# Patient Record
Sex: Female | Born: 1960 | Race: White | Hispanic: No | Marital: Married | Smoking: Former smoker
Health system: Southern US, Community
[De-identification: ages and names within clinical notes are randomized; demographics above are authoritative.]

## PROBLEM LIST (undated history)

## (undated) DIAGNOSIS — F419 Anxiety disorder, unspecified: Secondary | ICD-10-CM

## (undated) DIAGNOSIS — F329 Major depressive disorder, single episode, unspecified: Secondary | ICD-10-CM

## (undated) DIAGNOSIS — Z436 Encounter for attention to other artificial openings of urinary tract: Secondary | ICD-10-CM

## (undated) DIAGNOSIS — F32A Depression, unspecified: Secondary | ICD-10-CM

## (undated) DIAGNOSIS — E079 Disorder of thyroid, unspecified: Secondary | ICD-10-CM

## (undated) DIAGNOSIS — M1990 Unspecified osteoarthritis, unspecified site: Secondary | ICD-10-CM

## (undated) DIAGNOSIS — F4310 Post-traumatic stress disorder, unspecified: Secondary | ICD-10-CM

## (undated) DIAGNOSIS — D179 Benign lipomatous neoplasm, unspecified: Secondary | ICD-10-CM

## (undated) DIAGNOSIS — B1920 Unspecified viral hepatitis C without hepatic coma: Secondary | ICD-10-CM

## (undated) DIAGNOSIS — F1910 Other psychoactive substance abuse, uncomplicated: Secondary | ICD-10-CM

## (undated) DIAGNOSIS — F410 Panic disorder [episodic paroxysmal anxiety] without agoraphobia: Secondary | ICD-10-CM

## (undated) DIAGNOSIS — G8929 Other chronic pain: Secondary | ICD-10-CM

## (undated) HISTORY — DX: Disorder of thyroid, unspecified: E07.9

## (undated) HISTORY — DX: Depression, unspecified: F32.A

## (undated) HISTORY — DX: Other chronic pain: G89.29

## (undated) HISTORY — DX: Other psychoactive substance abuse, uncomplicated: F19.10

## (undated) HISTORY — DX: Unspecified viral hepatitis C without hepatic coma: B19.20

## (undated) HISTORY — DX: Anxiety disorder, unspecified: F41.9

## (undated) HISTORY — PX: ABDOMINAL HYSTERECTOMY: SHX81

## (undated) HISTORY — PX: HERNIA REPAIR: SHX51

## (undated) HISTORY — PX: OTHER SURGICAL HISTORY: SHX169

## (undated) HISTORY — DX: Post-traumatic stress disorder, unspecified: F43.10

## (undated) HISTORY — DX: Panic disorder (episodic paroxysmal anxiety): F41.0

## (undated) HISTORY — DX: Major depressive disorder, single episode, unspecified: F32.9

## (undated) HISTORY — PX: ABDOMINAL SURGERY: SHX537

## (undated) HISTORY — DX: Unspecified osteoarthritis, unspecified site: M19.90

## (undated) HISTORY — PX: ILEO LOOP CONDUIT: SHX1779

## (undated) HISTORY — PX: CHOLECYSTECTOMY: SHX55

## (undated) HISTORY — PX: VAGINA RECONSTRUCTION SURGERY: SHX828

## (undated) HISTORY — DX: Benign lipomatous neoplasm, unspecified: D17.9

---
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
## 2011-12-01 ENCOUNTER — Encounter (HOSPITAL_COMMUNITY): Payer: Self-pay | Admitting: *Deleted

## 2011-12-01 ENCOUNTER — Emergency Department (HOSPITAL_COMMUNITY)
Admission: EM | Admit: 2011-12-01 | Discharge: 2011-12-01 | Disposition: A | Discharge status: Home / Self Care | Payer: Medicare Other | Attending: Emergency Medicine | Admitting: Emergency Medicine

## 2011-12-01 DIAGNOSIS — G8929 Other chronic pain: Secondary | ICD-10-CM | POA: Insufficient documentation

## 2011-12-01 DIAGNOSIS — Z90710 Acquired absence of both cervix and uterus: Secondary | ICD-10-CM | POA: Diagnosis not present

## 2011-12-01 DIAGNOSIS — F17200 Nicotine dependence, unspecified, uncomplicated: Secondary | ICD-10-CM | POA: Insufficient documentation

## 2011-12-01 DIAGNOSIS — N949 Unspecified condition associated with female genital organs and menstrual cycle: Secondary | ICD-10-CM | POA: Diagnosis not present

## 2011-12-01 HISTORY — DX: Encounter for attention to other artificial openings of urinary tract: Z43.6

## 2011-12-01 MED ORDER — HYDROCODONE-ACETAMINOPHEN 10-325 MG PO TABS: 1.0000 | ORAL_TABLET | Freq: Four times a day (QID) | ORAL | Status: AC | PRN

## 2011-12-01 MED ORDER — HYDROCODONE-ACETAMINOPHEN 5-325 MG PO TABS
2.0000 | ORAL_TABLET | Freq: Once | ORAL | Status: AC
  Administered 2011-12-01: 2 via ORAL
  Filled 2011-12-01: qty 2

## 2011-12-01 NOTE — ED Provider Notes (Signed)
History     CSN: [id number]  Arrival date & time 12/01/11  0436   First MD Initiated Contact with Patient 12/01/11 [phone]      Chief Complaint  Patient presents with  . Pelvic Pain    (Consider location/radiation/quality/duration/timing/severity/associated sxs/prior treatment) HPI  Kathleen Palmer is a 51 y.o. female who presents to the Emergency Department complaining of chronic pelvic pain. Patient has moved to [place] from [place] and is unable to find a doctor. 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 She is on hydrocodone 10/325, #6 a day. She had a month's supply of medicine when she arrived in [place] which is now finished.  No PCP  Past Medical History  Diagnosis Date  . Attention to urostomy     Past Surgical History  Procedure Date  . Abdominal surgery   . Abdominal hysterectomy     History reviewed. No pertinent family history.  History  Substance Use Topics  . Smoking status: Current Some Day Smoker -- 0.5 packs/day  . Smokeless tobacco: Not on file  . Alcohol Use: No    OB History    Grav Para Term Preterm Abortions TAB SAB Ect Mult Living                  Review of Systems  Constitutional: Negative for fever.       10 Systems reviewed and are negative for acute change except as noted in the HPI.  HENT: Negative for congestion.   Eyes: Negative for discharge and redness.  Respiratory: Negative for cough and shortness of breath.   Cardiovascular: Negative for chest pain.  Gastrointestinal: Negative for vomiting and abdominal pain.  Genitourinary: Positive for pelvic pain.  Musculoskeletal: Negative for back pain.  Skin: Negative for rash.  Neurological: Negative for syncope, numbness and headaches.  Psychiatric/Behavioral:       No behavior change.    Allergies  Ampicillin; Bactrim; and Gabapentin  Home Medications   Current Outpatient Rx  Name Route Sig Dispense Refill  . ALPRAZOLAM 1 MG PO TABS Oral Take 1 mg by mouth 3 (three) times daily as  needed.    Marland Kitchen FLUOXETINE HCL 20 MG PO TABS Oral Take 60 mg by mouth daily.    Marland Kitchen HYDROCODONE-ACETAMINOPHEN 10-325 MG PO TABS Oral Take 1 tablet by mouth every 4 (four) hours as needed.    Marland Kitchen MIRTAZAPINE 15 MG PO TABS Oral Take 15 mg by mouth at bedtime.      BP 145/86  Pulse 63  Temp 98.3 F (36.8 C) (Oral)  Resp 18  Ht 5\' 3"  (1.6 m)  Wt 192 lb (87.091 kg)  BMI 34.01 kg/m2  SpO2 98%  Physical Exam  Nursing note and vitals reviewed. Constitutional:       Awake, alert, nontoxic appearance.  HENT:  Head: Atraumatic.  Eyes: Right eye exhibits no discharge. Left eye exhibits no discharge.  Neck: Neck supple.  Cardiovascular: Normal heart sounds.   Pulmonary/Chest: Effort normal and breath sounds normal. She exhibits no tenderness.  Abdominal: Soft. There is no tenderness. There is no rebound.  Musculoskeletal: She exhibits no tenderness.       Baseline ROM, no obvious new focal weakness.  Neurological:       Mental status and motor strength appears baseline for patient and situation.  Skin: No rash noted.  Psychiatric: She has a normal mood and affect.    ED Course  Procedures (including critical care time)  MDM  Patient with chronic pain who has moved from [place], unable to find a doctor, out of medications. Analgesics given , Rx written. Pt stable in ED with no significant deterioration in condition.The patient appears reasonably screened and/or stabilized for discharge and I doubt any other medical condition or other [facility] requiring further screening, evaluation, or treatment in the ED at this time prior to discharge.  MDM Reviewed: nursing note and vitals           Nicoletta Dress. Colon Branch, MD 12/01/11 0530

## 2011-12-01 NOTE — ED Notes (Signed)
Pt reporting recently moving from [place], and been unable to find physician at that time.  Reports extensive medical history and chronic pain.  Pt reports she did have 1 month of pain medication but has been unable to see physician in that time.

## 2011-12-22 ENCOUNTER — Encounter (HOSPITAL_COMMUNITY): Payer: Self-pay | Admitting: *Deleted

## 2011-12-22 ENCOUNTER — Emergency Department (HOSPITAL_COMMUNITY)
Admission: EM | Admit: 2011-12-22 | Discharge: 2011-12-22 | Disposition: A | Discharge status: Home / Self Care | Payer: Medicare Other | Attending: Emergency Medicine | Admitting: Emergency Medicine

## 2011-12-22 DIAGNOSIS — N949 Unspecified condition associated with female genital organs and menstrual cycle: Secondary | ICD-10-CM | POA: Diagnosis not present

## 2011-12-22 DIAGNOSIS — R109 Unspecified abdominal pain: Secondary | ICD-10-CM | POA: Diagnosis not present

## 2011-12-22 DIAGNOSIS — F17200 Nicotine dependence, unspecified, uncomplicated: Secondary | ICD-10-CM | POA: Diagnosis not present

## 2011-12-22 DIAGNOSIS — G8929 Other chronic pain: Secondary | ICD-10-CM | POA: Insufficient documentation

## 2011-12-22 MED ORDER — HYDROCODONE-ACETAMINOPHEN 10-500 MG PO TABS: 1.0000 | ORAL_TABLET | Freq: Four times a day (QID) | ORAL | Status: AC | PRN

## 2011-12-22 MED ORDER — HYDROCODONE-ACETAMINOPHEN 5-500 MG PO TABS: 1.0000 | ORAL_TABLET | Freq: Four times a day (QID) | ORAL | Status: DC | PRN

## 2011-12-22 MED ORDER — MORPHINE SULFATE 10 MG/ML IJ SOLN
10.0000 mg | Freq: Once | INTRAMUSCULAR | Status: AC
  Administered 2011-12-22: 10 mg via INTRAMUSCULAR
  Filled 2011-12-22: qty 1

## 2011-12-22 MED ORDER — PROMETHAZINE HCL 25 MG/ML IJ SOLN
25.0000 mg | Freq: Once | INTRAMUSCULAR | Status: AC
  Administered 2011-12-22: 25 mg via INTRAMUSCULAR
  Filled 2011-12-22: qty 1

## 2011-12-22 NOTE — ED Notes (Addendum)
Patient with no complaints at this time. Respirations even and unlabored. Skin warm/dry. Discharge instructions reviewed with patient at this time. Patient given opportunity to voice concerns/ask questions.atient discharged at this time and left Emergency Department with steady gait.   

## 2011-12-22 NOTE — ED Provider Notes (Signed)
History  This chart was scribed for Geoffery Lyons, MD by Shari Heritage. The patient was seen in room APA09/APA09. [name] care was started at 1325.     CSN: [id number]  Arrival date & time 12/22/11  1325   First MD Initiated Contact with Patient 12/22/11 1355      Chief Complaint  Patient presents with  . Pelvic Pain    The history is provided by the patient and a friend. No language interpreter was used.   RANIYAH CURENTON is a 51 y.o. female who presents to the Emergency Department complaining of severe, constant pelvic pain. 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 She has had multiple abdominal surgeries. Patient recently moved to [place] from [place], and doesn't have a local PMD. Patient is on a waiting list with Dr. Margo Aye. Patient is a current some day smoker.  Patient was last seen in the ED on 12/05/2011 by Dr. Greta Doom for the same. Per ED chart note, she had a month's supply of medicine upon arrival in the ED, but ran out. Patient was screened and discharged in stable condition with prescription for 90 tablets of Norco 10-325 mg.   Past Medical History  Diagnosis Date  . Attention to urostomy     Past Surgical History  Procedure Date  . Abdominal surgery   . Abdominal hysterectomy     History reviewed. No pertinent family history.  History  Substance Use Topics  . Smoking status: Current Some Day Smoker -- 0.5 packs/day    Types: Cigarettes  . Smokeless tobacco: Not on file  . Alcohol Use: No    OB History    Grav Para Term Preterm Abortions TAB SAB Ect Mult Living                  Review of Systems  Genitourinary: Positive for pelvic pain.  All other systems reviewed and are negative.    Allergies  Ampicillin; Bactrim; and Gabapentin  Home Medications   Current Outpatient Rx  Name Route Sig Dispense Refill  . ALPRAZOLAM 1 MG PO TABS Oral Take 1 mg by mouth 3 (three) times daily as needed.    Marland Kitchen FLUOXETINE  HCL 20 MG PO TABS Oral Take 60 mg by mouth daily.    Marland Kitchen HYDROCODONE-ACETAMINOPHEN 10-325 MG PO TABS Oral Take 1 tablet by mouth every 4 (four) hours as needed.    Marland Kitchen MIRTAZAPINE 15 MG PO TABS Oral Take 15 mg by mouth at bedtime.      BP 123/80  Pulse 80  Temp 97.9 F (36.6 C) (Oral)  Resp 20  Ht 5\' 3"  (1.6 m)  Wt 193 lb (87.544 kg)  BMI 34.19 kg/m2  SpO2 98%  Physical Exam  Constitutional: She is oriented to person, place, and time. She appears well-developed and well-nourished.       Patient was tearful.  HENT:  Head: Normocephalic and atraumatic.  Pulmonary/Chest: Effort normal.  Abdominal:       Patient has urostomy bag in place. Several abdominal surgical scars present.  Neurological: She is alert and oriented to person, place, and time.  Skin: Skin is warm and dry.  Psychiatric: She has a normal mood and affect. Her behavior is normal.    ED Course  Procedures (including critical care time) DIAGNOSTIC STUDIES: Oxygen Saturation is 98% on room air, normal by my interpretation.    COORDINATION OF CARE: 1:59pm - Patient informed of current plan for  treatment and evaluation and agrees with plan at this time. Patient requesting pain medications for chronic pain. She states she is unable to get an appointment with PCP. Will discharge patient with prescription for pain meds. Advised patient of resources that can help connect her with a new family doctor.    Labs Reviewed - No data to display  No results found.   No diagnosis found.    MDM  This patient presents with chronic pelvic pain.  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  She has had multiple surgeries to correct this and has a urostomy tube in place.  She is new to the area from [place] and has been unable to secure primary care.  She was seen here several weeks ago and given her hydrocodone that she has taken for years.  She is now out of this and wants more.  They tell me they went to Dr. Letitia Neri  office this morning and he "threw them out".  I have prescribed her 30 10-500 hydrocodone and have informed her that she absolutely must obtain a pcp to prescribe her medications and that we will not prescribe these from the ER any more.  She understands this.  She was also given the resource guide and instructed to call the number that provides assistance with locating a pcp.        I personally performed the services described in this documentation, which was scribed in my presence. The recorded information has been reviewed and considered.      Geoffery Lyons, MD 12/23/11 [phone]

## 2011-12-22 NOTE — ED Notes (Addendum)
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  She recently moved to area from [place].  She ran out of pain medication 4 days ago and was to see new MD today to get established and get new Rx for meds, but states MD was very rude to her.  She presents today for assistance w/chronic pain.

## 2011-12-22 NOTE — ED Notes (Signed)
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

## 2011-12-31 DIAGNOSIS — F411 Generalized anxiety disorder: Secondary | ICD-10-CM | POA: Diagnosis not present

## 2011-12-31 DIAGNOSIS — F315 Bipolar disorder, current episode depressed, severe, with psychotic features: Secondary | ICD-10-CM | POA: Diagnosis not present

## 2011-12-31 DIAGNOSIS — E890 Postprocedural hypothyroidism: Secondary | ICD-10-CM | POA: Diagnosis not present

## 2011-12-31 DIAGNOSIS — M25559 Pain in unspecified hip: Secondary | ICD-10-CM | POA: Diagnosis not present

## 2012-01-13 ENCOUNTER — Ambulatory Visit (INDEPENDENT_AMBULATORY_CARE_PROVIDER_SITE_OTHER): Payer: Medicare Other | Admitting: Psychiatry

## 2012-01-13 ENCOUNTER — Encounter (HOSPITAL_COMMUNITY): Payer: Self-pay | Admitting: Psychiatry

## 2012-01-13 DIAGNOSIS — F1210 Cannabis abuse, uncomplicated: Secondary | ICD-10-CM

## 2012-01-13 DIAGNOSIS — Z79899 Other long term (current) drug therapy: Secondary | ICD-10-CM | POA: Diagnosis not present

## 2012-01-13 DIAGNOSIS — F329 Major depressive disorder, single episode, unspecified: Secondary | ICD-10-CM

## 2012-01-13 DIAGNOSIS — F39 Unspecified mood [affective] disorder: Secondary | ICD-10-CM

## 2012-01-13 DIAGNOSIS — F4310 Post-traumatic stress disorder, unspecified: Secondary | ICD-10-CM

## 2012-01-13 LAB — CBC WITH DIFFERENTIAL/PLATELET
Hemoglobin: 14.5 g/dL (ref 12.0–15.0)
Lymphocytes Relative: 22 % (ref 12–46)
Lymphs Abs: 1.7 10*3/uL (ref 0.7–4.0)
MCV: 84.1 fL (ref 78.0–100.0)
Monocytes Relative: 5 % (ref 3–12)
Neutrophils Relative %: 73 % (ref 43–77)
Platelets: 226 10*3/uL (ref 150–400)
RBC: 4.98 MIL/uL (ref 3.87–5.11)
WBC: 7.9 10*3/uL (ref 4.0–10.5)

## 2012-01-13 LAB — HEMOGLOBIN A1C: Hgb A1c MFr Bld: 5.8 % — ABNORMAL HIGH (ref <5.7)

## 2012-01-13 MED ORDER — CLONAZEPAM 1 MG PO TABS: 1.0000 mg | ORAL_TABLET | Freq: Two times a day (BID) | ORAL | Status: DC | PRN

## 2012-01-13 MED ORDER — LAMOTRIGINE 25 MG PO TABS: ORAL_TABLET | ORAL | Status: DC
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
## 2012-01-14 LAB — COMPREHENSIVE METABOLIC PANEL
ALT: 55 U/L — ABNORMAL HIGH (ref 0–35)
Albumin: 4.7 g/dL (ref 3.5–5.2)
CO2: 28 mEq/L (ref 19–32)
Calcium: 9.8 mg/dL (ref 8.4–10.5)
Chloride: 103 mEq/L (ref 96–112)
Sodium: 139 mEq/L (ref 135–145)
Total Protein: 7.5 g/dL (ref 6.0–8.3)

## 2012-01-20 ENCOUNTER — Ambulatory Visit (INDEPENDENT_AMBULATORY_CARE_PROVIDER_SITE_OTHER): Payer: Medicare Other | Admitting: Psychiatry

## 2012-01-20 ENCOUNTER — Encounter (HOSPITAL_COMMUNITY): Payer: Self-pay | Admitting: Psychiatry

## 2012-01-20 DIAGNOSIS — F39 Unspecified mood [affective] disorder: Secondary | ICD-10-CM | POA: Diagnosis not present

## 2012-01-28 ENCOUNTER — Ambulatory Visit (INDEPENDENT_AMBULATORY_CARE_PROVIDER_SITE_OTHER): Payer: Medicare Other | Admitting: Psychiatry

## 2012-01-28 DIAGNOSIS — F39 Unspecified mood [affective] disorder: Secondary | ICD-10-CM | POA: Diagnosis not present
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
## 2012-02-02 NOTE — Patient Instructions (Signed)
Discussed orally 
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
## 2012-02-03 ENCOUNTER — Encounter (HOSPITAL_COMMUNITY): Payer: Self-pay | Admitting: Psychiatry

## 2012-02-03 ENCOUNTER — Ambulatory Visit (INDEPENDENT_AMBULATORY_CARE_PROVIDER_SITE_OTHER): Payer: Medicare Other | Admitting: Psychiatry

## 2012-02-03 DIAGNOSIS — F609 Personality disorder, unspecified: Secondary | ICD-10-CM

## 2012-02-03 DIAGNOSIS — F39 Unspecified mood [affective] disorder: Secondary | ICD-10-CM

## 2012-02-03 DIAGNOSIS — F329 Major depressive disorder, single episode, unspecified: Secondary | ICD-10-CM

## 2012-02-03 DIAGNOSIS — F1210 Cannabis abuse, uncomplicated: Secondary | ICD-10-CM

## 2012-02-03 MED ORDER — LAMOTRIGINE 25 MG PO TABS: ORAL_TABLET | ORAL | Status: DC

## 2012-02-03 MED ORDER — CLONAZEPAM 1 MG PO TABS: 1.0000 mg | ORAL_TABLET | Freq: Two times a day (BID) | ORAL | Status: DC | PRN
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
## 2012-02-06 ENCOUNTER — Encounter: Payer: Self-pay | Admitting: Family Medicine

## 2012-02-06 ENCOUNTER — Ambulatory Visit (INDEPENDENT_AMBULATORY_CARE_PROVIDER_SITE_OTHER): Payer: 59 | Admitting: Family Medicine

## 2012-02-06 VITALS — BP 132/76 | HR 73 | Resp 18 | Ht 64.0 in | Wt 203.0 lb

## 2012-02-06 DIAGNOSIS — N820 Vesicovaginal fistula: Secondary | ICD-10-CM | POA: Insufficient documentation

## 2012-02-06 DIAGNOSIS — F32A Depression, unspecified: Secondary | ICD-10-CM

## 2012-02-06 DIAGNOSIS — Z932 Ileostomy status: Secondary | ICD-10-CM | POA: Insufficient documentation

## 2012-02-06 DIAGNOSIS — F3289 Other specified depressive episodes: Secondary | ICD-10-CM

## 2012-02-06 DIAGNOSIS — E038 Other specified hypothyroidism: Secondary | ICD-10-CM | POA: Insufficient documentation

## 2012-02-06 DIAGNOSIS — F329 Major depressive disorder, single episode, unspecified: Secondary | ICD-10-CM | POA: Insufficient documentation

## 2012-02-06 DIAGNOSIS — Z9889 Other specified postprocedural states: Secondary | ICD-10-CM | POA: Insufficient documentation

## 2012-02-06 DIAGNOSIS — E039 Hypothyroidism, unspecified: Secondary | ICD-10-CM | POA: Diagnosis not present

## 2012-02-06 DIAGNOSIS — Q6410 Exstrophy of urinary bladder, unspecified: Secondary | ICD-10-CM | POA: Insufficient documentation

## 2012-02-06 DIAGNOSIS — L255 Unspecified contact dermatitis due to plants, except food: Secondary | ICD-10-CM

## 2012-02-06 DIAGNOSIS — G894 Chronic pain syndrome: Secondary | ICD-10-CM | POA: Insufficient documentation

## 2012-02-06 DIAGNOSIS — B1920 Unspecified viral hepatitis C without hepatic coma: Secondary | ICD-10-CM | POA: Diagnosis not present

## 2012-02-06 DIAGNOSIS — N821 Other female urinary-genital tract fistulae: Secondary | ICD-10-CM

## 2012-02-06 DIAGNOSIS — F4310 Post-traumatic stress disorder, unspecified: Secondary | ICD-10-CM

## 2012-02-06 DIAGNOSIS — L237 Allergic contact dermatitis due to plants, except food: Secondary | ICD-10-CM

## 2012-02-06 MED ORDER — OXYCODONE HCL 5 MG PO TABS: 5.0000 mg | ORAL_TABLET | ORAL | Status: DC | PRN

## 2012-02-06 MED ORDER — PREDNISONE 10 MG PO TABS: ORAL_TABLET | ORAL | Status: DC

## 2012-02-06 MED ORDER — SODIUM CHLORIDE 0.9 % IV SOLN
125.0000 mg | Freq: Once | INTRAVENOUS | Status: AC
  Administered 2012-02-06: 130 mg via INTRAMUSCULAR

## 2012-02-06 NOTE — Assessment & Plan Note (Signed)
Continue synthroid.

## 2012-02-06 NOTE — Assessment & Plan Note (Signed)
Will place on pain contract, oxycodone 5 mg #120 given

## 2012-02-06 NOTE — Assessment & Plan Note (Signed)
No previous treatment, obtain records, mild elevation in LEFT, she will need to be seen by GI once other acute issues resolved

## 2012-02-06 NOTE — Assessment & Plan Note (Signed)
Will refer to [facility] center, as she will need specialized tertiary care because of her previous history and interventions. Records to be obtained

## 2012-02-06 NOTE — Progress Notes (Signed)
  Subjective:    Patient ID: Kathleen Palmer, female    DOB: 03/25/1961, 51 y.o.   MRN: [id number]  HPI  Patient here to establish care. She was previously under the care of Dr. Mikael Spray at [facility] in [place] she was also been seen by [facility]ology and urology. She relocated to [facility] 3 months ago to join her twin sister. She's here with her husband. 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 She's had multiple abdominal surgeries for hernia gallbladder removal as well as pelvic surgery for vaginal reconstruction.  She was being followed by [facility] secondary to purulent drainage at the site of her vaginal reconstruction. She tells me she has had purulent drainage from for the past 2-3 years. She's had some workup however this continues to bother her she had a CT scan done in August of 2012 which showed concern for possible fistula however no intervention was performed at that time. 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 She occasionally sees air bubbles in her ostomy. She also has an extensive psychiatric history. She has a strong family history of depression and psychiatric illness. Her mother committed suicide. She is a twin sister who also attempted suicide. She has multiple family members who have been admitted to psychiatric ward for treatment. She has long-standing history of PTSD, depression, anxiety and possible bipolar which is being worked up currently. She's currently being followed by come behavioral health here in [place]. She has history of substance abuse including long-term history  of alcohol abuse which she has been alcohol free since 2004. 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 This is the time she believes she contracted hepatitis C. She was recently seen by her psychiatrist to advised her to change her narcotic medication as his routine labs showed an elevation in her liver enzymes we have no comparable studies at this time. She was exposed to poison oak 1 week ago has been using topical cream but this has not helped.    Review of Systems - per above  GEN- denies fatigue, fever, weight loss,weakness, recent illness HEENT- denies eye drainage, change in vision, nasal discharge, CVS- denies chest pain, palpitations RESP- denies SOB, cough, wheeze ABD- denies N/V, change in stools,+ abd pain GU- denies dysuria, hematuria, dribbling, incontinence MSK- + joint pain, muscle aches, injury Neuro- denies headache, dizziness, syncope, seizure activity      Objective:   Physical Exam GEN- NAD, alert and oriented x3 HEENT- PERRL, EOMI, non injected sclera, pink conjunctiva, MMM, oropharynx clear Neck- Supple,  CVS- RRR, no murmur RESP-CTAB ABD-NABS,soft, tenderness around illeoconduit osteomy, Pelvis- deformity of right hip EXT- No edema Pulses- Radial, DP- 2+ Psych-normal affect and Mood Skin- erythematous macular rash with exoriations scattered across, abdomen and bilateral arms       Assessment & Plan:

## 2012-02-06 NOTE — Patient Instructions (Signed)
Start prednisone tomorrow Steroid shot given in office Oxycodone for pain,- pain contract  Continue other medications Referral to [facility]  Nice to meet you! F/U 8 weeks

## 2012-02-06 NOTE — Assessment & Plan Note (Signed)
Treat with IM steroids in office followed by steroid burst

## 2012-02-06 NOTE — Assessment & Plan Note (Signed)
Followed by psychiatry 

## 2012-02-06 NOTE — Assessment & Plan Note (Signed)
Will refer to [facility] for re-evaluation of ostomy and pt concerns

## 2012-02-11 ENCOUNTER — Ambulatory Visit (INDEPENDENT_AMBULATORY_CARE_PROVIDER_SITE_OTHER): Payer: Medicare Other | Admitting: Psychiatry

## 2012-02-11 DIAGNOSIS — F39 Unspecified mood [affective] disorder: Secondary | ICD-10-CM

## 2012-02-11 NOTE — Patient Instructions (Signed)
Discussed orally 
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
## 2012-02-20 ENCOUNTER — Telehealth (HOSPITAL_COMMUNITY): Payer: Self-pay | Admitting: *Deleted

## 2012-02-20 ENCOUNTER — Other Ambulatory Visit (HOSPITAL_COMMUNITY): Payer: Self-pay | Admitting: Psychiatry

## 2012-02-20 NOTE — Telephone Encounter (Signed)
Call returned.  Patient complained of chest pressure and tingling in her tongue.  She believe she has a panic attack.  I recommend to go emergency room to rule out cardiac cause.  Patient acknowledged  She is going emergency room for further evaluation.

## 2012-02-25 ENCOUNTER — Telehealth: Payer: Self-pay | Admitting: Family Medicine

## 2012-02-25 ENCOUNTER — Ambulatory Visit (INDEPENDENT_AMBULATORY_CARE_PROVIDER_SITE_OTHER): Payer: Medicare Other | Admitting: Psychiatry

## 2012-02-25 DIAGNOSIS — F39 Unspecified mood [affective] disorder: Secondary | ICD-10-CM
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
## 2012-02-25 NOTE — Patient Instructions (Signed)
Discussed orally 

## 2012-02-26 NOTE — Telephone Encounter (Signed)
noted 

## 2012-03-01 DIAGNOSIS — N8111 Cystocele, midline: Secondary | ICD-10-CM | POA: Diagnosis not present

## 2012-03-01 DIAGNOSIS — N949 Unspecified condition associated with female genital organs and menstrual cycle: Secondary | ICD-10-CM | POA: Diagnosis not present

## 2012-03-01 DIAGNOSIS — IMO0002 Reserved for concepts with insufficient information to code with codable children: Secondary | ICD-10-CM | POA: Diagnosis not present

## 2012-03-01 DIAGNOSIS — N952 Postmenopausal atrophic vaginitis: Secondary | ICD-10-CM | POA: Diagnosis not present

## 2012-03-05 ENCOUNTER — Ambulatory Visit (INDEPENDENT_AMBULATORY_CARE_PROVIDER_SITE_OTHER): Payer: Medicare Other | Admitting: Psychiatry

## 2012-03-05 ENCOUNTER — Encounter (HOSPITAL_COMMUNITY): Payer: Self-pay | Admitting: Psychiatry

## 2012-03-05 ENCOUNTER — Telehealth: Payer: Self-pay | Admitting: Family Medicine

## 2012-03-05 VITALS — BP 144/94 | HR 104 | Ht 62.5 in | Wt 197.2 lb

## 2012-03-05 DIAGNOSIS — F39 Unspecified mood [affective] disorder: Secondary | ICD-10-CM

## 2012-03-05 DIAGNOSIS — F5105 Insomnia due to other mental disorder: Secondary | ICD-10-CM | POA: Insufficient documentation

## 2012-03-05 DIAGNOSIS — F329 Major depressive disorder, single episode, unspecified: Secondary | ICD-10-CM

## 2012-03-05 DIAGNOSIS — F1210 Cannabis abuse, uncomplicated: Secondary | ICD-10-CM

## 2012-03-05 DIAGNOSIS — F4310 Post-traumatic stress disorder, unspecified: Secondary | ICD-10-CM

## 2012-03-05 MED ORDER — PROPRANOLOL HCL 10 MG PO TABS: 10.0000 mg | ORAL_TABLET | Freq: Four times a day (QID) | ORAL | Status: DC

## 2012-03-05 MED ORDER — ALPRAZOLAM 1 MG PO TABS: 1.0000 mg | ORAL_TABLET | Freq: Three times a day (TID) | ORAL | Status: DC | PRN

## 2012-03-05 MED ORDER — MIRTAZAPINE 15 MG PO TABS
15.0000 mg | ORAL_TABLET | Freq: Every day | ORAL | Status: DC
Start: 2012-03-05 — End: 2012-05-03

## 2012-03-05 MED ORDER — FLUOXETINE HCL 20 MG PO TABS: 40.0000 mg | ORAL_TABLET | Freq: Every day | ORAL | Status: DC

## 2012-03-05 NOTE — Patient Instructions (Addendum)
Will return to the regimen that worked in [place].  Will add Inderal and then keep the idea of brain recovery in mind for later.  I encourage pt to start attending [facility] Family Groups and to get a sponsor here in [place] in her AA.  Also discussed aromatherapy, music therapy, and massage and accupunture for her anxiety and pain mamagement.  Discussed Native Wisdom and Red Road to [place] for her.  "Native Wisdom for [employer] by Camelia Phenes could be very helpful.  "The Red Road to [place]" compiled by [employer] could be helpful.  Strongly consider attending at least 6 Alanon Meetings to help you learn about how your helping others to the exclusion of helping yourself is actually hurting yourself and is actually an addiction to fixing others and that you need to work the 12 Step to Happiness through the [employer]. Al-Anon Family Groups could be helpful with how to deal with substance abusing family and friends. Or your own issues of being in victim role.  There are only 40 Alanon Family Group meetings a week here in [place].  Online are current listing of those meetings @ [email or web address].[email or web address]/meetings.[email or web address]  There are [employer].  Search on line and there you can learn the format and can access the schedule for yourself.  Their number is [phone]  Switch Prozac to evening for better effect to wake up with.

## 2012-03-05 NOTE — Telephone Encounter (Signed)
Will be ready to collect on Monday
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
## 2012-03-08 ENCOUNTER — Other Ambulatory Visit: Payer: Self-pay

## 2012-03-08 MED ORDER — HYDROCODONE-ACETAMINOPHEN 10-325 MG PO TABS: 1.0000 | ORAL_TABLET | Freq: Three times a day (TID) | ORAL | Status: DC | PRN

## 2012-03-08 MED ORDER — OXYCODONE HCL 5 MG PO TABS: 5.0000 mg | ORAL_TABLET | ORAL | Status: DC | PRN

## 2012-03-08 NOTE — Addendum Note (Signed)
Addended by: Milinda Antis F on: 03/08/2012 12:23 PM   Modules accepted: Orders

## 2012-03-08 NOTE — Telephone Encounter (Signed)
Oxycodone discontinued, Norco given, she has Hep C, so will limit tylenol use

## 2012-03-08 NOTE — Telephone Encounter (Signed)
Will fax to [facility]

## 2012-03-08 NOTE — Telephone Encounter (Signed)
States the oxycodone gives her anxiety. Per her psych she needs to go back on the hydrocodone 10/325.

## 2012-03-16 ENCOUNTER — Ambulatory Visit (HOSPITAL_COMMUNITY): Payer: Self-pay | Admitting: Psychiatry

## 2012-03-17 ENCOUNTER — Telehealth (HOSPITAL_COMMUNITY): Payer: Self-pay | Admitting: Psychiatry

## 2012-03-17 ENCOUNTER — Ambulatory Visit (INDEPENDENT_AMBULATORY_CARE_PROVIDER_SITE_OTHER): Payer: Medicare Other | Admitting: Psychiatry

## 2012-03-17 DIAGNOSIS — F39 Unspecified mood [affective] disorder: Secondary | ICD-10-CM

## 2012-03-17 DIAGNOSIS — F4310 Post-traumatic stress disorder, unspecified: Secondary | ICD-10-CM

## 2012-03-17 MED ORDER — ALPRAZOLAM 1 MG PO TABS: 1.0000 mg | ORAL_TABLET | Freq: Three times a day (TID) | ORAL | Status: DC | PRN

## 2012-03-17 NOTE — Telephone Encounter (Signed)
Mistakenly renewed last Xanax with only 60 tabs.'  Will rewrite with 30.
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
## 2012-03-17 NOTE — Addendum Note (Signed)
Addended by: Mike Craze on: 03/17/2012 12:58 PM   Modules accepted: Orders

## 2012-03-17 NOTE — Patient Instructions (Signed)
Discussed orally 

## 2012-03-17 NOTE — Telephone Encounter (Signed)
Called pt with infor that Xanax rewritten.  She plans to keep next appointment.

## 2012-03-30 ENCOUNTER — Ambulatory Visit (INDEPENDENT_AMBULATORY_CARE_PROVIDER_SITE_OTHER): Payer: Medicare Other | Admitting: Psychiatry

## 2012-03-30 DIAGNOSIS — F39 Unspecified mood [affective] disorder: Secondary | ICD-10-CM | POA: Diagnosis not present

## 2012-03-31 NOTE — Patient Instructions (Signed)
Discussed orally 
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
## 2012-04-01 DIAGNOSIS — N949 Unspecified condition associated with female genital organs and menstrual cycle: Secondary | ICD-10-CM | POA: Diagnosis not present

## 2012-04-01 DIAGNOSIS — Q6410 Exstrophy of urinary bladder, unspecified: Secondary | ICD-10-CM | POA: Diagnosis not present

## 2012-04-01 DIAGNOSIS — IMO0002 Reserved for concepts with insufficient information to code with codable children: Secondary | ICD-10-CM | POA: Diagnosis not present

## 2012-04-06 ENCOUNTER — Ambulatory Visit (INDEPENDENT_AMBULATORY_CARE_PROVIDER_SITE_OTHER): Payer: Medicare Other | Admitting: Family Medicine

## 2012-04-06 ENCOUNTER — Encounter: Payer: Self-pay | Admitting: Family Medicine

## 2012-04-06 VITALS — BP 140/90 | HR 74 | Resp 15 | Ht 64.0 in | Wt 207.0 lb

## 2012-04-06 DIAGNOSIS — Z79899 Other long term (current) drug therapy: Secondary | ICD-10-CM | POA: Diagnosis not present

## 2012-04-06 DIAGNOSIS — R030 Elevated blood-pressure reading, without diagnosis of hypertension: Secondary | ICD-10-CM | POA: Insufficient documentation

## 2012-04-06 DIAGNOSIS — E039 Hypothyroidism, unspecified: Secondary | ICD-10-CM

## 2012-04-06 DIAGNOSIS — B1920 Unspecified viral hepatitis C without hepatic coma: Secondary | ICD-10-CM

## 2012-04-06 DIAGNOSIS — E669 Obesity, unspecified: Secondary | ICD-10-CM

## 2012-04-06 DIAGNOSIS — G894 Chronic pain syndrome: Secondary | ICD-10-CM

## 2012-04-06 MED ORDER — HYDROCODONE-ACETAMINOPHEN 10-325 MG PO TABS: 1.0000 | ORAL_TABLET | Freq: Four times a day (QID) | ORAL | Status: DC | PRN

## 2012-04-06 NOTE — Assessment & Plan Note (Signed)
In the past she will did not want to take interference secondary to her age and other comorbidities at the time. I will check her HCV RNA level as well as her liver function tests.

## 2012-04-06 NOTE — Progress Notes (Signed)
  Subjective:    Patient ID: Kathleen Palmer, female    DOB: 28-Feb-1961, 51 y.o.   MRN: [id number]  HPI Patient here to follow chronic medical problems. She has no specific concerns. She's been under a lot of stress lately as her twin sister was recently admitted to come behavioral health. She's been followed herself by psychiatry and she has an appointment tomorrow. Her medications were adjusted and she feels she is doing okay in lieu of recent stresses with her sister. Her notes from her psychiatrist state she is supposed to be on propanolol however she did not receive a prescription for this.  She was evaluated by GYN and urology at [facility]. She states that she's been referred to [facility] urology for further workup regarding the calcified ureters. She was told that she does not have an active infection.  She is due for blood work. Her records have not been received from [facility] in [place]   Review of Systems  GEN- denies fatigue, fever, weight loss,weakness, recent illness HEENT- denies eye drainage, change in vision, nasal discharge, CVS- denies chest pain, palpitations RESP- denies SOB, cough, wheeze ABD- denies N/V, change in stools,+ abd pain GU- denies dysuria, hematuria, dribbling, incontinence MSK- + joint pain, muscle aches, injury Neuro- denies headache, dizziness, syncope, seizure activity      Objective:   Physical Exam  GEN- NAD, alert and oriented x3 BP 142/84 HEENT- PERRL, EOMI, non injected sclera, pink conjunctiva, MMM, oropharynx clear CVS- RRR, no murmur RESP-CTAB EXT- No edema Pulses- Radial, DP- 2+ Psych-crying discussing her sister, not overly anxious or depressed appearing, smiling at times      Assessment & Plan:

## 2012-04-06 NOTE — Assessment & Plan Note (Signed)
She stopped her medication over the past few months. Discussed the importance of thyroid supplement. I will check her TSH and T4

## 2012-04-06 NOTE — Assessment & Plan Note (Signed)
Elevated blood pressure today without diagnosis of hypertension I will keep and I am this. Patient also advised to check blood pressure at the pharmacy

## 2012-04-06 NOTE — Patient Instructions (Addendum)
Continue current medications Keep an eye on blood pressure  Labs to be done today, nurse will call with results  Pain meds refilled  F/U 3 months

## 2012-04-06 NOTE — Assessment & Plan Note (Addendum)
Serum drug screen to be done. Pain medication was increased to that she can take 4 times a day 120 tablets hydrocodone.  oxycodone cause anxiety

## 2012-04-07 ENCOUNTER — Ambulatory Visit (INDEPENDENT_AMBULATORY_CARE_PROVIDER_SITE_OTHER): Payer: Medicare Other | Admitting: Psychiatry

## 2012-04-07 ENCOUNTER — Encounter (HOSPITAL_COMMUNITY): Payer: Self-pay | Admitting: Psychiatry

## 2012-04-07 VITALS — Wt 203.8 lb

## 2012-04-07 DIAGNOSIS — F4310 Post-traumatic stress disorder, unspecified: Secondary | ICD-10-CM

## 2012-04-07 DIAGNOSIS — R52 Pain, unspecified: Secondary | ICD-10-CM | POA: Insufficient documentation

## 2012-04-07 DIAGNOSIS — F5105 Insomnia due to other mental disorder: Secondary | ICD-10-CM

## 2012-04-07 DIAGNOSIS — F329 Major depressive disorder, single episode, unspecified: Secondary | ICD-10-CM

## 2012-04-07 DIAGNOSIS — F1210 Cannabis abuse, uncomplicated: Secondary | ICD-10-CM

## 2012-04-07 DIAGNOSIS — F39 Unspecified mood [affective] disorder: Secondary | ICD-10-CM

## 2012-04-07 MED ORDER — TRAZODONE HCL 50 MG PO TABS: 50.0000 mg | ORAL_TABLET | Freq: Every day | ORAL | Status: DC

## 2012-04-07 MED ORDER — ALPRAZOLAM 1 MG PO TABS: 1.0000 mg | ORAL_TABLET | Freq: Three times a day (TID) | ORAL | Status: DC | PRN

## 2012-04-07 MED ORDER — FLUOXETINE HCL 20 MG PO TABS: 40.0000 mg | ORAL_TABLET | Freq: Every day | ORAL | Status: DC

## 2012-04-07 NOTE — Patient Instructions (Signed)
Cut back on sugar and carbohydrates, that means very limited fruits and starchy vegetables and very limited grains, breads  The goal is low GLYCEMIC INDEX.  Cut back on all wheat, rye, or barley  Eat avocados, eggs, lean meat like grass fed beef and chicken   Your Christmas presents include "Native Wisdom for [employer] by Camelia Phenes could be very helpful. "The Red Road to [place]" compiled by [employer] could be helpful. Both available from [employer]
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
## 2012-04-16 ENCOUNTER — Ambulatory Visit (HOSPITAL_COMMUNITY): Payer: Self-pay | Admitting: Psychiatry

## 2012-04-23 ENCOUNTER — Ambulatory Visit (HOSPITAL_COMMUNITY): Payer: Self-pay | Admitting: Psychiatry

## 2012-04-30 ENCOUNTER — Ambulatory Visit (HOSPITAL_COMMUNITY): Payer: Self-pay | Admitting: Psychiatry

## 2012-05-03 ENCOUNTER — Telehealth (HOSPITAL_COMMUNITY): Payer: Self-pay | Admitting: Psychiatry

## 2012-05-03 DIAGNOSIS — F5105 Insomnia due to other mental disorder: Secondary | ICD-10-CM

## 2012-05-03 DIAGNOSIS — F329 Major depressive disorder, single episode, unspecified: Secondary | ICD-10-CM

## 2012-05-03 MED ORDER — MIRTAZAPINE 15 MG PO TABS: 15.0000 mg | ORAL_TABLET | Freq: Every day | ORAL | Status: DC

## 2012-05-03 NOTE — Telephone Encounter (Signed)
Refill request approved via eScripts.  

## 2012-05-14 ENCOUNTER — Ambulatory Visit (INDEPENDENT_AMBULATORY_CARE_PROVIDER_SITE_OTHER): Payer: Medicare Other | Admitting: Psychiatry

## 2012-05-14 DIAGNOSIS — F39 Unspecified mood [affective] disorder: Secondary | ICD-10-CM | POA: Diagnosis not present
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
## 2012-05-14 NOTE — Patient Instructions (Signed)
Continue setting and maintaining boundaries  Do activity scheduling and improve routine/structure  Increase self-care efforts regarding exercise within your capability

## 2012-05-19 ENCOUNTER — Ambulatory Visit (HOSPITAL_COMMUNITY): Payer: Self-pay | Admitting: Psychiatry

## 2012-05-28 ENCOUNTER — Ambulatory Visit (INDEPENDENT_AMBULATORY_CARE_PROVIDER_SITE_OTHER): Payer: Medicare Other | Admitting: Psychiatry

## 2012-05-28 DIAGNOSIS — F39 Unspecified mood [affective] disorder: Secondary | ICD-10-CM | POA: Diagnosis not present
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
## 2012-05-28 NOTE — Patient Instructions (Signed)
Discussed orally 

## 2012-05-31 ENCOUNTER — Other Ambulatory Visit: Payer: Self-pay | Admitting: Family Medicine

## 2012-05-31 ENCOUNTER — Telehealth (HOSPITAL_COMMUNITY): Payer: Self-pay | Admitting: Psychiatry

## 2012-06-01 ENCOUNTER — Telehealth (HOSPITAL_COMMUNITY): Payer: Self-pay | Admitting: Psychiatry

## 2012-06-02 ENCOUNTER — Ambulatory Visit (INDEPENDENT_AMBULATORY_CARE_PROVIDER_SITE_OTHER): Payer: Medicare Other | Admitting: Psychiatry

## 2012-06-02 ENCOUNTER — Encounter (HOSPITAL_COMMUNITY): Payer: Self-pay | Admitting: Psychiatry

## 2012-06-02 VITALS — Wt 195.2 lb

## 2012-06-02 DIAGNOSIS — E559 Vitamin D deficiency, unspecified: Secondary | ICD-10-CM | POA: Insufficient documentation

## 2012-06-02 DIAGNOSIS — R52 Pain, unspecified: Secondary | ICD-10-CM

## 2012-06-02 DIAGNOSIS — F329 Major depressive disorder, single episode, unspecified: Secondary | ICD-10-CM

## 2012-06-02 DIAGNOSIS — F39 Unspecified mood [affective] disorder: Secondary | ICD-10-CM

## 2012-06-02 DIAGNOSIS — F1210 Cannabis abuse, uncomplicated: Secondary | ICD-10-CM

## 2012-06-02 DIAGNOSIS — F5105 Insomnia due to other mental disorder: Secondary | ICD-10-CM

## 2012-06-02 DIAGNOSIS — F4310 Post-traumatic stress disorder, unspecified: Secondary | ICD-10-CM

## 2012-06-02 MED ORDER — FLUOXETINE HCL 20 MG PO TABS: 60.0000 mg | ORAL_TABLET | Freq: Every day | ORAL | Status: DC

## 2012-06-02 MED ORDER — ALPRAZOLAM 1 MG PO TABS: 1.0000 mg | ORAL_TABLET | Freq: Three times a day (TID) | ORAL | Status: DC | PRN

## 2012-06-02 MED ORDER — TRAZODONE HCL 100 MG PO TABS: 100.0000 mg | ORAL_TABLET | Freq: Every day | ORAL | Status: DC

## 2012-06-02 NOTE — Patient Instructions (Signed)
CUT BACK/CUT OUT on sugar and carbohydrates, that means very limited fruits and starchy vegetables and very limited grains, breads  The goal is low GLYCEMIC INDEX.  CUT OUT all wheat, rye, or barley for the GLUTEN in them.  HIGH fat and LOW carbohydrate diet is the KEY.  Eat avocados, eggs, lean meat like grass fed beef and chicken  Nuts and seeds would be good foods as well.   Stevia is an excellent sweetener.  Safe for the brain.   Almond butter is awesome.  Check out all this on the Internet.  Call if problems or concerns.  
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
## 2012-06-11 ENCOUNTER — Ambulatory Visit (HOSPITAL_COMMUNITY): Payer: Self-pay | Admitting: Psychiatry

## 2012-06-15 ENCOUNTER — Ambulatory Visit (INDEPENDENT_AMBULATORY_CARE_PROVIDER_SITE_OTHER): Payer: Medicare Other | Admitting: Psychiatry

## 2012-06-15 DIAGNOSIS — F39 Unspecified mood [affective] disorder: Secondary | ICD-10-CM | POA: Diagnosis not present
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
## 2012-06-15 NOTE — Patient Instructions (Signed)
Discussed orally 

## 2012-06-29 ENCOUNTER — Ambulatory Visit (HOSPITAL_COMMUNITY): Payer: Self-pay | Admitting: Psychiatry

## 2012-06-30 ENCOUNTER — Ambulatory Visit (HOSPITAL_COMMUNITY): Payer: Self-pay | Admitting: Psychiatry

## 2012-07-05 ENCOUNTER — Ambulatory Visit: Payer: Medicare Other | Admitting: Family Medicine

## 2012-07-14 ENCOUNTER — Ambulatory Visit (INDEPENDENT_AMBULATORY_CARE_PROVIDER_SITE_OTHER): Payer: Medicare Other | Admitting: Psychiatry

## 2012-07-14 DIAGNOSIS — F4310 Post-traumatic stress disorder, unspecified: Secondary | ICD-10-CM

## 2012-07-14 DIAGNOSIS — F329 Major depressive disorder, single episode, unspecified: Secondary | ICD-10-CM | POA: Diagnosis not present
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
## 2012-07-14 NOTE — Patient Instructions (Signed)
Discussed orally 

## 2012-07-19 ENCOUNTER — Other Ambulatory Visit: Payer: Self-pay | Admitting: Family Medicine

## 2012-07-19 ENCOUNTER — Ambulatory Visit (INDEPENDENT_AMBULATORY_CARE_PROVIDER_SITE_OTHER): Payer: Medicare Other | Admitting: Psychiatry

## 2012-07-19 ENCOUNTER — Ambulatory Visit (INDEPENDENT_AMBULATORY_CARE_PROVIDER_SITE_OTHER): Payer: Medicare Other | Admitting: Family Medicine

## 2012-07-19 ENCOUNTER — Encounter (HOSPITAL_COMMUNITY): Payer: Self-pay | Admitting: Psychiatry

## 2012-07-19 ENCOUNTER — Encounter: Payer: Self-pay | Admitting: Family Medicine

## 2012-07-19 VITALS — Wt 193.2 lb

## 2012-07-19 VITALS — BP 120/80 | HR 84 | Resp 18 | Ht 64.0 in | Wt 193.0 lb

## 2012-07-19 DIAGNOSIS — F1210 Cannabis abuse, uncomplicated: Secondary | ICD-10-CM

## 2012-07-19 DIAGNOSIS — F329 Major depressive disorder, single episode, unspecified: Secondary | ICD-10-CM

## 2012-07-19 DIAGNOSIS — E039 Hypothyroidism, unspecified: Secondary | ICD-10-CM | POA: Diagnosis not present

## 2012-07-19 DIAGNOSIS — Q6410 Exstrophy of urinary bladder, unspecified: Secondary | ICD-10-CM

## 2012-07-19 DIAGNOSIS — F1290 Cannabis use, unspecified, uncomplicated: Secondary | ICD-10-CM

## 2012-07-19 DIAGNOSIS — B1920 Unspecified viral hepatitis C without hepatic coma: Secondary | ICD-10-CM | POA: Diagnosis not present

## 2012-07-19 DIAGNOSIS — F4310 Post-traumatic stress disorder, unspecified: Secondary | ICD-10-CM

## 2012-07-19 DIAGNOSIS — Z79899 Other long term (current) drug therapy: Secondary | ICD-10-CM | POA: Diagnosis not present

## 2012-07-19 DIAGNOSIS — R030 Elevated blood-pressure reading, without diagnosis of hypertension: Secondary | ICD-10-CM

## 2012-07-19 DIAGNOSIS — F5105 Insomnia due to other mental disorder: Secondary | ICD-10-CM

## 2012-07-19 DIAGNOSIS — F32A Depression, unspecified: Secondary | ICD-10-CM

## 2012-07-19 DIAGNOSIS — F3289 Other specified depressive episodes: Secondary | ICD-10-CM

## 2012-07-19 DIAGNOSIS — Z1239 Encounter for other screening for malignant neoplasm of breast: Secondary | ICD-10-CM

## 2012-07-19 DIAGNOSIS — G894 Chronic pain syndrome: Secondary | ICD-10-CM

## 2012-07-19 DIAGNOSIS — F39 Unspecified mood [affective] disorder: Secondary | ICD-10-CM

## 2012-07-19 DIAGNOSIS — Z1231 Encounter for screening mammogram for malignant neoplasm of breast: Secondary | ICD-10-CM

## 2012-07-19 MED ORDER — HYDROCODONE-ACETAMINOPHEN 10-325 MG PO TABS: ORAL_TABLET | ORAL | Status: DC

## 2012-07-19 MED ORDER — TRAZODONE HCL 100 MG PO TABS: 100.0000 mg | ORAL_TABLET | Freq: Every day | ORAL | Status: DC

## 2012-07-19 MED ORDER — FLUOXETINE HCL 20 MG PO TABS: 60.0000 mg | ORAL_TABLET | Freq: Every day | ORAL | Status: DC

## 2012-07-19 MED ORDER — ALPRAZOLAM 1 MG PO TABS: 1.0000 mg | ORAL_TABLET | Freq: Three times a day (TID) | ORAL | Status: DC | PRN
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
## 2012-07-19 NOTE — Patient Instructions (Signed)
Keep standing up for this magnificent creation of [employer] that you are.  Call if problems or concerns.

## 2012-07-19 NOTE — Addendum Note (Signed)
Addended by: Mike Craze on: 07/19/2012 11:51 AM   Modules accepted: Orders

## 2012-07-19 NOTE — Patient Instructions (Signed)
Get the labs done fasting Continue current medications  Mammogram to be done in April 2014 F/U 3 months

## 2012-07-19 NOTE — Progress Notes (Signed)
  Subjective:    Patient ID: Kathleen Palmer, female    DOB: Aug 31, 1960, 52 y.o.   MRN: [id number]  HPI  Patient here to followup chronic medical problems. She's been followed by psychiatry and is doing well. She does tell me there was a 6 weeks didn't after she was seen at [facility] and they told her she may need another surgery or she became very depressed and had a nervous breakdown however did not seek care during this time. Her Prozac was recently increased to 60 mg and she states that she feels very good on this.. She does not want to return to [facility] for any further testing or workup. She does tell me today that she smokes marijuana every now and then has been doing so for many years. She has not had blood work done Review of Systems  GEN- denies fatigue, fever, weight loss,weakness, recent illness HEENT- denies eye drainage, change in vision, nasal discharge, CVS- denies chest pain, palpitations RESP- denies SOB, cough, wheeze ABD- denies N/V, change in stools, abd pain GU- denies dysuria, hematuria, dribbling, incontinence MSK- +joint pain, muscle aches, injury Neuro- denies headache, dizziness, syncope, seizure activity      Objective:   Physical Exam GEN- NAD, alert and oriented x3 HEENT- PERRL, EOMI, non injected sclera, pink conjunctiva, MMM, oropharynx clear Neck- Supple, CVS- RRR, no murmur RESP-CTAB ABD-- urethral stump/ilieoconduit osteomy- pink and moist, NABS,soft,NT, ventral hernia GU- clitoris irritated appearing, multiple scars in genital region EXT- No edema Pulses- Radial, DP- 2+        Assessment & Plan:

## 2012-07-20 DIAGNOSIS — F1290 Cannabis use, unspecified, uncomplicated: Secondary | ICD-10-CM | POA: Insufficient documentation

## 2012-07-20 NOTE — Assessment & Plan Note (Signed)
Expect marijuana in UDS, discussed this is an illegal substance and can not be used with there pain medication prescription

## 2012-07-20 NOTE — Assessment & Plan Note (Signed)
Labs over due, she will obtain these, no treatments in the past

## 2012-07-20 NOTE — Assessment & Plan Note (Signed)
Mood much improved, she is doing well with her psychiatrist

## 2012-07-20 NOTE — Addendum Note (Signed)
Addended by: Milinda Antis F on: 07/20/2012 12:01 PM   Modules accepted: Orders

## 2012-07-20 NOTE — Assessment & Plan Note (Signed)
For now no further interventions

## 2012-07-30 NOTE — Addendum Note (Signed)
Addended by: Milinda Antis F on: 07/30/2012 08:20 AM   Modules accepted: Orders

## 2012-08-04 ENCOUNTER — Ambulatory Visit (HOSPITAL_COMMUNITY): Payer: Self-pay | Admitting: Psychiatry

## 2012-08-09 ENCOUNTER — Ambulatory Visit (HOSPITAL_COMMUNITY): Payer: Self-pay | Admitting: Psychiatry

## 2012-08-12 ENCOUNTER — Ambulatory Visit (INDEPENDENT_AMBULATORY_CARE_PROVIDER_SITE_OTHER): Payer: Medicare Other | Admitting: Psychiatry

## 2012-08-12 ENCOUNTER — Ambulatory Visit (HOSPITAL_COMMUNITY): Payer: Self-pay

## 2012-08-12 DIAGNOSIS — F4310 Post-traumatic stress disorder, unspecified: Secondary | ICD-10-CM

## 2012-08-12 DIAGNOSIS — F329 Major depressive disorder, single episode, unspecified: Secondary | ICD-10-CM

## 2012-08-17 NOTE — Patient Instructions (Addendum)
Discussed orally 
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
## 2012-08-24 ENCOUNTER — Encounter: Payer: Self-pay | Admitting: *Deleted

## 2012-09-03 ENCOUNTER — Ambulatory Visit (INDEPENDENT_AMBULATORY_CARE_PROVIDER_SITE_OTHER): Payer: Medicare Other | Admitting: Psychiatry

## 2012-09-03 DIAGNOSIS — F329 Major depressive disorder, single episode, unspecified: Secondary | ICD-10-CM | POA: Diagnosis not present

## 2012-09-03 DIAGNOSIS — F4310 Post-traumatic stress disorder, unspecified: Secondary | ICD-10-CM | POA: Diagnosis not present
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
## 2012-09-03 NOTE — Patient Instructions (Signed)
Discussed orally 

## 2012-09-10 ENCOUNTER — Ambulatory Visit (HOSPITAL_COMMUNITY): Payer: Self-pay | Admitting: Psychiatry

## 2012-09-21 ENCOUNTER — Ambulatory Visit (HOSPITAL_COMMUNITY): Payer: Self-pay | Admitting: Psychiatry

## 2012-09-22 ENCOUNTER — Other Ambulatory Visit: Payer: Self-pay | Admitting: Family Medicine

## 2012-09-23 ENCOUNTER — Other Ambulatory Visit (HOSPITAL_COMMUNITY): Payer: Self-pay | Admitting: Psychiatry

## 2012-09-23 ENCOUNTER — Other Ambulatory Visit: Payer: Self-pay | Admitting: Family Medicine

## 2012-10-01 ENCOUNTER — Ambulatory Visit (HOSPITAL_COMMUNITY): Payer: Medicare Other | Admitting: Psychiatry

## 2012-10-19 ENCOUNTER — Ambulatory Visit: Payer: Self-pay | Admitting: Family Medicine

## 2012-10-19 ENCOUNTER — Ambulatory Visit (HOSPITAL_COMMUNITY): Payer: Self-pay | Admitting: Psychiatry

## 2012-10-20 ENCOUNTER — Encounter (HOSPITAL_COMMUNITY): Payer: Self-pay | Admitting: Psychiatry

## 2012-10-20 ENCOUNTER — Ambulatory Visit (INDEPENDENT_AMBULATORY_CARE_PROVIDER_SITE_OTHER): Payer: Medicare Other | Admitting: Psychiatry

## 2012-10-20 VITALS — BP 122/84 | HR 75 | Ht 62.75 in | Wt 190.2 lb

## 2012-10-20 DIAGNOSIS — F5105 Insomnia due to other mental disorder: Secondary | ICD-10-CM

## 2012-10-20 DIAGNOSIS — F1210 Cannabis abuse, uncomplicated: Secondary | ICD-10-CM

## 2012-10-20 DIAGNOSIS — F1290 Cannabis use, unspecified, uncomplicated: Secondary | ICD-10-CM

## 2012-10-20 DIAGNOSIS — F39 Unspecified mood [affective] disorder: Secondary | ICD-10-CM

## 2012-10-20 DIAGNOSIS — F4310 Post-traumatic stress disorder, unspecified: Secondary | ICD-10-CM

## 2012-10-20 DIAGNOSIS — F329 Major depressive disorder, single episode, unspecified: Secondary | ICD-10-CM

## 2012-10-20 DIAGNOSIS — G894 Chronic pain syndrome: Secondary | ICD-10-CM

## 2012-10-20 MED ORDER — FLUOXETINE HCL 20 MG PO TABS: 60.0000 mg | ORAL_TABLET | Freq: Every day | ORAL | Status: DC

## 2012-10-20 MED ORDER — ALPRAZOLAM 1 MG PO TABS: 1.0000 mg | ORAL_TABLET | Freq: Three times a day (TID) | ORAL | Status: DC | PRN

## 2012-10-20 MED ORDER — FLUOXETINE HCL 20 MG PO TABS: 80.0000 mg | ORAL_TABLET | Freq: Every day | ORAL | Status: DC

## 2012-10-20 MED ORDER — TRAZODONE HCL 100 MG PO TABS: 100.0000 mg | ORAL_TABLET | Freq: Every day | ORAL | Status: DC

## 2012-10-20 NOTE — Addendum Note (Signed)
Addended by: Mike Craze on: 10/20/2012 02:55 PM   Modules accepted: Orders

## 2012-10-20 NOTE — Patient Instructions (Signed)
Get into some kind of creative pursuit.  That is what helps healing.  Get back into talking therapy.  Call if problems or concerns.
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
## 2012-11-15 ENCOUNTER — Ambulatory Visit (INDEPENDENT_AMBULATORY_CARE_PROVIDER_SITE_OTHER): Payer: Medicare Other | Admitting: Family Medicine

## 2012-11-15 VITALS — BP 110/80 | HR 78 | Temp 98.2°F | Resp 18 | Wt 191.0 lb

## 2012-11-15 DIAGNOSIS — G894 Chronic pain syndrome: Secondary | ICD-10-CM

## 2012-11-15 DIAGNOSIS — N821 Other female urinary-genital tract fistulae: Secondary | ICD-10-CM

## 2012-11-15 DIAGNOSIS — N820 Vesicovaginal fistula: Secondary | ICD-10-CM

## 2012-11-15 DIAGNOSIS — F1290 Cannabis use, unspecified, uncomplicated: Secondary | ICD-10-CM

## 2012-11-15 DIAGNOSIS — F1210 Cannabis abuse, uncomplicated: Secondary | ICD-10-CM

## 2012-11-15 NOTE — Patient Instructions (Signed)
Get the labs done fasting in [place] Pain medication will be increased to 150 tablets  F/U 4 months

## 2012-11-16 ENCOUNTER — Encounter: Payer: Self-pay | Admitting: Family Medicine

## 2012-11-16 NOTE — Progress Notes (Signed)
  Subjective:    Patient ID: Kathleen Palmer, female    DOB: January 08, 1961, 52 y.o.   MRN: [id number]  HPI  Patient here follow chronic medical problems. She's not her labs done because she had smoked Marijuana a few weeks ago. She denies any other illegal medications. She's been trying to cut back on her use. She continues to have pain even from her urostomy bag getting stuck to her genital region. She brings in today a medical bed using where she had an articles starting in her childhood with her multiple medical problems. She still following with psychiatry. She often has to take 4-5 pain medication today secondary to her chronic back and leg and pelvic pain.  Review of Systems   GEN- denies fatigue, fever, weight loss,weakness, recent illness HEENT- denies eye drainage, change in vision, nasal discharge, CVS- denies chest pain, palpitations RESP- denies SOB, cough, wheeze ABD- denies N/V, change in stools, abd pain GU- denies dysuria, hematuria, dribbling, incontinence MSK- denies joint pain, muscle aches, injury Neuro- denies headache, dizziness, syncope, seizure activity      Objective:   Physical Exam  GEN- NAD, alert and oriented x3 HEENT- PERRL, EOMI, non injected sclera, pink conjunctiva, MMM, oropharynx clear Neck- Supple, CVS- RRR, no murmur RESP-CTAB ABD-- urethral stump/ilieoconduit osteomy- pink and moist, NABS,soft,NT, ventral hernia EXT- No edema Pulses- Radial 2+      Assessment & Plan:

## 2012-11-16 NOTE — Assessment & Plan Note (Signed)
Chronic and unchanged she continues to have output. At this time we're not following with a specialist

## 2012-11-16 NOTE — Assessment & Plan Note (Signed)
Chronic pain is unchanged. We did discuss getting different urostomy bag so that it will not attached to the skin. At this time will continue her pain medications. She will be getting 150 tablets a month. She's an allergy to oxycodone therefore we will stick with the hydrocodone. She has significant pain in the recent past so. With her multiple surgeries in the past as well as her unstable pelvic region  Reiterated need for her labs. I am aware that she has smoked marijuana expect this to be positive in the serum drug testing. I've advised her she does not get her labs done within the next month I will not still any further pain medications.

## 2012-11-16 NOTE — Assessment & Plan Note (Signed)
Continue to encourage her to decrease the use secondary to her pain contract. This has been difficult for her.

## 2012-11-22 ENCOUNTER — Telehealth (HOSPITAL_COMMUNITY): Payer: Self-pay

## 2012-11-22 ENCOUNTER — Telehealth: Payer: Self-pay | Admitting: Family Medicine

## 2012-11-22 ENCOUNTER — Other Ambulatory Visit: Payer: Self-pay | Admitting: Family Medicine

## 2012-11-22 NOTE — Telephone Encounter (Signed)
Ok to refill 

## 2012-11-22 NOTE — Telephone Encounter (Signed)
Med refilled and pt coming to pick up

## 2012-11-22 NOTE — Telephone Encounter (Signed)
Okay to refill, I have changed her to 150 tablets, change, directions to say 1 tab every 6 hours as needed

## 2012-11-23 ENCOUNTER — Telehealth (HOSPITAL_COMMUNITY): Payer: Self-pay | Admitting: Psychiatry

## 2012-11-23 ENCOUNTER — Other Ambulatory Visit (HOSPITAL_COMMUNITY): Payer: Self-pay | Admitting: *Deleted

## 2012-11-23 DIAGNOSIS — F4310 Post-traumatic stress disorder, unspecified: Secondary | ICD-10-CM

## 2012-11-23 MED ORDER — ALPRAZOLAM 1 MG PO TABS: 1.0000 mg | ORAL_TABLET | Freq: Three times a day (TID) | ORAL | Status: DC | PRN

## 2012-11-23 NOTE — Telephone Encounter (Signed)
Refill request has [facility] called [facility] per Dirk Dress order given for refill of 45 tabs with no refills.

## 2012-11-23 NOTE — Telephone Encounter (Signed)
Pt had called requesting refill of Xanax 1mg . Spoke with Jorje Guild who is covering for Dr. Lolly Mustache today. He gave verbal order to refill Xanax 1mg  TID PRN with 45 tabs and no refills. Dr Dan Humphreys had given patient prescription with refills however [facility] was unable to fill due to not having a DEA number. Since Dr Dan Humphreys no longer works at this clinic Jorje Guild agreed to refill for 45 tabs. [facility] [place]. Called patient to notify her that refill has been called in. Pt very appreciative and states she will see [place] soon for her appointment in August.

## 2012-11-24 ENCOUNTER — Telehealth (HOSPITAL_COMMUNITY): Payer: Self-pay | Admitting: Psychiatry

## 2012-11-24 NOTE — Telephone Encounter (Signed)
I called pharmacy.  Spoke with the pharmacist.  He told Dr. Tilman Neat DEA number is not working.  A new prescription of Xanax has been called in yesterday by Kathleen Palmer.  Patient supposed to pick up her prescription today.

## 2012-11-25 NOTE — Telephone Encounter (Signed)
Was called in 11/22/12

## 2012-12-06 ENCOUNTER — Ambulatory Visit (INDEPENDENT_AMBULATORY_CARE_PROVIDER_SITE_OTHER): Payer: Medicare Other | Admitting: Psychiatry

## 2012-12-06 ENCOUNTER — Encounter (HOSPITAL_COMMUNITY): Payer: Self-pay | Admitting: Psychiatry

## 2012-12-06 VITALS — BP 160/120 | Ht 63.0 in | Wt 193.0 lb

## 2012-12-06 DIAGNOSIS — F4310 Post-traumatic stress disorder, unspecified: Secondary | ICD-10-CM

## 2012-12-06 DIAGNOSIS — F5105 Insomnia due to other mental disorder: Secondary | ICD-10-CM

## 2012-12-06 DIAGNOSIS — F329 Major depressive disorder, single episode, unspecified: Secondary | ICD-10-CM

## 2012-12-06 DIAGNOSIS — F1210 Cannabis abuse, uncomplicated: Secondary | ICD-10-CM

## 2012-12-06 DIAGNOSIS — F39 Unspecified mood [affective] disorder: Secondary | ICD-10-CM

## 2012-12-06 MED ORDER — FLUOXETINE HCL 20 MG PO TABS: 80.0000 mg | ORAL_TABLET | Freq: Every day | ORAL | Status: DC

## 2012-12-06 MED ORDER — TRAZODONE HCL 100 MG PO TABS: 100.0000 mg | ORAL_TABLET | Freq: Every day | ORAL | Status: DC

## 2012-12-06 MED ORDER — ALPRAZOLAM 1 MG PO TABS: 1.0000 mg | ORAL_TABLET | Freq: Three times a day (TID) | ORAL | Status: DC | PRN
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
## 2012-12-09 ENCOUNTER — Ambulatory Visit (HOSPITAL_COMMUNITY): Payer: Self-pay | Admitting: Psychiatry

## 2012-12-16 ENCOUNTER — Ambulatory Visit (HOSPITAL_COMMUNITY): Payer: Self-pay | Admitting: Psychiatry

## 2012-12-28 ENCOUNTER — Telehealth: Payer: Self-pay | Admitting: Family Medicine

## 2012-12-28 ENCOUNTER — Ambulatory Visit (INDEPENDENT_AMBULATORY_CARE_PROVIDER_SITE_OTHER): Payer: Medicare Other | Admitting: Psychiatry

## 2012-12-28 DIAGNOSIS — E039 Hypothyroidism, unspecified: Secondary | ICD-10-CM | POA: Diagnosis not present

## 2012-12-28 DIAGNOSIS — F329 Major depressive disorder, single episode, unspecified: Secondary | ICD-10-CM

## 2012-12-28 DIAGNOSIS — R030 Elevated blood-pressure reading, without diagnosis of hypertension: Secondary | ICD-10-CM | POA: Diagnosis not present

## 2012-12-28 DIAGNOSIS — B1920 Unspecified viral hepatitis C without hepatic coma: Secondary | ICD-10-CM | POA: Diagnosis not present

## 2012-12-28 DIAGNOSIS — Z79899 Other long term (current) drug therapy: Secondary | ICD-10-CM | POA: Diagnosis not present

## 2012-12-28 LAB — LIPID PANEL
Cholesterol: 195 mg/dL (ref 0–200)
Total CHOL/HDL Ratio: 3.3 Ratio

## 2012-12-28 LAB — HEPATIC FUNCTION PANEL
ALT: 35 U/L (ref 0–35)
AST: 26 U/L (ref 0–37)
Albumin: 4.1 g/dL (ref 3.5–5.2)
Alkaline Phosphatase: 63 U/L (ref 39–117)
Indirect Bilirubin: 0.3 mg/dL (ref 0.0–0.9)
Total Protein: 7 g/dL (ref 6.0–8.3)

## 2012-12-28 LAB — BASIC METABOLIC PANEL
BUN: 15 mg/dL (ref 6–23)
Calcium: 9.4 mg/dL (ref 8.4–10.5)
Creat: 0.92 mg/dL (ref 0.50–1.10)

## 2012-12-28 LAB — CBC
HCT: 41.2 % (ref 36.0–46.0)
Hemoglobin: 14.5 g/dL (ref 12.0–15.0)
MCH: 29.8 pg (ref 26.0–34.0)
MCV: 84.8 fL (ref 78.0–100.0)
Platelets: 198 10*3/uL (ref 150–400)
RBC: 4.86 MIL/uL (ref 3.87–5.11)

## 2012-12-28 LAB — TSH: TSH: 3.877 u[IU]/mL (ref 0.350–4.500)

## 2012-12-28 LAB — T4: T4, Total: 10.9 ug/dL (ref 5.0–12.5)

## 2012-12-28 LAB — T3, FREE: T3, Free: 2.9 pg/mL (ref 2.3–4.2)

## 2012-12-28 NOTE — Patient Instructions (Signed)
Discussed orally 

## 2012-12-28 NOTE — Telephone Encounter (Signed)
Norco (pt said that you were going to up her to 5 a day instead of 4 a day and she also said that she went and had her labwork done this morning)

## 2012-12-28 NOTE — Telephone Encounter (Signed)
Okay to refill her script is for 150 tablets, this is the increase I discussed with her

## 2012-12-28 NOTE — Telephone Encounter (Signed)
Ok to refill 
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
## 2012-12-29 MED ORDER — HYDROCODONE-ACETAMINOPHEN 10-325 MG PO TABS: 1.0000 | ORAL_TABLET | Freq: Four times a day (QID) | ORAL | Status: DC | PRN

## 2012-12-29 NOTE — Telephone Encounter (Signed)
Med phoned in °

## 2013-01-11 ENCOUNTER — Ambulatory Visit (HOSPITAL_COMMUNITY): Payer: Self-pay | Admitting: Psychiatry

## 2013-01-21 ENCOUNTER — Ambulatory Visit (HOSPITAL_COMMUNITY): Payer: Self-pay | Admitting: Psychiatry

## 2013-01-27 ENCOUNTER — Telehealth: Payer: Self-pay | Admitting: Family Medicine

## 2013-01-27 NOTE — Telephone Encounter (Signed)
Norco 10-325 1 q6 hours prn pain

## 2013-01-28 MED ORDER — HYDROCODONE-ACETAMINOPHEN 10-325 MG PO TABS: 1.0000 | ORAL_TABLET | Freq: Four times a day (QID) | ORAL | Status: DC | PRN

## 2013-01-28 NOTE — Telephone Encounter (Signed)
Meds refilled, pt aware.

## 2013-01-28 NOTE — Telephone Encounter (Signed)
Ok to refill 

## 2013-01-28 NOTE — Telephone Encounter (Signed)
yes

## 2013-02-04 ENCOUNTER — Ambulatory Visit (INDEPENDENT_AMBULATORY_CARE_PROVIDER_SITE_OTHER): Payer: Medicare Other | Admitting: Psychiatry

## 2013-02-04 ENCOUNTER — Encounter (HOSPITAL_COMMUNITY): Payer: Self-pay | Admitting: Psychiatry

## 2013-02-04 VITALS — BP 150/90 | Ht 63.0 in | Wt 187.0 lb

## 2013-02-04 DIAGNOSIS — F39 Unspecified mood [affective] disorder: Secondary | ICD-10-CM

## 2013-02-04 DIAGNOSIS — F5105 Insomnia due to other mental disorder: Secondary | ICD-10-CM

## 2013-02-04 DIAGNOSIS — F329 Major depressive disorder, single episode, unspecified: Secondary | ICD-10-CM

## 2013-02-04 DIAGNOSIS — F1210 Cannabis abuse, uncomplicated: Secondary | ICD-10-CM

## 2013-02-04 DIAGNOSIS — F4310 Post-traumatic stress disorder, unspecified: Secondary | ICD-10-CM

## 2013-02-04 MED ORDER — TRAZODONE HCL 100 MG PO TABS: 100.0000 mg | ORAL_TABLET | Freq: Every day | ORAL | Status: DC

## 2013-02-04 MED ORDER — FLUOXETINE HCL 20 MG PO TABS: 80.0000 mg | ORAL_TABLET | Freq: Every day | ORAL | Status: DC

## 2013-02-04 MED ORDER — ALPRAZOLAM 1 MG PO TABS: 1.0000 mg | ORAL_TABLET | Freq: Three times a day (TID) | ORAL | Status: DC | PRN
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
## 2013-02-22 ENCOUNTER — Telehealth (HOSPITAL_COMMUNITY): Payer: Self-pay | Admitting: *Deleted

## 2013-02-22 NOTE — Telephone Encounter (Signed)
Prozac 40 mg, 2 a day

## 2013-02-28 ENCOUNTER — Telehealth: Payer: Self-pay | Admitting: Family Medicine

## 2013-02-28 MED ORDER — HYDROCODONE-ACETAMINOPHEN 10-325 MG PO TABS: 1.0000 | ORAL_TABLET | Freq: Four times a day (QID) | ORAL | Status: DC | PRN

## 2013-02-28 NOTE — Telephone Encounter (Signed)
Needs NORCO refilled

## 2013-02-28 NOTE — Telephone Encounter (Signed)
Okay to refill? 

## 2013-02-28 NOTE — Telephone Encounter (Signed)
Script printed ready for doctor signature

## 2013-02-28 NOTE — Telephone Encounter (Signed)
Ok to refill 

## 2013-03-01 ENCOUNTER — Telehealth: Payer: Self-pay | Admitting: *Deleted

## 2013-03-02 MED ORDER — HYDROCODONE-ACETAMINOPHEN 10-325 MG PO TABS: 1.0000 | ORAL_TABLET | ORAL | Status: DC | PRN

## 2013-03-02 NOTE — Telephone Encounter (Signed)
Noted, new script signed

## 2013-03-02 NOTE — Telephone Encounter (Signed)
The prescription is already for 150 tablets which is 5 a day, I dont see what the issue is

## 2013-03-02 NOTE — Telephone Encounter (Signed)
The sig is wrong for her to get 150 per month. It needs to read q5 hours. The pharmacy will not fill unless sig is correct with qty. New rx printed and pt aware she can pick up in the AM. She is also aware that she must bring in other rx to get new.

## 2013-03-03 NOTE — Telephone Encounter (Signed)
Pt did return the previous prescription this AM

## 2013-03-04 ENCOUNTER — Encounter (HOSPITAL_COMMUNITY): Payer: Self-pay | Admitting: Psychiatry

## 2013-03-04 ENCOUNTER — Ambulatory Visit (INDEPENDENT_AMBULATORY_CARE_PROVIDER_SITE_OTHER): Payer: Medicare Other | Admitting: Psychiatry

## 2013-03-04 VITALS — BP 130/88 | Ht 63.0 in | Wt 194.0 lb

## 2013-03-04 DIAGNOSIS — F329 Major depressive disorder, single episode, unspecified: Secondary | ICD-10-CM

## 2013-03-04 DIAGNOSIS — F39 Unspecified mood [affective] disorder: Secondary | ICD-10-CM

## 2013-03-04 DIAGNOSIS — F5105 Insomnia due to other mental disorder: Secondary | ICD-10-CM

## 2013-03-04 DIAGNOSIS — F1210 Cannabis abuse, uncomplicated: Secondary | ICD-10-CM

## 2013-03-04 MED ORDER — TRAZODONE HCL 100 MG PO TABS: 100.0000 mg | ORAL_TABLET | Freq: Every day | ORAL | Status: DC
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
## 2013-03-18 ENCOUNTER — Ambulatory Visit: Payer: Medicare Other | Admitting: Family Medicine

## 2013-03-22 ENCOUNTER — Ambulatory Visit: Payer: Medicare Other | Admitting: Family Medicine

## 2013-03-22 ENCOUNTER — Telehealth: Payer: Self-pay | Admitting: Family Medicine

## 2013-03-22 NOTE — Telephone Encounter (Signed)
Just FYI: pt wanted you know that she cancelled her appt d/t depression/mental breakdown she is on Prozac and not working she is Scientist, clinical (histocompatibility and immunogenetics), she stated that her sister tried to get her to go to ED but refused to go and wants to wait until her appt with psych tomorrow states if she can not get in tomorrow and still having breakdown she will go to ED

## 2013-03-22 NOTE — Telephone Encounter (Signed)
Kathleen Palmer called and cancelled her apt for today because she is having a mental breakdown and she would like for someone to give her a call Call back is [phone]

## 2013-03-22 NOTE — Telephone Encounter (Signed)
Noted  

## 2013-03-23 ENCOUNTER — Encounter (HOSPITAL_COMMUNITY): Payer: Self-pay | Admitting: Psychiatry

## 2013-03-23 ENCOUNTER — Ambulatory Visit (INDEPENDENT_AMBULATORY_CARE_PROVIDER_SITE_OTHER): Payer: Medicare Other | Admitting: Psychiatry

## 2013-03-23 VITALS — BP 130/88 | Ht 63.0 in | Wt 187.0 lb

## 2013-03-23 DIAGNOSIS — F329 Major depressive disorder, single episode, unspecified: Secondary | ICD-10-CM

## 2013-03-23 DIAGNOSIS — F4310 Post-traumatic stress disorder, unspecified: Secondary | ICD-10-CM

## 2013-03-23 DIAGNOSIS — F1210 Cannabis abuse, uncomplicated: Secondary | ICD-10-CM

## 2013-03-23 DIAGNOSIS — F3289 Other specified depressive episodes: Secondary | ICD-10-CM

## 2013-03-23 DIAGNOSIS — F39 Unspecified mood [affective] disorder: Secondary | ICD-10-CM

## 2013-03-23 MED ORDER — ALPRAZOLAM 1 MG PO TABS: 1.0000 mg | ORAL_TABLET | Freq: Three times a day (TID) | ORAL | Status: DC | PRN

## 2013-03-23 NOTE — Patient Instructions (Signed)
Cut prozac down by 10 mg per week Start Brintellix 5 mg at dinner for one week , then go to 10 mg
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
## 2013-03-25 ENCOUNTER — Ambulatory Visit (INDEPENDENT_AMBULATORY_CARE_PROVIDER_SITE_OTHER): Payer: Medicare Other | Admitting: Family Medicine

## 2013-03-25 ENCOUNTER — Encounter: Payer: Self-pay | Admitting: Family Medicine

## 2013-03-25 VITALS — BP 110/78 | HR 68 | Temp 98.4°F | Resp 18 | Ht 63.0 in | Wt 190.0 lb

## 2013-03-25 DIAGNOSIS — M25569 Pain in unspecified knee: Secondary | ICD-10-CM | POA: Insufficient documentation

## 2013-03-25 DIAGNOSIS — R0789 Other chest pain: Secondary | ICD-10-CM | POA: Insufficient documentation

## 2013-03-25 DIAGNOSIS — G894 Chronic pain syndrome: Secondary | ICD-10-CM

## 2013-03-25 DIAGNOSIS — F3289 Other specified depressive episodes: Secondary | ICD-10-CM

## 2013-03-25 DIAGNOSIS — F329 Major depressive disorder, single episode, unspecified: Secondary | ICD-10-CM

## 2013-03-25 DIAGNOSIS — Z1211 Encounter for screening for malignant neoplasm of colon: Secondary | ICD-10-CM | POA: Diagnosis not present

## 2013-03-25 DIAGNOSIS — Z1231 Encounter for screening mammogram for malignant neoplasm of breast: Secondary | ICD-10-CM

## 2013-03-25 DIAGNOSIS — F32A Depression, unspecified: Secondary | ICD-10-CM

## 2013-03-25 DIAGNOSIS — M25561 Pain in right knee: Secondary | ICD-10-CM

## 2013-03-25 MED ORDER — MELOXICAM 7.5 MG PO TABS: 7.5000 mg | ORAL_TABLET | Freq: Every day | ORAL | Status: DC

## 2013-03-25 MED ORDER — HYDROCODONE-ACETAMINOPHEN 10-325 MG PO TABS: 1.0000 | ORAL_TABLET | ORAL | Status: DC | PRN

## 2013-03-25 NOTE — Patient Instructions (Signed)
Take the anti-inflammatory once a day with food Call if no improvement with knee after 4 weeks Continue pain medications Mammogram to be scheduled Stool cards to evaluate for colon cancer F/U 4 months

## 2013-03-25 NOTE — Assessment & Plan Note (Signed)
I reviewed her note by psychiatry she's been taking medications as prescribed

## 2013-03-25 NOTE — Assessment & Plan Note (Signed)
Think her chest pain was due to anxiety as she was in the middle of what she called a nervous breakdown. Her EKG is completely normal. She does not have any true risk factors for coronary artery disease

## 2013-03-25 NOTE — Assessment & Plan Note (Signed)
She declines colonoscopy at this time secondary to her multiple GYN surgeries. We will opt for  stool card

## 2013-03-25 NOTE — Assessment & Plan Note (Signed)
I see no evidence of Baker's cyst today. I'll have her using inflammatory for the next 4 weeks as well as icing. If it does not improve will obtain ultrasound as well as x-ray of the knee

## 2013-03-25 NOTE — Progress Notes (Signed)
   Subjective:    Patient ID: Kathleen Palmer, female    DOB: [birth year], 52 y.o.   MRN: [id number]  HPI  Patient here with multiple concerns. She states that she was having a mental breakdown over the past couple months. She recently saw her psychiatrist 2 to change her medication from Prozac to Brintillex. She states that she feels much better and she is sought care. She does describe having 2 days of constant chest pain substernal which was nonradiating no associated with diaphoresis or shortness of breath. The pain has now resolved.   She also complains of right knee pain states in the past she had a Baker cyst that had to be removed. She's had pain for the past few weeks. No falls or specific injuries. She has noticed some mild swelling on and off.  She would like to have her mammogram rescheduled    Review of Systems  GEN- denies fatigue, fever, weight loss,weakness, recent illness HEENT- denies eye drainage, change in vision, nasal discharge, CVS-+ chest pain, palpitations RESP- denies SOB, cough, wheeze ABD- denies N/V, change in stools, abd pain GU- denies dysuria, hematuria, dribbling, incontinence MSK- +joint pain, muscle aches, injury Neuro- denies headache, dizziness, syncope, seizure activity      Objective:   Physical Exam  GEN- NAD, alert and oriented x3 HEENT- PERRL, EOMI, non injected sclera, pink conjunctiva, MMM, oropharynx clear Neck- Supple, CVS- RRR, no murmur RESP-CTAB MSK- Bilateral knees normal inspection, Right knee- no effusion, good ROM, no popliteal fullness or pain, mild TTP lateral aspect of knee, ligaments in tact EXT- No edema Pulses- Radial 2+ EKG- NSR, no ST changes Psych- depressed affect, crying at times, no SI, no halluciantions, good eye contact       Assessment & Plan:

## 2013-03-25 NOTE — Assessment & Plan Note (Signed)
Pain medications refilled. Continue to reiterate her need to decrease her marijuana use

## 2013-03-28 ENCOUNTER — Telehealth (HOSPITAL_COMMUNITY): Payer: Self-pay

## 2013-03-29 NOTE — Telephone Encounter (Signed)
Pt still depressed on brintellix 5 mg, told to go up to 10 mg. Denies SI

## 2013-04-01 ENCOUNTER — Ambulatory Visit (HOSPITAL_COMMUNITY): Payer: Self-pay | Admitting: Psychiatry

## 2013-04-06 ENCOUNTER — Ambulatory Visit (INDEPENDENT_AMBULATORY_CARE_PROVIDER_SITE_OTHER): Payer: Medicare Other | Admitting: Psychiatry

## 2013-04-06 ENCOUNTER — Encounter (HOSPITAL_COMMUNITY): Payer: Self-pay | Admitting: Psychiatry

## 2013-04-06 VITALS — BP 130/90 | Ht 63.0 in | Wt 190.0 lb

## 2013-04-06 DIAGNOSIS — F1210 Cannabis abuse, uncomplicated: Secondary | ICD-10-CM

## 2013-04-06 DIAGNOSIS — F39 Unspecified mood [affective] disorder: Secondary | ICD-10-CM

## 2013-04-06 DIAGNOSIS — F329 Major depressive disorder, single episode, unspecified: Secondary | ICD-10-CM

## 2013-04-06 DIAGNOSIS — Z8659 Personal history of other mental and behavioral disorders: Secondary | ICD-10-CM
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
## 2013-04-07 ENCOUNTER — Ambulatory Visit (HOSPITAL_COMMUNITY): Payer: Self-pay

## 2013-04-15 ENCOUNTER — Encounter: Payer: Self-pay | Admitting: Family Medicine

## 2013-04-18 ENCOUNTER — Encounter (HOSPITAL_COMMUNITY): Payer: Self-pay | Admitting: Psychiatry

## 2013-04-18 ENCOUNTER — Telehealth (HOSPITAL_COMMUNITY): Payer: Self-pay | Admitting: Psychiatry

## 2013-04-18 ENCOUNTER — Ambulatory Visit (INDEPENDENT_AMBULATORY_CARE_PROVIDER_SITE_OTHER): Payer: Medicare Other | Admitting: Psychiatry

## 2013-04-18 VITALS — BP 140/90 | Ht 63.0 in | Wt 194.0 lb

## 2013-04-18 DIAGNOSIS — F329 Major depressive disorder, single episode, unspecified: Secondary | ICD-10-CM

## 2013-04-18 DIAGNOSIS — F1210 Cannabis abuse, uncomplicated: Secondary | ICD-10-CM

## 2013-04-18 DIAGNOSIS — F39 Unspecified mood [affective] disorder: Secondary | ICD-10-CM

## 2013-04-18 MED ORDER — SERTRALINE HCL 100 MG PO TABS: 100.0000 mg | ORAL_TABLET | Freq: Every day | ORAL | Status: DC

## 2013-04-18 NOTE — Telephone Encounter (Signed)
discussed
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
## 2013-04-29 ENCOUNTER — Telehealth: Payer: Self-pay | Admitting: *Deleted

## 2013-04-29 MED ORDER — HYDROCODONE-ACETAMINOPHEN 10-325 MG PO TABS: 1.0000 | ORAL_TABLET | ORAL | Status: DC | PRN

## 2013-04-29 NOTE — Telephone Encounter (Signed)
Rx printed for provider signature.  Pt called left mess Rx ready for pick up

## 2013-04-29 NOTE — Telephone Encounter (Signed)
Last RF 11/28  #150  Last OV 11/28  OK refill?

## 2013-04-29 NOTE — Telephone Encounter (Signed)
ok 

## 2013-05-03 ENCOUNTER — Ambulatory Visit (HOSPITAL_COMMUNITY): Payer: Self-pay

## 2013-05-05 ENCOUNTER — Ambulatory Visit (HOSPITAL_COMMUNITY): Payer: Self-pay | Admitting: Psychiatry

## 2013-05-05 ENCOUNTER — Encounter: Payer: Self-pay | Admitting: *Deleted

## 2013-05-11 ENCOUNTER — Encounter (HOSPITAL_COMMUNITY): Payer: Self-pay | Admitting: Psychiatry

## 2013-05-11 ENCOUNTER — Ambulatory Visit (INDEPENDENT_AMBULATORY_CARE_PROVIDER_SITE_OTHER): Payer: Medicare Other | Admitting: Psychiatry

## 2013-05-11 VITALS — BP 130/88 | Ht 63.0 in | Wt 191.0 lb

## 2013-05-11 DIAGNOSIS — F329 Major depressive disorder, single episode, unspecified: Secondary | ICD-10-CM

## 2013-05-11 DIAGNOSIS — F3289 Other specified depressive episodes: Secondary | ICD-10-CM

## 2013-05-11 DIAGNOSIS — F39 Unspecified mood [affective] disorder: Secondary | ICD-10-CM

## 2013-05-11 DIAGNOSIS — F1210 Cannabis abuse, uncomplicated: Secondary | ICD-10-CM

## 2013-05-11 DIAGNOSIS — F5105 Insomnia due to other mental disorder: Secondary | ICD-10-CM

## 2013-05-11 DIAGNOSIS — F4310 Post-traumatic stress disorder, unspecified: Secondary | ICD-10-CM

## 2013-05-11 MED ORDER — TRAZODONE HCL 100 MG PO TABS
100.0000 mg | ORAL_TABLET | Freq: Every day | ORAL | Status: DC
Start: 2013-05-11 — End: 2013-07-22

## 2013-05-11 MED ORDER — ALPRAZOLAM 1 MG PO TABS: 1.0000 mg | ORAL_TABLET | Freq: Four times a day (QID) | ORAL | Status: DC

## 2013-05-11 MED ORDER — ALPRAZOLAM 1 MG PO TABS: 1.0000 mg | ORAL_TABLET | Freq: Three times a day (TID) | ORAL | Status: DC | PRN
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
## 2013-05-16 ENCOUNTER — Other Ambulatory Visit (HOSPITAL_COMMUNITY): Payer: Self-pay | Admitting: Psychiatry

## 2013-05-16 ENCOUNTER — Telehealth (HOSPITAL_COMMUNITY): Payer: Self-pay | Admitting: Psychiatry

## 2013-05-16 MED ORDER — ARIPIPRAZOLE 5 MG PO TABS: 5.0000 mg | ORAL_TABLET | Freq: Every day | ORAL | Status: DC

## 2013-05-16 NOTE — Telephone Encounter (Signed)
Pt upset, tearful. Not suicidal. Twin takes a mood stabilizer. Sent in abilify 5 mg daily for pt

## 2013-05-25 ENCOUNTER — Telehealth: Payer: Self-pay | Admitting: Family Medicine

## 2013-05-25 MED ORDER — HYDROCODONE-ACETAMINOPHEN 10-325 MG PO TABS: 1.0000 | ORAL_TABLET | ORAL | Status: DC | PRN

## 2013-05-25 NOTE — Telephone Encounter (Signed)
Okay to pick up, but she cant fill until the 2nd

## 2013-05-25 NOTE — Telephone Encounter (Signed)
?   Ok to refill, last refill 04/29/13, pt aware that she is early on requesting but wanting to be sure she recieves before running out.

## 2013-05-25 NOTE — Telephone Encounter (Signed)
Pt is needing her Hydrocodone refilled Call back number is [phone]

## 2013-05-26 NOTE — Telephone Encounter (Signed)
Pt aware script is ready and dr. approved

## 2013-06-01 ENCOUNTER — Ambulatory Visit (HOSPITAL_COMMUNITY): Payer: Self-pay | Admitting: Psychiatry

## 2013-06-08 ENCOUNTER — Ambulatory Visit (INDEPENDENT_AMBULATORY_CARE_PROVIDER_SITE_OTHER): Payer: Medicare Other | Admitting: Psychiatry

## 2013-06-08 ENCOUNTER — Encounter (HOSPITAL_COMMUNITY): Payer: Self-pay | Admitting: Psychiatry

## 2013-06-08 VITALS — BP 100/80 | Ht 63.0 in | Wt 188.0 lb

## 2013-06-08 DIAGNOSIS — F329 Major depressive disorder, single episode, unspecified: Secondary | ICD-10-CM

## 2013-06-08 DIAGNOSIS — F3289 Other specified depressive episodes: Secondary | ICD-10-CM | POA: Diagnosis not present

## 2013-06-08 MED ORDER — SERTRALINE HCL 100 MG PO TABS: 100.0000 mg | ORAL_TABLET | Freq: Two times a day (BID) | ORAL | Status: DC
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
## 2013-06-22 ENCOUNTER — Encounter (HOSPITAL_COMMUNITY): Payer: Self-pay | Admitting: Psychiatry

## 2013-06-22 ENCOUNTER — Ambulatory Visit (HOSPITAL_COMMUNITY): Payer: Self-pay | Admitting: Psychiatry

## 2013-06-24 ENCOUNTER — Telehealth: Payer: Self-pay | Admitting: Family Medicine

## 2013-06-24 MED ORDER — HYDROCODONE-ACETAMINOPHEN 10-325 MG PO TABS: 1.0000 | ORAL_TABLET | ORAL | Status: DC | PRN

## 2013-06-24 NOTE — Telephone Encounter (Signed)
Prescription printed and patient made aware to come pick up.

## 2013-06-24 NOTE — Telephone Encounter (Signed)
Okay to refill? 

## 2013-06-24 NOTE — Telephone Encounter (Signed)
Call back number is [phone] Pt is needing a refill on HYDROcodone-acetaminophen (NORCO) 10-325 MG per tablet

## 2013-06-24 NOTE — Telephone Encounter (Signed)
Ok to refill?  Last office visit 03/25/2013.  Last refill 05/25/2013.

## 2013-06-27 ENCOUNTER — Other Ambulatory Visit: Payer: Self-pay | Admitting: Family Medicine

## 2013-06-27 DIAGNOSIS — Z1231 Encounter for screening mammogram for malignant neoplasm of breast: Secondary | ICD-10-CM

## 2013-06-27 DIAGNOSIS — Z139 Encounter for screening, unspecified: Secondary | ICD-10-CM

## 2013-06-30 ENCOUNTER — Ambulatory Visit (HOSPITAL_COMMUNITY): Payer: Self-pay

## 2013-07-05 ENCOUNTER — Ambulatory Visit (HOSPITAL_COMMUNITY): Payer: Self-pay | Admitting: Psychiatry

## 2013-07-05 NOTE — Progress Notes (Deleted)
   THERAPIST PROGRESS NOTE  Session Time: ***  Participation Level: {BHH PARTICIPATION LEVEL:22264}  Behavioral Response: {Appearance:22683}{BHH LEVEL OF CONSCIOUSNESS:22305}{BHH MOOD:22306}  Type of Therapy: {CHL AMB BH Type of Therapy:21022741}  Treatment Goals addressed: {CHL AMB BH Treatment Goals Addressed:21022754}  Interventions: {CHL AMB BH Type of Intervention:21022753}  Summary: Kathleen Palmer is a 52 y.o. female who presents with ***.   Suicidal/Homicidal: {BHH YES OR NO:22294}{yes/no/with/without intent/plan:22693}  Therapist Response: ***  Plan: Return again in *** weeks.  Diagnosis: Axis I: {psych axis 1:31909}    Axis II: {psych axis 2:31910}    Mariska Daffin, LCSW 07/05/2013  

## 2013-07-05 NOTE — Progress Notes (Deleted)
   THERAPIST PROGRESS NOTE  Session Time: ***  Participation Level: {BHH PARTICIPATION LEVEL:22264}  Behavioral Response: {Appearance:22683}{BHH LEVEL OF CONSCIOUSNESS:22305}{BHH MOOD:22306}  Type of Therapy: {CHL AMB BH Type of Therapy:21022741}  Treatment Goals addressed: {CHL AMB BH Treatment Goals Addressed:21022754}  Interventions: {CHL AMB BH Type of Intervention:21022753}  Summary: Kathleen Palmer is a 53 y.o. female who presents with ***.   Suicidal/Homicidal: {BHH YES OR NO:22294}{yes/no/with/without intent/plan:22693}  Therapist Response: ***  Plan: Return again in *** weeks.  Diagnosis: Axis I: {psych axis 1:31909}    Axis II: {psych axis 2:31910}    Kalei Meda, LCSW 07/05/2013

## 2013-07-05 NOTE — Progress Notes (Deleted)
Psychiatric Assessment Adult  Patient Identification:  Kathleen QUIZHPI Date of Evaluation:  07/05/2013 Chief Complaint: *** History of Chief Complaint:  No chief complaint on file.   HPI Review of Systems Physical Exam  Depressive Symptoms: {DEPRESSION SYMPTOMS:20000}  (Hypo) Manic Symptoms:   Elevated Mood:  {BHH YES OR NO:22294} Irritable Mood:  {BHH YES OR NO:22294} Grandiosity:  {BHH YES OR NO:22294} Distractibility:  {BHH YES OR NO:22294} Labiality of Mood:  {BHH YES OR NO:22294} Delusions:  {BHH YES OR NO:22294} Hallucinations:  {BHH YES OR NO:22294} Impulsivity:  {BHH YES OR NO:22294} Sexually Inappropriate Behavior:  {BHH YES OR NO:22294} Financial Extravagance:  {BHH YES OR NO:22294} Flight of Ideas:  {BHH YES OR NO:22294}  Anxiety Symptoms: Excessive Worry:  {BHH YES OR NO:22294} Panic Symptoms:  {BHH YES OR NO:22294} Agoraphobia:  {BHH YES OR NO:22294} Obsessive Compulsive: {BHH YES OR NO:22294}  Symptoms: {Obsessive Compulsive Symptoms:22671} Specific Phobias:  {BHH YES OR NO:22294} Social Anxiety:  {BHH YES OR NO:22294}  Psychotic Symptoms:  Hallucinations: {BHH YES OR NO:22294} {Hallucinations:22672} Delusions:  {BHH YES OR NO:22294} Paranoia:  {BHH YES OR NO:22294}   Ideas of Reference:  {BHH YES OR NO:22294}  PTSD Symptoms: Ever had a traumatic exposure:  {BHH YES OR NO:22294} Had a traumatic exposure in the last month:  {BHH YES OR NO:22294} Re-experiencing: {BHH YES OR NO:22294} {Re-experiencing:22673} Hypervigilance:  {BHH YES OR NO:22294} Hyperarousal: {BHH YES OR NO:22294} {Hyperarousal:22674} Avoidance: {BHH YES OR NO:22294} {Avoidance:22675}  Traumatic Brain Injury: {BHH YES OR NO:22294} {Traumatic Brain Injury:22676}  Past Psychiatric History: Diagnosis: ***  Hospitalizations: ***  Outpatient Care: ***  Substance Abuse Care: ***  Self-Mutilation: ***  Suicidal Attempts: ***  Violent Behaviors: ***   Past Medical History:   Past  Medical History  Diagnosis Date  . 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 Anxiety   . Depression   . PTSD (post-traumatic stress disorder)   . Substance abuse     Remote history- cocaine, ETOH, Marijuana  . Hepatitis C     ? Contracted through IVDA  . Thyroid disease   . Chronic pain   . Arthritis   . Angiomyolipoma     Left kidney  . Panic 12/12/1967  . Panic 04/29/1999   History of Loss of Consciousness:  {BHH YES OR NO:22294} Seizure History:  {BHH YES OR NO:22294} Cardiac History:  {BHH YES OR NO:22294} Allergies:   Allergies  Allergen Reactions  . Oxycodone Anxiety  . Abilify [Aripiprazole]     Stiff neck  . Ampicillin     rash  . Bactrim [Sulfamethoxazole-Trimethoprim]     Tongue swelled  . Gabapentin     Dizziness to the point she actually fell   Current Medications:  Current Outpatient Prescriptions  Medication Sig Dispense Refill  . ALPRAZolam (XANAX) 1 MG tablet Take 1 tablet (1 mg total) by mouth 4 (four) times daily.  120 tablet  2  . ARIPiprazole (ABILIFY) 5 MG tablet Take 1 tablet (5 mg total) by mouth daily.  30 tablet  2  . ESTRACE VAGINAL 0.1 MG/GM vaginal cream       . HYDROcodone-acetaminophen (NORCO) 10-325 MG per tablet Take 1 tablet by mouth every 5 (five) hours as needed.  150 tablet  0  . meloxicam (MOBIC) 7.5 MG tablet Take 1 tablet (7.5 mg total) by mouth daily.  30 tablet  1  . sertraline (ZOLOFT) 100 MG tablet Take 1 tablet (100 mg total) by mouth 2 (two) times daily.  60 tablet  2  . traZODone (DESYREL) 100 MG tablet Take 1-2 tablets (100-200 mg total) by mouth at bedtime.  60 tablet  2   No current facility-administered medications for this visit.    Previous Psychotropic Medications:  Medication Dose   ***  ***                     Substance Abuse History in the last 12 months: Substance Age of 1st Use Last Use Amount Specific Type  Nicotine  ***  ***  ***  ***  Alcohol  ***  ***  ***  ***  Cannabis  ***  ***  ***  ***  Opiates  ***  ***   ***  ***  Cocaine  ***  ***  ***  ***  Methamphetamines  ***  ***  ***  ***  LSD  ***  ***  ***  ***  Ecstasy  ***   ***  ***  ***  Benzodiazepines  ***  ***  ***  ***  Caffeine  ***  ***  ***  ***  Inhalants  ***  ***  ***  ***  Others:                          Medical Consequences of Substance Abuse: ***  Legal Consequences of Substance Abuse: ***  Family Consequences of Substance Abuse: ***  Blackouts:  {BHH YES OR NO:22294} DT's:  {BHH YES OR NO:22294} Withdrawal Symptoms:  {BHH YES OR NO:22294} {Withdrawal Symptoms:22677}  Social History: Current Place of Residence: *** Place of Birth: *** Family Members: *** Marital Status:  {Marital Status:22678} Children: ***  Sons: ***  Daughters: *** Relationships: *** Education:  {Education:22679} Educational Problems/Performance: *** Religious Beliefs/Practices: *** History of Abuse: {Desc; abuse:16542} Occupational Experiences; Military History:  {Military History:22680} Legal History: *** Hobbies/Interests: ***  Family History:   Family History  Problem Relation Age of Onset  . Depression Mother   . Depression Sister   . Anxiety disorder Sister   . Bipolar disorder Sister   . Depression Maternal Grandmother   . ADD / ADHD Neg Hx   . Alcohol abuse Neg Hx   . Drug abuse Neg Hx   . Dementia Neg Hx   . OCD Neg Hx   . Paranoid behavior Neg Hx   . Schizophrenia Neg Hx   . Seizures Neg Hx   . Sexual abuse Neg Hx   . Physical abuse Neg Hx   . Suicidality Neg Hx     Mental Status Examination/Evaluation: Objective:  Appearance: {Appearance:22683}  Eye Contact::  {BHH EYE CONTACT:22684}  Speech:  {Speech:22685}  Volume:  {Volume (PAA):22686}  Mood:  ***  Affect:  {Affect (PAA):22687}  Thought Process:  {Thought Process (PAA):22688}  Orientation:  {BHH ORIENTATION (PAA):22689}  Thought Content:  {Thought Content:22690}  Suicidal Thoughts:  {ST/HT (PAA):22692}  Homicidal Thoughts:  {ST/HT (PAA):22692}   Judgement:  {Judgement (PAA):22694}  Insight:  {Insight (PAA):22695}  Psychomotor Activity:  {Psychomotor (PAA):22696}  Akathisia:  {BHH YES OR NO:22294}  Handed:  {Handed:22697}  AIMS (if indicated):  ***  Assets:  {Assets (PAA):22698}    Laboratory/X-Ray Psychological Evaluation(s)   ***  ***   Assessment:  {axis diagnosis:3049000}  AXIS I {psych axis 1:31909}  AXIS II {psych axis 2:31910}  AXIS III Past Medical History  Diagnosis Date  . 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 Anxiety   . Depression   . PTSD (post-traumatic stress disorder)   .  Substance abuse     Remote history- cocaine, ETOH, Marijuana  . Hepatitis C     ? Contracted through IVDA  . Thyroid disease   . Chronic pain   . Arthritis   . Angiomyolipoma     Left kidney  . Panic 12/12/1967  . Panic 04/29/1999     AXIS IV {psych axis iv:31915}  AXIS V {psych axis v score:31919}   Treatment Plan/Recommendations:  Plan of Care: ***  Laboratory:  {Laboratory:22682}  Psychotherapy: ***  Medications: ***  Routine PRN Medications:  {BHH YES OR NO:22294}  Consultations: ***  Safety Concerns:  ***  Other:      Dannae Kato, LCSW 3/10/201511:23 AM

## 2013-07-05 NOTE — Progress Notes (Deleted)
Psychiatric Assessment Adult  Patient Identification:  Kathleen Palmer Date of Evaluation:  07/05/2013 Chief Complaint: *** History of Chief Complaint:  No chief complaint on file.   HPI Review of Systems Physical Exam  Depressive Symptoms: {DEPRESSION SYMPTOMS:20000}  (Hypo) Manic Symptoms:   Elevated Mood:  {BHH YES OR NO:22294} Irritable Mood:  {BHH YES OR NO:22294} Grandiosity:  {BHH YES OR NO:22294} Distractibility:  {BHH YES OR NO:22294} Labiality of Mood:  {BHH YES OR NO:22294} Delusions:  {BHH YES OR NO:22294} Hallucinations:  {BHH YES OR NO:22294} Impulsivity:  {BHH YES OR NO:22294} Sexually Inappropriate Behavior:  {BHH YES OR NO:22294} Financial Extravagance:  {BHH YES OR NO:22294} Flight of Ideas:  {BHH YES OR NO:22294}  Anxiety Symptoms: Excessive Worry:  {BHH YES OR NO:22294} Panic Symptoms:  {BHH YES OR NO:22294} Agoraphobia:  {BHH YES OR NO:22294} Obsessive Compulsive: {BHH YES OR NO:22294}  Symptoms: {Obsessive Compulsive Symptoms:22671} Specific Phobias:  {BHH YES OR NO:22294} Social Anxiety:  {BHH YES OR NO:22294}  Psychotic Symptoms:  Hallucinations: {BHH YES OR NO:22294} {Hallucinations:22672} Delusions:  {BHH YES OR NO:22294} Paranoia:  {BHH YES OR NO:22294}   Ideas of Reference:  {BHH YES OR NO:22294}  PTSD Symptoms: Ever had a traumatic exposure:  {BHH YES OR NO:22294} Had a traumatic exposure in the last month:  {BHH YES OR NO:22294} Re-experiencing: {BHH YES OR NO:22294} {Re-experiencing:22673} Hypervigilance:  {BHH YES OR NO:22294} Hyperarousal: {BHH YES OR NO:22294} {Hyperarousal:22674} Avoidance: {BHH YES OR NO:22294} {Avoidance:22675}  Traumatic Brain Injury: {BHH YES OR NO:22294} {Traumatic Brain Injury:22676}  Past Psychiatric History: Diagnosis: ***  Hospitalizations: ***  Outpatient Care: ***  Substance Abuse Care: ***  Self-Mutilation: ***  Suicidal Attempts: ***  Violent Behaviors: ***   Past Medical History:   Past  Medical History  Diagnosis Date  . 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 Anxiety   . Depression   . PTSD (post-traumatic stress disorder)   . Substance abuse     Remote history- cocaine, ETOH, Marijuana  . Hepatitis C     ? Contracted through IVDA  . Thyroid disease   . Chronic pain   . Arthritis   . Angiomyolipoma     Left kidney  . Panic 12/12/1967  . Panic 04/29/1999   History of Loss of Consciousness:  {BHH YES OR NO:22294} Seizure History:  {BHH YES OR NO:22294} Cardiac History:  {BHH YES OR NO:22294} Allergies:   Allergies  Allergen Reactions  . Oxycodone Anxiety  . Abilify [Aripiprazole]     Stiff neck  . Ampicillin     rash  . Bactrim [Sulfamethoxazole-Trimethoprim]     Tongue swelled  . Gabapentin     Dizziness to the point she actually fell   Current Medications:  Current Outpatient Prescriptions  Medication Sig Dispense Refill  . ALPRAZolam (XANAX) 1 MG tablet Take 1 tablet (1 mg total) by mouth 4 (four) times daily.  120 tablet  2  . ARIPiprazole (ABILIFY) 5 MG tablet Take 1 tablet (5 mg total) by mouth daily.  30 tablet  2  . ESTRACE VAGINAL 0.1 MG/GM vaginal cream       . HYDROcodone-acetaminophen (NORCO) 10-325 MG per tablet Take 1 tablet by mouth every 5 (five) hours as needed.  150 tablet  0  . meloxicam (MOBIC) 7.5 MG tablet Take 1 tablet (7.5 mg total) by mouth daily.  30 tablet  1  . sertraline (ZOLOFT) 100 MG tablet Take 1 tablet (100 mg total) by mouth 2 (two) times daily.  60 tablet  2  . traZODone (DESYREL) 100 MG tablet Take 1-2 tablets (100-200 mg total) by mouth at bedtime.  60 tablet  2   No current facility-administered medications for this visit.    Previous Psychotropic Medications:  Medication Dose   ***  ***                     Substance Abuse History in the last 12 months: Substance Age of 1st Use Last Use Amount Specific Type  Nicotine  ***  ***  ***  ***  Alcohol  ***  ***  ***  ***  Cannabis  ***  ***  ***  ***  Opiates  ***  ***   ***  ***  Cocaine  ***  ***  ***  ***  Methamphetamines  ***  ***  ***  ***  LSD  ***  ***  ***  ***  Ecstasy  ***   ***  ***  ***  Benzodiazepines  ***  ***  ***  ***  Caffeine  ***  ***  ***  ***  Inhalants  ***  ***  ***  ***  Others:                          Medical Consequences of Substance Abuse: ***  Legal Consequences of Substance Abuse: ***  Family Consequences of Substance Abuse: ***  Blackouts:  {BHH YES OR NO:22294} DT's:  {BHH YES OR NO:22294} Withdrawal Symptoms:  {BHH YES OR NO:22294} {Withdrawal Symptoms:22677}  Social History: Current Place of Residence: *** Place of Birth: *** Family Members: *** Marital Status:  {Marital Status:22678} Children: ***  Sons: ***  Daughters: *** Relationships: *** Education:  {Education:22679} Educational Problems/Performance: *** Religious Beliefs/Practices: *** History of Abuse: {Desc; abuse:16542} Occupational Experiences; Military History:  {Military History:22680} Legal History: *** Hobbies/Interests: ***  Family History:   Family History  Problem Relation Age of Onset  . Depression Mother   . Depression Sister   . Anxiety disorder Sister   . Bipolar disorder Sister   . Depression Maternal Grandmother   . ADD / ADHD Neg Hx   . Alcohol abuse Neg Hx   . Drug abuse Neg Hx   . Dementia Neg Hx   . OCD Neg Hx   . Paranoid behavior Neg Hx   . Schizophrenia Neg Hx   . Seizures Neg Hx   . Sexual abuse Neg Hx   . Physical abuse Neg Hx   . Suicidality Neg Hx     Mental Status Examination/Evaluation: Objective:  Appearance: {Appearance:22683}  Eye Contact::  {BHH EYE CONTACT:22684}  Speech:  {Speech:22685}  Volume:  {Volume (PAA):22686}  Mood:  ***  Affect:  {Affect (PAA):22687}  Thought Process:  {Thought Process (PAA):22688}  Orientation:  {BHH ORIENTATION (PAA):22689}  Thought Content:  {Thought Content:22690}  Suicidal Thoughts:  {ST/HT (PAA):22692}  Homicidal Thoughts:  {ST/HT (PAA):22692}   Judgement:  {Judgement (PAA):22694}  Insight:  {Insight (PAA):22695}  Psychomotor Activity:  {Psychomotor (PAA):22696}  Akathisia:  {BHH YES OR NO:22294}  Handed:  {Handed:22697}  AIMS (if indicated):  ***  Assets:  {Assets (PAA):22698}    Laboratory/X-Ray Psychological Evaluation(s)   ***  ***   Assessment:  {axis diagnosis:3049000}  AXIS I {psych axis 1:31909}  AXIS II {psych axis 2:31910}  AXIS III Past Medical History  Diagnosis Date  . 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 Anxiety   . Depression   . PTSD (post-traumatic stress disorder)   .  Substance abuse     Remote history- cocaine, ETOH, Marijuana  . Hepatitis C     ? Contracted through IVDA  . Thyroid disease   . Chronic pain   . Arthritis   . Angiomyolipoma     Left kidney  . Panic 12/12/1967  . Panic 04/29/1999     AXIS IV {psych axis iv:31915}  AXIS V {psych axis v score:31919}   Treatment Plan/Recommendations:  Plan of Care: ***  Laboratory:  {Laboratory:22682}  Psychotherapy: ***  Medications: ***  Routine PRN Medications:  {BHH YES OR NO:22294}  Consultations: ***  Safety Concerns:  ***  Other:      Makeisha Jentsch, LCSW 3/10/201511:22 AM

## 2013-07-05 NOTE — Progress Notes (Deleted)
   THERAPIST PROGRESS NOTE  Session Time: ***  Participation Level: {BHH PARTICIPATION LEVEL:22264}  Behavioral Response: {Appearance:22683}{BHH LEVEL OF CONSCIOUSNESS:22305}{BHH MOOD:22306}  Type of Therapy: {CHL AMB BH Type of Therapy:21022741}  Treatment Goals addressed: {CHL AMB BH Treatment Goals Addressed:21022754}  Interventions: {CHL AMB BH Type of Intervention:21022753}  Summary: Kathleen Palmer is a 52 y.o. female who presents with ***.   Suicidal/Homicidal: {BHH YES OR NO:22294}{yes/no/with/without intent/plan:22693}  Therapist Response: ***  Plan: Return again in *** weeks.  Diagnosis: Axis I: {psych axis 1:31909}    Axis II: {psych axis 2:31910}    Cloa Bushong, LCSW 07/05/2013  

## 2013-07-05 NOTE — Progress Notes (Deleted)
Psychiatric Assessment Adult  Patient Identification:  GIDGET QUIZHPI Date of Evaluation:  07/05/2013 Chief Complaint: *** History of Chief Complaint:  No chief complaint on file.   HPI Review of Systems Physical Exam  Depressive Symptoms: {DEPRESSION SYMPTOMS:20000}  (Hypo) Manic Symptoms:   Elevated Mood:  {BHH YES OR NO:22294} Irritable Mood:  {BHH YES OR NO:22294} Grandiosity:  {BHH YES OR NO:22294} Distractibility:  {BHH YES OR NO:22294} Labiality of Mood:  {BHH YES OR NO:22294} Delusions:  {BHH YES OR NO:22294} Hallucinations:  {BHH YES OR NO:22294} Impulsivity:  {BHH YES OR NO:22294} Sexually Inappropriate Behavior:  {BHH YES OR NO:22294} Financial Extravagance:  {BHH YES OR NO:22294} Flight of Ideas:  {BHH YES OR NO:22294}  Anxiety Symptoms: Excessive Worry:  {BHH YES OR NO:22294} Panic Symptoms:  {BHH YES OR NO:22294} Agoraphobia:  {BHH YES OR NO:22294} Obsessive Compulsive: {BHH YES OR NO:22294}  Symptoms: {Obsessive Compulsive Symptoms:22671} Specific Phobias:  {BHH YES OR NO:22294} Social Anxiety:  {BHH YES OR NO:22294}  Psychotic Symptoms:  Hallucinations: {BHH YES OR NO:22294} {Hallucinations:22672} Delusions:  {BHH YES OR NO:22294} Paranoia:  {BHH YES OR NO:22294}   Ideas of Reference:  {BHH YES OR NO:22294}  PTSD Symptoms: Ever had a traumatic exposure:  {BHH YES OR NO:22294} Had a traumatic exposure in the last month:  {BHH YES OR NO:22294} Re-experiencing: {BHH YES OR NO:22294} {Re-experiencing:22673} Hypervigilance:  {BHH YES OR NO:22294} Hyperarousal: {BHH YES OR NO:22294} {Hyperarousal:22674} Avoidance: {BHH YES OR NO:22294} {Avoidance:22675}  Traumatic Brain Injury: {BHH YES OR NO:22294} {Traumatic Brain Injury:22676}  Past Psychiatric History: Diagnosis: ***  Hospitalizations: ***  Outpatient Care: ***  Substance Abuse Care: ***  Self-Mutilation: ***  Suicidal Attempts: ***  Violent Behaviors: ***   Past Medical History:   Past  Medical History  Diagnosis Date  . 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 Anxiety   . Depression   . PTSD (post-traumatic stress disorder)   . Substance abuse     Remote history- cocaine, ETOH, Marijuana  . Hepatitis C     ? Contracted through IVDA  . Thyroid disease   . Chronic pain   . Arthritis   . Angiomyolipoma     Left kidney  . Panic 12/12/1967  . Panic 04/29/1999   History of Loss of Consciousness:  {BHH YES OR NO:22294} Seizure History:  {BHH YES OR NO:22294} Cardiac History:  {BHH YES OR NO:22294} Allergies:   Allergies  Allergen Reactions  . Oxycodone Anxiety  . Abilify [Aripiprazole]     Stiff neck  . Ampicillin     rash  . Bactrim [Sulfamethoxazole-Trimethoprim]     Tongue swelled  . Gabapentin     Dizziness to the point she actually fell   Current Medications:  Current Outpatient Prescriptions  Medication Sig Dispense Refill  . ALPRAZolam (XANAX) 1 MG tablet Take 1 tablet (1 mg total) by mouth 4 (four) times daily.  120 tablet  2  . ARIPiprazole (ABILIFY) 5 MG tablet Take 1 tablet (5 mg total) by mouth daily.  30 tablet  2  . ESTRACE VAGINAL 0.1 MG/GM vaginal cream       . HYDROcodone-acetaminophen (NORCO) 10-325 MG per tablet Take 1 tablet by mouth every 5 (five) hours as needed.  150 tablet  0  . meloxicam (MOBIC) 7.5 MG tablet Take 1 tablet (7.5 mg total) by mouth daily.  30 tablet  1  . sertraline (ZOLOFT) 100 MG tablet Take 1 tablet (100 mg total) by mouth 2 (two) times daily.  60 tablet  2  . traZODone (DESYREL) 100 MG tablet Take 1-2 tablets (100-200 mg total) by mouth at bedtime.  60 tablet  2   No current facility-administered medications for this visit.    Previous Psychotropic Medications:  Medication Dose   ***  ***                     Substance Abuse History in the last 12 months: Substance Age of 1st Use Last Use Amount Specific Type  Nicotine  ***  ***  ***  ***  Alcohol  ***  ***  ***  ***  Cannabis  ***  ***  ***  ***  Opiates  ***  ***   ***  ***  Cocaine  ***  ***  ***  ***  Methamphetamines  ***  ***  ***  ***  LSD  ***  ***  ***  ***  Ecstasy  ***   ***  ***  ***  Benzodiazepines  ***  ***  ***  ***  Caffeine  ***  ***  ***  ***  Inhalants  ***  ***  ***  ***  Others:                          Medical Consequences of Substance Abuse: ***  Legal Consequences of Substance Abuse: ***  Family Consequences of Substance Abuse: ***  Blackouts:  {BHH YES OR NO:22294} DT's:  {BHH YES OR NO:22294} Withdrawal Symptoms:  {BHH YES OR NO:22294} {Withdrawal Symptoms:22677}  Social History: Current Place of Residence: *** Place of Birth: *** Family Members: *** Marital Status:  {Marital Status:22678} Children: ***  Sons: ***  Daughters: *** Relationships: *** Education:  {Education:22679} Educational Problems/Performance: *** Religious Beliefs/Practices: *** History of Abuse: {Desc; abuse:16542} Occupational Experiences; Military History:  {Military History:22680} Legal History: *** Hobbies/Interests: ***  Family History:   Family History  Problem Relation Age of Onset  . Depression Mother   . Depression Sister   . Anxiety disorder Sister   . Bipolar disorder Sister   . Depression Maternal Grandmother   . ADD / ADHD Neg Hx   . Alcohol abuse Neg Hx   . Drug abuse Neg Hx   . Dementia Neg Hx   . OCD Neg Hx   . Paranoid behavior Neg Hx   . Schizophrenia Neg Hx   . Seizures Neg Hx   . Sexual abuse Neg Hx   . Physical abuse Neg Hx   . Suicidality Neg Hx     Mental Status Examination/Evaluation: Objective:  Appearance: {Appearance:22683}  Eye Contact::  {BHH EYE CONTACT:22684}  Speech:  {Speech:22685}  Volume:  {Volume (PAA):22686}  Mood:  ***  Affect:  {Affect (PAA):22687}  Thought Process:  {Thought Process (PAA):22688}  Orientation:  {BHH ORIENTATION (PAA):22689}  Thought Content:  {Thought Content:22690}  Suicidal Thoughts:  {ST/HT (PAA):22692}  Homicidal Thoughts:  {ST/HT (PAA):22692}   Judgement:  {Judgement (PAA):22694}  Insight:  {Insight (PAA):22695}  Psychomotor Activity:  {Psychomotor (PAA):22696}  Akathisia:  {BHH YES OR NO:22294}  Handed:  {Handed:22697}  AIMS (if indicated):  ***  Assets:  {Assets (PAA):22698}    Laboratory/X-Ray Psychological Evaluation(s)   ***  ***   Assessment:  {axis diagnosis:3049000}  AXIS I {psych axis 1:31909}  AXIS II {psych axis 2:31910}  AXIS III Past Medical History  Diagnosis Date  . 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 Anxiety   . Depression   . PTSD (post-traumatic stress disorder)   .  Substance abuse     Remote history- cocaine, ETOH, Marijuana  . Hepatitis C     ? Contracted through IVDA  . Thyroid disease   . Chronic pain   . Arthritis   . Angiomyolipoma     Left kidney  . Panic 12/12/1967  . Panic 04/29/1999     AXIS IV {psych axis iv:31915}  AXIS V {psych axis v score:31919}   Treatment Plan/Recommendations:  Plan of Care: ***  Laboratory:  {Laboratory:22682}  Psychotherapy: ***  Medications: ***  Routine PRN Medications:  {BHH YES OR NO:22294}  Consultations: ***  Safety Concerns:  ***  Other:      Erionna Strum, LCSW 3/10/201511:59 AM

## 2013-07-06 ENCOUNTER — Ambulatory Visit (HOSPITAL_COMMUNITY): Payer: Self-pay | Admitting: Psychiatry

## 2013-07-07 ENCOUNTER — Ambulatory Visit (HOSPITAL_COMMUNITY): Payer: Self-pay

## 2013-07-08 ENCOUNTER — Telehealth (HOSPITAL_COMMUNITY): Payer: Self-pay | Admitting: *Deleted

## 2013-07-11 ENCOUNTER — Encounter: Payer: Self-pay | Admitting: *Deleted

## 2013-07-15 ENCOUNTER — Ambulatory Visit (HOSPITAL_COMMUNITY): Payer: Self-pay | Admitting: Psychiatry

## 2013-07-19 ENCOUNTER — Ambulatory Visit (INDEPENDENT_AMBULATORY_CARE_PROVIDER_SITE_OTHER): Payer: Medicare Other | Admitting: Psychiatry

## 2013-07-19 DIAGNOSIS — F3289 Other specified depressive episodes: Secondary | ICD-10-CM

## 2013-07-19 DIAGNOSIS — F329 Major depressive disorder, single episode, unspecified: Secondary | ICD-10-CM | POA: Diagnosis not present
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
## 2013-07-19 NOTE — Patient Instructions (Signed)
Discussed orally 

## 2013-07-22 ENCOUNTER — Ambulatory Visit (INDEPENDENT_AMBULATORY_CARE_PROVIDER_SITE_OTHER): Payer: Medicare Other | Admitting: Psychiatry

## 2013-07-22 ENCOUNTER — Encounter (HOSPITAL_COMMUNITY): Payer: Self-pay | Admitting: Psychiatry

## 2013-07-22 VITALS — BP 130/90 | Ht 63.0 in | Wt 191.0 lb

## 2013-07-22 DIAGNOSIS — F1210 Cannabis abuse, uncomplicated: Secondary | ICD-10-CM

## 2013-07-22 DIAGNOSIS — F39 Unspecified mood [affective] disorder: Secondary | ICD-10-CM

## 2013-07-22 DIAGNOSIS — F5105 Insomnia due to other mental disorder: Secondary | ICD-10-CM

## 2013-07-22 DIAGNOSIS — F329 Major depressive disorder, single episode, unspecified: Secondary | ICD-10-CM

## 2013-07-22 DIAGNOSIS — F3289 Other specified depressive episodes: Secondary | ICD-10-CM

## 2013-07-22 MED ORDER — TRAZODONE HCL 100 MG PO TABS: 100.0000 mg | ORAL_TABLET | Freq: Every day | ORAL | Status: DC

## 2013-07-22 MED ORDER — ALPRAZOLAM 1 MG PO TABS: 1.0000 mg | ORAL_TABLET | Freq: Four times a day (QID) | ORAL | Status: DC

## 2013-07-22 MED ORDER — FLUOXETINE HCL 20 MG PO CAPS: ORAL_CAPSULE | ORAL | Status: DC
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
## 2013-07-25 ENCOUNTER — Telehealth: Payer: Self-pay | Admitting: Family Medicine

## 2013-07-25 MED ORDER — HYDROCODONE-ACETAMINOPHEN 10-325 MG PO TABS: 1.0000 | ORAL_TABLET | ORAL | Status: DC | PRN

## 2013-07-25 NOTE — Telephone Encounter (Signed)
Call back number is [phone] Pt is needing a refill on HYDROcodone-acetaminophen (NORCO) 10-325 MG per tablet

## 2013-07-25 NOTE — Telephone Encounter (Signed)
Okay to refill, needs appt before any further refills

## 2013-07-25 NOTE — Telephone Encounter (Signed)
Ok to refill??  Last office visit 03/25/2013.  Last refill 06/24/2013.

## 2013-07-25 NOTE — Telephone Encounter (Signed)
Prescription printed and patient made aware to come to office to pick up.   Appointment scheduled.  

## 2013-08-01 ENCOUNTER — Encounter: Payer: Self-pay | Admitting: Family Medicine

## 2013-08-01 ENCOUNTER — Ambulatory Visit (INDEPENDENT_AMBULATORY_CARE_PROVIDER_SITE_OTHER): Payer: Medicare Other | Admitting: Family Medicine

## 2013-08-01 VITALS — BP 132/74 | HR 78 | Temp 98.1°F | Resp 14 | Ht 62.5 in | Wt 193.0 lb

## 2013-08-01 DIAGNOSIS — G894 Chronic pain syndrome: Secondary | ICD-10-CM

## 2013-08-01 DIAGNOSIS — L723 Sebaceous cyst: Secondary | ICD-10-CM

## 2013-08-01 DIAGNOSIS — N9089 Other specified noninflammatory disorders of vulva and perineum: Secondary | ICD-10-CM

## 2013-08-01 DIAGNOSIS — M25569 Pain in unspecified knee: Secondary | ICD-10-CM

## 2013-08-01 DIAGNOSIS — N898 Other specified noninflammatory disorders of vagina: Secondary | ICD-10-CM | POA: Diagnosis not present

## 2013-08-01 DIAGNOSIS — L720 Epidermal cyst: Secondary | ICD-10-CM | POA: Insufficient documentation

## 2013-08-01 DIAGNOSIS — F32A Depression, unspecified: Secondary | ICD-10-CM

## 2013-08-01 DIAGNOSIS — F329 Major depressive disorder, single episode, unspecified: Secondary | ICD-10-CM

## 2013-08-01 DIAGNOSIS — F3289 Other specified depressive episodes: Secondary | ICD-10-CM

## 2013-08-01 LAB — WET PREP FOR TRICH, YEAST, CLUE
Clue Cells Wet Prep HPF POC: NONE SEEN
TRICH WET PREP: NONE SEEN
WBC, Wet Prep HPF POC: NONE SEEN
YEAST WET PREP: NONE SEEN

## 2013-08-01 NOTE — Patient Instructions (Signed)
Xray of knee at [facility] We will call with cultures results Use the estrace cream every other day  F/U 4 months

## 2013-08-01 NOTE — Assessment & Plan Note (Signed)
She has chronic irritation secondary to chronic discharge which she has been evaluated by GYN as well as urology and there is nothing that can be done at this time. They did advise her to use estrogen cream which she typically only uses once a week and they advised every other day. I did take swabs for discharge today including wet prep which was normal and an actual skin culture as she does not have any coverage as she has no clitoral hood do to her vaginal reconstruction.

## 2013-08-01 NOTE — Assessment & Plan Note (Signed)
She continues to follow with psychiatry and is making some headway. She's journaling which also improves her mood some

## 2013-08-01 NOTE — Progress Notes (Signed)
Patient ID: Kathleen Palmer, female   DOB: 24-Sep-1960, 53 y.o.   MRN: [id number]   Subjective:    Patient ID: Kathleen Palmer, female    DOB: [birth year], 53 y.o.   MRN: [id number]  Patient presents for Medication review, R knee pain and Vaginal discharge  patient here follow chronic medical problems. She's been having increased vaginal discharge pneumococcal region as well as irritation. This is been making her more depressed. She was seen by urology and GYN at [facility] and they cannot find any thing to improve her condition with exception of uses Estrace cream. She's not had any discharge from her vaginal entrance. She has had some bleeding around the clitoris.  She continues to have right knee pain states it is getting worse and it feels like it is giving out on her and that it swells. She's not had any imaging of the knee. She is using her pain medications as prescribed. She did try the anti-inflammatories but does not get any improvement from them.  She noticed a small bump on her lower back a couple weeks ago she will like me to look at. She's not had any discharge from it  Maj. depression/PTSD she is being followed by psychiatry she states her medications haven't changed 3 times in the past couple months to help her mood. She's not felt suicidal but has been very depressed. They have her journaling every day which she thinks helps.    Review Of Systems:  GEN- denies fatigue, fever, weight loss,weakness, recent illness HEENT- denies eye drainage, change in vision, nasal discharge, CVS- denies chest pain, palpitations RESP- denies SOB, cough, wheeze ABD- denies N/V, change in stools, abd pain GU- denies dysuria, hematuria, dribbling, incontinence MSK- +joint pain, muscle aches, injury Neuro- denies headache, dizziness, syncope, seizure activity       Objective:    BP 132/74  Pulse 78  Temp(Src) 98.1 F (36.7 C) (Oral)  Resp 14  Ht 5' 2.5" (1.588 m)  Wt 193 lb (87.544 kg)   BMI 34.72 kg/m2 GEN- NAD, alert and oriented x3 HEENT- PERRL, EOMI, non injected sclera, pink conjunctiva, MMM, oropharynx clear Neck- Supple, CVS- RRR, no murmur RESP-CTAB ABD-- urethral stump/ilieoconduit osteomy- pink and moist, NABS,soft,NT, ventral hernia GU- clitoris irritated appearing with discharge and mild bleeding, no clitoral hood, multiple scars in genital region, vaginal introitus with pink tissue, atrophy noted MSK- Bilateral knees normal inspection, Right knee- no effusion, fair ROM, no popliteal fullness or pain, mild TTP lateral aspect of knee, ligaments in tact EXT- No edema Pulses- Radial 2+ EKG- NSR, no ST changes Psych- depressed affect, not anxious appearing, no SI, good eye contact     Assessment & Plan:      Problem List Items Addressed This Visit   Knee pain     Persistent right knee pain. I will get an x-ray of her knee she will also be referred to orthopedics.    Relevant Orders      DG Knee Complete 4 Views Right   Epidermoid cyst of skin     Small skin cyst on her lower back. I think this is benign and we can just watch this. If he gets any larger there may have to cut it out but it is very small at this time.    Depression     She continues to follow with psychiatry and is making some headway. She's journaling which also improves her mood some    Clitoral irritation  She has chronic irritation secondary to chronic discharge which she has been evaluated by GYN as well as urology and there is nothing that can be done at this time. They did advise her to use estrogen cream which she typically only uses once a week and they advised every other day. I did take swabs for discharge today including wet prep which was normal and an actual skin culture as she does not have any coverage as she has no clitoral hood do to her vaginal reconstruction.    Chronic pain syndrome     CONTINUE PAIN MEDS     Other Visit Diagnoses   Vaginal discharge    -  Primary     Relevant Orders       WET PREP FOR [place], YEAST, CLUE (Completed)       Wound culture       Note: This dictation was prepared with Dragon dictation along with smaller phrase technology. Any transcriptional errors that result from this process are unintentional.

## 2013-08-01 NOTE — Assessment & Plan Note (Signed)
Persistent right knee pain. I will get an x-ray of her knee she will also be referred to orthopedics.

## 2013-08-01 NOTE — Assessment & Plan Note (Signed)
Small skin cyst on her lower back. I think this is benign and we can just watch this. If he gets any larger there may have to cut it out but it is very small at this time.

## 2013-08-01 NOTE — Assessment & Plan Note (Signed)
CONTINUE PAIN MEDS

## 2013-08-03 LAB — WOUND CULTURE
GRAM STAIN: NONE SEEN
Gram Stain: NONE SEEN
Gram Stain: NONE SEEN

## 2013-08-04 ENCOUNTER — Ambulatory Visit (INDEPENDENT_AMBULATORY_CARE_PROVIDER_SITE_OTHER): Payer: Medicare Other | Admitting: Psychiatry

## 2013-08-04 ENCOUNTER — Encounter (HOSPITAL_COMMUNITY): Payer: Self-pay | Admitting: Psychiatry

## 2013-08-04 VITALS — BP 120/88 | Ht 63.0 in | Wt 191.0 lb

## 2013-08-04 DIAGNOSIS — F329 Major depressive disorder, single episode, unspecified: Secondary | ICD-10-CM

## 2013-08-04 DIAGNOSIS — F39 Unspecified mood [affective] disorder: Secondary | ICD-10-CM

## 2013-08-04 DIAGNOSIS — F1210 Cannabis abuse, uncomplicated: Secondary | ICD-10-CM

## 2013-08-04 DIAGNOSIS — F3289 Other specified depressive episodes: Secondary | ICD-10-CM
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
## 2013-08-05 ENCOUNTER — Ambulatory Visit (INDEPENDENT_AMBULATORY_CARE_PROVIDER_SITE_OTHER): Payer: Medicare Other | Admitting: Psychiatry

## 2013-08-05 DIAGNOSIS — F3289 Other specified depressive episodes: Secondary | ICD-10-CM | POA: Diagnosis not present

## 2013-08-05 DIAGNOSIS — F329 Major depressive disorder, single episode, unspecified: Secondary | ICD-10-CM | POA: Diagnosis not present

## 2013-08-05 NOTE — Patient Instructions (Signed)
Discussed orally 
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
## 2013-08-19 ENCOUNTER — Ambulatory Visit (INDEPENDENT_AMBULATORY_CARE_PROVIDER_SITE_OTHER): Payer: Medicare Other | Admitting: Psychiatry

## 2013-08-19 ENCOUNTER — Ambulatory Visit (HOSPITAL_COMMUNITY)
Admission: RE | Admit: 2013-08-19 | Discharge: 2013-08-19 | Disposition: A | Discharge status: Home / Self Care | Payer: Medicare Other | Source: Ambulatory Visit | Attending: Family Medicine | Admitting: Family Medicine

## 2013-08-19 DIAGNOSIS — M1710 Unilateral primary osteoarthritis, unspecified knee: Secondary | ICD-10-CM | POA: Diagnosis not present

## 2013-08-19 DIAGNOSIS — IMO0002 Reserved for concepts with insufficient information to code with codable children: Secondary | ICD-10-CM | POA: Insufficient documentation

## 2013-08-19 DIAGNOSIS — F329 Major depressive disorder, single episode, unspecified: Secondary | ICD-10-CM

## 2013-08-19 DIAGNOSIS — F3289 Other specified depressive episodes: Secondary | ICD-10-CM

## 2013-08-19 DIAGNOSIS — M25569 Pain in unspecified knee: Secondary | ICD-10-CM | POA: Diagnosis not present
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
## 2013-08-19 NOTE — Patient Instructions (Signed)
Discussed orally 

## 2013-08-22 ENCOUNTER — Other Ambulatory Visit: Payer: Self-pay | Admitting: *Deleted

## 2013-08-22 DIAGNOSIS — M1710 Unilateral primary osteoarthritis, unspecified knee: Secondary | ICD-10-CM

## 2013-08-22 DIAGNOSIS — M179 Osteoarthritis of knee, unspecified: Secondary | ICD-10-CM

## 2013-08-24 ENCOUNTER — Telehealth: Payer: Self-pay | Admitting: Family Medicine

## 2013-08-24 MED ORDER — HYDROCODONE-ACETAMINOPHEN 10-325 MG PO TABS: 1.0000 | ORAL_TABLET | ORAL | Status: DC | PRN

## 2013-08-24 NOTE — Telephone Encounter (Signed)
Requesting refill on norco

## 2013-08-24 NOTE — Telephone Encounter (Signed)
Prescription printed and patient made aware to come to office to pick up per VM.  

## 2013-08-24 NOTE — Telephone Encounter (Signed)
Okay to refill? 

## 2013-08-24 NOTE — Telephone Encounter (Signed)
Ok to refill??  Last office visit 08/01/2013.  Last refill 07/22/2013.

## 2013-08-25 ENCOUNTER — Ambulatory Visit (INDEPENDENT_AMBULATORY_CARE_PROVIDER_SITE_OTHER): Payer: Medicare Other | Admitting: Physician Assistant

## 2013-08-25 ENCOUNTER — Encounter: Payer: Self-pay | Admitting: Physician Assistant

## 2013-08-25 VITALS — BP 130/90 | HR 68 | Temp 98.2°F | Resp 18 | Wt 190.0 lb

## 2013-08-25 DIAGNOSIS — L259 Unspecified contact dermatitis, unspecified cause: Secondary | ICD-10-CM

## 2013-08-25 DIAGNOSIS — L239 Allergic contact dermatitis, unspecified cause: Secondary | ICD-10-CM

## 2013-08-25 MED ORDER — PREDNISONE 20 MG PO TABS: ORAL_TABLET | ORAL | Status: DC

## 2013-08-25 MED ORDER — METHYLPREDNISOLONE ACETATE 80 MG/ML IJ SUSP
80.0000 mg | Freq: Once | INTRAMUSCULAR | Status: AC
  Administered 2013-08-25: 80 mg via INTRAMUSCULAR

## 2013-08-25 NOTE — Progress Notes (Signed)
Patient ID: TEKESHA ALMGREN MRN: [id number], DOB: 09/05/1960, 53 y.o. Date of Encounter: 08/25/2013, 2:00 PM    Chief Complaint:  Chief Complaint  Patient presents with  . poison ivy all over     HPI: 53 y.o. year old female says that she was pulling up some roots. Says it was a day or 2 after this that she started to develop this rash. She is from [place] and is not familiar with problems with poison oak poison ivy et [place] Asters. Says this rash  started about 6 days ago around her right eye. Since then it has spread to her entire back-- even down into her buttocks and her right groin. It is now it is starting to spread to the left groin. As well she has some areas on her legs bilaterally. Covering pretty much the whole body- except for not her arms. Very itchy.     Home Meds: See attached medication section for any medications that were entered at today's visit. The computer does not put those onto this list.The following list is a list of meds entered prior to today's visit.   Current Outpatient Prescriptions on File Prior to Visit  Medication Sig Dispense Refill  . ALPRAZolam (XANAX) 1 MG tablet Take 1 tablet (1 mg total) by mouth 4 (four) times daily.  120 tablet  2  . ESTRACE VAGINAL 0.1 MG/GM vaginal cream       . FLUoxetine (PROZAC) 20 MG capsule Take one in the am, gradually increase to three in the am over one week  90 capsule  2  . HYDROcodone-acetaminophen (NORCO) 10-325 MG per tablet Take 1 tablet by mouth every 5 (five) hours as needed.  150 tablet  0  . traZODone (DESYREL) 100 MG tablet Take 1-2 tablets (100-200 mg total) by mouth at bedtime.  60 tablet  2   No current facility-administered medications on file prior to visit.    Allergies:  Allergies  Allergen Reactions  . Oxycodone Anxiety  . Abilify [Aripiprazole]     Stiff neck  . Ampicillin     rash  . Bactrim [Sulfamethoxazole-Trimethoprim]     Tongue swelled  . Gabapentin     Dizziness to the point she  actually fell      Review of Systems: See HPI for pertinent ROS. All other ROS negative.    Physical Exam: Blood pressure 130/90, pulse 68, temperature 98.2 F (36.8 C), temperature source Oral, resp. rate 18, weight 190 lb (86.183 kg)., Body mass index is 33.67 kg/(m^2). General:  Overweight white female . Appears in no acute distress. Lungs: Clear bilaterally to auscultation without wheezes, rales, or rhonchi. Breathing is unlabored. Heart: Regular rhythm. No murmurs, rubs, or gallops. Msk:  Strength and tone normal for age. Skin: Area around right eye has mild swelling and diffuse pink erythema.  Entire back, down covering her buttocks--with splotchy, scattered pink papules. Right Groin with 2 inch area of diffuse deep pink erythema. Some erythema left groin. Scattered pink papules on both legs.  Neuro: Alert and oriented X 3. Moves all extremities spontaneously. Gait is normal. CNII-XII grossly in tact. Psych:  Responds to questions appropriately with a normal affect.     ASSESSMENT AND PLAN:  53 y.o. year old female with  1. Allergic dermatitis Give Depo-Medrol 80 mg IM now. Start oral prednisone taper tomorrow. F/U if  itching and rash does not resolve by the end of the taper. - predniSONE (DELTASONE) 20 MG tablet; Take 3 daily  for 2 days, then 2 daily for 2 days, then 1 daily for 2 days.  Dispense: 12 tablet; Refill: 0 - methylPREDNISolone acetate (DEPO-MEDROL) injection 80 mg; Inject 1 mL (80 mg total) into the muscle once.   Signed, [address] [place], [place], BSFM 08/25/2013 2:00 PM

## 2013-08-30 ENCOUNTER — Ambulatory Visit: Payer: Self-pay | Admitting: Family Medicine

## 2013-09-01 ENCOUNTER — Ambulatory Visit (HOSPITAL_COMMUNITY): Payer: Self-pay | Admitting: Psychiatry

## 2013-09-05 ENCOUNTER — Ambulatory Visit: Payer: Medicare Other | Admitting: Family Medicine

## 2013-09-06 ENCOUNTER — Ambulatory Visit (HOSPITAL_COMMUNITY): Payer: Self-pay | Admitting: Psychiatry

## 2013-09-07 ENCOUNTER — Ambulatory Visit (HOSPITAL_COMMUNITY): Payer: Self-pay | Admitting: Psychiatry

## 2013-09-14 ENCOUNTER — Encounter (HOSPITAL_COMMUNITY): Payer: Self-pay | Admitting: Psychiatry

## 2013-09-14 ENCOUNTER — Ambulatory Visit (INDEPENDENT_AMBULATORY_CARE_PROVIDER_SITE_OTHER): Payer: Medicare Other | Admitting: Psychiatry

## 2013-09-14 VITALS — BP 100/80 | Ht 63.0 in | Wt 185.0 lb

## 2013-09-14 DIAGNOSIS — F1210 Cannabis abuse, uncomplicated: Secondary | ICD-10-CM

## 2013-09-14 DIAGNOSIS — F5105 Insomnia due to other mental disorder: Secondary | ICD-10-CM

## 2013-09-14 DIAGNOSIS — F39 Unspecified mood [affective] disorder: Secondary | ICD-10-CM

## 2013-09-14 DIAGNOSIS — F329 Major depressive disorder, single episode, unspecified: Secondary | ICD-10-CM

## 2013-09-14 DIAGNOSIS — F3289 Other specified depressive episodes: Secondary | ICD-10-CM

## 2013-09-14 MED ORDER — FLUOXETINE HCL 20 MG PO CAPS: ORAL_CAPSULE | ORAL | Status: DC

## 2013-09-14 MED ORDER — TRAZODONE HCL 100 MG PO TABS
100.0000 mg | ORAL_TABLET | Freq: Every day | ORAL | Status: DC
Start: 2013-09-14 — End: 2013-09-21

## 2013-09-14 MED ORDER — ALPRAZOLAM 1 MG PO TABS: 1.0000 mg | ORAL_TABLET | Freq: Four times a day (QID) | ORAL | Status: DC
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
## 2013-09-16 ENCOUNTER — Ambulatory Visit: Payer: Medicare Other | Admitting: Family Medicine

## 2013-09-20 ENCOUNTER — Encounter: Payer: Self-pay | Admitting: Family Medicine

## 2013-09-20 ENCOUNTER — Ambulatory Visit (INDEPENDENT_AMBULATORY_CARE_PROVIDER_SITE_OTHER): Payer: Medicare Other | Admitting: Family Medicine

## 2013-09-20 VITALS — BP 134/70 | HR 64 | Temp 97.7°F | Resp 12 | Ht 62.0 in | Wt 187.0 lb

## 2013-09-20 DIAGNOSIS — IMO0002 Reserved for concepts with insufficient information to code with codable children: Secondary | ICD-10-CM | POA: Diagnosis not present

## 2013-09-20 DIAGNOSIS — Z932 Ileostomy status: Secondary | ICD-10-CM

## 2013-09-20 DIAGNOSIS — M1710 Unilateral primary osteoarthritis, unspecified knee: Secondary | ICD-10-CM | POA: Diagnosis not present

## 2013-09-20 DIAGNOSIS — M179 Osteoarthritis of knee, unspecified: Secondary | ICD-10-CM | POA: Insufficient documentation

## 2013-09-20 DIAGNOSIS — Q6410 Exstrophy of urinary bladder, unspecified: Secondary | ICD-10-CM | POA: Diagnosis not present

## 2013-09-20 MED ORDER — HYDROCODONE-ACETAMINOPHEN 10-325 MG PO TABS: 1.0000 | ORAL_TABLET | ORAL | Status: DC | PRN

## 2013-09-20 NOTE — Assessment & Plan Note (Signed)
Ortho referral to be done Continue pain meds

## 2013-09-20 NOTE — Assessment & Plan Note (Signed)
Lifetime urostomy has loop conduit for bladder

## 2013-09-20 NOTE — Assessment & Plan Note (Signed)
Script for supplies given, needs lifetime

## 2013-09-20 NOTE — Patient Instructions (Signed)
F/U as previous Continue current medication Referral to orthopedics for knee

## 2013-09-20 NOTE — Progress Notes (Signed)
Patient ID: Kathleen Palmer, female   DOB: 09-08-1960, 53 y.o.   MRN: [id number]   Subjective:    Patient ID: Kathleen Palmer, female    DOB: [birth year], 53 y.o.   MRN: [id number]  Patient presents for F/U, Urostomy supplies and other R knee arthritis pain  Patient here for interim visit. She continues to have pain with her right knee x-ray shows severe osteoarthritis as well as a fusion and try to set her up with orthopedics however she declined at the time. She would like to go ahead and return to orthopedics now. She also asked about a knee brace she's on TV however and I do not order these braces   Urostomy supplies new prescription needed for her supplies.  Depression/PTSD she is being followed by psychiatry she was started on what to do recently secondary to concern for possible bipolar one disorder however she states that the medication is been making her more emotional than typically. She will followup with her psychiatrist this afternoon. She denies any suicidal ideations.   Review Of Systems:  GEN- denies fatigue, fever, weight loss,weakness, recent illness HEENT- denies eye drainage, change in vision, nasal discharge, CVS- denies chest pain, palpitations RESP- denies SOB, cough, wheeze ABD- denies N/V, change in stools, abd pain MSK- + joint pain, muscle aches, injury Neuro- denies headache, dizziness, syncope, seizure activity       Objective:    BP 134/70  Pulse 64  Temp(Src) 97.7 F (36.5 C) (Oral)  Resp 12  Ht 5\' 2"  (1.575 m)  Wt 187 lb (84.823 kg)  BMI 34.19 kg/m2 GEN- NAD, alert and oriented x3 MSK- Bilateral knees normal inspection, Right knee- no effusion, fair ROM,  EXT- No edema Psych- depressed affect, not anxious appearing, no SI, good eye contact       Assessment & Plan:      Problem List Items Addressed This Visit   None      Note: This dictation was prepared with Dragon dictation along with smaller phrase technology. Any transcriptional errors  that result from this process are unintentional.

## 2013-09-21 ENCOUNTER — Other Ambulatory Visit (HOSPITAL_COMMUNITY): Payer: Self-pay | Admitting: Psychiatry

## 2013-09-21 ENCOUNTER — Telehealth (HOSPITAL_COMMUNITY): Payer: Self-pay | Admitting: *Deleted

## 2013-09-21 DIAGNOSIS — F5105 Insomnia due to other mental disorder: Secondary | ICD-10-CM

## 2013-09-21 MED ORDER — FLUOXETINE HCL 20 MG PO CAPS
60.0000 mg | ORAL_CAPSULE | Freq: Every day | ORAL | Status: DC
Start: 2013-09-21 — End: 2013-12-15

## 2013-09-21 MED ORDER — TRAZODONE HCL 100 MG PO TABS: 100.0000 mg | ORAL_TABLET | Freq: Every day | ORAL | Status: DC

## 2013-09-21 NOTE — Telephone Encounter (Signed)
done

## 2013-10-03 ENCOUNTER — Ambulatory Visit (HOSPITAL_COMMUNITY): Payer: Self-pay | Admitting: Psychiatry

## 2013-10-17 ENCOUNTER — Telehealth (HOSPITAL_COMMUNITY): Payer: Self-pay | Admitting: *Deleted

## 2013-10-17 ENCOUNTER — Ambulatory Visit (HOSPITAL_COMMUNITY): Payer: Self-pay | Admitting: Psychiatry

## 2013-10-21 ENCOUNTER — Telehealth: Payer: Self-pay | Admitting: Family Medicine

## 2013-10-21 ENCOUNTER — Ambulatory Visit (INDEPENDENT_AMBULATORY_CARE_PROVIDER_SITE_OTHER): Payer: Medicare Other | Admitting: Psychiatry

## 2013-10-21 ENCOUNTER — Encounter (HOSPITAL_COMMUNITY): Payer: Self-pay | Admitting: Psychiatry

## 2013-10-21 VITALS — BP 130/80 | Ht 62.0 in | Wt 182.0 lb

## 2013-10-21 DIAGNOSIS — F3289 Other specified depressive episodes: Secondary | ICD-10-CM

## 2013-10-21 DIAGNOSIS — F329 Major depressive disorder, single episode, unspecified: Secondary | ICD-10-CM

## 2013-10-21 DIAGNOSIS — F1210 Cannabis abuse, uncomplicated: Secondary | ICD-10-CM

## 2013-10-21 DIAGNOSIS — F39 Unspecified mood [affective] disorder: Secondary | ICD-10-CM

## 2013-10-21 MED ORDER — HYDROCODONE-ACETAMINOPHEN 10-325 MG PO TABS: 1.0000 | ORAL_TABLET | ORAL | Status: DC | PRN

## 2013-10-21 MED ORDER — ALPRAZOLAM 1 MG PO TABS
1.0000 mg | ORAL_TABLET | Freq: Four times a day (QID) | ORAL | Status: DC
Start: 2013-10-21 — End: 2013-12-15

## 2013-10-21 NOTE — Telephone Encounter (Signed)
Request has been put in to MD.   Will call patient as soon as approved/ denied.
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
## 2013-10-21 NOTE — Telephone Encounter (Signed)
Ok to refill??  Last office visit/ refill 09/20/2013.

## 2013-10-21 NOTE — Telephone Encounter (Signed)
okay

## 2013-10-21 NOTE — Telephone Encounter (Signed)
Prescription printed and patient made aware to come to office to pick up.  

## 2013-10-21 NOTE — Telephone Encounter (Signed)
Patient is calling to get rx for pain medication  [phone]

## 2013-10-21 NOTE — Telephone Encounter (Signed)
[phone] °Pt is needing a refill on HYDROcodone-acetaminophen (NORCO) 10-325 MG per tablet °

## 2013-11-18 ENCOUNTER — Telehealth: Payer: Self-pay | Admitting: Family Medicine

## 2013-11-18 MED ORDER — HYDROCODONE-ACETAMINOPHEN 10-325 MG PO TABS: 1.0000 | ORAL_TABLET | ORAL | Status: DC | PRN

## 2013-11-18 NOTE — Telephone Encounter (Signed)
Patient is calling for rx for her hydrocodone  Call when ready [phone]

## 2013-11-18 NOTE — Telephone Encounter (Signed)
okay

## 2013-11-18 NOTE — Telephone Encounter (Signed)
Prescription printed and patient made aware to come to office to pick up.  

## 2013-11-18 NOTE — Telephone Encounter (Signed)
Ok to refill??  Last office visit 09/20/2013.  Last refill 10/21/2013.

## 2013-12-01 ENCOUNTER — Ambulatory Visit (HOSPITAL_COMMUNITY): Payer: Self-pay | Admitting: Psychiatry

## 2013-12-02 ENCOUNTER — Ambulatory Visit (INDEPENDENT_AMBULATORY_CARE_PROVIDER_SITE_OTHER): Payer: Medicare Other | Admitting: Family Medicine

## 2013-12-02 ENCOUNTER — Other Ambulatory Visit: Payer: Self-pay | Admitting: Family Medicine

## 2013-12-02 ENCOUNTER — Telehealth: Payer: Self-pay | Admitting: Family Medicine

## 2013-12-02 ENCOUNTER — Encounter: Payer: Self-pay | Admitting: Family Medicine

## 2013-12-02 VITALS — BP 128/76 | HR 64 | Temp 98.3°F | Resp 12 | Ht 62.0 in | Wt 185.0 lb

## 2013-12-02 DIAGNOSIS — M8580 Other specified disorders of bone density and structure, unspecified site: Secondary | ICD-10-CM | POA: Insufficient documentation

## 2013-12-02 DIAGNOSIS — B182 Chronic viral hepatitis C: Secondary | ICD-10-CM

## 2013-12-02 DIAGNOSIS — Q6410 Exstrophy of urinary bladder, unspecified: Secondary | ICD-10-CM

## 2013-12-02 DIAGNOSIS — M949 Disorder of cartilage, unspecified: Secondary | ICD-10-CM | POA: Diagnosis not present

## 2013-12-02 DIAGNOSIS — M899 Disorder of bone, unspecified: Secondary | ICD-10-CM | POA: Diagnosis not present

## 2013-12-02 DIAGNOSIS — E039 Hypothyroidism, unspecified: Secondary | ICD-10-CM | POA: Diagnosis not present

## 2013-12-02 DIAGNOSIS — Z79899 Other long term (current) drug therapy: Secondary | ICD-10-CM

## 2013-12-02 DIAGNOSIS — Z932 Ileostomy status: Secondary | ICD-10-CM

## 2013-12-02 DIAGNOSIS — R1011 Right upper quadrant pain: Secondary | ICD-10-CM

## 2013-12-02 LAB — CBC WITH DIFFERENTIAL/PLATELET
BASOS ABS: 0 10*3/uL (ref 0.0–0.1)
BASOS PCT: 0 % (ref 0–1)
EOS ABS: 0.1 10*3/uL (ref 0.0–0.7)
EOS PCT: 1 % (ref 0–5)
HCT: 40.9 % (ref 36.0–46.0)
Hemoglobin: 14.5 g/dL (ref 12.0–15.0)
Lymphocytes Relative: 22 % (ref 12–46)
Lymphs Abs: 1.7 10*3/uL (ref 0.7–4.0)
MCH: 30.6 pg (ref 26.0–34.0)
MCHC: 35.5 g/dL (ref 30.0–36.0)
MCV: 86.3 fL (ref 78.0–100.0)
Monocytes Absolute: 0.5 10*3/uL (ref 0.1–1.0)
Monocytes Relative: 7 % (ref 3–12)
NEUTROS PCT: 70 % (ref 43–77)
Neutro Abs: 5.4 10*3/uL (ref 1.7–7.7)
PLATELETS: 208 10*3/uL (ref 150–400)
RBC: 4.74 MIL/uL (ref 3.87–5.11)
RDW: 14.1 % (ref 11.5–15.5)
WBC: 7.7 10*3/uL (ref 4.0–10.5)

## 2013-12-02 NOTE — Assessment & Plan Note (Signed)
Will start her on vitamin D this she was a bit leery of this

## 2013-12-02 NOTE — Assessment & Plan Note (Signed)
New bags have been placed

## 2013-12-02 NOTE — Assessment & Plan Note (Signed)
Recheck thyroid levels she was never on medication the past because her mother told her that was not a good medicine to take

## 2013-12-02 NOTE — Assessment & Plan Note (Signed)
I will obtain right upper quadrant ultrasound she will have hepatitis C RNA levels and genotype done again she'll be referred to GI for further management

## 2013-12-02 NOTE — Telephone Encounter (Signed)
Prescription for Night Drainage System for Urostomy sent to [facility].

## 2013-12-02 NOTE — Progress Notes (Signed)
Patient ID: Kathleen Palmer, female   DOB: [birth year], 53 y.o.   MRN: [id number]   Subjective:    Patient ID: Kathleen Palmer, female    DOB: 1960-11-01, 53 y.o.   MRN: [id number]  Patient presents for 4 month F/U and Discuss other issues  patient here to follow chronic medical problems. She would now like to proceed with treatment for hepatitis C she states that she was having some liver pain the other day and she now understands that it is important. She also saw commercial to see if she could be on a different medication besides interferon.  Urostomy she has a urostomy supplies however needs an overnight bag and she tends to leak at nighttime. The new back does not cause any irritation to the skin.  Chronic pain she continues to require chronic pain medications there've been no changes.  PTSD/depression/anxiety she still followed by her psychiatrist she is taking her medications as prescribed per report.    Review Of Systems:  GEN- denies fatigue, fever, weight loss,weakness, recent illness HEENT- denies eye drainage, change in vision, nasal discharge, CVS- denies chest pain, palpitations RESP- denies SOB, cough, wheeze ABD- denies N/V, change in stools,+ abd pain GU- denies dysuria, hematuria, dribbling, incontinence MSK- + joint pain, muscle aches, injury Neuro- denies headache, dizziness, syncope, seizure activity       Objective:    BP 128/76  Pulse 64  Temp(Src) 98.3 F (36.8 C) (Oral)  Resp 12  Ht 5\' 2"  (1.575 m)  Wt 185 lb (83.915 kg)  BMI 33.83 kg/m2 GEN- NAD, alert and oriented x3 HEENT- PERRL, EOMI, non injected sclera, pink conjunctiva, MMM, oropharynx clear CVS- RRR, no murmur RESP-CTAB ABD-- urethral stump/ilieoconduit osteomy- pink and moist, NABS,soft,NT, ventral hernia, no HSM EXT- No edema        Assessment & Plan:      Problem List Items Addressed This Visit   Osteopenia   Ileostomy status   Hypothyroidism   Hepatitis C - Primary   Relevant  Orders      CBC with Differential      Comprehensive metabolic panel      HCV RNA quant rflx ultra or genotyp   Bladder extrophy    Other Visit Diagnoses   Long-term use of high-risk medication        Relevant Orders       Prescript Monitor Profile(13)       Note: This dictation was prepared with Dragon dictation along with smaller phrase technology. Any transcriptional errors that result from this process are unintentional.

## 2013-12-02 NOTE — Patient Instructions (Addendum)
Start Vitamin 1000IU once a day for bone strength  Mammogram to be done Stool cards  Ultrsaound of liver to be done We will call with lab results and appointment for liver specialist F/U 3 months

## 2013-12-02 NOTE — Telephone Encounter (Signed)
Pt is calling because she isnt sure if her medications were going to be called into [facility] I didn't see if anything had been so i told her i would send you a message Call back number is [phone]

## 2013-12-03 LAB — COMPREHENSIVE METABOLIC PANEL
ALT: 31 U/L (ref 0–35)
AST: 28 U/L (ref 0–37)
Albumin: 4.4 g/dL (ref 3.5–5.2)
Alkaline Phosphatase: 52 U/L (ref 39–117)
BUN: 16 mg/dL (ref 6–23)
CO2: 27 mEq/L (ref 19–32)
Calcium: 9.6 mg/dL (ref 8.4–10.5)
Chloride: 104 mEq/L (ref 96–112)
Creat: 0.8 mg/dL (ref 0.50–1.10)
Glucose, Bld: 91 mg/dL (ref 70–99)
Potassium: 4.1 mEq/L (ref 3.5–5.3)
SODIUM: 139 meq/L (ref 135–145)
TOTAL PROTEIN: 7 g/dL (ref 6.0–8.3)
Total Bilirubin: 0.4 mg/dL (ref 0.2–1.2)

## 2013-12-05 LAB — TSH: TSH: 5.059 u[IU]/mL — ABNORMAL HIGH (ref 0.350–4.500)

## 2013-12-06 LAB — HCV RNA QUANT RFLX ULTRA OR GENOTYP
HCV Quantitative Log: 7.25 {Log} — ABNORMAL HIGH (ref <1.18)
HCV Quantitative: 17967503 IU/mL — ABNORMAL HIGH (ref <15)

## 2013-12-07 ENCOUNTER — Other Ambulatory Visit: Payer: Self-pay | Admitting: *Deleted

## 2013-12-07 ENCOUNTER — Ambulatory Visit (HOSPITAL_COMMUNITY)
Admission: RE | Admit: 2013-12-07 | Discharge: 2013-12-07 | Disposition: A | Discharge status: Home / Self Care | Payer: Medicare Other | Source: Ambulatory Visit | Attending: Family Medicine | Admitting: Family Medicine

## 2013-12-07 DIAGNOSIS — B182 Chronic viral hepatitis C: Secondary | ICD-10-CM

## 2013-12-07 DIAGNOSIS — R1011 Right upper quadrant pain: Secondary | ICD-10-CM

## 2013-12-07 DIAGNOSIS — Z1231 Encounter for screening mammogram for malignant neoplasm of breast: Secondary | ICD-10-CM | POA: Diagnosis not present

## 2013-12-07 DIAGNOSIS — B1920 Unspecified viral hepatitis C without hepatic coma: Secondary | ICD-10-CM

## 2013-12-07 LAB — HEPATITIS C GENOTYPE

## 2013-12-09 ENCOUNTER — Other Ambulatory Visit (HOSPITAL_COMMUNITY): Payer: Medicare Other

## 2013-12-09 LAB — BENZODIAZEPINES (GC/LC/MS), URINE
ALPRAZOLAMU: 970 ng/mL — AB (ref <25)
Clonazepam metabolite (GC/LC/MS), ur confirm: NEGATIVE ng/mL (ref <25)
Flurazepam metabolite (GC/LC/MS), ur confirm: NEGATIVE ng/mL (ref <50)
LORAZEPAMU: NEGATIVE ng/mL (ref <50)
Midazolam (GC/LC/MS), ur confirm: NEGATIVE ng/mL (ref <50)
Nordiazepam (GC/LC/MS), ur confirm: 285 ng/mL — AB (ref <50)
OXAZEPAMU: 758 ng/mL — AB (ref <50)
TEMAZEPAMU: 1056 ng/mL — AB (ref <50)
Triazolam metabolite (GC/LC/MS), ur confirm: NEGATIVE ng/mL (ref <50)

## 2013-12-09 LAB — OPIATES/OPIOIDS (LC/MS-MS)
Codeine Urine: NEGATIVE ng/mL (ref <50)
HYDROMORPHONE: NEGATIVE ng/mL (ref <50)
Hydrocodone: 513 ng/mL — AB (ref <50)
Morphine Urine: NEGATIVE ng/mL (ref <50)
Norhydrocodone, Ur: 629 ng/mL — AB (ref <50)
Noroxycodone, Ur: NEGATIVE ng/mL (ref <50)
OXYMORPHONE, URINE: NEGATIVE ng/mL (ref <50)
Oxycodone, ur: NEGATIVE ng/mL (ref <50)

## 2013-12-09 LAB — AMPHETAMINES (GC/LC/MS), URINE
Amphetamine GC/MS Conf: NEGATIVE ng/mL (ref <250)
METHAMPHETAMINE QUANT UR: NEGATIVE ng/mL (ref <250)

## 2013-12-09 LAB — CANNABANOIDS (GC/LC/MS), URINE: THC-COOH (GC/LC/MS), ur confirm: 256 ng/mL — AB (ref <5)

## 2013-12-10 LAB — PRESCRIPTION MONITORING PROFILE (13 PANEL)
BUPRENORPHINE, URINE: NEGATIVE ng/mL
Barbiturate Screen, Urine: NEGATIVE ng/mL
CREATININE, URINE: 130.23 mg/dL (ref >20.0)
Cocaine Metabolites: NEGATIVE ng/mL
FENTANYL URINE: NEGATIVE ng/mL
Meperidine, Ur: NEGATIVE ng/mL
Methadone Screen, Urine: NEGATIVE ng/mL
Nitrites, Initial: NEGATIVE ug/mL
Oxycodone Screen, Ur: NEGATIVE ng/mL
PROPOXYPHENE: NEGATIVE ng/mL
TRAMADOL UR: NEGATIVE ng/mL
pH, Initial: 5.8 pH (ref 4.5–8.9)

## 2013-12-13 ENCOUNTER — Ambulatory Visit (HOSPITAL_COMMUNITY)
Admission: RE | Admit: 2013-12-13 | Discharge: 2013-12-13 | Disposition: A | Discharge status: Home / Self Care | Payer: Medicare Other | Source: Ambulatory Visit | Attending: Family Medicine | Admitting: Family Medicine

## 2013-12-13 DIAGNOSIS — Z9089 Acquired absence of other organs: Secondary | ICD-10-CM | POA: Insufficient documentation

## 2013-12-13 DIAGNOSIS — R160 Hepatomegaly, not elsewhere classified: Secondary | ICD-10-CM | POA: Diagnosis not present

## 2013-12-13 DIAGNOSIS — R1011 Right upper quadrant pain: Secondary | ICD-10-CM | POA: Diagnosis not present

## 2013-12-15 ENCOUNTER — Other Ambulatory Visit: Payer: Self-pay | Admitting: *Deleted

## 2013-12-15 ENCOUNTER — Other Ambulatory Visit: Payer: Self-pay | Admitting: Family Medicine

## 2013-12-15 ENCOUNTER — Encounter (HOSPITAL_COMMUNITY): Payer: Self-pay | Admitting: Psychiatry

## 2013-12-15 ENCOUNTER — Ambulatory Visit (INDEPENDENT_AMBULATORY_CARE_PROVIDER_SITE_OTHER): Payer: Medicare Other | Admitting: Psychiatry

## 2013-12-15 VITALS — BP 108/80 | HR 78 | Ht 62.0 in | Wt 180.8 lb

## 2013-12-15 DIAGNOSIS — F319 Bipolar disorder, unspecified: Secondary | ICD-10-CM

## 2013-12-15 DIAGNOSIS — F1210 Cannabis abuse, uncomplicated: Secondary | ICD-10-CM

## 2013-12-15 DIAGNOSIS — E039 Hypothyroidism, unspecified: Secondary | ICD-10-CM | POA: Diagnosis not present

## 2013-12-15 DIAGNOSIS — F5105 Insomnia due to other mental disorder: Secondary | ICD-10-CM

## 2013-12-15 DIAGNOSIS — F329 Major depressive disorder, single episode, unspecified: Secondary | ICD-10-CM

## 2013-12-15 DIAGNOSIS — F3289 Other specified depressive episodes: Secondary | ICD-10-CM

## 2013-12-15 DIAGNOSIS — F4310 Post-traumatic stress disorder, unspecified: Secondary | ICD-10-CM

## 2013-12-15 LAB — T4, FREE: Free T4: 0.97 ng/dL (ref 0.80–1.80)

## 2013-12-15 LAB — T3, FREE: T3, Free: 2.8 pg/mL (ref 2.3–4.2)

## 2013-12-15 MED ORDER — FLUOXETINE HCL 20 MG PO CAPS: ORAL_CAPSULE | ORAL | Status: DC

## 2013-12-15 MED ORDER — TRAZODONE HCL 100 MG PO TABS: 100.0000 mg | ORAL_TABLET | Freq: Every day | ORAL | Status: DC

## 2013-12-15 MED ORDER — ALPRAZOLAM 1 MG PO TABS: 1.0000 mg | ORAL_TABLET | Freq: Four times a day (QID) | ORAL | Status: DC
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
## 2013-12-16 ENCOUNTER — Telehealth: Payer: Self-pay | Admitting: Family Medicine

## 2013-12-16 MED ORDER — LEVOTHYROXINE SODIUM 25 MCG PO TABS: 25.0000 ug | ORAL_TABLET | Freq: Every day | ORAL | Status: DC

## 2013-12-16 NOTE — Telephone Encounter (Signed)
Call placed to patient.   Advised that TSH was slightly high and that was why the other labs were obtained.   Advised that the additional labs showed that she was not making enough thyroid hormone.   Prescription sent to pharmacy.

## 2013-12-16 NOTE — Telephone Encounter (Signed)
[phone]  Pt is calling because she had blood work drawn on 8/7 and someone called her and told her it was good then someone called her back and told her she needed to come in and do more blood work the pt is confused of what is going on.

## 2013-12-19 ENCOUNTER — Telehealth: Payer: Self-pay | Admitting: *Deleted

## 2013-12-19 NOTE — Telephone Encounter (Signed)
Received call from patient requesting refill on Hydrocodone.   Ok to refill??  Last office visit 12/02/2013.  Last refill 11/18/2013.

## 2013-12-20 ENCOUNTER — Telehealth: Payer: Self-pay | Admitting: Family Medicine

## 2013-12-20 MED ORDER — HYDROCODONE-ACETAMINOPHEN 10-325 MG PO TABS: 1.0000 | ORAL_TABLET | ORAL | Status: DC | PRN

## 2013-12-20 NOTE — Telephone Encounter (Signed)
Prescription printed per previous message.

## 2013-12-20 NOTE — Telephone Encounter (Signed)
Prescription printed and patient made aware to come to office to pick up.  

## 2013-12-20 NOTE — Telephone Encounter (Signed)
ok 

## 2013-12-20 NOTE — Telephone Encounter (Signed)
Patient is calling to get rx for her pain medication [phone] when ready

## 2013-12-21 ENCOUNTER — Other Ambulatory Visit: Payer: Self-pay | Admitting: Family Medicine

## 2013-12-21 DIAGNOSIS — R928 Other abnormal and inconclusive findings on diagnostic imaging of breast: Secondary | ICD-10-CM

## 2013-12-22 ENCOUNTER — Encounter: Payer: Self-pay | Admitting: Internal Medicine

## 2014-01-17 ENCOUNTER — Other Ambulatory Visit: Payer: Self-pay | Admitting: Family Medicine

## 2014-01-17 ENCOUNTER — Ambulatory Visit (HOSPITAL_COMMUNITY)
Admission: RE | Admit: 2014-01-17 | Discharge: 2014-01-17 | Disposition: A | Discharge status: Home / Self Care | Payer: Medicare Other | Source: Ambulatory Visit | Attending: Family Medicine | Admitting: Family Medicine

## 2014-01-17 DIAGNOSIS — R928 Other abnormal and inconclusive findings on diagnostic imaging of breast: Secondary | ICD-10-CM | POA: Diagnosis not present

## 2014-01-17 DIAGNOSIS — R229 Localized swelling, mass and lump, unspecified: Principal | ICD-10-CM

## 2014-01-17 DIAGNOSIS — N63 Unspecified lump in unspecified breast: Secondary | ICD-10-CM | POA: Diagnosis not present

## 2014-01-17 DIAGNOSIS — IMO0002 Reserved for concepts with insufficient information to code with codable children: Secondary | ICD-10-CM

## 2014-01-18 ENCOUNTER — Telehealth: Payer: Self-pay | Admitting: *Deleted

## 2014-01-18 MED ORDER — HYDROCODONE-ACETAMINOPHEN 10-325 MG PO TABS: 1.0000 | ORAL_TABLET | ORAL | Status: DC | PRN

## 2014-01-18 NOTE — Telephone Encounter (Signed)
Medication refilled and pt aware of pickup

## 2014-01-18 NOTE — Telephone Encounter (Signed)
okay

## 2014-01-18 NOTE — Telephone Encounter (Signed)
Refill HYDROcodone-acetaminophen (NORCO) 10-325 MG per tablet last refill 12/20/13

## 2014-01-24 ENCOUNTER — Ambulatory Visit (HOSPITAL_COMMUNITY): Admission: RE | Admit: 2014-01-24 | Payer: Medicare Other | Source: Ambulatory Visit

## 2014-01-24 ENCOUNTER — Ambulatory Visit (INDEPENDENT_AMBULATORY_CARE_PROVIDER_SITE_OTHER): Payer: Medicare Other | Admitting: Gastroenterology

## 2014-01-24 ENCOUNTER — Encounter: Payer: Self-pay | Admitting: Gastroenterology

## 2014-01-24 VITALS — BP 129/73 | HR 64 | Temp 98.2°F | Ht 65.0 in | Wt 186.0 lb

## 2014-01-24 DIAGNOSIS — B182 Chronic viral hepatitis C: Secondary | ICD-10-CM | POA: Insufficient documentation

## 2014-01-24 NOTE — Progress Notes (Signed)
cc'ed to pcp °

## 2014-01-24 NOTE — Patient Instructions (Signed)
1. Please read over the [facility] patient information guide. We will start the process of getting you approved for the medication. This will take several weeks.

## 2014-01-24 NOTE — Assessment & Plan Note (Signed)
Chronic HCV, genotype 1a, treatment naive. U/S with hepatomegaly/fatty infiltration but no obvious cirrhosis. Discussed Harvoni at length with patient. Initially we will begin paper work process for approval but we will await work up of breast mass before beginning therapy. Discussed need of compliance with medication, report additional meds RX or OTC to [place] prior to starting due to risk of drug to drug interaction. Discussed natural history of chronic HCV, modes of transmission. We will be in touch with patient over the next few weeks regarding medication approval.

## 2014-01-24 NOTE — Progress Notes (Signed)
Primary Care Physician:  Vic Blackbird, MD  Primary Gastroenterologist:  Barney Drain, MD   Chief Complaint  Patient presents with  . Hepatitis C    HPI:  Kathleen Palmer is a 53 y.o. female here for consideration of HCV treatment. Diagnosed in 1996. Treatment naive. She has h/o anxiety/depression managed by psychiatry. Reports good control for past 9 months or so. She has had recent abd u/s with hepatomegaly and fatty liver. Recent LFTs are normal. Her HCV RNA from 11/2013 was 48,546,270. Genotype 1a.   She complains of daily severe fatigue. No abdominal pain. Appetite okay. BM regular. No melena, brbpr. No heartburn on regular basis, TUMS only on occasional. No N/V. No jaundiced. No prior colonoscopy or EGD. She refuses colonoscopy given her history of multiple pelvic surgeries/complications/ileal loop conduit. H/O remote IV drug use/cocaine, none since the 1980s. Multiple drug screens negative for cocaine. No etoh use since 2004.   Currently undergoing evaluation of breast mass thought to be benign but u/s guided biopsy planned.  Current Outpatient Prescriptions  Medication Sig Dispense Refill  . ALPRAZolam (XANAX) 1 MG tablet Take 1 tablet (1 mg total) by mouth 4 (four) times daily.  120 tablet  2  . ESTRACE VAGINAL 0.1 MG/GM vaginal cream       . FLUoxetine (PROZAC) 20 MG capsule Take 80 mg by mouth daily. Take two twice a day      . HYDROcodone-acetaminophen (NORCO) 10-325 MG per tablet Take 1 tablet by mouth every 5 (five) hours as needed.  150 tablet  0  . traZODone (DESYREL) 100 MG tablet Take 50-100 mg by mouth at bedtime.       No current facility-administered medications for this visit.    Allergies as of 01/24/2014 - Review Complete 01/24/2014  Allergen Reaction Noted  . Oxycodone Anxiety 03/05/2012  . Abilify [aripiprazole]  06/08/2013  . Ampicillin  12/01/2011  . Bactrim [sulfamethoxazole-trimethoprim]  12/01/2011  . Gabapentin  12/01/2011    Past Medical History   Diagnosis Date  . 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 Anxiety   . Depression   . PTSD (post-traumatic stress disorder)   . Substance abuse     Remote history- cocaine, ETOH, Marijuana  . Hepatitis C     ? Contracted through IVDA  . Thyroid disease   . Chronic pain   . Arthritis   . Angiomyolipoma     Left kidney  . Panic 12/12/1967  . Panic 04/29/1999    Past Surgical History  Procedure Laterality Date  . Abdominal surgery    . Abdominal hysterectomy    . Cholecystectomy    . Vagina reconstruction surgery    . Hernia repair    . Ileo loop conduit    . 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 Adopted: Yes  . Depression Mother   . Depression Sister   . Anxiety disorder Sister   . Bipolar disorder Sister   . Depression Maternal Grandmother   . ADD / ADHD Neg Hx   . Alcohol abuse Neg Hx   . Drug abuse Neg Hx   . Dementia Neg Hx   . OCD Neg Hx   . Paranoid behavior Neg Hx   . Schizophrenia Neg Hx   . Seizures Neg Hx   . Sexual abuse  Neg Hx   . Physical abuse Neg Hx   . Suicidality Neg Hx   . Colon cancer Neg Hx     History   Social History  . Marital Status: Married    Spouse Name: N/A    Number of Children: 0  . Years of Education: N/A   Occupational History  . disability    Social History Main Topics  . Smoking status: Current Some Day Smoker -- 0.50 packs/day for 33 years    Types: Cigarettes  . Smokeless tobacco: Never Used     Comment: 9-10 cigs a day as of 10/20/2012  . Alcohol Use: No     Comment: No etoh since 2004  . Drug Use: Yes    Special: Marijuana     Comment: cocaine, back in the 1980s  . Sexual Activity: No   Other Topics Concern  . Not on file   Social History Narrative  . No narrative on file      ROS:  General: Negative for anorexia, weight loss, fever, chills,  fatigue, weakness. Eyes: Negative for vision changes.  ENT: Negative for hoarseness, difficulty swallowing , nasal congestion. CV: Negative for chest pain, angina, palpitations, dyspnea on exertion, peripheral edema.  Respiratory: Negative for dyspnea at rest, dyspnea on exertion, cough, sputum, wheezing.  GI: See history of present illness. GU:  Negative for dysuria, hematuria, urinary incontinence, urinary frequency, nocturnal urination.  MS: Negative for joint pain, low back pain.  Derm: Negative for rash or itching.  Neuro: Negative for weakness, abnormal sensation, seizure, frequent headaches, memory loss, confusion.  Psych: Positive for anxiety, depression, No suicidal ideation, hallucinations.  Endo: Negative for unusual weight change.  Heme: Negative for bruising or bleeding. Allergy: Negative for rash or hives.    Physical Examination:  BP 129/73  Pulse 64  Temp(Src) 98.2 F (36.8 C)  Ht 5\' 5"  (1.651 m)  Wt 186 lb (84.369 kg)  BMI 30.95 kg/m2   General: Well-nourished, well-developed in no acute distress.  Head: Normocephalic, atraumatic.   Eyes: Conjunctiva pink, no icterus. Mouth: Oropharyngeal mucosa moist and pink , no lesions erythema or exudate. Neck: Supple without thyromegaly, masses, or lymphadenopathy.  Lungs: Clear to auscultation bilaterally.  Heart: Regular rate and rhythm, no murmurs rubs or gallops.  Abdomen: Bowel sounds are normal, nontender, nondistended, no hepatosplenomegaly or masses, no abdominal bruits or    hernia , no rebound or guarding.   Rectal: not performed Extremities: No lower extremity edema. No clubbing or deformities.  Neuro: Alert and oriented x 4 , grossly normal neurologically.  Skin: Warm and dry, no rash or jaundice.   Psych: Alert and cooperative, normal mood and affect.  Labs: Lab Results  Component Value Date   WBC 7.7 12/02/2013   HGB 14.5 12/02/2013   HCT 40.9 12/02/2013   MCV 86.3 12/02/2013   PLT 208 12/02/2013   Lab  Results  Component Value Date   ALT 31 12/02/2013   AST 28 12/02/2013   ALKPHOS 52 12/02/2013   BILITOT 0.4 12/02/2013   Lab Results  Component Value Date   CREATININE 0.80 12/02/2013   BUN 16 12/02/2013   NA 139 12/02/2013   K 4.1 12/02/2013   CL 104 12/02/2013   CO2 27 12/02/2013     Imaging Studies: Mm Digital Diagnostic Unilat L  01/17/2014   CLINICAL DATA:  Screening recall for possible left breast masses.  EXAM: DIGITAL DIAGNOSTIC  LEFT MAMMOGRAM  ULTRASOUND LEFT BREAST  COMPARISON:  None.  ACR Breast Density Category c: The breast tissue is heterogeneously dense, which may obscure small masses.  FINDINGS: Spot compression CC and MLO views were performed over the central and upper outer left breast. There is an oval mass with indistinct margins in the left breast at the approximate 12 o'clock position anterior depth measuring approximately 78 mm. The initially questioned possible mass in the upper outer left breast is not definitely apparent on the additional views.  Physical examination the slightly upper left breast and upper outer left breast does not reveal any palpable masses.  Targeted ultrasound of the left breast was performed demonstrating an irregular hypoechoic mass at 12 o'clock 3 cm from the nipple measuring 0.7 x 0.5 x 0.9 cm. This corresponds with mammography findings. This may represent a cluster of cysts, however cannot be clearly characterized as such. No abnormalities were seen in the upper-outer left breast. No lymphadenopathy seen in the left axilla.  IMPRESSION: 1. Indeterminate mass in the left breast at 12 o'clock, which could represent a cluster of cysts, however cannot be clearly characterized as such.  2. Initially questioned possible mass in the upper-outer left breast resolves on the additional imaging with no correlate seen on targeted ultrasound, with findings compatible with overlapping breast tissue.  RECOMMENDATION: Ultrasound-guided biopsy of the mass in the left breast at 12  o'clock is recommended. This was offered to be performed today, however the patient requests for this to be scheduled next week.  I have discussed the findings and recommendations with the patient. Results were also provided in writing at the conclusion of the visit. If applicable, a reminder letter will be sent to the patient regarding the next appointment.  BI-RADS CATEGORY  4: Suspicious.   Electronically Signed   By: Everlean Alstrom M.D.   On: 01/17/2014 11:11   US Breast Ltd Uni Left Inc Axilla  01/19/2014   CLINICAL DATA:  Screening recall for possible left breast masses.  EXAM: DIGITAL DIAGNOSTIC  LEFT MAMMOGRAM  ULTRASOUND LEFT BREAST  COMPARISON:  None.  ACR Breast Density Category c: The breast tissue is heterogeneously dense, which may obscure small masses.  FINDINGS: Spot compression CC and MLO views were performed over the central and upper outer left breast. There is an oval mass with indistinct margins in the left breast at the approximate 12 o'clock position anterior depth measuring approximately 78 mm. The initially questioned possible mass in the upper outer left breast is not definitely apparent on the additional views.  Physical examination the slightly upper left breast and upper outer left breast does not reveal any palpable masses.  Targeted ultrasound of the left breast was performed demonstrating an irregular hypoechoic mass at 12 o'clock 3 cm from the nipple measuring 0.7 x 0.5 x 0.9 cm. This corresponds with mammography findings. This may represent a cluster of cysts, however cannot be clearly characterized as such. No abnormalities were seen in the upper-outer left breast. No lymphadenopathy seen in the left axilla.  IMPRESSION: 1. Indeterminate mass in the left breast at 12 o'clock, which could represent a cluster of cysts, however cannot be clearly characterized as such.  2. Initially questioned possible mass in the upper-outer left breast resolves on the additional imaging with no  correlate seen on targeted ultrasound, with findings compatible with overlapping breast tissue.  RECOMMENDATION: Ultrasound-guided biopsy of the mass in the left breast at 12 o'clock is recommended. This was offered to be performed today, however the patient requests for this to be scheduled next  week.  I have discussed the findings and recommendations with the patient. Results were also provided in writing at the conclusion of the visit. If applicable, a reminder letter will be sent to the patient regarding the next appointment.  BI-RADS CATEGORY  4: Suspicious.   Electronically Signed   By: Everlean Alstrom M.D.   On: 01/17/2014 11:11    Abd U/S 12/13/13  IMPRESSION:  1. Hepatomegaly with question of mild fatty infiltration. No focal  abnormality.  2. Prior cholecystectomy

## 2014-01-31 ENCOUNTER — Ambulatory Visit (HOSPITAL_COMMUNITY): Payer: Medicare Other

## 2014-02-07 ENCOUNTER — Encounter (HOSPITAL_COMMUNITY): Payer: Self-pay

## 2014-02-07 ENCOUNTER — Ambulatory Visit (HOSPITAL_COMMUNITY)
Admission: RE | Admit: 2014-02-07 | Discharge: 2014-02-07 | Disposition: A | Discharge status: Home / Self Care | Payer: Medicare Other | Source: Ambulatory Visit | Attending: Family Medicine | Admitting: Family Medicine

## 2014-02-07 ENCOUNTER — Other Ambulatory Visit: Payer: Self-pay | Admitting: Family Medicine

## 2014-02-07 VITALS — BP 146/84 | HR 60 | Temp 98.1°F | Resp 17

## 2014-02-07 DIAGNOSIS — D242 Benign neoplasm of left breast: Secondary | ICD-10-CM | POA: Insufficient documentation

## 2014-02-07 DIAGNOSIS — N63 Unspecified lump in unspecified breast: Secondary | ICD-10-CM

## 2014-02-07 DIAGNOSIS — N6320 Unspecified lump in the left breast, unspecified quadrant: Secondary | ICD-10-CM

## 2014-02-07 DIAGNOSIS — R229 Localized swelling, mass and lump, unspecified: Secondary | ICD-10-CM

## 2014-02-07 DIAGNOSIS — IMO0002 Reserved for concepts with insufficient information to code with codable children: Secondary | ICD-10-CM

## 2014-02-07 MED ORDER — LIDOCAINE HCL (PF) 2 % IJ SOLN
INTRAMUSCULAR | Status: AC
  Filled 2014-02-07: qty 10

## 2014-02-07 MED ORDER — LIDOCAINE HCL (PF) 2 % IJ SOLN
INTRAMUSCULAR | Status: AC
  Administered 2014-02-07: 20 mL
  Filled 2014-02-07: qty 10

## 2014-02-07 NOTE — Discharge Instructions (Addendum)
Breast Biopsy A breast biopsy is a procedure where a sample of breast tissue is removed from your breast. The tissue is examined under a microscope to see if cancerous cells are present. A breast biopsy is done when there is:  Any undiagnosed breast mass (tumor).  Nipple abnormalities, dimpling, crusting, or ulcerations.  Abnormal discharge from the nipple, especially blood.  Redness, swelling, and pain of the breast.  Calcium deposits (calcifications) or abnormalities seen on a mammogram, ultrasound result, or results of magnetic resonance imaging (MRI).  Suspicious changes in the breast seen on your mammogram. If the tumor is found to be cancerous (malignant), a breast biopsy can help to determine what the best treatment is for you. There are many different types of breast biopsies. Talk to your caregiver about your options and which type is best for you. LET YOUR CAREGIVER KNOW ABOUT:  Allergies to food or medicine.  Medicines taken, including vitamins, herbs, eyedrops, over-the-counter medicines, and creams.  Use of steroids (by mouth or creams).  Previous problems with anesthetics or numbing medicines.  History of bleeding problems or blood clots.  Previous surgery.  Other health problems, including diabetes and kidney problems.  Any recent colds or infections.  Possibility of pregnancy, if this applies. RISKS AND COMPLICATIONS   Bleeding.  Infection.  Allergy to medicines.  Bruising and swelling of the breast.  Alteration in the shape of the breast.  Not finding the lump or abnormality.  Needing more surgery. BEFORE THE PROCEDURE  Arrange for someone to drive you home after the procedure.  Do not smoke for 2 weeks before the procedure. Stop smoking, if you smoke.  Do not drink alcohol for 24 hours before procedure.  Wear a good support bra to the procedure. PROCEDURE  You may be given a medicine to numb the breast area (local anesthesia) or a medicine  to make you sleep (general anesthesia) during the procedure. The following are the different types of biopsies that can be performed.   Fine-needle aspiration--A thin needle is attached to a syringe and inserted into the breast lump. Fluid and cells are removed and then looked at under a microscope. If the breast lump cannot be felt, an ultrasound may be used to help locate the lump and place the needle in the correct area.   Core needle biopsy--A wide, hollow needle (core needle) is inserted into the breast lump 3-6 times to get tissue samples or cores. The samples are removed. The needle is usually placed in the correct area by using an ultrasound or X-ray.   Stereotactic biopsy--X-ray equipment and a computer are used to analyze X-ray pictures of the breast lump. The computer then finds exactly where the core needle needs to be inserted. Tissue samples are removed.   Vacuum-assisted biopsy--A small incision (less than  inch) is made in your breast. A biopsy device that includes a hollow needle and vacuum is passed through the incision and into the breast tissue. The vacuum gently draws abnormal breast tissue into the needle to remove it. This type of biopsy removes a larger tissue sample than a regular core needle biopsy. No stitches are needed, and there is usually little scarring.  Ultrasound-guided core needle biopsy--A high frequency ultrasound helps guide the core needle to the area of the mass or abnormality. An incision is made to insert the needle. Tissue samples are removed.  Open biopsy--A larger incision is made in the breast. Your caregiver will attempt to remove the whole breast lump or  as much as possible. AFTER THE PROCEDURE  You will be taken to the recovery area. If you are doing well and have no problems, you will be allowed to go home.  You may notice bruising on your breast. This is normal.  Your caregiver may apply a pressure dressing on your breast for 24-48 hours. A  pressure dressing is a bandage that is wrapped tightly around the chest to stop fluid from collecting underneath tissues. Document Released: 04/14/2005 Document Revised: 08/09/2012 Document Reviewed: 05/15/2011 [facility] Patient Information 2015 [place], [place]. This information is not intended to replace advice given to you by your health care provider. Make sure you discuss any questions you have with your health care provider.  Breast Biopsy Care After These instructions give you information on caring for yourself after your procedure. Your doctor may also give you more specific instructions. Call your doctor if you have any problems or questions after your procedure. HOME CARE  Only take medicine as told by your doctor.  Do not take aspirin.  Keep your sutures (stitches) dry when bathing.  Protect the biopsy area. Do not let the area get bumped.  Avoid activities that could pull the biopsy site open until your doctor approves. This includes:  Stretching.  Reaching.  Exercise.  Sports.  Lifting more than 3lb.  Continue your normal diet.  Wear a good support bra for as long as told by your doctor.  Change any bandages (dressings) as told by your doctor.  Do not drink alcohol while taking pain medicine.  Keep all doctor visits as told. Ask when your test results will be ready. Make sure you get your test results. GET HELP RIGHT AWAY IF:   You have a fever.  You have more bleeding (more than a small spot) from the biopsy site.  You have trouble breathing.  You have yellowish-white fluid (pus) coming from the biopsy site.  You have redness, puffiness (swelling), or more pain in the biopsy site.  You have a bad smell coming from the biopsy site.  Your biopsy site opens after sutures, staples, or sticky strips have been removed.  You have a rash.  You need stronger medicine. MAKE SURE YOU:  Understand these instructions.  Will watch your condition.  Will get  help right away if you are not doing well or get worse. Document Released: 02/08/2009 Document Revised: 07/07/2011 Document Reviewed: 05/25/2011 [facility] Patient Information 2015 [place], [place]. This information is not intended to replace advice given to you by your health care provider. Make sure you discuss any questions you have with your health care provider.

## 2014-02-15 ENCOUNTER — Ambulatory Visit (HOSPITAL_COMMUNITY): Payer: Self-pay | Admitting: Psychiatry

## 2014-02-15 ENCOUNTER — Ambulatory Visit: Payer: Self-pay | Admitting: Gastroenterology

## 2014-02-16 ENCOUNTER — Encounter (HOSPITAL_COMMUNITY): Payer: Self-pay | Admitting: Psychiatry

## 2014-02-16 ENCOUNTER — Telehealth: Payer: Self-pay | Admitting: Family Medicine

## 2014-02-16 ENCOUNTER — Ambulatory Visit (INDEPENDENT_AMBULATORY_CARE_PROVIDER_SITE_OTHER): Payer: Medicare Other | Admitting: Psychiatry

## 2014-02-16 VITALS — BP 144/80 | HR 58 | Ht 65.0 in | Wt 179.0 lb

## 2014-02-16 DIAGNOSIS — F4310 Post-traumatic stress disorder, unspecified: Secondary | ICD-10-CM

## 2014-02-16 DIAGNOSIS — F5105 Insomnia due to other mental disorder: Secondary | ICD-10-CM

## 2014-02-16 DIAGNOSIS — F39 Unspecified mood [affective] disorder: Secondary | ICD-10-CM

## 2014-02-16 DIAGNOSIS — F1210 Cannabis abuse, uncomplicated: Secondary | ICD-10-CM

## 2014-02-16 DIAGNOSIS — F332 Major depressive disorder, recurrent severe without psychotic features: Secondary | ICD-10-CM

## 2014-02-16 MED ORDER — TRAZODONE HCL 100 MG PO TABS
50.0000 mg | ORAL_TABLET | Freq: Every day | ORAL | Status: DC
Start: 2014-02-16 — End: 2014-03-30

## 2014-02-16 MED ORDER — ARIPIPRAZOLE 2 MG PO TABS: 2.0000 mg | ORAL_TABLET | Freq: Every day | ORAL | Status: DC

## 2014-02-16 MED ORDER — FLUOXETINE HCL 20 MG PO CAPS: 80.0000 mg | ORAL_CAPSULE | Freq: Every day | ORAL | Status: DC

## 2014-02-16 MED ORDER — ALPRAZOLAM 1 MG PO TABS: 1.0000 mg | ORAL_TABLET | Freq: Four times a day (QID) | ORAL | Status: DC

## 2014-02-16 NOTE — Telephone Encounter (Signed)
[phone] °Pt is needing a refill on HYDROcodone-acetaminophen (NORCO) 10-325 MG per tablet °

## 2014-02-16 NOTE — Telephone Encounter (Signed)
Ok to refill??  Last office visit 12/02/2013.  Last refill 01/18/2014.
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
## 2014-02-17 ENCOUNTER — Ambulatory Visit (HOSPITAL_COMMUNITY): Payer: Self-pay | Admitting: Psychiatry

## 2014-02-17 MED ORDER — HYDROCODONE-ACETAMINOPHEN 10-325 MG PO TABS: 1.0000 | ORAL_TABLET | ORAL | Status: DC | PRN

## 2014-02-17 NOTE — Telephone Encounter (Signed)
Okay to refill? 

## 2014-02-17 NOTE — Telephone Encounter (Signed)
Prescription printed and patient made aware to come to office to pick up.  

## 2014-02-21 ENCOUNTER — Telehealth (HOSPITAL_COMMUNITY): Payer: Self-pay | Admitting: *Deleted

## 2014-02-21 ENCOUNTER — Other Ambulatory Visit (HOSPITAL_COMMUNITY): Payer: Self-pay | Admitting: Psychiatry

## 2014-02-21 MED ORDER — QUETIAPINE FUMARATE 25 MG PO TABS: 25.0000 mg | ORAL_TABLET | Freq: Every day | ORAL | Status: DC

## 2014-02-21 NOTE — Telephone Encounter (Signed)
Please tell her Seroquel was sent to pharmacy

## 2014-02-22 NOTE — Telephone Encounter (Signed)
Pt is aware medications will be sent to pharmacy. Pt shows understanding.

## 2014-02-24 ENCOUNTER — Telehealth: Payer: Self-pay | Admitting: Gastroenterology

## 2014-02-24 NOTE — Telephone Encounter (Signed)
Almyra Free, Do you know if Medicare will accept U/S with elastography for their fibrosis score?

## 2014-02-24 NOTE — Telephone Encounter (Signed)
OK. Let's proceed with u/s with elastography for fibrosis staging, h/o chronic HCV.

## 2014-02-24 NOTE — Telephone Encounter (Signed)
The medicare plans we have worked with so far have taken it with no problems.

## 2014-02-27 NOTE — Telephone Encounter (Signed)
Ginger please schedule.

## 2014-02-28 ENCOUNTER — Other Ambulatory Visit: Payer: Self-pay

## 2014-02-28 DIAGNOSIS — B182 Chronic viral hepatitis C: Secondary | ICD-10-CM

## 2014-02-28 NOTE — Telephone Encounter (Signed)
Pt is aware of her appointment on 03/06/14 @ 945

## 2014-03-03 ENCOUNTER — Ambulatory Visit: Payer: Medicare Other | Admitting: Family Medicine

## 2014-03-06 ENCOUNTER — Ambulatory Visit (HOSPITAL_COMMUNITY): Payer: Medicare Other

## 2014-03-07 ENCOUNTER — Encounter: Payer: Self-pay | Admitting: Family Medicine

## 2014-03-07 ENCOUNTER — Ambulatory Visit (INDEPENDENT_AMBULATORY_CARE_PROVIDER_SITE_OTHER): Payer: Medicare Other | Admitting: Family Medicine

## 2014-03-07 VITALS — BP 128/74 | HR 76 | Temp 98.4°F | Resp 14 | Ht 65.0 in | Wt 184.0 lb

## 2014-03-07 DIAGNOSIS — M170 Bilateral primary osteoarthritis of knee: Secondary | ICD-10-CM

## 2014-03-07 DIAGNOSIS — F329 Major depressive disorder, single episode, unspecified: Secondary | ICD-10-CM

## 2014-03-07 DIAGNOSIS — B182 Chronic viral hepatitis C: Secondary | ICD-10-CM | POA: Diagnosis not present

## 2014-03-07 DIAGNOSIS — F32A Depression, unspecified: Secondary | ICD-10-CM

## 2014-03-07 DIAGNOSIS — E039 Hypothyroidism, unspecified: Secondary | ICD-10-CM

## 2014-03-07 NOTE — Assessment & Plan Note (Signed)
Shower chair has been given. She is on chronic pain medication

## 2014-03-07 NOTE — Assessment & Plan Note (Signed)
With symptoms of brittle hair or nails fatigue she may benefit from the thyroid supplementation she agrees to take it for the next 8 weeks then she will have repeat thyroid function tests

## 2014-03-07 NOTE — Patient Instructions (Addendum)
[employer] for [employer] Return to stool cards  Try the thyroid medication  Recheck labs in 8 weeks in [place] F/U 4 months

## 2014-03-07 NOTE — Progress Notes (Signed)
Patient ID: Kathleen Palmer, female   DOB: 09/11/1960, 53 y.o.   MRN: [id number]   Subjective:    Patient ID: Kathleen Palmer, female    DOB: [birth year], 53 y.o.   MRN: [id number]  Patient presents for 3 month F/U   Patient here to follow-up chronic medical problems. Since her last visit she was seen by GI secondary to the hepatitis C she has a special ultrasound coming up. She'll be referred to the hepatitis C clinic in [place] for further medication dispense all.  She had abnormal mammogram which she had a biopsy that came back benign.  She was seen by her psychiatrist she has the addition of Seroquel saying she was also given Abilify but she states she cannot tolerate this therefore did not start. She also did not start the Synthroid that I prescribed for the last visit secondary to hypothyroidism she was worried about the side effects.  She requests a shower chair to help her secondary to the osteoarthritis in her knees   Review Of Systems:  GEN- denies fatigue, fever, weight loss,weakness, recent illness HEENT- denies eye drainage, change in vision, nasal discharge, CVS- denies chest pain, palpitations RESP- denies SOB, cough, wheeze ABD- denies N/V, change in stools, abd pain GU- denies dysuria, hematuria, dribbling, incontinence MSK-+joint pain, muscle aches, injury Neuro- denies headache, dizziness, syncope, seizure activity       Objective:    BP 128/74 mmHg  Pulse 76  Temp(Src) 98.4 F (36.9 C) (Oral)  Resp 14  Ht 5\' 5"  (1.651 m)  Wt 184 lb (83.462 kg)  BMI 30.62 kg/m2 GEN- NAD, alert and oriented x3 HEENT- PERRL, EOMI, non injected sclera, pink conjunctiva, MMM, oropharynx clear CVS- RRR, no murmur RESP-CTAB Psych- depressed affect, no SI, well groomed EXT- No edema Pulses- Radial, DP- 2+        Assessment & Plan:      Problem List Items Addressed This Visit    Hypothyroidism - Primary   Relevant Orders      TSH      T3, free      T4, free      Note: This dictation was prepared with Dragon dictation along with smaller phrase technology. Any transcriptional errors that result from this process are unintentional.

## 2014-03-07 NOTE — Assessment & Plan Note (Signed)
Followed by psychiatry, some improvement with seroquel added

## 2014-03-09 ENCOUNTER — Ambulatory Visit (HOSPITAL_COMMUNITY): Payer: Medicare Other

## 2014-03-15 ENCOUNTER — Ambulatory Visit (HOSPITAL_COMMUNITY)
Admission: RE | Admit: 2014-03-15 | Discharge: 2014-03-15 | Disposition: A | Discharge status: Home / Self Care | Payer: Medicare Other | Source: Ambulatory Visit | Attending: Gastroenterology | Admitting: Gastroenterology

## 2014-03-15 DIAGNOSIS — Z9049 Acquired absence of other specified parts of digestive tract: Secondary | ICD-10-CM | POA: Insufficient documentation

## 2014-03-15 DIAGNOSIS — B1920 Unspecified viral hepatitis C without hepatic coma: Secondary | ICD-10-CM | POA: Diagnosis not present

## 2014-03-15 DIAGNOSIS — B182 Chronic viral hepatitis C: Secondary | ICD-10-CM

## 2014-03-15 DIAGNOSIS — K7460 Unspecified cirrhosis of liver: Secondary | ICD-10-CM | POA: Insufficient documentation

## 2014-03-16 ENCOUNTER — Telehealth: Payer: Self-pay | Admitting: Family Medicine

## 2014-03-16 ENCOUNTER — Ambulatory Visit (HOSPITAL_COMMUNITY): Payer: Self-pay | Admitting: Psychiatry

## 2014-03-16 NOTE — Telephone Encounter (Signed)
PT is needing a refill on HYDROcodone-acetaminophen (NORCO) 10-325 MG per tablet [phone]

## 2014-03-16 NOTE — Telephone Encounter (Signed)
Ok to refill??  Last office visit 03/07/2014.  Last refill 02/17/2014.

## 2014-03-17 MED ORDER — HYDROCODONE-ACETAMINOPHEN 10-325 MG PO TABS
1.0000 | ORAL_TABLET | ORAL | Status: DC | PRN
Start: 2014-03-17 — End: 2014-04-17

## 2014-03-17 NOTE — Telephone Encounter (Signed)
Okay to refill? 

## 2014-03-17 NOTE — Telephone Encounter (Signed)
Prescription printed and patient made aware to come to office to pick up.  

## 2014-03-20 ENCOUNTER — Ambulatory Visit (HOSPITAL_COMMUNITY): Payer: Self-pay | Admitting: Psychiatry

## 2014-03-20 ENCOUNTER — Other Ambulatory Visit: Payer: Self-pay

## 2014-03-20 ENCOUNTER — Telehealth: Payer: Self-pay | Admitting: Gastroenterology

## 2014-03-20 DIAGNOSIS — B182 Chronic viral hepatitis C: Secondary | ICD-10-CM

## 2014-03-20 NOTE — Progress Notes (Signed)
Quick Note:  Pt is aware. Labs faxed to [facility]. She said to please let Neil Crouch, PA know that she will go to the lab on 03/27/2014 , she has appts until then. ______

## 2014-03-20 NOTE — Telephone Encounter (Signed)
Patient returned call from nurse   Please call back

## 2014-03-20 NOTE — Progress Notes (Signed)
Quick Note:  Please let patient know she has grade F4-F4 fibrosis. Need to get her HCV treated. We need an PT/INR, update her LFTs, CBC, Met-7. This should be final step for [facility] approval.  Please update her medication list (she has had additional meds since her OV). ______

## 2014-03-20 NOTE — Telephone Encounter (Signed)
Pt said she will now go to the lab tomorrow to do the blood work.

## 2014-03-22 ENCOUNTER — Ambulatory Visit (HOSPITAL_COMMUNITY): Payer: Self-pay | Admitting: Psychiatry

## 2014-03-30 ENCOUNTER — Encounter (HOSPITAL_COMMUNITY): Payer: Self-pay | Admitting: Psychiatry

## 2014-03-30 ENCOUNTER — Ambulatory Visit (INDEPENDENT_AMBULATORY_CARE_PROVIDER_SITE_OTHER): Payer: Medicare Other | Admitting: Psychiatry

## 2014-03-30 VITALS — BP 102/82 | HR 80 | Ht 65.0 in | Wt 184.2 lb

## 2014-03-30 DIAGNOSIS — F609 Personality disorder, unspecified: Secondary | ICD-10-CM

## 2014-03-30 DIAGNOSIS — F1210 Cannabis abuse, uncomplicated: Secondary | ICD-10-CM

## 2014-03-30 DIAGNOSIS — F32A Depression, unspecified: Secondary | ICD-10-CM

## 2014-03-30 DIAGNOSIS — F39 Unspecified mood [affective] disorder: Secondary | ICD-10-CM

## 2014-03-30 DIAGNOSIS — F4310 Post-traumatic stress disorder, unspecified: Secondary | ICD-10-CM

## 2014-03-30 DIAGNOSIS — F329 Major depressive disorder, single episode, unspecified: Secondary | ICD-10-CM

## 2014-03-30 MED ORDER — FLUOXETINE HCL 20 MG PO CAPS: 80.0000 mg | ORAL_CAPSULE | Freq: Every day | ORAL | Status: DC

## 2014-03-30 MED ORDER — QUETIAPINE FUMARATE 25 MG PO TABS: 25.0000 mg | ORAL_TABLET | Freq: Every day | ORAL | Status: DC

## 2014-03-30 MED ORDER — ALPRAZOLAM 1 MG PO TABS: 1.0000 mg | ORAL_TABLET | Freq: Four times a day (QID) | ORAL | Status: DC

## 2014-03-30 MED ORDER — TRAZODONE HCL 100 MG PO TABS
ORAL_TABLET | ORAL | Status: DC
Start: 2014-03-30 — End: 2014-05-11
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
## 2014-03-31 NOTE — Progress Notes (Signed)
Quick Note:    Noted    ______

## 2014-04-17 ENCOUNTER — Telehealth: Payer: Self-pay | Admitting: Family Medicine

## 2014-04-17 MED ORDER — HYDROCODONE-ACETAMINOPHEN 10-325 MG PO TABS: 1.0000 | ORAL_TABLET | ORAL | Status: DC | PRN

## 2014-04-17 NOTE — Telephone Encounter (Signed)
okay

## 2014-04-17 NOTE — Telephone Encounter (Signed)
Prescription printed and patient made aware to come to office to pick up.  

## 2014-04-17 NOTE — Telephone Encounter (Signed)
Ok to refill??  Last office visit 03/07/2014.  Last refill 03/17/2014.

## 2014-04-17 NOTE — Telephone Encounter (Signed)
Patient requesting refill on her hydrocodone  [phone]

## 2014-04-19 ENCOUNTER — Ambulatory Visit (INDEPENDENT_AMBULATORY_CARE_PROVIDER_SITE_OTHER): Payer: Medicare Other | Admitting: Psychiatry

## 2014-04-19 DIAGNOSIS — F39 Unspecified mood [affective] disorder: Secondary | ICD-10-CM

## 2014-04-19 NOTE — Patient Instructions (Signed)
Discussed orally 
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
## 2014-05-11 ENCOUNTER — Ambulatory Visit (INDEPENDENT_AMBULATORY_CARE_PROVIDER_SITE_OTHER): Payer: Medicare Other | Admitting: Psychiatry

## 2014-05-11 ENCOUNTER — Encounter (HOSPITAL_COMMUNITY): Payer: Self-pay | Admitting: Psychiatry

## 2014-05-11 VITALS — BP 125/72 | HR 57 | Ht 65.0 in | Wt 182.0 lb

## 2014-05-11 DIAGNOSIS — F1290 Cannabis use, unspecified, uncomplicated: Secondary | ICD-10-CM | POA: Diagnosis not present

## 2014-05-11 DIAGNOSIS — F329 Major depressive disorder, single episode, unspecified: Secondary | ICD-10-CM

## 2014-05-11 DIAGNOSIS — F4310 Post-traumatic stress disorder, unspecified: Secondary | ICD-10-CM | POA: Diagnosis not present

## 2014-05-11 DIAGNOSIS — F39 Unspecified mood [affective] disorder: Secondary | ICD-10-CM | POA: Diagnosis not present

## 2014-05-11 MED ORDER — QUETIAPINE FUMARATE 25 MG PO TABS: 25.0000 mg | ORAL_TABLET | Freq: Every day | ORAL | Status: DC

## 2014-05-11 MED ORDER — FLUOXETINE HCL 20 MG PO CAPS: 80.0000 mg | ORAL_CAPSULE | Freq: Every day | ORAL | Status: DC

## 2014-05-11 MED ORDER — LAMOTRIGINE 25 MG PO TABS: ORAL_TABLET | ORAL | Status: DC

## 2014-05-11 MED ORDER — TRAZODONE HCL 100 MG PO TABS: ORAL_TABLET | ORAL | Status: DC
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
## 2014-05-12 ENCOUNTER — Ambulatory Visit (INDEPENDENT_AMBULATORY_CARE_PROVIDER_SITE_OTHER): Payer: Medicare Other | Admitting: Psychiatry

## 2014-05-12 DIAGNOSIS — F39 Unspecified mood [affective] disorder: Secondary | ICD-10-CM | POA: Diagnosis not present

## 2014-05-12 NOTE — Patient Instructions (Signed)
Discussed orally 
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
## 2014-05-15 ENCOUNTER — Telehealth: Payer: Self-pay | Admitting: Family Medicine

## 2014-05-15 MED ORDER — HYDROCODONE-ACETAMINOPHEN 10-325 MG PO TABS: 1.0000 | ORAL_TABLET | ORAL | Status: DC | PRN

## 2014-05-15 NOTE — Telephone Encounter (Signed)
Prescription printed and patient made aware to come to office to pick up.  

## 2014-05-15 NOTE — Telephone Encounter (Signed)
Ok to refill??  Last office visit 03/07/2014.  Last refill 04/17/2014.

## 2014-05-15 NOTE — Telephone Encounter (Signed)
Patient calling for refill on hydrocodone  [phone]

## 2014-05-15 NOTE — Telephone Encounter (Signed)
ok 

## 2014-06-02 ENCOUNTER — Ambulatory Visit (INDEPENDENT_AMBULATORY_CARE_PROVIDER_SITE_OTHER): Payer: Medicare Other | Admitting: Psychiatry

## 2014-06-02 DIAGNOSIS — F39 Unspecified mood [affective] disorder: Secondary | ICD-10-CM

## 2014-06-02 NOTE — Patient Instructions (Signed)
Discussed orally 
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
## 2014-06-08 ENCOUNTER — Ambulatory Visit (HOSPITAL_COMMUNITY): Payer: Self-pay | Admitting: Psychiatry

## 2014-06-12 ENCOUNTER — Ambulatory Visit (HOSPITAL_COMMUNITY): Payer: Self-pay | Admitting: Psychiatry

## 2014-06-13 ENCOUNTER — Telehealth: Payer: Self-pay | Admitting: Family Medicine

## 2014-06-13 MED ORDER — HYDROCODONE-ACETAMINOPHEN 10-325 MG PO TABS: 1.0000 | ORAL_TABLET | ORAL | Status: DC | PRN

## 2014-06-13 NOTE — Telephone Encounter (Signed)
Okay to refill? 

## 2014-06-13 NOTE — Telephone Encounter (Signed)
[phone]  PT is needing a refill on HYDROcodone-acetaminophen (NORCO) 10-325 MG per tablet

## 2014-06-13 NOTE — Telephone Encounter (Signed)
Prescription printed and patient made aware to come to office to pick up.  

## 2014-06-13 NOTE — Telephone Encounter (Signed)
Ok to refill??  Last office visit 03/07/2014.  Last refill 05/15/2014.

## 2014-06-21 ENCOUNTER — Telehealth (HOSPITAL_COMMUNITY): Payer: Self-pay | Admitting: *Deleted

## 2014-06-21 ENCOUNTER — Ambulatory Visit (HOSPITAL_COMMUNITY): Payer: Self-pay | Admitting: Psychiatry

## 2014-06-21 NOTE — Telephone Encounter (Signed)
noted 

## 2014-06-21 NOTE — Telephone Encounter (Signed)
Patient canceled late today.  previous No Shows or late CANCEL:  06/01/13; 06/22/13; 07/05/13; 07/06/13; 09/01/13; 09/06/13; 10/03/13; 02/15/14; 02/17/14; 03/16/14; 03/22/14; 06/08/14; 06/21/14

## 2014-06-22 ENCOUNTER — Telehealth (HOSPITAL_COMMUNITY): Payer: Self-pay | Admitting: *Deleted

## 2014-06-23 ENCOUNTER — Ambulatory Visit (HOSPITAL_COMMUNITY): Payer: Self-pay | Admitting: Psychiatry

## 2014-06-30 ENCOUNTER — Ambulatory Visit (INDEPENDENT_AMBULATORY_CARE_PROVIDER_SITE_OTHER): Payer: Medicare Other | Admitting: Psychiatry

## 2014-06-30 ENCOUNTER — Encounter (HOSPITAL_COMMUNITY): Payer: Self-pay | Admitting: Psychiatry

## 2014-06-30 VITALS — BP 131/92 | HR 70 | Ht 65.0 in | Wt 185.8 lb

## 2014-06-30 DIAGNOSIS — F1210 Cannabis abuse, uncomplicated: Secondary | ICD-10-CM | POA: Diagnosis not present

## 2014-06-30 DIAGNOSIS — F39 Unspecified mood [affective] disorder: Secondary | ICD-10-CM

## 2014-06-30 DIAGNOSIS — F329 Major depressive disorder, single episode, unspecified: Secondary | ICD-10-CM

## 2014-06-30 DIAGNOSIS — F32A Depression, unspecified: Secondary | ICD-10-CM

## 2014-06-30 MED ORDER — TRAZODONE HCL 100 MG PO TABS: ORAL_TABLET | ORAL | Status: DC

## 2014-06-30 MED ORDER — FLUOXETINE HCL 20 MG PO CAPS: 80.0000 mg | ORAL_CAPSULE | Freq: Every day | ORAL | Status: DC

## 2014-06-30 MED ORDER — ALPRAZOLAM 1 MG PO TABS: 1.0000 mg | ORAL_TABLET | Freq: Four times a day (QID) | ORAL | Status: DC

## 2014-06-30 MED ORDER — QUETIAPINE FUMARATE 25 MG PO TABS: 25.0000 mg | ORAL_TABLET | Freq: Every day | ORAL | Status: DC
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
## 2014-07-06 ENCOUNTER — Ambulatory Visit (HOSPITAL_COMMUNITY): Payer: Self-pay | Admitting: Psychiatry

## 2014-07-12 ENCOUNTER — Telehealth: Payer: Self-pay | Admitting: Family Medicine

## 2014-07-12 MED ORDER — HYDROCODONE-ACETAMINOPHEN 10-325 MG PO TABS
1.0000 | ORAL_TABLET | ORAL | Status: DC | PRN
Start: 2014-07-12 — End: 2014-08-14

## 2014-07-12 NOTE — Telephone Encounter (Signed)
[phone] PT is needing a refill on HYDROcodone-acetaminophen (NORCO) 10-325 MG per tablet

## 2014-07-12 NOTE — Telephone Encounter (Signed)
OK to refill

## 2014-07-12 NOTE — Telephone Encounter (Signed)
Script printed, provider signed and pt ready to pick kup

## 2014-07-12 NOTE — Telephone Encounter (Signed)
okay

## 2014-07-19 ENCOUNTER — Encounter (HOSPITAL_COMMUNITY): Payer: Self-pay | Admitting: Psychiatry

## 2014-07-21 ENCOUNTER — Encounter: Payer: Self-pay | Admitting: Family Medicine

## 2014-08-14 ENCOUNTER — Telehealth: Payer: Self-pay | Admitting: Family Medicine

## 2014-08-14 MED ORDER — HYDROCODONE-ACETAMINOPHEN 10-325 MG PO TABS: 1.0000 | ORAL_TABLET | ORAL | Status: DC | PRN

## 2014-08-14 NOTE — Telephone Encounter (Signed)
Patient is calling to get refill on her hydrocodone  [phone]

## 2014-08-14 NOTE — Telephone Encounter (Signed)
Ok to refill??  Last office visit 03/07/2014.  Last refill 07/12/2014.

## 2014-08-14 NOTE — Telephone Encounter (Signed)
Okay to refill? 

## 2014-08-14 NOTE — Telephone Encounter (Signed)
Call placed to patient and patient made aware.  

## 2014-08-18 ENCOUNTER — Ambulatory Visit (INDEPENDENT_AMBULATORY_CARE_PROVIDER_SITE_OTHER): Payer: Medicare Other | Admitting: Psychiatry

## 2014-08-18 DIAGNOSIS — F32A Depression, unspecified: Secondary | ICD-10-CM

## 2014-08-18 DIAGNOSIS — F329 Major depressive disorder, single episode, unspecified: Secondary | ICD-10-CM | POA: Diagnosis not present

## 2014-08-18 NOTE — Patient Instructions (Signed)
Discussed orally 
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
## 2014-08-20 ENCOUNTER — Other Ambulatory Visit (HOSPITAL_COMMUNITY): Payer: Self-pay | Admitting: Psychiatry

## 2014-08-30 ENCOUNTER — Ambulatory Visit (INDEPENDENT_AMBULATORY_CARE_PROVIDER_SITE_OTHER): Payer: Medicare Other | Admitting: Psychiatry

## 2014-08-30 ENCOUNTER — Encounter (HOSPITAL_COMMUNITY): Payer: Self-pay | Admitting: Psychiatry

## 2014-08-30 VITALS — BP 147/95 | HR 68 | Ht 65.0 in | Wt 175.0 lb

## 2014-08-30 DIAGNOSIS — F39 Unspecified mood [affective] disorder: Secondary | ICD-10-CM | POA: Diagnosis not present

## 2014-08-30 DIAGNOSIS — F329 Major depressive disorder, single episode, unspecified: Secondary | ICD-10-CM

## 2014-08-30 DIAGNOSIS — F1210 Cannabis abuse, uncomplicated: Secondary | ICD-10-CM

## 2014-08-30 DIAGNOSIS — F4310 Post-traumatic stress disorder, unspecified: Secondary | ICD-10-CM | POA: Diagnosis not present

## 2014-08-30 DIAGNOSIS — F32A Depression, unspecified: Secondary | ICD-10-CM

## 2014-08-30 MED ORDER — FLUOXETINE HCL 20 MG PO CAPS: 80.0000 mg | ORAL_CAPSULE | Freq: Every day | ORAL | Status: DC

## 2014-08-30 MED ORDER — TRAZODONE HCL 100 MG PO TABS: ORAL_TABLET | ORAL | Status: DC

## 2014-08-30 MED ORDER — ALPRAZOLAM 1 MG PO TABS: 1.0000 mg | ORAL_TABLET | Freq: Four times a day (QID) | ORAL | Status: DC

## 2014-08-30 MED ORDER — QUETIAPINE FUMARATE 25 MG PO TABS: 25.0000 mg | ORAL_TABLET | Freq: Every day | ORAL | Status: DC
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
## 2014-09-08 ENCOUNTER — Other Ambulatory Visit (HOSPITAL_COMMUNITY): Payer: Self-pay | Admitting: Psychiatry

## 2014-09-11 ENCOUNTER — Telehealth: Payer: Self-pay | Admitting: Family Medicine

## 2014-09-11 MED ORDER — HYDROCODONE-ACETAMINOPHEN 10-325 MG PO TABS: 1.0000 | ORAL_TABLET | ORAL | Status: DC | PRN

## 2014-09-11 NOTE — Telephone Encounter (Signed)
[phone] PT is needing a refill on HYDROcodone-acetaminophen (NORCO) 10-325 MG per tablet

## 2014-09-11 NOTE — Telephone Encounter (Signed)
Okay to refill? 

## 2014-09-11 NOTE — Telephone Encounter (Signed)
Script printed ready for provider signature 

## 2014-09-11 NOTE — Telephone Encounter (Signed)
?  ok to refill?  Last refill 08/14/14  Last ov 02/26/14

## 2014-09-13 ENCOUNTER — Telehealth (HOSPITAL_COMMUNITY): Payer: Self-pay | Admitting: *Deleted

## 2014-09-29 ENCOUNTER — Ambulatory Visit (HOSPITAL_COMMUNITY): Payer: Self-pay | Admitting: Psychiatry

## 2014-10-05 ENCOUNTER — Ambulatory Visit (INDEPENDENT_AMBULATORY_CARE_PROVIDER_SITE_OTHER): Payer: Medicare Other | Admitting: Psychiatry

## 2014-10-05 DIAGNOSIS — F329 Major depressive disorder, single episode, unspecified: Secondary | ICD-10-CM | POA: Diagnosis not present

## 2014-10-05 DIAGNOSIS — F32A Depression, unspecified: Secondary | ICD-10-CM
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
## 2014-10-05 NOTE — Patient Instructions (Signed)
Discussed orally 

## 2014-10-10 ENCOUNTER — Encounter: Payer: Self-pay | Admitting: *Deleted

## 2014-10-10 ENCOUNTER — Telehealth: Payer: Self-pay | Admitting: Family Medicine

## 2014-10-10 NOTE — Telephone Encounter (Signed)
Okay to give refill, Advise no further refills unless she comes for appt within next 30 days, needs to be seen

## 2014-10-10 NOTE — Telephone Encounter (Signed)
[phone] Pt is needing a refill on HYDROcodone-acetaminophen (NORCO) 10-325 MG per tablet

## 2014-10-10 NOTE — Telephone Encounter (Signed)
Ok to refill??  Last office visit 03/07/2014.  Last refill 09/11/2014.  Of note, patient is overdue for OV. Letter sent.

## 2014-10-11 MED ORDER — HYDROCODONE-ACETAMINOPHEN 10-325 MG PO TABS: 1.0000 | ORAL_TABLET | ORAL | Status: DC | PRN

## 2014-10-11 NOTE — Telephone Encounter (Signed)
Prescription printed and patient made aware to come to office to pick up after 2pm on 10/11/2014.  Appointment scheduled.

## 2014-10-17 ENCOUNTER — Ambulatory Visit: Payer: Medicare Other | Admitting: Family Medicine

## 2014-10-23 ENCOUNTER — Ambulatory Visit: Payer: Medicare Other | Admitting: Family Medicine

## 2014-10-23 ENCOUNTER — Other Ambulatory Visit: Payer: Self-pay | Admitting: Family Medicine

## 2014-10-23 ENCOUNTER — Telehealth: Payer: Self-pay | Admitting: Gastroenterology

## 2014-10-23 ENCOUNTER — Other Ambulatory Visit: Payer: Medicare Other

## 2014-10-23 DIAGNOSIS — E039 Hypothyroidism, unspecified: Secondary | ICD-10-CM | POA: Diagnosis not present

## 2014-10-23 DIAGNOSIS — R7309 Other abnormal glucose: Secondary | ICD-10-CM | POA: Diagnosis not present

## 2014-10-23 DIAGNOSIS — R030 Elevated blood-pressure reading, without diagnosis of hypertension: Secondary | ICD-10-CM

## 2014-10-23 DIAGNOSIS — E669 Obesity, unspecified: Secondary | ICD-10-CM | POA: Diagnosis not present

## 2014-10-23 LAB — COMPLETE METABOLIC PANEL WITH GFR
ALT: 42 U/L — ABNORMAL HIGH (ref 0–35)
AST: 28 U/L (ref 0–37)
Albumin: 4.2 g/dL (ref 3.5–5.2)
Alkaline Phosphatase: 56 U/L (ref 39–117)
BUN: 16 mg/dL (ref 6–23)
CALCIUM: 9.5 mg/dL (ref 8.4–10.5)
CO2: 26 meq/L (ref 19–32)
CREATININE: 0.83 mg/dL (ref 0.50–1.10)
Chloride: 105 mEq/L (ref 96–112)
GFR, EST NON AFRICAN AMERICAN: 80 mL/min
Glucose, Bld: 114 mg/dL — ABNORMAL HIGH (ref 70–99)
Potassium: 4.1 mEq/L (ref 3.5–5.3)
Sodium: 144 mEq/L (ref 135–145)
Total Bilirubin: 0.4 mg/dL (ref 0.2–1.2)
Total Protein: 7 g/dL (ref 6.0–8.3)

## 2014-10-23 LAB — LIPID PANEL
Cholesterol: 190 mg/dL (ref 0–200)
HDL: 54 mg/dL (ref >=46)
LDL CALC: 107 mg/dL — AB (ref 0–99)
Total CHOL/HDL Ratio: 3.5 Ratio
Triglycerides: 147 mg/dL (ref <150)
VLDL: 29 mg/dL (ref 0–40)

## 2014-10-23 LAB — CBC WITH DIFFERENTIAL/PLATELET
BASOS ABS: 0 10*3/uL (ref 0.0–0.1)
Basophils Relative: 0 % (ref 0–1)
EOS PCT: 0 % (ref 0–5)
Eosinophils Absolute: 0 10*3/uL (ref 0.0–0.7)
HCT: 43.1 % (ref 36.0–46.0)
HEMOGLOBIN: 14.4 g/dL (ref 12.0–15.0)
LYMPHS ABS: 1.5 10*3/uL (ref 0.7–4.0)
LYMPHS PCT: 21 % (ref 12–46)
MCH: 29.4 pg (ref 26.0–34.0)
MCHC: 33.4 g/dL (ref 30.0–36.0)
MCV: 88 fL (ref 78.0–100.0)
MPV: 11 fL (ref 8.6–12.4)
Monocytes Absolute: 0.4 10*3/uL (ref 0.1–1.0)
Monocytes Relative: 6 % (ref 3–12)
NEUTROS ABS: 5.1 10*3/uL (ref 1.7–7.7)
NEUTROS PCT: 73 % (ref 43–77)
PLATELETS: 193 10*3/uL (ref 150–400)
RBC: 4.9 MIL/uL (ref 3.87–5.11)
RDW: 14.8 % (ref 11.5–15.5)
WBC: 7 10*3/uL (ref 4.0–10.5)

## 2014-10-23 LAB — T3, FREE: T3 FREE: 2.4 pg/mL (ref 2.3–4.2)

## 2014-10-23 LAB — TSH: TSH: 2.506 u[IU]/mL (ref 0.350–4.500)

## 2014-10-23 LAB — T4, FREE: Free T4: 0.68 ng/dL — ABNORMAL LOW (ref 0.80–1.80)

## 2014-10-23 NOTE — Telephone Encounter (Signed)
Please touch base with patient. 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 She never completed process to get started on HCV treatment but has had a lot of issues with depression per EPIC.   At this point I would recommend she have referral to [facility] in [place] to discuss HCV treatment options.  Routine follow up here for liver disease (F3-4) in six months.

## 2014-10-24 ENCOUNTER — Ambulatory Visit: Payer: Medicare Other | Admitting: Family Medicine

## 2014-10-24 LAB — HEMOGLOBIN A1C
HEMOGLOBIN A1C: 5.7 % — AB (ref <5.7)
MEAN PLASMA GLUCOSE: 117 mg/dL — AB (ref <117)

## 2014-10-25 NOTE — Telephone Encounter (Signed)
Letter mailed to pt to call.  

## 2014-10-25 NOTE — Telephone Encounter (Signed)
Called. Many rings and no answer.  

## 2014-10-26 ENCOUNTER — Ambulatory Visit (HOSPITAL_COMMUNITY): Payer: Self-pay | Admitting: Psychiatry

## 2014-10-27 ENCOUNTER — Encounter: Payer: Self-pay | Admitting: Family Medicine

## 2014-10-27 ENCOUNTER — Ambulatory Visit (HOSPITAL_COMMUNITY): Payer: Self-pay | Admitting: Psychiatry

## 2014-10-27 ENCOUNTER — Ambulatory Visit (INDEPENDENT_AMBULATORY_CARE_PROVIDER_SITE_OTHER): Payer: Medicare Other | Admitting: Family Medicine

## 2014-10-27 VITALS — BP 130/78 | HR 68 | Temp 98.2°F | Resp 16 | Wt 172.0 lb

## 2014-10-27 DIAGNOSIS — E038 Other specified hypothyroidism: Secondary | ICD-10-CM

## 2014-10-27 DIAGNOSIS — Z23 Encounter for immunization: Secondary | ICD-10-CM | POA: Diagnosis not present

## 2014-10-27 DIAGNOSIS — B182 Chronic viral hepatitis C: Secondary | ICD-10-CM

## 2014-10-27 DIAGNOSIS — F32A Depression, unspecified: Secondary | ICD-10-CM

## 2014-10-27 DIAGNOSIS — M170 Bilateral primary osteoarthritis of knee: Secondary | ICD-10-CM | POA: Diagnosis not present

## 2014-10-27 DIAGNOSIS — F329 Major depressive disorder, single episode, unspecified: Secondary | ICD-10-CM

## 2014-10-27 DIAGNOSIS — E039 Hypothyroidism, unspecified: Secondary | ICD-10-CM

## 2014-10-27 MED ORDER — HYDROCODONE-ACETAMINOPHEN 10-325 MG PO TABS: 1.0000 | ORAL_TABLET | ORAL | Status: DC | PRN

## 2014-10-27 NOTE — Patient Instructions (Signed)
Referral to orthopedics Cholesterol looks good Watch your intake  Tetatnus Boost F/U 6 months
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
## 2014-10-30 ENCOUNTER — Encounter: Payer: Self-pay | Admitting: Family Medicine

## 2014-10-30 NOTE — Assessment & Plan Note (Signed)
Severe major depression, PTSD, anxiety, which has been very difficult to control, her behavior and affect are her baseline today, no sign of SI. She has followup with her psychiatrist and therapist. We did discuss importancce of her coming to her appointments especially with her medications

## 2014-10-30 NOTE — Assessment & Plan Note (Signed)
Declines treatment , despite knowing consequences of disease if left untreated

## 2014-10-30 NOTE — Assessment & Plan Note (Addendum)
Declines treatment, repeat TFT today look okay   No sign of infected cat scratches, TDAP given

## 2014-10-30 NOTE — Assessment & Plan Note (Signed)
Referral to orthopedics Continue pain meds, used for chronic pelvic, back, joint pain

## 2014-11-07 ENCOUNTER — Ambulatory Visit (INDEPENDENT_AMBULATORY_CARE_PROVIDER_SITE_OTHER): Payer: Medicare Other | Admitting: Psychiatry

## 2014-11-07 ENCOUNTER — Encounter (HOSPITAL_COMMUNITY): Payer: Self-pay | Admitting: Psychiatry

## 2014-11-07 VITALS — BP 118/82 | Ht 65.0 in | Wt 173.0 lb

## 2014-11-07 DIAGNOSIS — F39 Unspecified mood [affective] disorder: Secondary | ICD-10-CM

## 2014-11-07 DIAGNOSIS — F329 Major depressive disorder, single episode, unspecified: Secondary | ICD-10-CM

## 2014-11-07 DIAGNOSIS — F32A Depression, unspecified: Secondary | ICD-10-CM

## 2014-11-07 DIAGNOSIS — F4310 Post-traumatic stress disorder, unspecified: Secondary | ICD-10-CM | POA: Diagnosis not present

## 2014-11-07 DIAGNOSIS — F1210 Cannabis abuse, uncomplicated: Secondary | ICD-10-CM

## 2014-11-07 MED ORDER — QUETIAPINE FUMARATE 25 MG PO TABS: 25.0000 mg | ORAL_TABLET | Freq: Every day | ORAL | Status: DC

## 2014-11-07 MED ORDER — ALPRAZOLAM 1 MG PO TABS: 1.0000 mg | ORAL_TABLET | Freq: Four times a day (QID) | ORAL | Status: DC

## 2014-11-07 MED ORDER — FLUOXETINE HCL 20 MG PO CAPS: 80.0000 mg | ORAL_CAPSULE | Freq: Every day | ORAL | Status: DC

## 2014-11-07 MED ORDER — TRAZODONE HCL 100 MG PO TABS: ORAL_TABLET | ORAL | Status: DC
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
## 2014-11-14 ENCOUNTER — Telehealth: Payer: Self-pay | Admitting: *Deleted

## 2014-11-14 ENCOUNTER — Ambulatory Visit: Payer: Self-pay | Admitting: Orthopedic Surgery

## 2014-11-14 NOTE — Telephone Encounter (Signed)
-----   Message from Uvaldo Bristle sent at 11/13/2014 11:51 AM EDT ----- Gabriel Cirri,  Just wanted to let you know that referred patient, Kathleen Palmer, Kathleen Palmer [[id number]], has re-scheduled her app't with Dr Aline Brochure from 11/14/14 to 12/12/14; due to other medical issues.    Thank you, [place] [facility]

## 2014-11-14 NOTE — Telephone Encounter (Signed)
noted 

## 2014-11-17 ENCOUNTER — Ambulatory Visit (HOSPITAL_COMMUNITY): Payer: Self-pay | Admitting: Psychiatry

## 2014-11-23 ENCOUNTER — Ambulatory Visit (INDEPENDENT_AMBULATORY_CARE_PROVIDER_SITE_OTHER): Payer: Medicare Other | Admitting: Psychiatry

## 2014-11-23 DIAGNOSIS — F329 Major depressive disorder, single episode, unspecified: Secondary | ICD-10-CM | POA: Diagnosis not present

## 2014-11-23 DIAGNOSIS — F32A Depression, unspecified: Secondary | ICD-10-CM

## 2014-11-24 NOTE — Patient Instructions (Signed)
Discussed orally 
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
## 2014-12-01 ENCOUNTER — Telehealth: Payer: Self-pay | Admitting: *Deleted

## 2014-12-01 NOTE — Telephone Encounter (Signed)
Prescription to be faxed to [facility].

## 2014-12-01 NOTE — Telephone Encounter (Signed)
Pt calling stating she is needing the overflow bag for night time use for her ostomy(?) and needs to be sent to [facility] and give them reference number [id number] and they will send out the order today.  [place] (F)[phone] (f) [phone] and stated she is needing today.

## 2014-12-01 NOTE — Telephone Encounter (Signed)
Call placed to [facility]. Discussed with [place] department.   States that patient called in and requested part number that she wanted to pick up today. Advised that prescription is required to file insurance.   States that prescription should read "Urinary drainage bag- closed system" with Dx code.   Ok to order?

## 2014-12-01 NOTE — Telephone Encounter (Signed)
Okay to order?

## 2014-12-07 ENCOUNTER — Telehealth: Payer: Self-pay | Admitting: Family Medicine

## 2014-12-07 NOTE — Telephone Encounter (Signed)
Ok to refill??  Last office visit/ refill 10/27/2014.

## 2014-12-07 NOTE — Telephone Encounter (Signed)
Patient called in requesting a prescription for her HYDROcodone-acetaminophen ([place]) 10-325 MG she states it isn't due until Sunday but would like to pick up this week.

## 2014-12-08 MED ORDER — HYDROCODONE-ACETAMINOPHEN 10-325 MG PO TABS: 1.0000 | ORAL_TABLET | ORAL | Status: DC | PRN

## 2014-12-08 NOTE — Telephone Encounter (Signed)
Okay to refill? 

## 2014-12-08 NOTE — Telephone Encounter (Signed)
RX printed, left up front and patient aware to pick up after 2 pm  

## 2014-12-12 ENCOUNTER — Telehealth: Payer: Self-pay | Admitting: Family Medicine

## 2014-12-12 ENCOUNTER — Telehealth (HOSPITAL_COMMUNITY): Payer: Self-pay | Admitting: *Deleted

## 2014-12-12 ENCOUNTER — Ambulatory Visit: Payer: Self-pay | Admitting: Orthopedic Surgery

## 2014-12-12 MED ORDER — UNABLE TO FIND: Status: DC

## 2014-12-12 NOTE — Telephone Encounter (Signed)
Pt needs Rx for ostomy night drainage collectors to go to [facility] [phone]  RX sent

## 2014-12-15 ENCOUNTER — Encounter (HOSPITAL_COMMUNITY): Payer: Self-pay | Admitting: Psychiatry

## 2014-12-15 ENCOUNTER — Ambulatory Visit (INDEPENDENT_AMBULATORY_CARE_PROVIDER_SITE_OTHER): Payer: Medicare Other | Admitting: Psychiatry

## 2014-12-15 DIAGNOSIS — F329 Major depressive disorder, single episode, unspecified: Secondary | ICD-10-CM

## 2014-12-15 DIAGNOSIS — F32A Depression, unspecified: Secondary | ICD-10-CM

## 2014-12-15 NOTE — Patient Instructions (Signed)
Discussed orally 
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
## 2015-01-04 ENCOUNTER — Telehealth: Payer: Self-pay | Admitting: *Deleted

## 2015-01-04 NOTE — Telephone Encounter (Signed)
Pt called needing refills on her urostomy bag states she is running out and needs year supply  The ostomy bag is called NEW IMAGE 9" 10ct urostomy pouch number [phone]  Also needs Skin Barrier [phone]  Called into [place]  Pt is also calling in for her pain medication hydrocodone 10-325mg    Last rf 12/09/14  Last ov 10/27/14

## 2015-01-04 NOTE — Telephone Encounter (Signed)
Ok to refill 

## 2015-01-05 MED ORDER — HYDROCODONE-ACETAMINOPHEN 10-325 MG PO TABS: 1.0000 | ORAL_TABLET | ORAL | Status: DC | PRN

## 2015-01-05 NOTE — Telephone Encounter (Signed)
Prescription printed and patient husband made aware to come to office to pick up after 2pm on 01/05/2015.  Will contact DME for further details on order once open.

## 2015-01-05 NOTE — Telephone Encounter (Signed)
Okay to refill pain meds, okay to send supplies for 1 year

## 2015-01-08 ENCOUNTER — Telehealth: Payer: Self-pay | Admitting: *Deleted

## 2015-01-08 ENCOUNTER — Ambulatory Visit (HOSPITAL_COMMUNITY): Payer: Self-pay | Admitting: Psychiatry

## 2015-01-08 NOTE — Telephone Encounter (Signed)
-----   Message from Uvaldo Bristle sent at 01/08/2015  4:29 PM EDT ----- Gabriel Cirri,  Thank you for referral of this patient - Tatia Petrucci [phone] -  She has re-scheduled appointment w/Dr Aline Brochure for the 2nd time, and requested it to be further out; re-scheduled to 02/13/15.  Thanks again, [place] [facility]

## 2015-01-08 NOTE — Telephone Encounter (Signed)
Call placed to DME.   Advised that order should read ostomy pouches and ostomy supplies.   Prescription printed for MD.

## 2015-01-09 ENCOUNTER — Ambulatory Visit: Payer: Medicare Other | Admitting: Orthopedic Surgery

## 2015-01-10 ENCOUNTER — Ambulatory Visit (INDEPENDENT_AMBULATORY_CARE_PROVIDER_SITE_OTHER): Payer: Medicare Other | Admitting: Psychiatry

## 2015-01-10 ENCOUNTER — Ambulatory Visit (HOSPITAL_COMMUNITY): Payer: Self-pay | Admitting: Psychiatry

## 2015-01-10 ENCOUNTER — Encounter (HOSPITAL_COMMUNITY): Payer: Self-pay | Admitting: Psychiatry

## 2015-01-10 VITALS — BP 120/83 | HR 61 | Ht 65.0 in | Wt 174.0 lb

## 2015-01-10 DIAGNOSIS — F1210 Cannabis abuse, uncomplicated: Secondary | ICD-10-CM | POA: Diagnosis not present

## 2015-01-10 DIAGNOSIS — F39 Unspecified mood [affective] disorder: Secondary | ICD-10-CM

## 2015-01-10 DIAGNOSIS — F329 Major depressive disorder, single episode, unspecified: Secondary | ICD-10-CM

## 2015-01-10 DIAGNOSIS — F4310 Post-traumatic stress disorder, unspecified: Secondary | ICD-10-CM

## 2015-01-10 DIAGNOSIS — F32A Depression, unspecified: Secondary | ICD-10-CM

## 2015-01-10 MED ORDER — ALPRAZOLAM 1 MG PO TABS: 1.0000 mg | ORAL_TABLET | Freq: Four times a day (QID) | ORAL | Status: DC

## 2015-01-10 MED ORDER — FLUOXETINE HCL 20 MG PO CAPS: 80.0000 mg | ORAL_CAPSULE | Freq: Every day | ORAL | Status: DC

## 2015-01-10 MED ORDER — TRAZODONE HCL 100 MG PO TABS: ORAL_TABLET | ORAL | Status: DC
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
## 2015-01-16 ENCOUNTER — Ambulatory Visit (HOSPITAL_COMMUNITY): Payer: Self-pay | Admitting: Psychiatry

## 2015-01-30 ENCOUNTER — Ambulatory Visit: Payer: 59 | Admitting: Family Medicine

## 2015-01-31 ENCOUNTER — Encounter (HOSPITAL_COMMUNITY): Payer: Self-pay | Admitting: Psychiatry

## 2015-01-31 ENCOUNTER — Ambulatory Visit (INDEPENDENT_AMBULATORY_CARE_PROVIDER_SITE_OTHER): Payer: Medicare Other | Admitting: Psychiatry

## 2015-01-31 ENCOUNTER — Ambulatory Visit: Payer: Medicare Other | Admitting: Family Medicine

## 2015-01-31 ENCOUNTER — Ambulatory Visit (INDEPENDENT_AMBULATORY_CARE_PROVIDER_SITE_OTHER): Payer: Medicare Other | Admitting: Family Medicine

## 2015-01-31 ENCOUNTER — Encounter: Payer: Self-pay | Admitting: Family Medicine

## 2015-01-31 VITALS — BP 128/70 | HR 72 | Temp 98.4°F | Resp 14 | Ht 65.0 in | Wt 172.0 lb

## 2015-01-31 DIAGNOSIS — M67432 Ganglion, left wrist: Secondary | ICD-10-CM | POA: Diagnosis not present

## 2015-01-31 DIAGNOSIS — H9313 Tinnitus, bilateral: Secondary | ICD-10-CM

## 2015-01-31 DIAGNOSIS — R94120 Abnormal auditory function study: Secondary | ICD-10-CM | POA: Diagnosis not present

## 2015-01-31 DIAGNOSIS — F329 Major depressive disorder, single episode, unspecified: Secondary | ICD-10-CM | POA: Diagnosis not present

## 2015-01-31 DIAGNOSIS — R413 Other amnesia: Secondary | ICD-10-CM

## 2015-01-31 DIAGNOSIS — F32A Depression, unspecified: Secondary | ICD-10-CM

## 2015-01-31 DIAGNOSIS — Z820 Family history of epilepsy and other diseases of the nervous system: Secondary | ICD-10-CM | POA: Diagnosis not present

## 2015-01-31 DIAGNOSIS — F333 Major depressive disorder, recurrent, severe with psychotic symptoms: Secondary | ICD-10-CM | POA: Diagnosis not present

## 2015-01-31 MED ORDER — HYDROCODONE-ACETAMINOPHEN 10-325 MG PO TABS: 1.0000 | ORAL_TABLET | ORAL | Status: DC | PRN
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
## 2015-01-31 NOTE — Progress Notes (Signed)
Patient ID: Kathleen Palmer, female   DOB: 05/01/1960, 54 y.o.   MRN: [id number]   Subjective:    Patient ID: Kathleen Palmer, female    DOB: 21-Oct-1960, 54 y.o.   MRN: [id number]  Patient presents for Medication Review/ Refill; Mental Health Issues; and Hard Knot to L Inner Wrist  patient here with a knot on her left inner wrist that came up over the past few weeks. It is tender to touch. She does not remember any particular trauma to the area. She has had small lipomas removed from other places on her body.  She really found out that her sister also has had problems with her memory she had an MRI done and she has some abnormal spots on her brain but she does not know the specifics. Her sister is her twin. She states that she never told her doctors but she has had memory problems difficulty concentrating for the past 10 years she also has ringing in her ears constantly which has been getting worse over the past 8 years. She initially thought it was all due to her depression but she is concerned that she may have dementia or something else going on with her sister's brain scan being abnormal. She is adopted but knows that she has a strong family history of mental illness her mother committed suicide when she was 28 g other also died in a mental hospital.  He continues to suffer with severe depression with psychoses and is followed by psychiatry.    Review Of Systems:  GEN- + fatigue, fever, weight loss,weakness, recent illness HEENT- denies eye drainage, change in vision, nasal discharge, CVS- denies chest pain, palpitations RESP- denies SOB, cough, wheeze ABD- denies N/V, change in stools, abd pain GU- denies dysuria, hematuria, dribbling, incontinence MSK- + joint pain, muscle aches, injury Neuro- denies headache, dizziness, syncope, seizure activity       Objective:    BP 128/70 mmHg  Pulse 72  Temp(Src) 98.4 F (36.9 C) (Oral)  Resp 14  Ht 5\' 5"  (1.651 m)  Wt 172 lb (78.019 kg)  BMI  28.62 kg/m2 GEN- NAD, alert and oriented x3 HEENT- PERRL, EOMI, non injected sclera, pink conjunctiva, MMM, oropharynx clear, TM Clear bilat, no effusion, decreased whisper bilat CVS- RRR, no murmur RESP-CTAB Neuro-CNII-XII in tact MMSE 27/30 , no focal deficits  Psych-depressed affect, tearful at times, no active SI, well groomed  MSK- left wrist- small cystic swelling adjancet to wrist flexors, TTP, no erythema Pulses- Radial - 2+  Failed hearing screen      Assessment & Plan:      Problem List Items Addressed This Visit    None    Visit Diagnoses    Ganglion cyst of wrist, left    -  Primary    Referral to ortho for removal    Memory problem        This is very difficult as her symptoms has been present for many years along with her severe mental illness. Depression in general can cause difficulty with memory and concentrating With her sister recent abnormal MRI, will obtain MRI of brain    Tinnitus, bilateral        with hearing loss, MRI of brain to be done, refer to ENT for further evaluation    Family history of disorder of brain        unknown disorder that her twin sister has       Note: This dictation was prepared with  Dragon dictation along with smaller Company secretary. Any transcriptional errors that result from this process are unintentional.

## 2015-01-31 NOTE — Patient Instructions (Signed)
Discussed orally 

## 2015-01-31 NOTE — Patient Instructions (Signed)
MRI of brain to be done Continue current meds Referral for cyst removal F/U as previous

## 2015-02-07 ENCOUNTER — Ambulatory Visit (INDEPENDENT_AMBULATORY_CARE_PROVIDER_SITE_OTHER): Payer: Medicare Other | Admitting: Psychiatry

## 2015-02-07 ENCOUNTER — Encounter (HOSPITAL_COMMUNITY): Payer: Self-pay | Admitting: Psychiatry

## 2015-02-07 VITALS — BP 130/75 | HR 64 | Ht 65.0 in | Wt 174.0 lb

## 2015-02-07 DIAGNOSIS — F329 Major depressive disorder, single episode, unspecified: Secondary | ICD-10-CM | POA: Diagnosis not present

## 2015-02-07 DIAGNOSIS — F32A Depression, unspecified: Secondary | ICD-10-CM

## 2015-02-07 MED ORDER — FLUOXETINE HCL 20 MG PO CAPS: 80.0000 mg | ORAL_CAPSULE | Freq: Every day | ORAL | Status: DC

## 2015-02-07 MED ORDER — ALPRAZOLAM 1 MG PO TABS: 1.0000 mg | ORAL_TABLET | Freq: Four times a day (QID) | ORAL | Status: DC

## 2015-02-07 MED ORDER — TRAZODONE HCL 100 MG PO TABS: ORAL_TABLET | ORAL | Status: DC
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
## 2015-02-13 ENCOUNTER — Ambulatory Visit: Payer: Medicare Other | Admitting: Orthopedic Surgery

## 2015-02-21 ENCOUNTER — Ambulatory Visit (HOSPITAL_COMMUNITY): Payer: 59

## 2015-02-23 ENCOUNTER — Ambulatory Visit (HOSPITAL_COMMUNITY): Payer: Self-pay | Admitting: Psychiatry

## 2015-03-06 ENCOUNTER — Telehealth: Payer: Self-pay | Admitting: Family Medicine

## 2015-03-06 MED ORDER — HYDROCODONE-ACETAMINOPHEN 10-325 MG PO TABS: 1.0000 | ORAL_TABLET | ORAL | Status: DC | PRN

## 2015-03-06 NOTE — Telephone Encounter (Signed)
Ok to refill??  Last office visit/ refill 01/31/2015.

## 2015-03-06 NOTE — Telephone Encounter (Signed)
okay

## 2015-03-06 NOTE — Telephone Encounter (Signed)
Prescription printed and patient made aware to come to office to pick up after 2pm on 03/06/2015.

## 2015-03-06 NOTE — Telephone Encounter (Signed)
Patient calling to get rx for her hydrocodone  [phone]

## 2015-03-07 ENCOUNTER — Emergency Department (HOSPITAL_COMMUNITY): Payer: Medicare Other

## 2015-03-07 ENCOUNTER — Encounter (HOSPITAL_COMMUNITY): Payer: Self-pay | Admitting: Emergency Medicine

## 2015-03-07 ENCOUNTER — Emergency Department (HOSPITAL_COMMUNITY)
Admission: EM | Admit: 2015-03-07 | Discharge: 2015-03-07 | Disposition: A | Discharge status: Home / Self Care | Payer: Medicare Other | Attending: Emergency Medicine | Admitting: Emergency Medicine

## 2015-03-07 DIAGNOSIS — F329 Major depressive disorder, single episode, unspecified: Secondary | ICD-10-CM | POA: Diagnosis not present

## 2015-03-07 DIAGNOSIS — Z8619 Personal history of other infectious and parasitic diseases: Secondary | ICD-10-CM | POA: Diagnosis not present

## 2015-03-07 DIAGNOSIS — Z931 Gastrostomy status: Secondary | ICD-10-CM | POA: Diagnosis not present

## 2015-03-07 DIAGNOSIS — R109 Unspecified abdominal pain: Secondary | ICD-10-CM | POA: Insufficient documentation

## 2015-03-07 DIAGNOSIS — Z87448 Personal history of other diseases of urinary system: Secondary | ICD-10-CM | POA: Insufficient documentation

## 2015-03-07 DIAGNOSIS — G8929 Other chronic pain: Secondary | ICD-10-CM | POA: Insufficient documentation

## 2015-03-07 DIAGNOSIS — Z87891 Personal history of nicotine dependence: Secondary | ICD-10-CM | POA: Insufficient documentation

## 2015-03-07 DIAGNOSIS — Z8639 Personal history of other endocrine, nutritional and metabolic disease: Secondary | ICD-10-CM | POA: Insufficient documentation

## 2015-03-07 DIAGNOSIS — Z9049 Acquired absence of other specified parts of digestive tract: Secondary | ICD-10-CM | POA: Diagnosis not present

## 2015-03-07 DIAGNOSIS — M1990 Unspecified osteoarthritis, unspecified site: Secondary | ICD-10-CM | POA: Insufficient documentation

## 2015-03-07 DIAGNOSIS — F410 Panic disorder [episodic paroxysmal anxiety] without agoraphobia: Secondary | ICD-10-CM | POA: Diagnosis not present

## 2015-03-07 DIAGNOSIS — Z79899 Other long term (current) drug therapy: Secondary | ICD-10-CM | POA: Insufficient documentation

## 2015-03-07 DIAGNOSIS — R10819 Abdominal tenderness, unspecified site: Secondary | ICD-10-CM

## 2015-03-07 DIAGNOSIS — Z90710 Acquired absence of both cervix and uterus: Secondary | ICD-10-CM | POA: Insufficient documentation

## 2015-03-07 LAB — CBC WITH DIFFERENTIAL/PLATELET
Basophils Absolute: 0 10*3/uL (ref 0.0–0.1)
Basophils Relative: 0 %
Eosinophils Absolute: 0.1 10*3/uL (ref 0.0–0.7)
Eosinophils Relative: 1 %
HEMATOCRIT: 40.3 % (ref 36.0–46.0)
Hemoglobin: 13.4 g/dL (ref 12.0–15.0)
LYMPHS PCT: 29 %
Lymphs Abs: 1.4 10*3/uL (ref 0.7–4.0)
MCH: 30.2 pg (ref 26.0–34.0)
MCHC: 33.3 g/dL (ref 30.0–36.0)
MCV: 90.8 fL (ref 78.0–100.0)
MONO ABS: 0.3 10*3/uL (ref 0.1–1.0)
MONOS PCT: 7 %
NEUTROS ABS: 3.1 10*3/uL (ref 1.7–7.7)
Neutrophils Relative %: 63 %
Platelets: 169 10*3/uL (ref 150–400)
RBC: 4.44 MIL/uL (ref 3.87–5.11)
RDW: 12.6 % (ref 11.5–15.5)
WBC: 5 10*3/uL (ref 4.0–10.5)

## 2015-03-07 LAB — URINALYSIS, ROUTINE W REFLEX MICROSCOPIC
BILIRUBIN URINE: NEGATIVE
GLUCOSE, UA: NEGATIVE mg/dL
HGB URINE DIPSTICK: NEGATIVE
Ketones, ur: NEGATIVE mg/dL
Leukocytes, UA: NEGATIVE
Nitrite: NEGATIVE
Protein, ur: NEGATIVE mg/dL
SPECIFIC GRAVITY, URINE: 1.02 (ref 1.005–1.030)
Urobilinogen, UA: 0.2 mg/dL (ref 0.0–1.0)
pH: 7.5 (ref 5.0–8.0)

## 2015-03-07 LAB — COMPREHENSIVE METABOLIC PANEL
ALT: 39 U/L (ref 14–54)
ANION GAP: 7 (ref 5–15)
AST: 35 U/L (ref 15–41)
Albumin: 4 g/dL (ref 3.5–5.0)
Alkaline Phosphatase: 49 U/L (ref 38–126)
BILIRUBIN TOTAL: 0.3 mg/dL (ref 0.3–1.2)
BUN: 14 mg/dL (ref 6–20)
CO2: 28 mmol/L (ref 22–32)
Calcium: 9.3 mg/dL (ref 8.9–10.3)
Chloride: 104 mmol/L (ref 101–111)
Creatinine, Ser: 0.83 mg/dL (ref 0.44–1.00)
Glucose, Bld: 103 mg/dL — ABNORMAL HIGH (ref 65–99)
POTASSIUM: 4 mmol/L (ref 3.5–5.1)
Sodium: 139 mmol/L (ref 135–145)
TOTAL PROTEIN: 6.8 g/dL (ref 6.5–8.1)

## 2015-03-07 MED ORDER — TRAMADOL HCL 50 MG PO TABS: 50.0000 mg | ORAL_TABLET | Freq: Four times a day (QID) | ORAL | Status: DC | PRN

## 2015-03-07 MED ORDER — ONDANSETRON HCL 4 MG/2ML IJ SOLN
4.0000 mg | Freq: Once | INTRAMUSCULAR | Status: AC
  Administered 2015-03-07: 4 mg via INTRAVENOUS
  Filled 2015-03-07: qty 2

## 2015-03-07 MED ORDER — IOHEXOL 300 MG/ML  SOLN
100.0000 mL | Freq: Once | INTRAMUSCULAR | Status: AC | PRN
  Administered 2015-03-07: 100 mL via INTRAVENOUS

## 2015-03-07 MED ORDER — IOHEXOL 300 MG/ML  SOLN
50.0000 mL | Freq: Once | INTRAMUSCULAR | Status: AC | PRN
  Administered 2015-03-07: 25 mL via ORAL

## 2015-03-07 MED ORDER — FENTANYL CITRATE (PF) 100 MCG/2ML IJ SOLN
50.0000 ug | Freq: Once | INTRAMUSCULAR | Status: AC
  Administered 2015-03-07: 50 ug via INTRAVENOUS
  Filled 2015-03-07: qty 2

## 2015-03-07 NOTE — ED Notes (Signed)
Pt reports right flank pain and foul swelling urine since yesterday.  Pt has urostomy bag.  Pt states she has been drinking a lot of cranberry juice and water.  Urine is cloudy with small amt white sediment.

## 2015-03-07 NOTE — Discharge Instructions (Signed)
CT scan shows no acute abnormalities.  Follow up with urologist locally.  Rx for pain meds

## 2015-03-07 NOTE — ED Notes (Signed)
Pt ambulated to bathroom 

## 2015-03-07 NOTE — ED Notes (Signed)
Pt made aware to return if symptoms worsen or if any life threatening symptoms occur.   
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
## 2015-03-08 ENCOUNTER — Ambulatory Visit (INDEPENDENT_AMBULATORY_CARE_PROVIDER_SITE_OTHER): Payer: Medicare Other | Admitting: Otolaryngology

## 2015-03-08 DIAGNOSIS — H903 Sensorineural hearing loss, bilateral: Secondary | ICD-10-CM | POA: Diagnosis not present

## 2015-03-08 DIAGNOSIS — H9313 Tinnitus, bilateral: Secondary | ICD-10-CM | POA: Diagnosis not present

## 2015-03-09 ENCOUNTER — Ambulatory Visit (HOSPITAL_COMMUNITY)
Admission: RE | Admit: 2015-03-09 | Discharge: 2015-03-09 | Disposition: A | Discharge status: Home / Self Care | Payer: Medicare Other | Source: Ambulatory Visit | Attending: Family Medicine | Admitting: Family Medicine

## 2015-03-09 ENCOUNTER — Ambulatory Visit (HOSPITAL_COMMUNITY): Payer: Medicare Other

## 2015-03-09 DIAGNOSIS — R413 Other amnesia: Secondary | ICD-10-CM | POA: Insufficient documentation

## 2015-03-09 DIAGNOSIS — Z820 Family history of epilepsy and other diseases of the nervous system: Secondary | ICD-10-CM

## 2015-03-09 DIAGNOSIS — H9313 Tinnitus, bilateral: Secondary | ICD-10-CM | POA: Diagnosis not present

## 2015-03-09 DIAGNOSIS — H9193 Unspecified hearing loss, bilateral: Secondary | ICD-10-CM | POA: Diagnosis not present

## 2015-03-13 ENCOUNTER — Other Ambulatory Visit: Payer: Self-pay | Admitting: *Deleted

## 2015-03-13 DIAGNOSIS — R413 Other amnesia: Secondary | ICD-10-CM

## 2015-03-13 DIAGNOSIS — IMO0002 Reserved for concepts with insufficient information to code with codable children: Secondary | ICD-10-CM

## 2015-03-26 ENCOUNTER — Encounter (HOSPITAL_COMMUNITY): Payer: Self-pay | Admitting: Psychiatry

## 2015-03-26 ENCOUNTER — Ambulatory Visit (INDEPENDENT_AMBULATORY_CARE_PROVIDER_SITE_OTHER): Payer: Medicare Other | Admitting: Psychiatry

## 2015-03-26 DIAGNOSIS — F32A Depression, unspecified: Secondary | ICD-10-CM

## 2015-03-26 DIAGNOSIS — F329 Major depressive disorder, single episode, unspecified: Secondary | ICD-10-CM

## 2015-03-26 NOTE — Patient Instructions (Signed)
Discussed orally 
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
## 2015-04-04 ENCOUNTER — Telehealth: Payer: Self-pay | Admitting: Family Medicine

## 2015-04-04 MED ORDER — HYDROCODONE-ACETAMINOPHEN 10-325 MG PO TABS: 1.0000 | ORAL_TABLET | ORAL | Status: DC | PRN

## 2015-04-04 NOTE — Telephone Encounter (Signed)
okay

## 2015-04-04 NOTE — Telephone Encounter (Signed)
Ok to refill??  Last office visit 01/31/2015.  Last refill 03/06/2015.

## 2015-04-04 NOTE — Telephone Encounter (Signed)
Prescription printed and patient made aware to come to office to pick up after 2 pm on 04/04/2015.

## 2015-04-04 NOTE — Telephone Encounter (Signed)
Patient is calling to get rx for her hydrocodone  [phone]

## 2015-04-09 ENCOUNTER — Ambulatory Visit (HOSPITAL_COMMUNITY): Payer: Self-pay | Admitting: Psychiatry

## 2015-04-26 ENCOUNTER — Ambulatory Visit (INDEPENDENT_AMBULATORY_CARE_PROVIDER_SITE_OTHER): Payer: Medicare Other | Admitting: Psychiatry

## 2015-04-26 ENCOUNTER — Encounter (HOSPITAL_COMMUNITY): Payer: Self-pay | Admitting: Psychiatry

## 2015-04-26 VITALS — BP 142/60 | HR 73 | Ht 63.0 in | Wt 165.8 lb

## 2015-04-26 DIAGNOSIS — F329 Major depressive disorder, single episode, unspecified: Secondary | ICD-10-CM | POA: Diagnosis not present

## 2015-04-26 DIAGNOSIS — F32A Depression, unspecified: Secondary | ICD-10-CM

## 2015-04-26 DIAGNOSIS — F4310 Post-traumatic stress disorder, unspecified: Secondary | ICD-10-CM | POA: Diagnosis not present

## 2015-04-26 DIAGNOSIS — F1210 Cannabis abuse, uncomplicated: Secondary | ICD-10-CM

## 2015-04-26 MED ORDER — FLUOXETINE HCL 20 MG PO CAPS: 60.0000 mg | ORAL_CAPSULE | Freq: Every day | ORAL | Status: DC

## 2015-04-26 MED ORDER — ALPRAZOLAM 1 MG PO TABS: 1.0000 mg | ORAL_TABLET | Freq: Four times a day (QID) | ORAL | Status: DC

## 2015-04-26 MED ORDER — TRAZODONE HCL 100 MG PO TABS: ORAL_TABLET | ORAL | Status: DC
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
## 2015-05-01 ENCOUNTER — Ambulatory Visit (INDEPENDENT_AMBULATORY_CARE_PROVIDER_SITE_OTHER): Payer: Medicare Other | Admitting: Family Medicine

## 2015-05-01 ENCOUNTER — Emergency Department (HOSPITAL_COMMUNITY)
Admission: EM | Admit: 2015-05-01 | Discharge: 2015-05-01 | Disposition: A | Discharge status: Other Acute Inpatient Hospital | Payer: Medicare Other | Attending: Emergency Medicine | Admitting: Emergency Medicine

## 2015-05-01 ENCOUNTER — Encounter: Payer: Self-pay | Admitting: Family Medicine

## 2015-05-01 ENCOUNTER — Encounter (HOSPITAL_COMMUNITY): Payer: Self-pay | Admitting: Emergency Medicine

## 2015-05-01 VITALS — BP 132/88 | HR 72 | Temp 98.2°F | Resp 18 | Wt 172.0 lb

## 2015-05-01 DIAGNOSIS — Z8639 Personal history of other endocrine, nutritional and metabolic disease: Secondary | ICD-10-CM | POA: Insufficient documentation

## 2015-05-01 DIAGNOSIS — F1310 Sedative, hypnotic or anxiolytic abuse, uncomplicated: Secondary | ICD-10-CM | POA: Diagnosis not present

## 2015-05-01 DIAGNOSIS — R451 Restlessness and agitation: Secondary | ICD-10-CM | POA: Insufficient documentation

## 2015-05-01 DIAGNOSIS — F1210 Cannabis abuse, uncomplicated: Secondary | ICD-10-CM | POA: Insufficient documentation

## 2015-05-01 DIAGNOSIS — G894 Chronic pain syndrome: Secondary | ICD-10-CM

## 2015-05-01 DIAGNOSIS — G478 Other sleep disorders: Secondary | ICD-10-CM | POA: Diagnosis not present

## 2015-05-01 DIAGNOSIS — N390 Urinary tract infection, site not specified: Secondary | ICD-10-CM | POA: Insufficient documentation

## 2015-05-01 DIAGNOSIS — E039 Hypothyroidism, unspecified: Secondary | ICD-10-CM

## 2015-05-01 DIAGNOSIS — F329 Major depressive disorder, single episode, unspecified: Secondary | ICD-10-CM | POA: Insufficient documentation

## 2015-05-01 DIAGNOSIS — F1110 Opioid abuse, uncomplicated: Secondary | ICD-10-CM | POA: Insufficient documentation

## 2015-05-01 DIAGNOSIS — M1990 Unspecified osteoarthritis, unspecified site: Secondary | ICD-10-CM | POA: Insufficient documentation

## 2015-05-01 DIAGNOSIS — E038 Other specified hypothyroidism: Secondary | ICD-10-CM | POA: Diagnosis not present

## 2015-05-01 DIAGNOSIS — F410 Panic disorder [episodic paroxysmal anxiety] without agoraphobia: Secondary | ICD-10-CM | POA: Diagnosis not present

## 2015-05-01 DIAGNOSIS — Z8619 Personal history of other infectious and parasitic diseases: Secondary | ICD-10-CM | POA: Insufficient documentation

## 2015-05-01 DIAGNOSIS — Z79899 Other long term (current) drug therapy: Secondary | ICD-10-CM | POA: Diagnosis not present

## 2015-05-01 DIAGNOSIS — F333 Major depressive disorder, recurrent, severe with psychotic symptoms: Secondary | ICD-10-CM | POA: Diagnosis not present

## 2015-05-01 DIAGNOSIS — G8929 Other chronic pain: Secondary | ICD-10-CM | POA: Diagnosis not present

## 2015-05-01 DIAGNOSIS — Z87891 Personal history of nicotine dependence: Secondary | ICD-10-CM | POA: Diagnosis not present

## 2015-05-01 DIAGNOSIS — Z008 Encounter for other general examination: Secondary | ICD-10-CM | POA: Diagnosis present

## 2015-05-01 DIAGNOSIS — B182 Chronic viral hepatitis C: Secondary | ICD-10-CM

## 2015-05-01 DIAGNOSIS — R45851 Suicidal ideations: Secondary | ICD-10-CM | POA: Insufficient documentation

## 2015-05-01 LAB — CBC
HCT: 41.7 % (ref 36.0–46.0)
Hemoglobin: 13.7 g/dL (ref 12.0–15.0)
MCH: 30.2 pg (ref 26.0–34.0)
MCHC: 32.9 g/dL (ref 30.0–36.0)
MCV: 91.9 fL (ref 78.0–100.0)
PLATELETS: 150 10*3/uL (ref 150–400)
RBC: 4.54 MIL/uL (ref 3.87–5.11)
RDW: 13.2 % (ref 11.5–15.5)
WBC: 4.9 10*3/uL (ref 4.0–10.5)

## 2015-05-01 LAB — BASIC METABOLIC PANEL
ANION GAP: 8 (ref 5–15)
BUN: 12 mg/dL (ref 6–20)
CALCIUM: 9.1 mg/dL (ref 8.9–10.3)
CO2: 28 mmol/L (ref 22–32)
Chloride: 106 mmol/L (ref 101–111)
Creatinine, Ser: 0.78 mg/dL (ref 0.44–1.00)
Glucose, Bld: 103 mg/dL — ABNORMAL HIGH (ref 65–99)
POTASSIUM: 3.5 mmol/L (ref 3.5–5.1)
SODIUM: 142 mmol/L (ref 135–145)

## 2015-05-01 LAB — COMPREHENSIVE METABOLIC PANEL
ALBUMIN: 4.2 g/dL (ref 3.6–5.1)
ALT: 110 U/L — ABNORMAL HIGH (ref 6–29)
AST: 76 U/L — ABNORMAL HIGH (ref 10–35)
Alkaline Phosphatase: 55 U/L (ref 33–130)
BUN: 13 mg/dL (ref 7–25)
CHLORIDE: 105 mmol/L (ref 98–110)
CO2: 27 mmol/L (ref 20–31)
CREATININE: 0.84 mg/dL (ref 0.50–1.05)
Calcium: 8.6 mg/dL (ref 8.6–10.4)
Glucose, Bld: 67 mg/dL — ABNORMAL LOW (ref 70–99)
POTASSIUM: 4.2 mmol/L (ref 3.5–5.3)
SODIUM: 140 mmol/L (ref 135–146)
TOTAL PROTEIN: 6.5 g/dL (ref 6.1–8.1)
Total Bilirubin: 0.4 mg/dL (ref 0.2–1.2)

## 2015-05-01 LAB — URINE MICROSCOPIC-ADD ON

## 2015-05-01 LAB — URINALYSIS, ROUTINE W REFLEX MICROSCOPIC
Bilirubin Urine: NEGATIVE
GLUCOSE, UA: NEGATIVE mg/dL
KETONES UR: NEGATIVE mg/dL
LEUKOCYTES UA: NEGATIVE
NITRITE: POSITIVE — AB
PROTEIN: NEGATIVE mg/dL
Specific Gravity, Urine: 1.02 (ref 1.005–1.030)
pH: 6.5 (ref 5.0–8.0)

## 2015-05-01 LAB — CBC WITH DIFFERENTIAL/PLATELET
BASOS PCT: 0 % (ref 0–1)
Basophils Absolute: 0 10*3/uL (ref 0.0–0.1)
EOS ABS: 0 10*3/uL (ref 0.0–0.7)
Eosinophils Relative: 1 % (ref 0–5)
HCT: 41.7 % (ref 36.0–46.0)
HEMOGLOBIN: 13.8 g/dL (ref 12.0–15.0)
LYMPHS ABS: 0.7 10*3/uL (ref 0.7–4.0)
Lymphocytes Relative: 18 % (ref 12–46)
MCH: 30 pg (ref 26.0–34.0)
MCHC: 33.1 g/dL (ref 30.0–36.0)
MCV: 90.7 fL (ref 78.0–100.0)
MONO ABS: 0.5 10*3/uL (ref 0.1–1.0)
MONOS PCT: 12 % (ref 3–12)
MPV: 11.1 fL (ref 8.6–12.4)
Neutro Abs: 2.7 10*3/uL (ref 1.7–7.7)
Neutrophils Relative %: 69 % (ref 43–77)
PLATELETS: 162 10*3/uL (ref 150–400)
RBC: 4.6 MIL/uL (ref 3.87–5.11)
RDW: 14.4 % (ref 11.5–15.5)
WBC: 3.9 10*3/uL — ABNORMAL LOW (ref 4.0–10.5)

## 2015-05-01 LAB — RAPID URINE DRUG SCREEN, HOSP PERFORMED
Amphetamines: POSITIVE — AB
BENZODIAZEPINES: POSITIVE — AB
Barbiturates: NOT DETECTED
COCAINE: NOT DETECTED
OPIATES: POSITIVE — AB
Tetrahydrocannabinol: POSITIVE — AB

## 2015-05-01 LAB — ETHANOL

## 2015-05-01 LAB — T4, FREE: Free T4: 1.02 ng/dL (ref 0.80–1.80)

## 2015-05-01 LAB — TSH: TSH: 4.843 u[IU]/mL — AB (ref 0.350–4.500)

## 2015-05-01 LAB — T3, FREE: T3 FREE: 3 pg/mL (ref 2.3–4.2)

## 2015-05-01 MED ORDER — HYDROCODONE-ACETAMINOPHEN 10-325 MG PO TABS: 1.0000 | ORAL_TABLET | ORAL | Status: DC | PRN

## 2015-05-01 MED ORDER — ALPRAZOLAM 0.5 MG PO TABS
1.0000 mg | ORAL_TABLET | Freq: Four times a day (QID) | ORAL | Status: DC
  Administered 2015-05-01: 1 mg via ORAL
  Filled 2015-05-01: qty 2

## 2015-05-01 MED ORDER — CEPHALEXIN 500 MG PO CAPS
500.0000 mg | ORAL_CAPSULE | Freq: Four times a day (QID) | ORAL | Status: DC
  Administered 2015-05-01: 500 mg via ORAL
  Filled 2015-05-01: qty 1

## 2015-05-01 MED ORDER — HYDROCODONE-ACETAMINOPHEN 10-325 MG PO TABS
1.0000 | ORAL_TABLET | ORAL | Status: DC | PRN
  Administered 2015-05-01: 1 via ORAL
  Filled 2015-05-01: qty 1

## 2015-05-01 MED ORDER — TRAZODONE HCL 50 MG PO TABS: 100.0000 mg | ORAL_TABLET | Freq: Every day | ORAL | Status: DC

## 2015-05-01 MED ORDER — FLUOXETINE HCL 20 MG PO CAPS
60.0000 mg | ORAL_CAPSULE | Freq: Every day | ORAL | Status: DC
  Filled 2015-05-01: qty 3
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
## 2015-05-01 NOTE — Assessment & Plan Note (Signed)
Recheck TFT 

## 2015-05-01 NOTE — Assessment & Plan Note (Signed)
Untreated Hep C, now states she wants treatment, no HSM or lesions seen on recent CT scan

## 2015-05-01 NOTE — Patient Instructions (Addendum)
Referral back to neurology Referral to GI for hepatitis C  F/U 4 months

## 2015-05-01 NOTE — Progress Notes (Signed)
Patient meets inpatient criteria, per Elmarie Shiley, NP.  Patient has been referred for the inpatient psych treatment at: [facility] - per intake, fax referral. [facility] - per Jackelyn Poling, fax referral. [facility] - per Jenny Reichmann, 2 female beds open, fax it. [facility] - per Darrick Penna, fax referrals for review. [facility] - per Darci Current, fax referral. [facility] - per intake, fax referral for the waitlist. [facility] - per Sharon Seller, has beds for all. [facility] - per Gerald Stabs, fax referral.  Verlon Setting, [place] Disposition staff 05/01/2015 6:43 PM

## 2015-05-01 NOTE — Progress Notes (Signed)
Patient ID: Kathleen Palmer, female   DOB: 1960-08-22, 55 y.o.   MRN: [id number]   Subjective:    Patient ID: Kathleen Palmer, female    DOB: [birth year], 55 y.o.   MRN: [id number]  Patient presents for 6 mth check up     Pt here for F/U, last visit MRI obtained- showed non specific white matter spots, her Twin has some type of newly diagnosed disease pt unaware of the name. I referred to neurology but she did not go, needs appt rescheduled.   Would like to proceed with treatment for her hepatitis C. States she gets Right side pain like her liver burns. No change with meals. Had CT done 6 weeks ago due to some flank pain, no liver lesions seen. CMET was also normal.    Declines seeing ortho now. Asked for knee brace to be ordered   States her depression is still severe and tends to blame everything on her depression, confusion, not wanting to take care of health, smoking marijauna, being late and missing appointments. Unfortunately every visit starts this way   Review Of Systems:  GEN- denies fatigue, fever, weight loss,weakness, recent illness HEENT- denies eye drainage, change in vision, nasal discharge, CVS- denies chest pain, palpitations RESP- denies SOB, cough, wheeze ABD- denies N/V, change in stools, abd pain GU- denies dysuria, hematuria, dribbling, incontinence MSK- + joint pain, muscle aches, injury Neuro- denies headache, dizziness, syncope, seizure activity       Objective:    BP 132/88 mmHg  Pulse 72  Temp(Src) 98.2 F (36.8 C) (Oral)  Resp 18  Wt 172 lb (78.019 kg) GEN- NAD, alert and oriented x3 HEENT- PERRL, EOMI, non injected sclera, pink conjunctiva, MMM, oropharynx clear, non icteric Neck- Supple, no thyromegaly CVS- RRR, no murmur RESP-CTAB ABD-NABS,soft,NT,no HSM, ND, urostomy in tact  EXT- No edema Pulses- Radial 2+        Assessment & Plan:      Problem List Items Addressed This Visit    None      Note: This dictation was prepared with Dragon  dictation along with smaller phrase technology. Any transcriptional errors that result from this process are unintentional.

## 2015-05-01 NOTE — ED Notes (Signed)
Family member wandered.

## 2015-05-01 NOTE — ED Notes (Signed)
Pt asking about going home.  Advised pt that TTS was recommending inpatient treatment and we could not let her go home.  Pt tearful , but states she is ok with in patient placement  States she prefers to go to [facility].

## 2015-05-01 NOTE — Assessment & Plan Note (Addendum)
Known OA, chronic pelvic pain, with adhesions scar tissue Continue current pain meds Continue to reiterate stopping the marijana Declines ortho, advised I do not order knee braces, can use knee sleeve OTC

## 2015-05-01 NOTE — ED Notes (Signed)
Patient discharged to [facility]

## 2015-05-01 NOTE — Assessment & Plan Note (Signed)
Severe depression with PTSD, Anxiety, discussed that she needs to see her therapist more often, she will often cancel or not go because of depression is "so bad"  Has appt tomorrow

## 2015-05-01 NOTE — ED Notes (Signed)
Spoke with catia at [facility] and pt is accepted at [facility], but needs to be made IVC, EdP aware.

## 2015-05-01 NOTE — ED Notes (Signed)
Patient resting in bed at this time. Calm and cooperative. Provided patient with drink and crackers at request. No other needs voiced at this time.

## 2015-05-01 NOTE — ED Provider Notes (Signed)
Patient accepted to [facility] by Dr. Alvina Filbert.  Ezequiel Essex, MD 05/01/15 2043

## 2015-05-01 NOTE — Progress Notes (Signed)
Patient has been accepted at [facility] and at [facility] Per Dr. Wyvonnia Dusky, pt is going to [facility].  Intake at [facility] was informed that pt not coming.  Verlon Setting, [place] Disposition staff 05/01/2015 9:20 PM

## 2015-05-01 NOTE — ED Notes (Signed)
Pt has been tearful at times, stating she does not want to harm herself and wants to go home. Pt states she is on pain medication for chronic pain as well. Spoke with ED provider and received order for same home med for pain. Pt has remained cooperative.

## 2015-05-01 NOTE — ED Notes (Signed)
TTS called and recommended inpatient treatment.   Looking for placement.  Nothing available at [facility].

## 2015-05-01 NOTE — ED Provider Notes (Signed)
CSN: [id number]     Arrival date & time 05/01/15  1451 History   First MD Initiated Contact with Patient 05/01/15 1504     Chief Complaint  Patient presents with  . V70.1     (Consider location/radiation/quality/duration/timing/severity/associated sxs/prior Treatment) HPI Comments:  Patient with history of depression, states worsening symptoms with intermittent thoughts of suicide. No plan at this time. No homicidal thoughts. No hallucinations. States compliance with her Prozac and trazodone. Saw her psychiatrist 4 days ago. She saw her PCP today. She is worried that she is going to hurt herself. She uses marijuana occasionally but no other drugs or alcohol. States compliance with her medications. Complicated medical history including urostomy. good By mouth intake and urine output. No fever. No vomiting.  The history is provided by the patient and the spouse.    Past Medical History  Diagnosis Date  . Attention to urostomy [facility])   . Anxiety   . Depression   . PTSD (post-traumatic stress disorder)   . Substance abuse     Remote history- cocaine, ETOH, Marijuana  . Hepatitis C     ? Contracted through IVDA  . Thyroid disease   . Chronic pain   . Arthritis   . Angiomyolipoma     Left kidney  . Panic 12/12/1967  . Panic 04/29/1999  . Hepatitis C    Past Surgical History  Procedure Laterality Date  . Abdominal surgery    . Abdominal hysterectomy    . Cholecystectomy    . Vagina reconstruction surgery    . Hernia repair    . Ileo loop conduit    . 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 Adopted: Yes  . Depression Mother   . Depression Sister   . Anxiety disorder Sister   . Bipolar disorder Sister   . Depression Maternal Grandmother   . ADD / ADHD Neg Hx   . Alcohol abuse Neg Hx   . Drug abuse Neg Hx   .  Dementia Neg Hx   . OCD Neg Hx   . Paranoid behavior Neg Hx   . Schizophrenia Neg Hx   . Seizures Neg Hx   . Sexual abuse Neg Hx   . Physical abuse Neg Hx   . Suicidality Neg Hx   . Colon cancer Neg Hx    Social History  Substance Use Topics  . Smoking status: Former Smoker -- 0.25 packs/day for 33 years    Types: Cigarettes    Quit date: 01/21/2015  . Smokeless tobacco: Never Used     Comment: 9-10 cigs a day as of 10/20/2012, (02-07-15 per pt, she stopped smoking 01-21-15)  . Alcohol Use: No     Comment: No etoh since 2004   OB History    No data available     Review of Systems  Constitutional: Positive for activity change and appetite change. Negative for fever and fatigue.  Respiratory: Negative for cough, chest tightness and shortness of breath.   Cardiovascular: Negative for chest pain.  Gastrointestinal: Negative for nausea, vomiting and abdominal pain.  Genitourinary: Negative for dysuria, hematuria, vaginal bleeding and vaginal discharge.  Musculoskeletal: Negative for myalgias and arthralgias.  Skin: Negative for rash.  Psychiatric/Behavioral: Positive for depression, suicidal ideas, sleep disturbance, self-injury, decreased concentration and agitation. The patient is  nervous/anxious.   A complete 10 system review of systems was obtained and all systems are negative except as noted in the HPI and PMH.      Allergies  Oxycodone; Abilify; Ampicillin; Bactrim; and Gabapentin  Home Medications   Prior to Admission medications   Medication Sig Start Date End Date Taking? Authorizing Provider  ALPRAZolam Duanne Moron) 1 MG tablet Take 1 tablet (1 mg total) by mouth 4 (four) times daily. 04/26/15 04/25/16  Cloria Spring, MD  aspirin-acetaminophen-caffeine (EXCEDRIN MIGRAINE) [phone] MG tablet Take 2 tablets by mouth every 6 (six) hours as needed for headache.    Historical Provider, MD  Cyanocobalamin (VITAMIN B-12 PO) Take 1 tablet by mouth daily.    Historical  Provider, MD  ESTRACE VAGINAL 0.1 MG/GM vaginal cream Place vaginally 3 (three) times a week.  04/01/12   Historical Provider, MD  FLUoxetine (PROZAC) 20 MG capsule Take 3 capsules (60 mg total) by mouth daily. 04/26/15   Cloria Spring, MD  HYDROcodone-acetaminophen (NORCO) 10-325 MG tablet Take 1 tablet by mouth every 5 (five) hours as needed for moderate pain. 05/01/15   Alycia Rossetti, MD  Multiple Vitamin (MULTIVITAMIN WITH MINERALS) TABS tablet Take 1 tablet by mouth daily.    Historical Provider, MD  Naphazoline HCl (CLEAR EYES OP) Apply 2 drops to eye daily as needed (dry eyes).    Historical Provider, MD  Pseudoeph-Doxylamine-DM-APAP (NYQUIL PO) Take 30 mLs by mouth daily as needed (congestion/cold).    Historical Provider, MD  traZODone (DESYREL) 100 MG tablet Take two tablets at bedtime 04/26/15   Cloria Spring, MD  UNABLE TO FIND Ostomy Night Drainage Collector  Ref # [id number].  Use as directed. 12/12/14   Alycia Rossetti, MD  vitamin C (ASCORBIC ACID) 500 MG tablet Take 500 mg by mouth daily.    Historical Provider, MD   BP 116/50 mmHg  Pulse 66  Temp(Src) 98.1 F (36.7 C) (Oral)  Resp 16  Ht 5\' 3"  (1.6 m)  Wt 172 lb (78.019 kg)  BMI 30.48 kg/m2  SpO2 99% Physical Exam  Constitutional: She is oriented to person, place, and time. She appears well-developed and well-nourished. No distress.  Tearful, anxious  HENT:  Head: Normocephalic and atraumatic.  Mouth/Throat: Oropharynx is clear and moist. No oropharyngeal exudate.  Eyes: Conjunctivae and EOM are normal. Pupils are equal, round, and reactive to light.  Neck: Normal range of motion. Neck supple.  No meningismus.  Cardiovascular: Normal rate, regular rhythm, normal heart sounds and intact distal pulses.   No murmur heard. Pulmonary/Chest: Effort normal and breath sounds normal. No respiratory distress.  Abdominal: Soft. There is no tenderness. There is no rebound and no guarding.  Urostomy in place  Musculoskeletal:  Normal range of motion. She exhibits no edema or tenderness.  Neurological: She is alert and oriented to person, place, and time. No cranial nerve deficit. She exhibits normal muscle tone. Coordination normal.  No ataxia on finger to nose bilaterally. No pronator drift. 5/5 strength throughout. CN 2-12 intact.Equal grip strength. Sensation intact.   Skin: Skin is warm.  Psychiatric: She has a normal mood and affect. Her behavior is normal.  Nursing note and vitals reviewed.   ED Course  Procedures (including critical care time) Labs Review Labs Reviewed  BASIC METABOLIC PANEL - Abnormal; Notable for the following:    Glucose, Bld 103 (*)    All other components within normal limits  URINALYSIS, ROUTINE W REFLEX MICROSCOPIC (NOT AT [facility]) - Abnormal;  Notable for the following:    Hgb urine dipstick TRACE (*)    Nitrite POSITIVE (*)    All other components within normal limits  URINE RAPID DRUG SCREEN, HOSP PERFORMED - Abnormal; Notable for the following:    Opiates POSITIVE (*)    Benzodiazepines POSITIVE (*)    Amphetamines POSITIVE (*)    Tetrahydrocannabinol POSITIVE (*)    All other components within normal limits  URINE MICROSCOPIC-ADD ON - Abnormal; Notable for the following:    Squamous Epithelial / LPF 0-5 (*)    Bacteria, UA MANY (*)    All other components within normal limits  URINE CULTURE  ETHANOL  CBC    Imaging Review No results found. I have personally reviewed and evaluated these images and lab results as part of my medical decision-making.   EKG Interpretation None      MDM   Final diagnoses:  Suicidal ideation    worsening suicidal thoughts with depression.   labs appear to be at baseline. Urinalysis with many bacteria and positive nitrite though this is a urostomy sample. We'll send culture. Patient with no fever.   Patient meets inpatient criteria, holding orders placed.   Ezequiel Essex, MD 05/01/15 1723

## 2015-05-01 NOTE — ED Notes (Signed)
Pt changed into paper scrubs and urine collected and sent to lab

## 2015-05-01 NOTE — ED Notes (Signed)
History of SI thoughts but no attempts.  Pt says she has a family history of psych issues.

## 2015-05-01 NOTE — ED Notes (Signed)
Kathleen Palmer at [facility] for patient update

## 2015-05-02 ENCOUNTER — Ambulatory Visit (HOSPITAL_COMMUNITY): Payer: Self-pay | Admitting: Psychiatry

## 2015-05-02 ENCOUNTER — Telehealth (HOSPITAL_COMMUNITY): Payer: Self-pay | Admitting: *Deleted

## 2015-05-02 DIAGNOSIS — F4312 Post-traumatic stress disorder, chronic: Secondary | ICD-10-CM | POA: Diagnosis not present

## 2015-05-02 DIAGNOSIS — R45851 Suicidal ideations: Secondary | ICD-10-CM | POA: Diagnosis not present

## 2015-05-02 DIAGNOSIS — F332 Major depressive disorder, recurrent severe without psychotic features: Secondary | ICD-10-CM | POA: Diagnosis not present

## 2015-05-02 DIAGNOSIS — N390 Urinary tract infection, site not specified: Secondary | ICD-10-CM | POA: Diagnosis not present

## 2015-05-02 LAB — HEPATITIS C ANTIBODY: HCV Ab: REACTIVE — AB

## 2015-05-02 LAB — URINE CULTURE

## 2015-05-02 NOTE — Telephone Encounter (Signed)
phone call from patient, she is inpatient at [facility].

## 2015-05-02 NOTE — Telephone Encounter (Signed)
noted 

## 2015-05-03 DIAGNOSIS — F4312 Post-traumatic stress disorder, chronic: Secondary | ICD-10-CM | POA: Diagnosis not present

## 2015-05-03 DIAGNOSIS — R45851 Suicidal ideations: Secondary | ICD-10-CM | POA: Diagnosis not present

## 2015-05-03 DIAGNOSIS — N390 Urinary tract infection, site not specified: Secondary | ICD-10-CM | POA: Diagnosis not present

## 2015-05-03 DIAGNOSIS — F332 Major depressive disorder, recurrent severe without psychotic features: Secondary | ICD-10-CM | POA: Diagnosis not present

## 2015-05-03 LAB — HEPATITIS C RNA QUANTITATIVE
HCV Quantitative Log: 6.71 {Log} — ABNORMAL HIGH (ref <1.18)
HCV Quantitative: 5102698 IU/mL — ABNORMAL HIGH (ref <15)

## 2015-05-04 DIAGNOSIS — N390 Urinary tract infection, site not specified: Secondary | ICD-10-CM | POA: Diagnosis not present

## 2015-05-04 DIAGNOSIS — F332 Major depressive disorder, recurrent severe without psychotic features: Secondary | ICD-10-CM | POA: Diagnosis not present

## 2015-05-04 DIAGNOSIS — R45851 Suicidal ideations: Secondary | ICD-10-CM | POA: Diagnosis not present

## 2015-05-04 DIAGNOSIS — F4312 Post-traumatic stress disorder, chronic: Secondary | ICD-10-CM | POA: Diagnosis not present

## 2015-05-07 ENCOUNTER — Other Ambulatory Visit: Payer: Self-pay | Admitting: Family Medicine

## 2015-05-07 ENCOUNTER — Encounter: Payer: Self-pay | Admitting: Gastroenterology

## 2015-05-07 DIAGNOSIS — R7989 Other specified abnormal findings of blood chemistry: Secondary | ICD-10-CM

## 2015-05-07 DIAGNOSIS — R945 Abnormal results of liver function studies: Principal | ICD-10-CM

## 2015-05-07 DIAGNOSIS — B1920 Unspecified viral hepatitis C without hepatic coma: Secondary | ICD-10-CM

## 2015-05-08 ENCOUNTER — Encounter: Payer: Self-pay | Admitting: *Deleted

## 2015-05-08 ENCOUNTER — Ambulatory Visit: Payer: Medicare Other | Admitting: Orthopedic Surgery

## 2015-05-11 ENCOUNTER — Telehealth (HOSPITAL_COMMUNITY): Payer: Self-pay | Admitting: *Deleted

## 2015-05-16 ENCOUNTER — Other Ambulatory Visit (HOSPITAL_COMMUNITY): Payer: Self-pay | Admitting: Psychiatry

## 2015-05-16 ENCOUNTER — Telehealth (HOSPITAL_COMMUNITY): Payer: Self-pay | Admitting: *Deleted

## 2015-05-16 MED ORDER — TRAZODONE HCL 100 MG PO TABS: ORAL_TABLET | ORAL | Status: DC

## 2015-05-16 NOTE — Telephone Encounter (Signed)
sent 

## 2015-05-16 NOTE — Telephone Encounter (Signed)
Pt pharmacy requesting 90 days supply for pt Trazodone 100 mg 2 tablets at Bedtime due to it being cheaper for pt with her insurance. Pt pharmacy number is [phone].

## 2015-05-17 ENCOUNTER — Ambulatory Visit (INDEPENDENT_AMBULATORY_CARE_PROVIDER_SITE_OTHER): Payer: Medicare Other | Admitting: Psychiatry

## 2015-05-17 ENCOUNTER — Encounter (HOSPITAL_COMMUNITY): Payer: Self-pay | Admitting: Psychiatry

## 2015-05-17 DIAGNOSIS — F332 Major depressive disorder, recurrent severe without psychotic features: Secondary | ICD-10-CM | POA: Diagnosis not present

## 2015-05-17 NOTE — Patient Instructions (Signed)
Discussed orally 
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
## 2015-05-17 NOTE — Telephone Encounter (Signed)
noted 

## 2015-05-24 ENCOUNTER — Ambulatory Visit (INDEPENDENT_AMBULATORY_CARE_PROVIDER_SITE_OTHER): Payer: Medicare Other | Admitting: Psychiatry

## 2015-05-24 DIAGNOSIS — F332 Major depressive disorder, recurrent severe without psychotic features: Secondary | ICD-10-CM

## 2015-05-24 NOTE — Patient Instructions (Signed)
Discussed orally 
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
## 2015-05-29 ENCOUNTER — Ambulatory Visit: Payer: Self-pay | Admitting: Gastroenterology

## 2015-05-30 ENCOUNTER — Encounter (HOSPITAL_COMMUNITY): Payer: Self-pay | Admitting: *Deleted

## 2015-05-30 ENCOUNTER — Ambulatory Visit (HOSPITAL_COMMUNITY): Payer: Self-pay | Admitting: Psychiatry

## 2015-05-30 ENCOUNTER — Encounter (HOSPITAL_COMMUNITY): Payer: Self-pay | Admitting: Psychiatry

## 2015-06-05 ENCOUNTER — Ambulatory Visit: Payer: Self-pay | Admitting: Gastroenterology

## 2015-06-06 ENCOUNTER — Telehealth: Payer: Self-pay | Admitting: Family Medicine

## 2015-06-06 MED ORDER — HYDROCODONE-ACETAMINOPHEN 10-325 MG PO TABS: 1.0000 | ORAL_TABLET | ORAL | Status: DC | PRN

## 2015-06-06 NOTE — Telephone Encounter (Signed)
Pt needs a refill of Hydrocodone 10-325  Ph: [phone]

## 2015-06-06 NOTE — Telephone Encounter (Signed)
okay

## 2015-06-06 NOTE — Telephone Encounter (Signed)
?   OK to Refill - LOV 05/01/15 Last refill 05/01/15

## 2015-06-07 ENCOUNTER — Encounter (HOSPITAL_COMMUNITY): Payer: Self-pay | Admitting: Psychiatry

## 2015-06-07 ENCOUNTER — Ambulatory Visit (INDEPENDENT_AMBULATORY_CARE_PROVIDER_SITE_OTHER): Payer: Medicare Other | Admitting: Psychiatry

## 2015-06-07 DIAGNOSIS — F332 Major depressive disorder, recurrent severe without psychotic features: Secondary | ICD-10-CM
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
## 2015-06-07 NOTE — Patient Instructions (Signed)
Discussed orally 

## 2015-06-07 NOTE — Telephone Encounter (Signed)
RX printed, left up front and patient aware to pick up  

## 2015-06-12 ENCOUNTER — Encounter (HOSPITAL_COMMUNITY): Payer: Self-pay | Admitting: Psychiatry

## 2015-06-12 ENCOUNTER — Ambulatory Visit (INDEPENDENT_AMBULATORY_CARE_PROVIDER_SITE_OTHER): Payer: Medicare Other | Admitting: Psychiatry

## 2015-06-12 VITALS — BP 104/72 | HR 64 | Ht 63.0 in | Wt 172.6 lb

## 2015-06-12 DIAGNOSIS — F4310 Post-traumatic stress disorder, unspecified: Secondary | ICD-10-CM

## 2015-06-12 DIAGNOSIS — F329 Major depressive disorder, single episode, unspecified: Secondary | ICD-10-CM | POA: Diagnosis not present

## 2015-06-12 DIAGNOSIS — F32A Depression, unspecified: Secondary | ICD-10-CM

## 2015-06-12 MED ORDER — TRAZODONE HCL 100 MG PO TABS: 100.0000 mg | ORAL_TABLET | Freq: Every day | ORAL | Status: DC

## 2015-06-12 MED ORDER — ALPRAZOLAM 1 MG PO TABS: 1.0000 mg | ORAL_TABLET | Freq: Four times a day (QID) | ORAL | Status: DC

## 2015-06-12 MED ORDER — FLUOXETINE HCL 20 MG PO CAPS: 60.0000 mg | ORAL_CAPSULE | Freq: Every day | ORAL | Status: DC
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
## 2015-06-18 ENCOUNTER — Ambulatory Visit: Payer: Self-pay | Admitting: Gastroenterology

## 2015-06-26 ENCOUNTER — Ambulatory Visit (HOSPITAL_COMMUNITY): Payer: Self-pay | Admitting: Psychiatry

## 2015-06-27 ENCOUNTER — Ambulatory Visit (HOSPITAL_COMMUNITY): Payer: Self-pay | Admitting: Psychiatry

## 2015-06-29 ENCOUNTER — Encounter (HOSPITAL_COMMUNITY): Payer: Self-pay | Admitting: Psychiatry

## 2015-06-29 ENCOUNTER — Ambulatory Visit (INDEPENDENT_AMBULATORY_CARE_PROVIDER_SITE_OTHER): Payer: Medicare Other | Admitting: Psychiatry

## 2015-06-29 DIAGNOSIS — F329 Major depressive disorder, single episode, unspecified: Secondary | ICD-10-CM | POA: Diagnosis not present

## 2015-06-29 DIAGNOSIS — F32A Depression, unspecified: Secondary | ICD-10-CM

## 2015-06-29 NOTE — Patient Instructions (Signed)
Discussed orally 
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
## 2015-07-05 ENCOUNTER — Telehealth: Payer: Self-pay | Admitting: Family Medicine

## 2015-07-05 NOTE — Telephone Encounter (Signed)
Pt requests a refill of Hydrocodone 10-325 mg [phone]

## 2015-07-06 MED ORDER — HYDROCODONE-ACETAMINOPHEN 10-325 MG PO TABS: 1.0000 | ORAL_TABLET | ORAL | Status: DC | PRN

## 2015-07-06 NOTE — Telephone Encounter (Signed)
Rx is ready, tried to call pt NA/NVM

## 2015-07-06 NOTE — Telephone Encounter (Signed)
Okay to refill? 

## 2015-07-06 NOTE — Telephone Encounter (Signed)
LRF 06/06/15 #150  LOV 05/01/15  OK refill?

## 2015-07-10 ENCOUNTER — Telehealth (HOSPITAL_COMMUNITY): Payer: Self-pay | Admitting: *Deleted

## 2015-07-10 NOTE — Telephone Encounter (Signed)
patient said she is not feeling well, she is having very bad depressed, she said the Prozac is not working anymore.

## 2015-07-10 NOTE — Telephone Encounter (Signed)
Called pt to inform her of what [name] stated and attempted to make appt and she stated she is starting to feel just a tiny bit better and will wait on both. Per pt, if she starts to feel bad again, at least she know what to do. Office showed understanding

## 2015-07-10 NOTE — Telephone Encounter (Signed)
She will need to come in or go to ED if she feels suicidal

## 2015-07-11 ENCOUNTER — Encounter (HOSPITAL_COMMUNITY): Payer: Self-pay | Admitting: *Deleted

## 2015-07-11 ENCOUNTER — Ambulatory Visit (HOSPITAL_COMMUNITY): Payer: Self-pay | Admitting: Psychiatry

## 2015-07-16 ENCOUNTER — Ambulatory Visit: Payer: Self-pay | Admitting: Gastroenterology

## 2015-07-27 ENCOUNTER — Ambulatory Visit (HOSPITAL_COMMUNITY): Payer: Self-pay | Admitting: Psychiatry

## 2015-07-27 ENCOUNTER — Encounter (HOSPITAL_COMMUNITY): Payer: Self-pay | Admitting: *Deleted

## 2015-08-02 ENCOUNTER — Other Ambulatory Visit: Payer: Self-pay | Admitting: Family Medicine

## 2015-08-02 NOTE — Telephone Encounter (Signed)
Pt is requesting a refill of Hydrocodone 10-325 [phone]

## 2015-08-02 NOTE — Telephone Encounter (Signed)
Ok to refill??  Last office visit 05/01/2015.  Last refill 07/06/2015.

## 2015-08-03 MED ORDER — HYDROCODONE-ACETAMINOPHEN 10-325 MG PO TABS: 1.0000 | ORAL_TABLET | ORAL | Status: DC | PRN

## 2015-08-03 NOTE — Telephone Encounter (Signed)
Okay 

## 2015-08-03 NOTE — Telephone Encounter (Signed)
Pt aware Rx will be ready this afternoon

## 2015-08-09 ENCOUNTER — Ambulatory Visit (HOSPITAL_COMMUNITY): Payer: Self-pay | Admitting: Psychiatry

## 2015-08-10 ENCOUNTER — Ambulatory Visit (HOSPITAL_COMMUNITY): Payer: Self-pay | Admitting: Psychiatry

## 2015-08-15 ENCOUNTER — Ambulatory Visit (INDEPENDENT_AMBULATORY_CARE_PROVIDER_SITE_OTHER): Payer: Medicare Other | Admitting: Psychiatry

## 2015-08-15 ENCOUNTER — Encounter (HOSPITAL_COMMUNITY): Payer: Self-pay | Admitting: Psychiatry

## 2015-08-15 DIAGNOSIS — F32A Depression, unspecified: Secondary | ICD-10-CM

## 2015-08-15 DIAGNOSIS — F329 Major depressive disorder, single episode, unspecified: Secondary | ICD-10-CM
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
## 2015-08-15 NOTE — Patient Instructions (Signed)
Discussed orally 

## 2015-08-16 ENCOUNTER — Ambulatory Visit: Payer: Self-pay | Admitting: Gastroenterology

## 2015-08-31 ENCOUNTER — Ambulatory Visit (INDEPENDENT_AMBULATORY_CARE_PROVIDER_SITE_OTHER): Payer: Medicare Other | Admitting: Psychiatry

## 2015-08-31 ENCOUNTER — Encounter: Payer: Self-pay | Admitting: Family Medicine

## 2015-08-31 ENCOUNTER — Ambulatory Visit (INDEPENDENT_AMBULATORY_CARE_PROVIDER_SITE_OTHER): Payer: Medicare Other | Admitting: Family Medicine

## 2015-08-31 ENCOUNTER — Encounter (HOSPITAL_COMMUNITY): Payer: Self-pay | Admitting: Psychiatry

## 2015-08-31 VITALS — BP 110/68 | HR 68 | Temp 98.0°F | Resp 14 | Ht 63.0 in | Wt 171.0 lb

## 2015-08-31 DIAGNOSIS — Z820 Family history of epilepsy and other diseases of the nervous system: Secondary | ICD-10-CM

## 2015-08-31 DIAGNOSIS — F332 Major depressive disorder, recurrent severe without psychotic features: Secondary | ICD-10-CM | POA: Diagnosis not present

## 2015-08-31 DIAGNOSIS — F4310 Post-traumatic stress disorder, unspecified: Secondary | ICD-10-CM

## 2015-08-31 DIAGNOSIS — G894 Chronic pain syndrome: Secondary | ICD-10-CM | POA: Diagnosis not present

## 2015-08-31 DIAGNOSIS — F333 Major depressive disorder, recurrent, severe with psychotic symptoms: Secondary | ICD-10-CM

## 2015-08-31 DIAGNOSIS — B182 Chronic viral hepatitis C: Secondary | ICD-10-CM | POA: Diagnosis not present

## 2015-08-31 LAB — CBC WITH DIFFERENTIAL/PLATELET
BASOS PCT: 0 %
Basophils Absolute: 0 cells/uL (ref 0–200)
EOS ABS: 45 {cells}/uL (ref 15–500)
Eosinophils Relative: 1 %
HCT: 42.9 % (ref 35.0–45.0)
Hemoglobin: 14.1 g/dL (ref 12.0–15.0)
LYMPHS PCT: 22 %
Lymphs Abs: 990 cells/uL (ref 850–3900)
MCH: 30 pg (ref 27.0–33.0)
MCHC: 32.9 g/dL (ref 32.0–36.0)
MCV: 91.3 fL (ref 80.0–100.0)
MONOS PCT: 10 %
MPV: 11 fL (ref 7.5–12.5)
Monocytes Absolute: 450 cells/uL (ref 200–950)
Neutro Abs: 3015 cells/uL (ref 1500–7800)
Neutrophils Relative %: 67 %
PLATELETS: 167 10*3/uL (ref 140–400)
RBC: 4.7 MIL/uL (ref 3.80–5.10)
RDW: 15.4 % — AB (ref 11.0–15.0)
WBC: 4.5 10*3/uL (ref 3.8–10.8)

## 2015-08-31 LAB — COMPREHENSIVE METABOLIC PANEL
ALK PHOS: 56 U/L (ref 33–130)
ALT: 66 U/L — AB (ref 6–29)
AST: 58 U/L — ABNORMAL HIGH (ref 10–35)
Albumin: 4.2 g/dL (ref 3.6–5.1)
BUN: 11 mg/dL (ref 7–25)
CHLORIDE: 103 mmol/L (ref 98–110)
CO2: 28 mmol/L (ref 20–31)
Calcium: 9.4 mg/dL (ref 8.6–10.4)
Creat: 0.8 mg/dL (ref 0.50–1.05)
GLUCOSE: 92 mg/dL (ref 70–99)
POTASSIUM: 4.2 mmol/L (ref 3.5–5.3)
Sodium: 141 mmol/L (ref 135–146)
Total Bilirubin: 0.4 mg/dL (ref 0.2–1.2)
Total Protein: 6.9 g/dL (ref 6.1–8.1)

## 2015-08-31 MED ORDER — HYDROCODONE-ACETAMINOPHEN 10-325 MG PO TABS: 1.0000 | ORAL_TABLET | Freq: Four times a day (QID) | ORAL | Status: DC | PRN

## 2015-08-31 NOTE — Patient Instructions (Signed)
Pain medication decreased to 4 a day We will call with lab results  F/U 4 months

## 2015-08-31 NOTE — Assessment & Plan Note (Signed)
Family history of ALS and she would benefit from seeing a neurologist in discussing her risk as there is a genetic component to this. She is also a twin.  We will continue to address, no current neurological signs

## 2015-08-31 NOTE — Assessment & Plan Note (Signed)
Severe depression, interfering with overall function, continue with outpatient therapy and psychiatry

## 2015-08-31 NOTE — Progress Notes (Signed)
Patient ID: Kathleen Palmer, female   DOB: January 08, 1961, 55 y.o.   MRN: [id number]    Subjective:    Patient ID: Kathleen Palmer, female    DOB: 1960-08-10, 55 y.o.   MRN: [id number]  Patient presents for Follow-up  Pt here for F/U on chronic medical problems COntinues to on chronic narcotics for chronic pain medicationUnfortunately her liver function have been elevated from her underlying hepatitis C   At our last visit she has not followed through with different specialist such as Hep C, concern for heridary neurological,She recently found out that her twin sister has ALS. She canceled her appointments and did not follow through with any of the specialists from January. She has been going to her psychiatrist. She states that her depression is just been very severe and she has not been taking care of anything. Origin is the same story that I hear at every visit.  She is concerned that she popped a small hernia trying to move some furniture this is near her ostomy site she has other hernias from previous surgeries.  She was admitted to [facility] for a 10 day stay since her last visit  Review Of Systems:  GEN- denies fatigue, fever, weight loss,weakness, recent illness HEENT- denies eye drainage, change in vision, nasal discharge, CVS- denies chest pain, palpitations RESP- denies SOB, cough, wheeze ABD- denies N/V, change in stools+, abd pain GU- denies dysuria, hematuria, dribbling, incontinence MSK-+ joint pain, muscle aches, injury Neuro- denies headache, dizziness, syncope, seizure activity       Objective:    BP 110/68 mmHg  Pulse 68  Temp(Src) 98 F (36.7 C) (Oral)  Resp 14  Ht 5\' 3"  (1.6 m)  Wt 171 lb (77.565 kg)  BMI 30.30 kg/m2 GEN- NAD, alert and oriented x3 HEENT- PERRL, EOMI, non injected sclera, pink conjunctiva, MMM, oropharynx clear, non icteric CVS- RRR, no murmur RESP-CTAB ABD-NABS,soft,NT,no HSM, ND, urostomy in tact , small ventral hernia, easily reduced  adjacent to ostomy site Psych- depressed affect, no SI, no hallucinations  EXT- No edema Pulses- Radial 2+      Assessment & Plan:      Problem List Items Addressed This Visit    MDD (major depressive disorder) (HCC)    Severe depression, interfering with overall function, continue with outpatient therapy and psychiatry      Hepatitis C    Untreated hepatitis C , stressed importance of treatment for her to liver disease. At this point I do not think that she is to follow through with this. She does understand the risk involved       Relevant Orders   CBC with Differential/Platelet   Comprehensive metabolic panel   Chronic pain syndrome - Primary    Reducer norco to 4x a day, due to her liver function, discussed that we are doing more harm if she does not treat her underlying liver disease. Unfortunatley she does not tolerate oxycodone and I dont not think Morphine is best for her with her other psychiatric illness          Note: This dictation was prepared with Dragon dictation along with smaller phrase technology. Any transcriptional errors that result from this process are unintentional.

## 2015-08-31 NOTE — Assessment & Plan Note (Signed)
Reducer norco to 4x a day, due to her liver function, discussed that we are doing more harm if she does not treat her underlying liver disease. Unfortunatley she does not tolerate oxycodone and I dont not think Morphine is best for her with her other psychiatric illness

## 2015-08-31 NOTE — Patient Instructions (Signed)
Discussed orally 
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
## 2015-08-31 NOTE — Assessment & Plan Note (Addendum)
Untreated hepatitis C , stressed importance of treatment for her to liver disease. At this point I do not think that she is to follow through with this. She does understand the risk involved

## 2015-09-03 ENCOUNTER — Telehealth (HOSPITAL_COMMUNITY): Payer: Self-pay | Admitting: *Deleted

## 2015-09-03 NOTE — Telephone Encounter (Signed)
Pt pharmacy requesting refills for pt Fluoxetine HCL 20 mg 3 Tablets QD. Pt medication last filled 06-12-15 with 2 refills.

## 2015-09-04 ENCOUNTER — Other Ambulatory Visit (HOSPITAL_COMMUNITY): Payer: Self-pay | Admitting: Psychiatry

## 2015-09-04 MED ORDER — FLUOXETINE HCL 20 MG PO CAPS: 60.0000 mg | ORAL_CAPSULE | Freq: Every day | ORAL | Status: DC

## 2015-09-04 NOTE — Telephone Encounter (Signed)
sent 

## 2015-09-04 NOTE — Telephone Encounter (Signed)
noted 

## 2015-09-05 ENCOUNTER — Other Ambulatory Visit: Payer: Self-pay | Admitting: Family Medicine

## 2015-09-05 DIAGNOSIS — Z1231 Encounter for screening mammogram for malignant neoplasm of breast: Secondary | ICD-10-CM

## 2015-09-06 ENCOUNTER — Ambulatory Visit (HOSPITAL_COMMUNITY): Payer: Medicare Other | Admitting: Psychiatry

## 2015-09-07 ENCOUNTER — Encounter (HOSPITAL_COMMUNITY): Payer: Self-pay | Admitting: Psychiatry

## 2015-09-07 ENCOUNTER — Ambulatory Visit (INDEPENDENT_AMBULATORY_CARE_PROVIDER_SITE_OTHER): Payer: Medicare Other | Admitting: Psychiatry

## 2015-09-07 VITALS — BP 149/80 | HR 59 | Ht 63.0 in | Wt 174.6 lb

## 2015-09-07 DIAGNOSIS — F332 Major depressive disorder, recurrent severe without psychotic features: Secondary | ICD-10-CM

## 2015-09-07 DIAGNOSIS — F4310 Post-traumatic stress disorder, unspecified: Secondary | ICD-10-CM

## 2015-09-07 MED ORDER — ALPRAZOLAM 1 MG PO TABS: 1.0000 mg | ORAL_TABLET | Freq: Four times a day (QID) | ORAL | Status: DC
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
## 2015-09-11 ENCOUNTER — Telehealth (HOSPITAL_COMMUNITY): Payer: Self-pay | Admitting: *Deleted

## 2015-09-12 ENCOUNTER — Ambulatory Visit (HOSPITAL_COMMUNITY)
Admission: RE | Admit: 2015-09-12 | Discharge: 2015-09-12 | Disposition: A | Discharge status: Home / Self Care | Payer: Medicare Other | Source: Ambulatory Visit | Attending: Family Medicine | Admitting: Family Medicine

## 2015-09-12 DIAGNOSIS — Z1231 Encounter for screening mammogram for malignant neoplasm of breast: Secondary | ICD-10-CM | POA: Insufficient documentation

## 2015-09-14 ENCOUNTER — Ambulatory Visit (HOSPITAL_COMMUNITY): Payer: Self-pay | Admitting: Psychiatry

## 2015-09-25 ENCOUNTER — Ambulatory Visit (HOSPITAL_COMMUNITY): Payer: Self-pay | Admitting: Psychiatry

## 2015-09-26 ENCOUNTER — Ambulatory Visit: Payer: Self-pay | Admitting: Gastroenterology

## 2015-09-26 ENCOUNTER — Ambulatory Visit: Payer: Self-pay | Admitting: Nurse Practitioner

## 2015-09-27 ENCOUNTER — Telehealth: Payer: Self-pay | Admitting: Family Medicine

## 2015-09-27 NOTE — Telephone Encounter (Signed)
Ok to refill??  Last office visit/ refill 08/31/2015.

## 2015-09-27 NOTE — Telephone Encounter (Signed)
Pt is requesting a refill of Hydrocodone 10-325

## 2015-09-28 ENCOUNTER — Ambulatory Visit (HOSPITAL_COMMUNITY): Payer: Medicare Other | Admitting: Psychiatry

## 2015-09-28 MED ORDER — HYDROCODONE-ACETAMINOPHEN 10-325 MG PO TABS
1.0000 | ORAL_TABLET | Freq: Four times a day (QID) | ORAL | Status: DC | PRN
Start: 2015-09-28 — End: 2015-10-25

## 2015-09-28 NOTE — Telephone Encounter (Signed)
Prescription printed and patient made aware to come to office to pick up after 2pm on 09/28/2015.

## 2015-09-28 NOTE — Telephone Encounter (Signed)
Okay to refill? 

## 2015-10-02 ENCOUNTER — Ambulatory Visit (HOSPITAL_COMMUNITY): Payer: Self-pay | Admitting: Psychiatry

## 2015-10-04 ENCOUNTER — Ambulatory Visit: Payer: Self-pay | Admitting: Gastroenterology

## 2015-10-04 ENCOUNTER — Encounter: Payer: Self-pay | Admitting: Gastroenterology

## 2015-10-08 ENCOUNTER — Encounter (HOSPITAL_COMMUNITY): Payer: Self-pay | Admitting: Psychiatry

## 2015-10-08 ENCOUNTER — Ambulatory Visit (INDEPENDENT_AMBULATORY_CARE_PROVIDER_SITE_OTHER): Payer: Medicare Other | Admitting: Psychiatry

## 2015-10-08 VITALS — BP 141/82 | HR 80 | Ht 63.0 in | Wt 168.0 lb

## 2015-10-08 DIAGNOSIS — F332 Major depressive disorder, recurrent severe without psychotic features: Secondary | ICD-10-CM

## 2015-10-08 MED ORDER — ALPRAZOLAM 1 MG PO TABS: 1.0000 mg | ORAL_TABLET | Freq: Four times a day (QID) | ORAL | Status: DC

## 2015-10-08 MED ORDER — TRAZODONE HCL 100 MG PO TABS: 200.0000 mg | ORAL_TABLET | Freq: Every day | ORAL | Status: DC

## 2015-10-08 MED ORDER — FLUOXETINE HCL 40 MG PO CAPS: 40.0000 mg | ORAL_CAPSULE | Freq: Every day | ORAL | Status: DC
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
## 2015-10-12 ENCOUNTER — Ambulatory Visit (HOSPITAL_COMMUNITY): Payer: Self-pay | Admitting: Psychiatry

## 2015-10-12 ENCOUNTER — Encounter (HOSPITAL_COMMUNITY): Payer: Self-pay | Admitting: Psychiatry

## 2015-10-12 ENCOUNTER — Ambulatory Visit (INDEPENDENT_AMBULATORY_CARE_PROVIDER_SITE_OTHER): Payer: Medicare Other | Admitting: Psychiatry

## 2015-10-12 DIAGNOSIS — F332 Major depressive disorder, recurrent severe without psychotic features: Secondary | ICD-10-CM | POA: Diagnosis not present
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
## 2015-10-12 NOTE — Patient Instructions (Signed)
Discussed orally 

## 2015-10-19 ENCOUNTER — Ambulatory Visit: Payer: Self-pay | Admitting: Gastroenterology

## 2015-10-25 ENCOUNTER — Telehealth: Payer: Self-pay | Admitting: Family Medicine

## 2015-10-25 MED ORDER — HYDROCODONE-ACETAMINOPHEN 10-325 MG PO TABS: 1.0000 | ORAL_TABLET | Freq: Four times a day (QID) | ORAL | Status: DC | PRN

## 2015-10-25 NOTE — Telephone Encounter (Signed)
Prescription printed and patient made aware to come to office to pick up on 10/26/2015 after 10am.

## 2015-10-25 NOTE — Telephone Encounter (Signed)
Pt is requesting a refill of Hydrocodone 10-325 [phone]

## 2015-10-25 NOTE — Telephone Encounter (Signed)
Ok to refill??  Last office visit 08/31/2015.  Last refill 09/28/2015.

## 2015-10-25 NOTE — Telephone Encounter (Signed)
okay

## 2015-10-31 ENCOUNTER — Ambulatory Visit (HOSPITAL_COMMUNITY): Payer: Self-pay | Admitting: Psychiatry

## 2015-11-01 ENCOUNTER — Encounter (HOSPITAL_COMMUNITY): Payer: Self-pay | Admitting: *Deleted

## 2015-11-06 DIAGNOSIS — H524 Presbyopia: Secondary | ICD-10-CM | POA: Diagnosis not present

## 2015-11-06 DIAGNOSIS — H2513 Age-related nuclear cataract, bilateral: Secondary | ICD-10-CM | POA: Diagnosis not present

## 2015-11-14 ENCOUNTER — Ambulatory Visit (HOSPITAL_COMMUNITY): Payer: Self-pay | Admitting: Psychiatry

## 2015-11-15 ENCOUNTER — Encounter (HOSPITAL_COMMUNITY): Payer: Self-pay | Admitting: Psychiatry

## 2015-11-15 ENCOUNTER — Ambulatory Visit (INDEPENDENT_AMBULATORY_CARE_PROVIDER_SITE_OTHER): Payer: Medicare Other | Admitting: Psychiatry

## 2015-11-15 VITALS — BP 110/74 | HR 72 | Ht 63.0 in | Wt 164.4 lb

## 2015-11-15 DIAGNOSIS — F32A Depression, unspecified: Secondary | ICD-10-CM

## 2015-11-15 DIAGNOSIS — F329 Major depressive disorder, single episode, unspecified: Secondary | ICD-10-CM

## 2015-11-15 MED ORDER — FLUOXETINE HCL 20 MG PO CAPS: 20.0000 mg | ORAL_CAPSULE | Freq: Every day | ORAL | Status: DC

## 2015-11-15 MED ORDER — FLUOXETINE HCL 40 MG PO CAPS: 40.0000 mg | ORAL_CAPSULE | Freq: Every day | ORAL | Status: DC

## 2015-11-15 MED ORDER — TRAZODONE HCL 100 MG PO TABS: 200.0000 mg | ORAL_TABLET | Freq: Every day | ORAL | Status: DC
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
## 2015-11-22 ENCOUNTER — Ambulatory Visit: Payer: Self-pay | Admitting: Nurse Practitioner

## 2015-11-22 ENCOUNTER — Telehealth: Payer: Self-pay | Admitting: Family Medicine

## 2015-11-22 NOTE — Telephone Encounter (Signed)
Patient calling to get rx for hydrocodone [phone]

## 2015-11-22 NOTE — Telephone Encounter (Signed)
Ok to refill??  Last office visit 08/31/2015.  Last refill 10/25/2015.

## 2015-11-23 MED ORDER — HYDROCODONE-ACETAMINOPHEN 10-325 MG PO TABS: 1.0000 | ORAL_TABLET | Freq: Four times a day (QID) | ORAL | 0 refills | Status: DC | PRN

## 2015-11-23 NOTE — Telephone Encounter (Signed)
Prescription printed and patient made aware to come to office to pick up after 2pm on 11/23/2015.

## 2015-11-23 NOTE — Telephone Encounter (Signed)
okay

## 2015-11-26 ENCOUNTER — Telehealth (HOSPITAL_COMMUNITY): Payer: Self-pay | Admitting: *Deleted

## 2015-11-26 ENCOUNTER — Ambulatory Visit (HOSPITAL_COMMUNITY): Payer: Self-pay | Admitting: Psychiatry

## 2015-11-26 NOTE — Telephone Encounter (Signed)
Schedule one appointment
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
## 2015-11-28 ENCOUNTER — Telehealth (HOSPITAL_COMMUNITY): Payer: Self-pay | Admitting: *Deleted

## 2015-11-28 NOTE — Telephone Encounter (Signed)
pt called to cancel her appt stating she is having a crying spell and depressed and can not get out of the bed. Tried to see if pt could come tomorrow and pt refused. Per pt, its mental and she wants to resch. Informed pt if she feels like it's an emergency to please go to the ER. Pt agreed.

## 2015-11-29 ENCOUNTER — Telehealth (HOSPITAL_COMMUNITY): Payer: Self-pay | Admitting: *Deleted

## 2015-11-29 ENCOUNTER — Ambulatory Visit (HOSPITAL_COMMUNITY): Payer: Self-pay | Admitting: Psychiatry

## 2015-11-29 NOTE — Telephone Encounter (Signed)
noted 

## 2015-11-29 NOTE — Telephone Encounter (Signed)
phone call from patient, inquiring about appointment dates and times. this information was given to her several times, before she could confirm the correct dates and times.   Since her husband comes with her to her appointments, I asked if she would like to sign a release so he could help her with her appointment dates and times.  She said she is alright.   She could do it on her on.

## 2015-11-30 ENCOUNTER — Ambulatory Visit: Payer: 59 | Admitting: Family Medicine

## 2015-12-03 ENCOUNTER — Ambulatory Visit: Payer: Medicare Other | Admitting: Family Medicine

## 2015-12-04 ENCOUNTER — Ambulatory Visit (INDEPENDENT_AMBULATORY_CARE_PROVIDER_SITE_OTHER): Payer: Medicare Other | Admitting: Family Medicine

## 2015-12-04 ENCOUNTER — Encounter: Payer: Self-pay | Admitting: Family Medicine

## 2015-12-04 ENCOUNTER — Ambulatory Visit: Payer: Medicare Other | Admitting: Family Medicine

## 2015-12-04 VITALS — BP 128/64 | HR 80 | Temp 97.9°F | Resp 14 | Ht 63.0 in | Wt 169.0 lb

## 2015-12-04 DIAGNOSIS — N390 Urinary tract infection, site not specified: Secondary | ICD-10-CM | POA: Diagnosis not present

## 2015-12-04 LAB — URINALYSIS, ROUTINE W REFLEX MICROSCOPIC
Bilirubin Urine: NEGATIVE
GLUCOSE, UA: NEGATIVE
Ketones, ur: NEGATIVE
Nitrite: NEGATIVE
PROTEIN: NEGATIVE
SPECIFIC GRAVITY, URINE: 1.015 (ref 1.001–1.035)
pH: 6 (ref 5.0–8.0)

## 2015-12-04 LAB — URINALYSIS, MICROSCOPIC ONLY
CRYSTALS: NONE SEEN [HPF]
Casts: NONE SEEN [LPF]
YEAST: NONE SEEN [HPF]

## 2015-12-04 MED ORDER — CIPROFLOXACIN HCL 500 MG PO TABS: 500.0000 mg | ORAL_TABLET | Freq: Two times a day (BID) | ORAL | 0 refills | Status: DC

## 2015-12-04 NOTE — Progress Notes (Signed)
   Subjective:    Patient ID: Kathleen Palmer, female    DOB: 31-Jul-1960, 55 y.o.   MRN: [id number]  Patient presents for Dysuria (malodorous dark urine)  Patient with malodor from her urostomy. 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 She had noticed more pus and discharge into her urostomy also noted that the urine was much darker and has an odor, she has been fatigued. She has chronic abdominal pain which is not changed no fever no chills no nausea vomiting. I have not had to treat any urinary infections.  She has had UTI in past  Review Of Systems:  GEN- denies fatigue, fever, weight loss,weakness, recent illness HEENT- denies eye drainage, change in vision, nasal discharge, CVS- denies chest pain, palpitations RESP- denies SOB, cough, wheeze ABD- denies N/V, change in stools,+ chronic  abd pain GU- denies dysuria, hematuria, dribbling, incontinence MSK- denies joint pain, muscle aches, injury Neuro- denies headache, dizziness, syncope, seizure activity       Objective:    BP 128/64 (BP Location: Left Arm, Patient Position: Sitting, Cuff Size: Normal)   Pulse 80   Temp 97.9 F (36.6 C) (Oral)   Resp 14   Ht 5\' 3"  (1.6 m)   Wt 169 lb (76.7 kg)   BMI 29.94 kg/m  GEN- NAD, alert and oriented x3 HEENT- PERRL, EOMI, non injected sclera, pink conjunctiva, MMM, oropharynx clear CVS- RRR, no murmur RESP-CTAB ABD-NABS,soft,mild TTP around urostomyy bag, no erythema, dark yellow urine with sediment strong odor  Pulses- Radial  2+        Assessment & Plan:      Problem List Items Addressed This Visit    None    Visit Diagnoses    UTI (lower urinary tract infection)    -  Primary   concern for UTI, i know she has colonized bacteria as well due to having urostomy in general and urine only taken from bag. Start Cipro culture sent    Relevant Orders   Urine culture   Urinalysis, Routine w reflex microscopic (not at [facility])      Note: This dictation was prepared with Dragon dictation along with smaller phrase technology. Any transcriptional errors that result from this process are unintentional.

## 2015-12-04 NOTE — Patient Instructions (Signed)
Take antibiotics F/u as previous 

## 2015-12-05 ENCOUNTER — Telehealth: Payer: Self-pay | Admitting: Gastroenterology

## 2015-12-05 ENCOUNTER — Ambulatory Visit: Payer: Self-pay | Admitting: Nurse Practitioner

## 2015-12-05 NOTE — Telephone Encounter (Signed)
REVIEWED. PT LAST SEEN SEP 2015. NO  RECOMMENDATIONS.

## 2015-12-05 NOTE — Telephone Encounter (Signed)
PT returned my call and was crying and just wanted to let us know why she cancelled her appt. Said she has a lot of mental stuff going on and sometimes she even has suicidal thoughts. She assured me that she is not having suicidal thoughts now and promised to seek help if she does.  She said her grandmother ended up in a mental institution, her mom over dosed and her twin tried to commit suicide.  She just goes through a lot but she promised to keep her appt on 01/02/2016 at 8:30 Am with Walden Field, NP. I told her I would mail her an appointment reminder to put on her fridge to remember the appt. She thanked me over and over for talking to her and for mailing her a reminder card.

## 2015-12-05 NOTE — Telephone Encounter (Signed)
See previous note

## 2015-12-05 NOTE — Telephone Encounter (Signed)
PLEASE CALL PATIENT, SHE STATED THAT SHE DOES NOT WANT TO TAKE THE MEDICINE FIR HER CIRRHOSIS AND SOUNDED VERY UPSET.  [phone]

## 2015-12-05 NOTE — Telephone Encounter (Signed)
I called and LMOM for a return call.

## 2015-12-05 NOTE — Telephone Encounter (Signed)
Pt was returning a call from DS. Please call [phone]

## 2015-12-05 NOTE — Telephone Encounter (Signed)
Per Kathleen Palmer pt rescheduled the appt she had this afternoon until 01/02/2016. Forwarding to [place] to [place] the number of cancellations pt has made.

## 2015-12-05 NOTE — Telephone Encounter (Signed)
If patient no shows or reschedules her next appointment, we will forward to [facility] for advice on possible discharges

## 2015-12-05 NOTE — Telephone Encounter (Signed)
I have personally spoke to Dr. Oneida Alar and made her aware.

## 2015-12-06 LAB — URINE CULTURE: Organism ID, Bacteria: 10000

## 2015-12-11 ENCOUNTER — Encounter (HOSPITAL_COMMUNITY): Payer: Self-pay | Admitting: Psychiatry

## 2015-12-14 ENCOUNTER — Encounter (HOSPITAL_COMMUNITY): Payer: Self-pay | Admitting: Psychiatry

## 2015-12-14 ENCOUNTER — Ambulatory Visit (INDEPENDENT_AMBULATORY_CARE_PROVIDER_SITE_OTHER): Payer: Medicare Other | Admitting: Psychiatry

## 2015-12-14 VITALS — BP 105/69 | HR 60 | Ht 63.0 in | Wt 167.2 lb

## 2015-12-14 DIAGNOSIS — F332 Major depressive disorder, recurrent severe without psychotic features: Secondary | ICD-10-CM

## 2015-12-14 MED ORDER — FLUOXETINE HCL 20 MG PO CAPS: 20.0000 mg | ORAL_CAPSULE | Freq: Every day | ORAL | 2 refills | Status: DC

## 2015-12-14 MED ORDER — FLUOXETINE HCL 40 MG PO CAPS: 40.0000 mg | ORAL_CAPSULE | Freq: Every day | ORAL | 2 refills | Status: DC

## 2015-12-14 MED ORDER — TRAZODONE HCL 100 MG PO TABS: 200.0000 mg | ORAL_TABLET | Freq: Every day | ORAL | 2 refills | Status: DC

## 2015-12-14 MED ORDER — ALPRAZOLAM 1 MG PO TABS: 1.0000 mg | ORAL_TABLET | Freq: Four times a day (QID) | ORAL | 2 refills | Status: DC
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
## 2015-12-18 ENCOUNTER — Encounter (HOSPITAL_COMMUNITY): Payer: Self-pay | Admitting: Psychiatry

## 2015-12-18 ENCOUNTER — Ambulatory Visit (INDEPENDENT_AMBULATORY_CARE_PROVIDER_SITE_OTHER): Payer: Medicare Other | Admitting: Psychiatry

## 2015-12-18 DIAGNOSIS — F332 Major depressive disorder, recurrent severe without psychotic features: Secondary | ICD-10-CM | POA: Diagnosis not present
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
## 2015-12-20 ENCOUNTER — Telehealth: Payer: Self-pay | Admitting: Family Medicine

## 2015-12-20 NOTE — Telephone Encounter (Signed)
Ok to refill??  Last office visit 12/04/2015.  Last refill 11/23/2015.

## 2015-12-20 NOTE — Telephone Encounter (Signed)
Patient needs rx for her hydrocodone  [phone]

## 2015-12-20 NOTE — Telephone Encounter (Signed)
Kathleen Palmer called back saying her pain medication will run out this weekend and she's wondering if she can pick up the Rx tomorrow. Please give her a call if needed.  Pt's ph# [phone] Thank you.

## 2015-12-21 MED ORDER — HYDROCODONE-ACETAMINOPHEN 10-325 MG PO TABS: 1.0000 | ORAL_TABLET | Freq: Four times a day (QID) | ORAL | 0 refills | Status: DC | PRN

## 2015-12-21 NOTE — Telephone Encounter (Signed)
Prescription printed and patient made aware to come to office to pick up after 2 pm on 12/21/2015. 

## 2015-12-21 NOTE — Telephone Encounter (Signed)
okay

## 2016-01-02 ENCOUNTER — Ambulatory Visit: Payer: Self-pay | Admitting: Nurse Practitioner

## 2016-01-11 ENCOUNTER — Ambulatory Visit (INDEPENDENT_AMBULATORY_CARE_PROVIDER_SITE_OTHER): Payer: Medicare Other | Admitting: Psychiatry

## 2016-01-11 DIAGNOSIS — F332 Major depressive disorder, recurrent severe without psychotic features: Secondary | ICD-10-CM
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
## 2016-01-14 ENCOUNTER — Ambulatory Visit (HOSPITAL_COMMUNITY): Payer: Self-pay | Admitting: Psychiatry

## 2016-01-16 ENCOUNTER — Ambulatory Visit (INDEPENDENT_AMBULATORY_CARE_PROVIDER_SITE_OTHER): Payer: Medicare Other | Admitting: Psychiatry

## 2016-01-16 ENCOUNTER — Encounter (HOSPITAL_COMMUNITY): Payer: Self-pay | Admitting: Psychiatry

## 2016-01-16 ENCOUNTER — Telehealth: Payer: Self-pay | Admitting: *Deleted

## 2016-01-16 VITALS — BP 119/86 | HR 64 | Ht 63.0 in | Wt 169.8 lb

## 2016-01-16 DIAGNOSIS — F332 Major depressive disorder, recurrent severe without psychotic features: Secondary | ICD-10-CM

## 2016-01-16 DIAGNOSIS — F4310 Post-traumatic stress disorder, unspecified: Secondary | ICD-10-CM

## 2016-01-16 MED ORDER — ALPRAZOLAM 1 MG PO TABS: 1.0000 mg | ORAL_TABLET | Freq: Four times a day (QID) | ORAL | 2 refills | Status: DC

## 2016-01-16 MED ORDER — FLUOXETINE HCL 20 MG PO CAPS: 20.0000 mg | ORAL_CAPSULE | Freq: Every day | ORAL | 2 refills | Status: DC

## 2016-01-16 MED ORDER — TRAZODONE HCL 100 MG PO TABS: 200.0000 mg | ORAL_TABLET | Freq: Every day | ORAL | 2 refills | Status: DC

## 2016-01-16 MED ORDER — LURASIDONE HCL 20 MG PO TABS: 20.0000 mg | ORAL_TABLET | Freq: Every day | ORAL | 2 refills | Status: DC

## 2016-01-16 MED ORDER — FLUOXETINE HCL 40 MG PO CAPS: 40.0000 mg | ORAL_CAPSULE | Freq: Every day | ORAL | 2 refills | Status: DC
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
## 2016-01-16 NOTE — Telephone Encounter (Signed)
Received call from patient.   Requested refill on Norco.  Ok to refill??  Last office visit 12/04/2015.  Last refill 12/21/2015.

## 2016-01-16 NOTE — Telephone Encounter (Signed)
okay

## 2016-01-17 ENCOUNTER — Telehealth: Payer: Self-pay | Admitting: Family Medicine

## 2016-01-17 ENCOUNTER — Telehealth (HOSPITAL_COMMUNITY): Payer: Self-pay | Admitting: *Deleted

## 2016-01-17 MED ORDER — HYDROCODONE-ACETAMINOPHEN 10-325 MG PO TABS: 1.0000 | ORAL_TABLET | Freq: Four times a day (QID) | ORAL | 0 refills | Status: DC | PRN

## 2016-01-17 NOTE — Telephone Encounter (Signed)
Spoke with pt and she stated her [name] is working but the pharmacy is saying it's $500.00. Informed pt to have pharmacy send a PA. Office received PA and was faxed to [place]. Pt states she is out of refills and would like to samples.

## 2016-01-17 NOTE — Telephone Encounter (Signed)
Please see prior message.   

## 2016-01-17 NOTE — Telephone Encounter (Signed)
Patient needs rx for her hydrocodone  [phone]

## 2016-01-17 NOTE — Telephone Encounter (Signed)
LATUDA IS WORKING REALLY GOOD, BUT IT COST $500.  SHE CAN'T AFORD.   SHE WOULD LIKE SAMPLES.

## 2016-01-17 NOTE — Telephone Encounter (Signed)
You may give her samples

## 2016-01-17 NOTE — Telephone Encounter (Signed)
Prescription printed and patient made aware to come to office to pick up on 01/18/2016.

## 2016-01-18 ENCOUNTER — Encounter (HOSPITAL_COMMUNITY): Payer: Self-pay | Admitting: *Deleted

## 2016-01-18 NOTE — Progress Notes (Signed)
Per previous call, pt needed samples for her Latuda 20 mg. Pt D/L number is [id number] with expiration date of 12-12-2023. Pt was given 2 boxes while prior Auth is being completed. In each box, theres 4 cards with 7 tablets each. 1 box LOT number is [id number] with expiration date of 10-211. The other box LOT number is [id number] with expiration date of 05-2019. Pt verbalized understanding.

## 2016-01-18 NOTE — Telephone Encounter (Signed)
Called pt and informed her with what provider stated.

## 2016-01-22 ENCOUNTER — Ambulatory Visit: Payer: Self-pay | Admitting: Nurse Practitioner

## 2016-01-22 ENCOUNTER — Telehealth (HOSPITAL_COMMUNITY): Payer: Self-pay | Admitting: *Deleted

## 2016-01-22 NOTE — Telephone Encounter (Signed)
Prior authorization for Latuda received. Called [phone] spoke with Aaron Edelman who states no authorization is required it is a Tier 4 medication and  co-pay is over $200 monthly.States he will fax lower cost alternatives. A Tier reduction can be requested if the MD feels the lower tier medication will be ineffective or a change would not be beneficial to the patient since the medication is currently working well.

## 2016-01-22 NOTE — Telephone Encounter (Signed)
We have tried numerous other meds, this one works best

## 2016-01-29 ENCOUNTER — Encounter (HOSPITAL_COMMUNITY): Payer: Self-pay | Admitting: Psychiatry

## 2016-01-29 ENCOUNTER — Telehealth (HOSPITAL_COMMUNITY): Payer: Self-pay | Admitting: *Deleted

## 2016-01-29 ENCOUNTER — Ambulatory Visit (HOSPITAL_COMMUNITY): Payer: Self-pay | Admitting: Psychiatry

## 2016-01-29 NOTE — Telephone Encounter (Signed)
Called pt to resch appt for Oct 13 due to provider not being in office. Unable to reach patient due to phone keeps giving busy tone. Will try to call pt at another time.

## 2016-02-06 ENCOUNTER — Encounter (HOSPITAL_COMMUNITY): Payer: Self-pay

## 2016-02-06 ENCOUNTER — Ambulatory Visit (HOSPITAL_COMMUNITY): Payer: Self-pay | Admitting: Psychiatry

## 2016-02-07 ENCOUNTER — Ambulatory Visit (HOSPITAL_COMMUNITY): Payer: Self-pay | Admitting: Psychiatry

## 2016-02-08 ENCOUNTER — Ambulatory Visit (HOSPITAL_COMMUNITY): Payer: Self-pay | Admitting: Psychiatry

## 2016-02-12 ENCOUNTER — Ambulatory Visit: Payer: Self-pay | Admitting: Nurse Practitioner

## 2016-02-13 ENCOUNTER — Telehealth: Payer: Self-pay | Admitting: *Deleted

## 2016-02-13 NOTE — Telephone Encounter (Signed)
Received call from patient.   Requested refill on Hydrocodone.   Ok to refill??  Last office visit 12/04/2015.  Last refill 01/17/2016.

## 2016-02-13 NOTE — Telephone Encounter (Signed)
OKAY...

## 2016-02-14 ENCOUNTER — Encounter (HOSPITAL_COMMUNITY): Payer: Self-pay | Admitting: Psychiatry

## 2016-02-14 ENCOUNTER — Ambulatory Visit (INDEPENDENT_AMBULATORY_CARE_PROVIDER_SITE_OTHER): Payer: Medicare Other | Admitting: Psychiatry

## 2016-02-14 DIAGNOSIS — F332 Major depressive disorder, recurrent severe without psychotic features: Secondary | ICD-10-CM

## 2016-02-14 MED ORDER — HYDROCODONE-ACETAMINOPHEN 10-325 MG PO TABS: 1.0000 | ORAL_TABLET | Freq: Four times a day (QID) | ORAL | 0 refills | Status: DC | PRN
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
## 2016-02-14 NOTE — Telephone Encounter (Signed)
Prescription printed and patient made aware to come to office to pick up on 02/15/2016.

## 2016-03-11 ENCOUNTER — Other Ambulatory Visit: Payer: Self-pay | Admitting: Family Medicine

## 2016-03-11 MED ORDER — HYDROCODONE-ACETAMINOPHEN 10-325 MG PO TABS: 1.0000 | ORAL_TABLET | Freq: Four times a day (QID) | ORAL | 0 refills | Status: DC | PRN

## 2016-03-11 NOTE — Telephone Encounter (Signed)
Okay to refill? 

## 2016-03-11 NOTE — Telephone Encounter (Signed)
Rx printed for signature nad dated do not fill until 03/15/16.  Pt aware and can pick up anytime starting tomorrow morning.

## 2016-03-11 NOTE — Telephone Encounter (Signed)
Requesting refill of pain meds so she can pick up by end of week

## 2016-03-12 ENCOUNTER — Telehealth (HOSPITAL_COMMUNITY): Payer: Self-pay | Admitting: *Deleted

## 2016-03-12 ENCOUNTER — Ambulatory Visit (INDEPENDENT_AMBULATORY_CARE_PROVIDER_SITE_OTHER): Payer: Medicare Other | Admitting: Psychiatry

## 2016-03-12 ENCOUNTER — Encounter (HOSPITAL_COMMUNITY): Payer: Self-pay | Admitting: Psychiatry

## 2016-03-12 DIAGNOSIS — F332 Major depressive disorder, recurrent severe without psychotic features: Secondary | ICD-10-CM | POA: Diagnosis not present

## 2016-03-12 NOTE — Telephone Encounter (Signed)
Called pt insurance [employer] for her Tier to be reduced to another Tier due to [place] co-pay being too expensive for pt. Spoke with Jarrett Soho (Rep) in the pharmacy help desk who will be faxing office a form to fill out. Per Jarrett Soho, provider will have to have a supporting statemen indicating that the Latuda in a high tier will not be as effective as requested for a non perfered drug (Tier 4 medication). Provider should basically say that by putting pt at a Tier 3 or lower would help the patient with the co-pay etc. Per Jarrett Soho, provider supporting statement does not automatically result in a favorable determination. Ref number is [id number]. Help desk Rep number is [phone].
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
## 2016-03-17 ENCOUNTER — Ambulatory Visit (INDEPENDENT_AMBULATORY_CARE_PROVIDER_SITE_OTHER): Payer: Medicare Other | Admitting: Psychiatry

## 2016-03-17 ENCOUNTER — Encounter (HOSPITAL_COMMUNITY): Payer: Self-pay | Admitting: Psychiatry

## 2016-03-17 VITALS — BP 109/76 | HR 55 | Wt 168.8 lb

## 2016-03-17 DIAGNOSIS — F332 Major depressive disorder, recurrent severe without psychotic features: Secondary | ICD-10-CM

## 2016-03-17 DIAGNOSIS — Z888 Allergy status to other drugs, medicaments and biological substances status: Secondary | ICD-10-CM

## 2016-03-17 MED ORDER — FLUOXETINE HCL 20 MG PO CAPS: 20.0000 mg | ORAL_CAPSULE | Freq: Every day | ORAL | 2 refills | Status: DC

## 2016-03-17 MED ORDER — FLUOXETINE HCL 40 MG PO CAPS: 40.0000 mg | ORAL_CAPSULE | Freq: Every day | ORAL | 2 refills | Status: DC

## 2016-03-17 MED ORDER — ALPRAZOLAM 1 MG PO TABS: 1.0000 mg | ORAL_TABLET | Freq: Four times a day (QID) | ORAL | 2 refills | Status: DC

## 2016-03-17 MED ORDER — TRAZODONE HCL 100 MG PO TABS: 200.0000 mg | ORAL_TABLET | Freq: Every day | ORAL | 2 refills | Status: DC
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
## 2016-03-31 ENCOUNTER — Ambulatory Visit (HOSPITAL_COMMUNITY): Payer: Self-pay | Admitting: Psychiatry

## 2016-04-07 ENCOUNTER — Telehealth: Payer: Self-pay | Admitting: *Deleted

## 2016-04-07 MED ORDER — HYDROCODONE-ACETAMINOPHEN 10-325 MG PO TABS: 1.0000 | ORAL_TABLET | Freq: Four times a day (QID) | ORAL | 0 refills | Status: DC | PRN

## 2016-04-07 NOTE — Telephone Encounter (Signed)
Okay to refill? 

## 2016-04-07 NOTE — Telephone Encounter (Signed)
Received call from patient.   Requested refill on Norco.   Ok to refill??  Last office visit 12/04/2015.  Last refill 03/11/2016.

## 2016-04-07 NOTE — Telephone Encounter (Signed)
Prescription printed and patient made aware to come to office to pick up on 04/08/2016.

## 2016-05-16 ENCOUNTER — Telehealth: Payer: Self-pay | Admitting: Family Medicine

## 2016-05-16 ENCOUNTER — Ambulatory Visit (HOSPITAL_COMMUNITY): Payer: Self-pay | Admitting: Psychiatry

## 2016-05-16 MED ORDER — HYDROCODONE-ACETAMINOPHEN 10-325 MG PO TABS: 1.0000 | ORAL_TABLET | Freq: Four times a day (QID) | ORAL | 0 refills | Status: DC | PRN

## 2016-05-16 NOTE — Telephone Encounter (Signed)
PATIENT NEEDS RX FOR HER HYDROCODONE NEEDS ASAP IF CAN GET TODAY , SHE SAYS SHE IS NOT FEELING WELL  [phone]

## 2016-05-16 NOTE — Telephone Encounter (Signed)
MD made aware and prescription approved.   Prescription printed and patient made aware to come to office to pick up after 4pm on 05/16/2016.

## 2016-05-19 ENCOUNTER — Ambulatory Visit (HOSPITAL_COMMUNITY): Payer: Self-pay | Admitting: Psychiatry

## 2016-05-30 ENCOUNTER — Encounter (HOSPITAL_COMMUNITY): Payer: Self-pay

## 2016-05-30 ENCOUNTER — Ambulatory Visit (HOSPITAL_COMMUNITY): Payer: Self-pay | Admitting: Psychiatry

## 2016-06-03 ENCOUNTER — Encounter (HOSPITAL_COMMUNITY): Payer: Self-pay | Admitting: Psychiatry

## 2016-06-03 ENCOUNTER — Ambulatory Visit (INDEPENDENT_AMBULATORY_CARE_PROVIDER_SITE_OTHER): Payer: Medicare Other | Admitting: Psychiatry

## 2016-06-03 VITALS — BP 131/91 | HR 72 | Ht 63.0 in | Wt 168.6 lb

## 2016-06-03 DIAGNOSIS — Z9889 Other specified postprocedural states: Secondary | ICD-10-CM | POA: Diagnosis not present

## 2016-06-03 DIAGNOSIS — F332 Major depressive disorder, recurrent severe without psychotic features: Secondary | ICD-10-CM

## 2016-06-03 DIAGNOSIS — F4310 Post-traumatic stress disorder, unspecified: Secondary | ICD-10-CM

## 2016-06-03 DIAGNOSIS — Z9049 Acquired absence of other specified parts of digestive tract: Secondary | ICD-10-CM | POA: Diagnosis not present

## 2016-06-03 DIAGNOSIS — F39 Unspecified mood [affective] disorder: Secondary | ICD-10-CM | POA: Diagnosis not present

## 2016-06-03 DIAGNOSIS — Z888 Allergy status to other drugs, medicaments and biological substances status: Secondary | ICD-10-CM | POA: Diagnosis not present

## 2016-06-03 MED ORDER — TRAZODONE HCL 100 MG PO TABS: 200.0000 mg | ORAL_TABLET | Freq: Every day | ORAL | 2 refills | Status: DC

## 2016-06-03 MED ORDER — FLUOXETINE HCL 40 MG PO CAPS: 40.0000 mg | ORAL_CAPSULE | Freq: Two times a day (BID) | ORAL | 2 refills | Status: DC

## 2016-06-03 MED ORDER — ALPRAZOLAM 1 MG PO TABS: 1.0000 mg | ORAL_TABLET | Freq: Four times a day (QID) | ORAL | 2 refills | Status: DC
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
## 2016-06-12 ENCOUNTER — Other Ambulatory Visit: Payer: Self-pay | Admitting: Family Medicine

## 2016-06-12 NOTE — Telephone Encounter (Signed)
Calling to get refill Hydrocodone.  LRF 05/16/16  #120   LOV 12/04/15  OK refill?

## 2016-06-12 NOTE — Telephone Encounter (Signed)
Okay to refill? 

## 2016-06-13 ENCOUNTER — Telehealth: Payer: Self-pay | Admitting: *Deleted

## 2016-06-13 MED ORDER — TRAZODONE HCL 100 MG PO TABS: 200.0000 mg | ORAL_TABLET | Freq: Every day | ORAL | 2 refills | Status: DC

## 2016-06-13 MED ORDER — HYDROCODONE-ACETAMINOPHEN 10-325 MG PO TABS: 1.0000 | ORAL_TABLET | Freq: Four times a day (QID) | ORAL | 0 refills | Status: DC | PRN

## 2016-06-13 NOTE — Telephone Encounter (Signed)
Received call from patient inquiring as to when prescription will be ready.   Noted following documentation in chart: June 12, 2016  Alycia Rossetti, MD to Olena Mater, LPN  Note    Okay to refill     Olena Mater, LPN  ote    Calling to get refill Hydrocodone.  LRF 05/16/16  #120   LOV 12/04/15  OK refill?     Fellow MD made aware and agreed to sign off on prescription.   Prescription printed and patient made aware to come to office to pick up after 3pm on 06/13/2016.

## 2016-06-20 ENCOUNTER — Ambulatory Visit (HOSPITAL_COMMUNITY): Payer: Self-pay | Admitting: Psychiatry

## 2016-07-02 ENCOUNTER — Telehealth (HOSPITAL_COMMUNITY): Payer: Self-pay | Admitting: *Deleted

## 2016-07-02 ENCOUNTER — Encounter (HOSPITAL_COMMUNITY): Payer: Self-pay | Admitting: Psychiatry

## 2016-07-02 ENCOUNTER — Ambulatory Visit (INDEPENDENT_AMBULATORY_CARE_PROVIDER_SITE_OTHER): Payer: Medicare Other | Admitting: Psychiatry

## 2016-07-02 VITALS — BP 144/106 | HR 75 | Ht 63.0 in | Wt 166.4 lb

## 2016-07-02 DIAGNOSIS — F4310 Post-traumatic stress disorder, unspecified: Secondary | ICD-10-CM

## 2016-07-02 DIAGNOSIS — F332 Major depressive disorder, recurrent severe without psychotic features: Secondary | ICD-10-CM

## 2016-07-02 DIAGNOSIS — Z818 Family history of other mental and behavioral disorders: Secondary | ICD-10-CM | POA: Diagnosis not present

## 2016-07-02 MED ORDER — TRAZODONE HCL 100 MG PO TABS: 200.0000 mg | ORAL_TABLET | Freq: Every day | ORAL | 2 refills | Status: DC

## 2016-07-02 NOTE — Telephone Encounter (Signed)
Patient assistance form for Latuda was provided to pt to take home and complete pt potion and bring it back to office for office to complete provider's portion and fax to [facility] Patient Assistant.
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
## 2016-07-03 ENCOUNTER — Other Ambulatory Visit (HOSPITAL_COMMUNITY): Payer: Self-pay | Admitting: Psychiatry

## 2016-07-08 ENCOUNTER — Ambulatory Visit (INDEPENDENT_AMBULATORY_CARE_PROVIDER_SITE_OTHER): Payer: Medicare Other | Admitting: Psychiatry

## 2016-07-08 ENCOUNTER — Ambulatory Visit (HOSPITAL_COMMUNITY): Payer: Self-pay | Admitting: Psychiatry

## 2016-07-08 DIAGNOSIS — F332 Major depressive disorder, recurrent severe without psychotic features: Secondary | ICD-10-CM | POA: Diagnosis not present
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
## 2016-07-10 ENCOUNTER — Telehealth: Payer: Self-pay | Admitting: Family Medicine

## 2016-07-10 NOTE — Telephone Encounter (Signed)
Ok to refill??  Last office visit 12/04/2015.  Last refill 06/13/2016.

## 2016-07-10 NOTE — Telephone Encounter (Signed)
Pt needs refill on hydrocodone.  °

## 2016-07-11 MED ORDER — HYDROCODONE-ACETAMINOPHEN 10-325 MG PO TABS: 1.0000 | ORAL_TABLET | Freq: Four times a day (QID) | ORAL | 0 refills | Status: DC | PRN

## 2016-07-11 NOTE — Telephone Encounter (Signed)
okay

## 2016-07-11 NOTE — Telephone Encounter (Signed)
Prescription printed and patient made aware to come to office to pick up.  

## 2016-07-23 ENCOUNTER — Telehealth (HOSPITAL_COMMUNITY): Payer: Self-pay | Admitting: *Deleted

## 2016-07-23 MED ORDER — ALPRAZOLAM 1 MG PO TABS: 1.0000 mg | ORAL_TABLET | Freq: Four times a day (QID) | ORAL | 0 refills | Status: DC

## 2016-07-23 NOTE — Telephone Encounter (Signed)
Pt called stating she was cleaning her house yesterday and throwing away things and she thinks she threw away her Xanax QID. Per pt she really needs this medication. Per pt chart, medication was last filled 06-03-16 with 120 tabs 2 refills. Pt was last seen 07-02-2016 and her next appt is 07-31-2016. Pt number is [phone].

## 2016-07-23 NOTE — Telephone Encounter (Signed)
You may call in enough to last until appt 

## 2016-07-23 NOTE — Telephone Encounter (Signed)
Per [name] to call in pt Xanax to her pharmacy to last her until her f/u appt. Called pt pharmacy and spoke with Maries.

## 2016-07-23 NOTE — Telephone Encounter (Signed)
Per [name] to call in pt Xanax to her pharmacy to last her until her f/u appt. Called pt pharmacy and spoke with Opp.

## 2016-07-29 ENCOUNTER — Telehealth (HOSPITAL_COMMUNITY): Payer: Self-pay | Admitting: *Deleted

## 2016-07-29 ENCOUNTER — Ambulatory Visit (HOSPITAL_COMMUNITY): Payer: Self-pay | Admitting: Psychiatry

## 2016-07-29 NOTE — Telephone Encounter (Signed)
Pt called stating she would like to cancel her appt for today with Peggy. Pt stated she have the runs and do not want provider to catch it. Pt did not make f/u appt. Per pt chart, pt no showed with Peggy on 07-29-2016, 07-08-2016, 07-26-2016, 05-30-2016, 05-19-2016, 07-11-2015, 10-02-2015, 10-31-2015, 11-14-2015, 10-31-2015, 10-02-2015, 10-26-2014, 07-06-2014, 03-22-2014, 02-17-2014, 10-03-2013, 09-06-2013, 07-05-2013, 06-22-2013, 06-01-2013. Per pt chart, pt also no showed with [name] on 05-30-2016, 08-09-2015, 05-30-2015, 06-21-2014, 06-08-2014, 03-16-2014, 09-01-2013, 03-16-2014, 07-06-2013. Please advise as to what staff should do due to no show policy when pt calls office back to resch appts with both Maurice Small and [name].

## 2016-07-30 ENCOUNTER — Encounter (HOSPITAL_COMMUNITY): Payer: Self-pay | Admitting: *Deleted

## 2016-07-30 ENCOUNTER — Telehealth (HOSPITAL_COMMUNITY): Payer: Self-pay | Admitting: *Deleted

## 2016-07-30 NOTE — Telephone Encounter (Signed)
phone call from patient, said she just spoke with [facility] and wants her to call her back.

## 2016-07-30 NOTE — Telephone Encounter (Signed)
Pt called 07-30-2016 at 8:18 am to cancel her appt with [name]. Per pt, she had pushed yes when the appt reminder called. Per pt she will not be able to come to appt today due to not wanting the providers to catch the runs like she currently have. Per pt, she will contact her PCP. Staff asked pt yesterday to call her PCP and asked pt if she did and she stated no, she thought she could get better on her own. Per pt chart, pt no showed for [name] on 07-30-2016, 05-30-2016, 08-09-2015, 05-30-2015.

## 2016-07-30 NOTE — Telephone Encounter (Signed)
She can continue to see me

## 2016-07-30 NOTE — Telephone Encounter (Signed)
Do not schedule any more appointments with me due to excessive No Shows, please send the 30 day letter. Thanks.

## 2016-07-31 ENCOUNTER — Ambulatory Visit (INDEPENDENT_AMBULATORY_CARE_PROVIDER_SITE_OTHER): Payer: Medicare Other | Admitting: Psychiatry

## 2016-07-31 ENCOUNTER — Encounter (HOSPITAL_COMMUNITY): Payer: Self-pay | Admitting: Psychiatry

## 2016-07-31 VITALS — BP 146/84 | HR 60 | Ht 63.0 in | Wt 168.4 lb

## 2016-07-31 DIAGNOSIS — F332 Major depressive disorder, recurrent severe without psychotic features: Secondary | ICD-10-CM

## 2016-07-31 MED ORDER — FLUOXETINE HCL 40 MG PO CAPS
40.0000 mg | ORAL_CAPSULE | Freq: Two times a day (BID) | ORAL | 2 refills | Status: DC
Start: 2016-07-31 — End: 2016-09-02

## 2016-07-31 MED ORDER — TRAZODONE HCL 100 MG PO TABS: 200.0000 mg | ORAL_TABLET | Freq: Every day | ORAL | 2 refills | Status: DC
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
## 2016-08-04 ENCOUNTER — Encounter (HOSPITAL_COMMUNITY): Payer: Self-pay | Admitting: *Deleted

## 2016-08-04 NOTE — Telephone Encounter (Signed)
Spoke with pt and informed her that RMA will have to call her back due to pt asking if her appt with Vickii Chafe was for 08-12-2016. Pt verbalized understanding.

## 2016-08-04 NOTE — Telephone Encounter (Signed)
Noted. D/C noted mailed to patient home on 08-04-2016.

## 2016-08-04 NOTE — Telephone Encounter (Signed)
D/C note that was mailed to pt home was for [employer].

## 2016-08-11 ENCOUNTER — Telehealth: Payer: Self-pay | Admitting: Family Medicine

## 2016-08-11 MED ORDER — HYDROCODONE-ACETAMINOPHEN 10-325 MG PO TABS: 1.0000 | ORAL_TABLET | Freq: Four times a day (QID) | ORAL | 0 refills | Status: DC | PRN

## 2016-08-11 NOTE — Telephone Encounter (Signed)
Prescription printed and patient made aware to come to office to pick up after 3pm on 08/11/2016.

## 2016-08-11 NOTE — Telephone Encounter (Signed)
Pt needs refill on hydrocodone.  °

## 2016-08-11 NOTE — Telephone Encounter (Signed)
Ok to refill??  Last office visit 12/14/2015.  Last refill 07/11/2016.

## 2016-08-11 NOTE — Telephone Encounter (Signed)
Okay to refill? 

## 2016-08-12 ENCOUNTER — Ambulatory Visit (HOSPITAL_COMMUNITY): Payer: Self-pay | Admitting: Psychiatry

## 2016-08-21 ENCOUNTER — Encounter (HOSPITAL_COMMUNITY): Payer: Self-pay | Admitting: *Deleted

## 2016-08-27 ENCOUNTER — Ambulatory Visit (HOSPITAL_COMMUNITY): Payer: Self-pay | Admitting: Psychiatry

## 2016-09-02 ENCOUNTER — Ambulatory Visit (INDEPENDENT_AMBULATORY_CARE_PROVIDER_SITE_OTHER): Payer: Medicare Other | Admitting: Psychiatry

## 2016-09-02 ENCOUNTER — Encounter (HOSPITAL_COMMUNITY): Payer: Self-pay | Admitting: Psychiatry

## 2016-09-02 VITALS — BP 109/73 | HR 63 | Ht 63.0 in | Wt 164.0 lb

## 2016-09-02 DIAGNOSIS — Z818 Family history of other mental and behavioral disorders: Secondary | ICD-10-CM

## 2016-09-02 DIAGNOSIS — F39 Unspecified mood [affective] disorder: Secondary | ICD-10-CM | POA: Diagnosis not present

## 2016-09-02 DIAGNOSIS — F332 Major depressive disorder, recurrent severe without psychotic features: Secondary | ICD-10-CM | POA: Diagnosis not present

## 2016-09-02 DIAGNOSIS — F4310 Post-traumatic stress disorder, unspecified: Secondary | ICD-10-CM

## 2016-09-02 DIAGNOSIS — Z79899 Other long term (current) drug therapy: Secondary | ICD-10-CM

## 2016-09-02 MED ORDER — TRAZODONE HCL 100 MG PO TABS: 200.0000 mg | ORAL_TABLET | Freq: Every day | ORAL | 2 refills | Status: DC

## 2016-09-02 MED ORDER — ALPRAZOLAM 1 MG PO TABS: 1.0000 mg | ORAL_TABLET | Freq: Four times a day (QID) | ORAL | 2 refills | Status: DC

## 2016-09-02 MED ORDER — BREXPIPRAZOLE 0.5 MG PO TABS: 0.5000 mg | ORAL_TABLET | Freq: Every day | ORAL | 2 refills | Status: DC
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
## 2016-09-10 ENCOUNTER — Telehealth (HOSPITAL_COMMUNITY): Payer: Self-pay | Admitting: *Deleted

## 2016-09-10 NOTE — Telephone Encounter (Signed)
Received fax from office stating prior authorization is needed for [place]. Called pharmacy to inquire. Was told that they do not see rejection. It went through and is covered by insurance but the copay is $320.76. Will notify MD.

## 2016-09-12 ENCOUNTER — Telehealth: Payer: Self-pay | Admitting: Family Medicine

## 2016-09-12 MED ORDER — HYDROCODONE-ACETAMINOPHEN 10-325 MG PO TABS: 1.0000 | ORAL_TABLET | Freq: Four times a day (QID) | ORAL | 0 refills | Status: DC | PRN

## 2016-09-12 NOTE — Telephone Encounter (Signed)
Ok to refill Hydrocodone??  Last office visit 12/04/2015.  Last refill 08/11/2016.

## 2016-09-12 NOTE — Telephone Encounter (Signed)
PATIENT WOULD LIKE TO COME BACK AFTER TWO TO GET HER RX FOR PAIN MED

## 2016-09-12 NOTE — Telephone Encounter (Signed)
Prescription printed and patient made aware to come to office to pick up on 09/12/2016 after 2pm.   Appointment scheduled for 10/10/2016.

## 2016-09-12 NOTE — Telephone Encounter (Signed)
Needs OV Give 1 refill

## 2016-09-18 NOTE — Telephone Encounter (Signed)
noted 

## 2016-10-02 ENCOUNTER — Ambulatory Visit (INDEPENDENT_AMBULATORY_CARE_PROVIDER_SITE_OTHER): Payer: Medicare Other | Admitting: Psychiatry

## 2016-10-02 ENCOUNTER — Encounter (HOSPITAL_COMMUNITY): Payer: Self-pay | Admitting: Psychiatry

## 2016-10-02 VITALS — BP 142/98 | HR 83 | Ht 63.0 in | Wt 161.2 lb

## 2016-10-02 DIAGNOSIS — F4310 Post-traumatic stress disorder, unspecified: Secondary | ICD-10-CM

## 2016-10-02 DIAGNOSIS — Z818 Family history of other mental and behavioral disorders: Secondary | ICD-10-CM | POA: Diagnosis not present

## 2016-10-02 DIAGNOSIS — F39 Unspecified mood [affective] disorder: Secondary | ICD-10-CM

## 2016-10-02 DIAGNOSIS — F332 Major depressive disorder, recurrent severe without psychotic features: Secondary | ICD-10-CM

## 2016-10-02 DIAGNOSIS — F1210 Cannabis abuse, uncomplicated: Secondary | ICD-10-CM | POA: Diagnosis not present

## 2016-10-02 DIAGNOSIS — Z79899 Other long term (current) drug therapy: Secondary | ICD-10-CM

## 2016-10-02 MED ORDER — TRAZODONE HCL 100 MG PO TABS: 200.0000 mg | ORAL_TABLET | Freq: Every day | ORAL | 2 refills | Status: DC

## 2016-10-02 MED ORDER — ALPRAZOLAM 1 MG PO TABS: 1.0000 mg | ORAL_TABLET | Freq: Four times a day (QID) | ORAL | 2 refills | Status: DC
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
## 2016-10-05 ENCOUNTER — Emergency Department (HOSPITAL_COMMUNITY)
Admission: EM | Admit: 2016-10-05 | Discharge: 2016-10-06 | Disposition: A | Discharge status: Other Acute Inpatient Hospital | Payer: Medicare Other | Attending: Emergency Medicine | Admitting: Emergency Medicine

## 2016-10-05 ENCOUNTER — Encounter (HOSPITAL_COMMUNITY): Payer: Self-pay | Admitting: *Deleted

## 2016-10-05 DIAGNOSIS — F17210 Nicotine dependence, cigarettes, uncomplicated: Secondary | ICD-10-CM | POA: Insufficient documentation

## 2016-10-05 DIAGNOSIS — R45851 Suicidal ideations: Secondary | ICD-10-CM | POA: Diagnosis not present

## 2016-10-05 DIAGNOSIS — F329 Major depressive disorder, single episode, unspecified: Secondary | ICD-10-CM | POA: Diagnosis not present

## 2016-10-05 DIAGNOSIS — Z79899 Other long term (current) drug therapy: Secondary | ICD-10-CM | POA: Insufficient documentation

## 2016-10-05 DIAGNOSIS — F32A Depression, unspecified: Secondary | ICD-10-CM

## 2016-10-05 LAB — CBC WITH DIFFERENTIAL/PLATELET
Basophils Absolute: 0 10*3/uL (ref 0.0–0.1)
Basophils Relative: 0 %
EOS PCT: 1 %
Eosinophils Absolute: 0 10*3/uL (ref 0.0–0.7)
HCT: 36.5 % (ref 36.0–46.0)
Hemoglobin: 12.2 g/dL (ref 12.0–15.0)
LYMPHS ABS: 0.9 10*3/uL (ref 0.7–4.0)
LYMPHS PCT: 21 %
MCH: 29.8 pg (ref 26.0–34.0)
MCHC: 33.4 g/dL (ref 30.0–36.0)
MCV: 89.2 fL (ref 78.0–100.0)
Monocytes Absolute: 0.3 10*3/uL (ref 0.1–1.0)
Monocytes Relative: 7 %
NEUTROS ABS: 2.9 10*3/uL (ref 1.7–7.7)
Neutrophils Relative %: 71 %
PLATELETS: 118 10*3/uL — AB (ref 150–400)
RBC: 4.09 MIL/uL (ref 3.87–5.11)
RDW: 13.7 % (ref 11.5–15.5)
WBC: 4.1 10*3/uL (ref 4.0–10.5)

## 2016-10-05 LAB — BASIC METABOLIC PANEL
Anion gap: 8 (ref 5–15)
BUN: 10 mg/dL (ref 6–20)
CHLORIDE: 104 mmol/L (ref 101–111)
CO2: 27 mmol/L (ref 22–32)
CREATININE: 0.75 mg/dL (ref 0.44–1.00)
Calcium: 9.1 mg/dL (ref 8.9–10.3)
GFR calc non Af Amer: >60 mL/min (ref >60)
Glucose, Bld: 122 mg/dL — ABNORMAL HIGH (ref 65–99)
Potassium: 3.2 mmol/L — ABNORMAL LOW (ref 3.5–5.1)
Sodium: 139 mmol/L (ref 135–145)

## 2016-10-05 LAB — RAPID URINE DRUG SCREEN, HOSP PERFORMED
AMPHETAMINES: NOT DETECTED
BARBITURATES: NOT DETECTED
BENZODIAZEPINES: POSITIVE — AB
Cocaine: NOT DETECTED
Opiates: NOT DETECTED
TETRAHYDROCANNABINOL: POSITIVE — AB

## 2016-10-05 LAB — ETHANOL: Alcohol, Ethyl (B): <5 mg/dL (ref <5)

## 2016-10-05 MED ORDER — POTASSIUM CHLORIDE CRYS ER 20 MEQ PO TBCR
40.0000 meq | EXTENDED_RELEASE_TABLET | Freq: Once | ORAL | Status: AC
  Administered 2016-10-05: 40 meq via ORAL
  Filled 2016-10-05: qty 2

## 2016-10-05 MED ORDER — LORAZEPAM 1 MG PO TABS
1.0000 mg | ORAL_TABLET | Freq: Once | ORAL | Status: AC
  Administered 2016-10-05: 1 mg via ORAL
  Filled 2016-10-05: qty 1

## 2016-10-05 MED ORDER — VITAMIN C 500 MG PO TABS
500.0000 mg | ORAL_TABLET | Freq: Every day | ORAL | Status: DC
  Administered 2016-10-06: 500 mg via ORAL
  Filled 2016-10-05 (×4): qty 1

## 2016-10-05 MED ORDER — ADULT MULTIVITAMIN W/MINERALS CH
1.0000 | ORAL_TABLET | Freq: Every day | ORAL | Status: DC
  Administered 2016-10-06: 1 via ORAL
  Filled 2016-10-05: qty 1

## 2016-10-05 MED ORDER — ASPIRIN-ACETAMINOPHEN-CAFFEINE 250-250-65 MG PO TABS
2.0000 | ORAL_TABLET | Freq: Four times a day (QID) | ORAL | Status: DC | PRN
  Filled 2016-10-05: qty 2

## 2016-10-05 MED ORDER — HYDROCODONE-ACETAMINOPHEN 10-325 MG PO TABS
1.0000 | ORAL_TABLET | Freq: Four times a day (QID) | ORAL | Status: DC | PRN
  Administered 2016-10-05 – 2016-10-06 (×2): 1 via ORAL
  Filled 2016-10-05 (×2): qty 1

## 2016-10-05 MED ORDER — ALPRAZOLAM 0.5 MG PO TABS
1.0000 mg | ORAL_TABLET | Freq: Four times a day (QID) | ORAL | Status: DC
  Administered 2016-10-05 – 2016-10-06 (×2): 1 mg via ORAL
  Filled 2016-10-05 (×2): qty 2

## 2016-10-05 MED ORDER — TRAZODONE HCL 50 MG PO TABS
200.0000 mg | ORAL_TABLET | Freq: Every day | ORAL | Status: DC
  Administered 2016-10-05: 200 mg via ORAL
  Filled 2016-10-05: qty 4

## 2016-10-05 MED ORDER — BREXPIPRAZOLE 0.5 MG PO TABS: 0.5000 mg | ORAL_TABLET | Freq: Every day | ORAL | Status: DC

## 2016-10-05 NOTE — ED Notes (Signed)
avi- assist placed in room.

## 2016-10-05 NOTE — Progress Notes (Signed)
Pt has been faxed to the following inpt facilities for possible admission:  [facility], [address] [place], [place], [place], [place], [place], [facility]  Lind Covert, MSW, [employer] TTS Specialist [phone]

## 2016-10-05 NOTE — ED Provider Notes (Signed)
[facility] DEPT Provider Note   CSN: [id number] Arrival date & time: 10/05/16  1223     History   Chief Complaint Chief Complaint  Patient presents with  . Depression    HPI Kathleen Palmer is a 56 y.o. female.  HPI  Pt was seen at 1235. Per pt and her family, c/o gradual onset and worsening of persistent depression for the past 1 month. Family states pt "just keeps crying." States her meds were changed approximately 2 weeks ago without change in her symptoms. States she stopped seeing her outpatient therapist because "they weren't doing anything for me." Pt endorses SI yesterday, without a plan. Denies SA, no HI, no hallucinations.    Past Medical History:  Diagnosis Date  . Angiomyolipoma    Left kidney  . Anxiety   . Arthritis   . Attention to urostomy [facility])   . Chronic pain   . Depression   . Hepatitis C    ? Contracted through IVDA  . Hepatitis C   . Panic 12/12/1967  . Panic 04/29/1999  . PTSD (post-traumatic stress disorder)   . Substance abuse    Remote history- cocaine, ETOH, Marijuana  . Thyroid disease     Patient Active Problem List   Diagnosis Date Noted  . Family hx of ALS (amyotrophic lateral sclerosis) 08/31/2015  . Chronic hepatitis C ([place]) 01/24/2014  . Osteopenia 12/02/2013  . OA (osteoarthritis) of knee 09/20/2013  . Clitoral irritation 08/01/2013  . Epidermoid cyst of skin 08/01/2013  . Atypical chest pain 03/25/2013  . Colon cancer screening 03/25/2013  . Knee pain 03/25/2013  . Marijuana use 07/20/2012  . Unspecified vitamin D deficiency 06/02/2012  . Pain 04/07/2012  . Elevated blood pressure (not hypertension) 04/06/2012  . 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 Hepatitis C 02/06/2012  . Subclinical hypothyroidism 02/06/2012  . Chronic pain syndrome 02/06/2012  . H/O vaginal surgery 02/06/2012  . 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 Ileostomy status ([place]) 02/06/2012  . 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 PTSD (post-traumatic  stress disorder) 02/06/2012  . MDD (major depressive disorder) ([place]) 02/06/2012    Past Surgical History:  Procedure Laterality Date  . ABDOMINAL HYSTERECTOMY    . ABDOMINAL SURGERY    . CHOLECYSTECTOMY    . HERNIA REPAIR    . ILEO LOOP CONDUIT    . 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 VAGINA RECONSTRUCTION SURGERY      OB History    No data available       Home Medications    Prior to Admission medications   Medication Sig Start Date End Date Taking? Authorizing Provider  ALPRAZolam Duanne Moron) 1 MG tablet Take 1 tablet (1 mg total) by mouth 4 (four) times daily. 10/02/16 10/02/17  Cloria Spring, MD  aspirin-acetaminophen-caffeine (EXCEDRIN MIGRAINE) [id number] MG tablet Take 2 tablets by mouth every 6 (six) hours as needed for headache.    [provider]  Brexpiprazole (REXULTI) 0.5 MG TABS Take 1 tablet (0.5 mg total) by mouth daily. Patient not taking: Reported on 10/02/2016 09/02/16   Cloria Spring, MD  Cyanocobalamin (VITAMIN B-12 PO) Take 1 tablet by mouth daily.    [provider]  ESTRACE VAGINAL 0.1 MG/GM vaginal cream Place vaginally 3 (three) times a week.  04/01/12   [provider]  HYDROcodone-acetaminophen (NORCO) 10-325 MG tablet Take 1 tablet  by mouth every 6 (six) hours as needed for moderate pain. 09/12/16   Alycia Rossetti, MD  Multiple Vitamin (MULTIVITAMIN WITH MINERALS) TABS tablet Take 1 tablet by mouth daily.    [provider]  Naphazoline HCl (CLEAR EYES OP) Apply 2 drops to eye daily as needed (dry eyes).    [provider]  traZODone (DESYREL) 100 MG tablet Take 2 tablets (200 mg total) by mouth at bedtime. 10/02/16   Cloria Spring, MD  UNABLE TO FIND Ostomy Night Drainage Collector  Ref # [id number].  Use as directed. 12/12/14   Alycia Rossetti, MD  vitamin C (ASCORBIC ACID) 500 MG tablet Take 500  mg by mouth daily.    [provider]    Family History Family History  Problem Relation Age of Onset  . Adopted: Yes  . Depression Mother   . Depression Sister   . Anxiety disorder Sister   . Bipolar disorder Sister   . Depression Maternal Grandmother   . ADD / ADHD Neg Hx   . Alcohol abuse Neg Hx   . Drug abuse Neg Hx   . Dementia Neg Hx   . OCD Neg Hx   . Paranoid behavior Neg Hx   . Schizophrenia Neg Hx   . Seizures Neg Hx   . Sexual abuse Neg Hx   . Physical abuse Neg Hx   . Suicidality Neg Hx   . Colon cancer Neg Hx     Social History Social History  Substance Use Topics  . Smoking status: Former Smoker    Packs/day: 0.25    Years: 33.00    Types: Cigarettes    Quit date: 01/21/2015  . Smokeless tobacco: Never Used     Comment: 9-10 cigs a day as of 10/20/2012, (02-07-15 per pt, she stopped smoking 01-21-15)  . Alcohol use No     Comment: No etoh since 2004,no marijuana use since just before Christmas     Allergies   Oxycodone; Abilify [aripiprazole]; Ampicillin; Bactrim [sulfamethoxazole-trimethoprim]; and Gabapentin   Review of Systems Review of Systems ROS: Statement: All systems negative except as marked or noted in the HPI; Constitutional: Negative for fever and chills. ; ; Eyes: Negative for eye pain, redness and discharge. ; ; ENMT: Negative for ear pain, hoarseness, nasal congestion, sinus pressure and sore throat. ; ; Cardiovascular: Negative for chest pain, palpitations, diaphoresis, dyspnea and peripheral edema. ; ; Respiratory: Negative for cough, wheezing and stridor. ; ; Gastrointestinal: Negative for nausea, vomiting, diarrhea, abdominal pain, blood in stool, hematemesis, jaundice and rectal bleeding. . ; ; Genitourinary: Negative for dysuria, flank pain and hematuria. ; ; Musculoskeletal: Negative for back pain and neck pain. Negative for swelling and trauma.; ; Skin: Negative for pruritus, rash, abrasions, blisters, bruising and skin  lesion.; ; Neuro: Negative for headache, lightheadedness and neck stiffness. Negative for weakness, altered level of consciousness, altered mental status, extremity weakness, paresthesias, involuntary movement, seizure and syncope.; Psych:  +depression, +SI, no SA, no HI, no hallucinations.        Physical Exam Updated Vital Signs BP (!) 150/86 (BP Location: Right Arm)   Pulse 80   Temp 98.6 F (37 C) (Oral)   Resp 20   Ht 5\' 3"  (1.6 m)   Wt 73 kg (161 lb)   SpO2 97%   BMI 28.52 kg/m   Physical Exam 1240: Physical examination:  Nursing notes reviewed; Vital signs and O2 SAT reviewed;  Constitutional: Well developed, Well nourished, Well  hydrated, In no acute distress; Head:  Normocephalic, atraumatic; Eyes: EOMI, PERRL, No scleral icterus; ENMT: Mouth and pharynx normal, Mucous membranes moist; Neck: Supple, Full range of motion; Cardiovascular: Regular rate and rhythm; Respiratory: Breath sounds clear, No wheezes.  Speaking full sentences with ease, Normal respiratory effort/excursion; Chest: No deformity, Movement normal; Abdomen: Nondistended; Extremities: No deformity.; Neuro: AA&Ox3, Major CN grossly intact.  Speech clear. No gross focal motor deficits in extremities. Climbs on and off stretcher easily by herself. Gait steady.; Skin: Color normal, Warm, Dry.; Psych:  Crying.    ED Treatments / Results  Labs (all labs ordered are listed, but only abnormal results are displayed)   EKG  EKG Interpretation None       Radiology   Procedures Procedures (including critical care time)  Medications Ordered in ED Medications - No data to display   Initial Impression / Assessment and Plan / ED Course  I have reviewed the triage vital signs and the nursing notes.  Pertinent labs & imaging results that were available during my care of the patient were reviewed by me and considered in my medical decision making (see chart for details).  MDM Reviewed: previous chart, nursing  note and vitals Reviewed previous: labs Interpretation: labs   Results for orders placed or performed during the hospital encounter of 74/12/87  Basic metabolic panel  Result Value Ref Range   Sodium 139 135 - 145 mmol/L   Potassium 3.2 (L) 3.5 - 5.1 mmol/L   Chloride 104 101 - 111 mmol/L   CO2 27 22 - 32 mmol/L   Glucose, Bld 122 (H) 65 - 99 mg/dL   BUN 10 6 - 20 mg/dL   Creatinine, Ser 0.75 0.44 - 1.00 mg/dL   Calcium 9.1 8.9 - 10.3 mg/dL   GFR calc non Af Amer >60 >60 mL/min   GFR calc Af Amer >60 >60 mL/min   Anion gap 8 5 - 15  Ethanol  Result Value Ref Range   Alcohol, Ethyl (B) <5 <5 mg/dL  CBC with Differential  Result Value Ref Range   WBC 4.1 4.0 - 10.5 K/uL   RBC 4.09 3.87 - 5.11 MIL/uL   Hemoglobin 12.2 12.0 - 15.0 g/dL   HCT 36.5 36.0 - 46.0 %   MCV 89.2 78.0 - 100.0 fL   MCH 29.8 26.0 - 34.0 pg   MCHC 33.4 30.0 - 36.0 g/dL   RDW 13.7 11.5 - 15.5 %   Platelets 118 (L) 150 - 400 K/uL   Neutrophils Relative % 71 %   Neutro Abs 2.9 1.7 - 7.7 K/uL   Lymphocytes Relative 21 %   Lymphs Abs 0.9 0.7 - 4.0 K/uL   Monocytes Relative 7 %   Monocytes Absolute 0.3 0.1 - 1.0 K/uL   Eosinophils Relative 1 %   Eosinophils Absolute 0.0 0.0 - 0.7 K/uL   Basophils Relative 0 %   Basophils Absolute 0.0 0.0 - 0.1 K/uL  Urine rapid drug screen (hosp performed)  Result Value Ref Range   Opiates NONE DETECTED NONE DETECTED   Cocaine NONE DETECTED NONE DETECTED   Benzodiazepines POSITIVE (A) NONE DETECTED   Amphetamines NONE DETECTED NONE DETECTED   Tetrahydrocannabinol POSITIVE (A) NONE DETECTED   Barbiturates NONE DETECTED NONE DETECTED    1435:  Pt continues tearful.  Pt requesting a dose of her usual xanax. TTS consult pending.   1730:  TTS has evaluated pt: states pt will need re-evaluation in the morning. Holding orders written.  Final Clinical Impressions(s) / ED Diagnoses   Final diagnoses:  None    New Prescriptions New Prescriptions   No medications  on file     Francine Graven, DO 10/05/16 2018

## 2016-10-05 NOTE — ED Triage Notes (Signed)
Pt c/o depression "flare up". Pt reports she has been crying for a month. Pt takes depression medication and has been taking it as prescribed. Pt had suicidal thoughts yesterday, but denies today. Denies suicidal plan yesterday.

## 2016-10-05 NOTE — ED Notes (Signed)
Pt advised that she is staying over night and will be reassessed in the morning.  Pt calm and cooperative at this time.  Continues to deny SI or HI.  States I have a strong faith and I would never hurt myself.

## 2016-10-05 NOTE — ED Notes (Signed)
Pt states she has an ilieostomy and is ok going to the bathroom and emptying it herself.

## 2016-10-05 NOTE — ED Notes (Signed)
Pt on and off crying in room.  Keeps coming to the door and watching staff and speaking to them when they walk by.  States it is time for her usual xanax and she needs it.  Dr. Thurnell Garbe notified.

## 2016-10-05 NOTE — ED Notes (Signed)
C/o pelvic pain.  Requesting home pain medication.
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
## 2016-10-06 DIAGNOSIS — F329 Major depressive disorder, single episode, unspecified: Secondary | ICD-10-CM | POA: Diagnosis not present

## 2016-10-06 MED ORDER — ALPRAZOLAM 0.5 MG PO TABS
1.0000 mg | ORAL_TABLET | Freq: Once | ORAL | Status: AC
  Administered 2016-10-06: 1 mg via ORAL
  Filled 2016-10-06: qty 2

## 2016-10-06 MED ORDER — ASPIRIN 325 MG PO TABS: 650.0000 mg | ORAL_TABLET | Freq: Four times a day (QID) | ORAL | Status: DC | PRN

## 2016-10-06 MED ORDER — ACETAMINOPHEN 325 MG PO TABS: 650.0000 mg | ORAL_TABLET | Freq: Four times a day (QID) | ORAL | Status: DC | PRN

## 2016-10-06 NOTE — Progress Notes (Addendum)
CSW left message for intake coordinator Tiffany at [phone] regarding bed assignment. CSW awaiting return call.  Adriana Reams, LCSW Clinical Social Work [phone]

## 2016-10-06 NOTE — ED Notes (Signed)
Pt ambulatory to restroom

## 2016-10-06 NOTE — ED Notes (Signed)
Meal tray given 

## 2016-10-06 NOTE — ED Notes (Signed)
[name] spouse brought patient's medication, brexpiprazole, and gave to this nurse. Medication sent to pharmacy.

## 2016-10-06 NOTE — ED Notes (Signed)
Pt's husband in room

## 2016-10-06 NOTE — ED Notes (Signed)
This nurse received a call from [facility] nurse requesting an update on pts potassium level. Nurse informed pt was given 44meq of potassium. No new orders at this time. The nurse stated they would speak with their doctors regarded acceptance and call back with update.

## 2016-10-06 NOTE — Progress Notes (Signed)
Pt accepted to [facility]; accepting is Dr. Marisa Hua, MD.  Call report to [phone].  Adriana Reams, LCSW Clinical Social Work [phone]

## 2016-10-06 NOTE — ED Provider Notes (Signed)
  Physical Exam  BP 126/75 (BP Location: Right Arm)   Pulse 73   Temp 98.6 F (37 C) (Oral)   Resp 18   Ht 5\' 3"  (1.6 m)   Wt 73 kg (161 lb)   SpO2 95%   BMI 28.52 kg/m   Physical Exam  ED Course  Procedures  MDM Accepted at [facility] by Dr Ammie Dalton. Reevaluated       Davonna Belling, MD 10/06/16 1053

## 2016-10-06 NOTE — Progress Notes (Signed)
Kathleen Palmer with [facility] called to report the pt has been accepted contingent upon the pt being IVC'd due to distance and concerns if pt remains voluntary. Accepting is Dr. Marisa Hua, MD.  Call at 09:30am to obtain bed assignment and give report to [phone]. Legrand Como, RN notified of the acceptance and the need for the pt to be IVC'd. Legrand Como, RN to discuss with EDP if they are willing to IVC the pt for acceptance to [facility].  Lind Covert, MSW, [place] TTS Specialist [phone]

## 2016-10-07 DIAGNOSIS — F4310 Post-traumatic stress disorder, unspecified: Secondary | ICD-10-CM | POA: Diagnosis not present

## 2016-10-07 DIAGNOSIS — F410 Panic disorder [episodic paroxysmal anxiety] without agoraphobia: Secondary | ICD-10-CM | POA: Diagnosis not present

## 2016-10-07 DIAGNOSIS — F332 Major depressive disorder, recurrent severe without psychotic features: Secondary | ICD-10-CM | POA: Diagnosis not present

## 2016-10-09 DIAGNOSIS — F332 Major depressive disorder, recurrent severe without psychotic features: Secondary | ICD-10-CM | POA: Diagnosis not present

## 2016-10-10 ENCOUNTER — Ambulatory Visit: Payer: Self-pay | Admitting: Family Medicine

## 2016-10-10 DIAGNOSIS — F410 Panic disorder [episodic paroxysmal anxiety] without agoraphobia: Secondary | ICD-10-CM | POA: Diagnosis not present

## 2016-10-10 DIAGNOSIS — F4310 Post-traumatic stress disorder, unspecified: Secondary | ICD-10-CM | POA: Diagnosis not present

## 2016-10-10 DIAGNOSIS — F332 Major depressive disorder, recurrent severe without psychotic features: Secondary | ICD-10-CM | POA: Diagnosis not present

## 2016-10-11 DIAGNOSIS — F332 Major depressive disorder, recurrent severe without psychotic features: Secondary | ICD-10-CM | POA: Diagnosis not present

## 2016-10-12 DIAGNOSIS — F332 Major depressive disorder, recurrent severe without psychotic features: Secondary | ICD-10-CM | POA: Diagnosis not present

## 2016-10-13 DIAGNOSIS — F332 Major depressive disorder, recurrent severe without psychotic features: Secondary | ICD-10-CM | POA: Diagnosis not present

## 2016-10-16 ENCOUNTER — Ambulatory Visit (INDEPENDENT_AMBULATORY_CARE_PROVIDER_SITE_OTHER): Payer: Medicare Other | Admitting: Psychiatry

## 2016-10-16 ENCOUNTER — Encounter (HOSPITAL_COMMUNITY): Payer: Self-pay | Admitting: Psychiatry

## 2016-10-16 VITALS — BP 140/94 | HR 70 | Ht 63.0 in | Wt 163.0 lb

## 2016-10-16 DIAGNOSIS — Z885 Allergy status to narcotic agent status: Secondary | ICD-10-CM | POA: Diagnosis not present

## 2016-10-16 DIAGNOSIS — F1210 Cannabis abuse, uncomplicated: Secondary | ICD-10-CM

## 2016-10-16 DIAGNOSIS — Z79899 Other long term (current) drug therapy: Secondary | ICD-10-CM | POA: Diagnosis not present

## 2016-10-16 DIAGNOSIS — F4310 Post-traumatic stress disorder, unspecified: Secondary | ICD-10-CM

## 2016-10-16 DIAGNOSIS — Z881 Allergy status to other antibiotic agents status: Secondary | ICD-10-CM | POA: Diagnosis not present

## 2016-10-16 DIAGNOSIS — G8929 Other chronic pain: Secondary | ICD-10-CM | POA: Diagnosis not present

## 2016-10-16 DIAGNOSIS — F39 Unspecified mood [affective] disorder: Secondary | ICD-10-CM | POA: Diagnosis not present

## 2016-10-16 DIAGNOSIS — F603 Borderline personality disorder: Secondary | ICD-10-CM

## 2016-10-16 DIAGNOSIS — Z888 Allergy status to other drugs, medicaments and biological substances status: Secondary | ICD-10-CM

## 2016-10-16 DIAGNOSIS — F332 Major depressive disorder, recurrent severe without psychotic features: Secondary | ICD-10-CM

## 2016-10-16 DIAGNOSIS — F329 Major depressive disorder, single episode, unspecified: Secondary | ICD-10-CM | POA: Diagnosis not present

## 2016-10-16 MED ORDER — DULOXETINE HCL 20 MG PO CPEP: 40.0000 mg | ORAL_CAPSULE | Freq: Two times a day (BID) | ORAL | 2 refills | Status: DC
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
## 2016-10-17 ENCOUNTER — Encounter: Payer: Self-pay | Admitting: Family Medicine

## 2016-10-17 ENCOUNTER — Ambulatory Visit (INDEPENDENT_AMBULATORY_CARE_PROVIDER_SITE_OTHER): Payer: Medicare Other | Admitting: Family Medicine

## 2016-10-17 VITALS — BP 118/84 | HR 94 | Temp 98.8°F | Resp 16 | Wt 162.6 lb

## 2016-10-17 DIAGNOSIS — Z932 Ileostomy status: Secondary | ICD-10-CM

## 2016-10-17 DIAGNOSIS — F333 Major depressive disorder, recurrent, severe with psychotic symptoms: Secondary | ICD-10-CM

## 2016-10-17 DIAGNOSIS — G894 Chronic pain syndrome: Secondary | ICD-10-CM

## 2016-10-17 DIAGNOSIS — Q6410 Exstrophy of urinary bladder, unspecified: Secondary | ICD-10-CM

## 2016-10-17 DIAGNOSIS — F1290 Cannabis use, unspecified, uncomplicated: Secondary | ICD-10-CM

## 2016-10-17 DIAGNOSIS — B182 Chronic viral hepatitis C: Secondary | ICD-10-CM

## 2016-10-17 DIAGNOSIS — M25551 Pain in right hip: Secondary | ICD-10-CM

## 2016-10-17 LAB — CBC WITH DIFFERENTIAL/PLATELET
BASOS PCT: 0 %
Basophils Absolute: 0 cells/uL (ref 0–200)
EOS ABS: 35 {cells}/uL (ref 15–500)
Eosinophils Relative: 1 %
HCT: 39.6 % (ref 35.0–45.0)
Hemoglobin: 13.1 g/dL (ref 12.0–15.0)
LYMPHS PCT: 24 %
Lymphs Abs: 840 cells/uL — ABNORMAL LOW (ref 850–3900)
MCH: 28.9 pg (ref 27.0–33.0)
MCHC: 33.1 g/dL (ref 32.0–36.0)
MCV: 87.4 fL (ref 80.0–100.0)
MPV: 11.8 fL (ref 7.5–12.5)
Monocytes Absolute: 350 cells/uL (ref 200–950)
Monocytes Relative: 10 %
Neutro Abs: 2275 cells/uL (ref 1500–7800)
Neutrophils Relative %: 65 %
PLATELETS: 112 10*3/uL — AB (ref 140–400)
RBC: 4.53 MIL/uL (ref 3.80–5.10)
RDW: 14.4 % (ref 11.0–15.0)
WBC: 3.5 10*3/uL — ABNORMAL LOW (ref 3.8–10.8)

## 2016-10-17 LAB — COMPREHENSIVE METABOLIC PANEL
ALK PHOS: 58 U/L (ref 33–130)
ALT: 68 U/L — AB (ref 6–29)
AST: 70 U/L — ABNORMAL HIGH (ref 10–35)
Albumin: 4 g/dL (ref 3.6–5.1)
BUN: 9 mg/dL (ref 7–25)
CO2: 25 mmol/L (ref 20–31)
Calcium: 9.6 mg/dL (ref 8.6–10.4)
Chloride: 103 mmol/L (ref 98–110)
Creat: 0.7 mg/dL (ref 0.50–1.05)
Glucose, Bld: 77 mg/dL (ref 70–99)
POTASSIUM: 3.8 mmol/L (ref 3.5–5.3)
Sodium: 139 mmol/L (ref 135–146)
TOTAL PROTEIN: 7.1 g/dL (ref 6.1–8.1)
Total Bilirubin: 0.5 mg/dL (ref 0.2–1.2)

## 2016-10-17 MED ORDER — HYDROCODONE-ACETAMINOPHEN 10-325 MG PO TABS: 1.0000 | ORAL_TABLET | Freq: Four times a day (QID) | ORAL | 0 refills | Status: DC | PRN

## 2016-10-17 NOTE — Progress Notes (Signed)
Subjective:    Patient ID: Kathleen Palmer, female    DOB: [birth year], 56 y.o.   MRN: [id number]  Patient presents for hospital f/u  Pt here for hospital follow up for mental breakdown Diagnosis Panic Disorder/PTSD. Was sent to [facility] 6/11-6/18. She was started on cymbalta  20mg  twice a day and trazodone increased to 100mg  , Rexulti was discontinued. She was continued on xanax  She was seen by her local psychiatrist yesterday and Trazodone was increased to 150mg  with a follow up in 3 weeks  She is on chronic pain medication due to chronic abdominal/pelvic pain, chronic hip pain, pelvic deformity/urostomy status.  UDS was consistent with previous +marijauna and benzo He states that she feels much better and is ready to take care of her health she knows that she needs treatment for hepatitis C but does not want to have treatment wash is still smoking marijuana so was a hold off for another couple months but she is okay with having her labs drawn today. Her other complaint is hip pain which seems to be worsening she feels like one side is a little bit lowered sticking out more and the other which is causing a lot of pain when she walks.   Review Of Systems:  GEN- denies fatigue, fever, weight loss,weakness, recent illness HEENT- denies eye drainage, change in vision, nasal discharge, CVS- denies chest pain, palpitations RESP- denies SOB, cough, wheeze ABD- denies N/V, change in stools, abd pain GU- denies dysuria, hematuria, dribbling, incontinence MSK- +joint pain, muscle aches, injury Neuro- denies headache, dizziness, syncope, seizure activity       Objective:    BP 118/84   Pulse 94   Temp 98.8 F (37.1 C) (Oral)   Resp 16   Wt 162 lb 9.6 oz (73.8 kg)   SpO2 97%   BMI 28.80 kg/m  GEN- NAD, alert and oriented x3 HEENT- PERRL, EOMI, non injected sclera, pink conjunctiva, MMM, oropharynx clear Neck- Supple, no thyromegaly CVS- RRR, no  murmur RESP-CTAB ABD-NABS,soft,NT,ND, urostomy in tact MSK- Spine NT, TTP Right hip, hip sits a little lower rigth compared to left, fair ROM hips/knees, walks with cane  strength decreased RLE compare to left  Psych- smiling, normal affect and mood  EXT- No edema Pulses- Radial, DP- 2+        Assessment & Plan:      Problem List Items Addressed This Visit    Bladder extrophy   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      Relevant Medications   traZODone (DESYREL) 100 MG tablet   traZODone (DESYREL) 50 MG tablet   Marijuana use    counsled on need for cessation, as are even told her she cannot have hepatitis C treatment as she stops smoking marijuana      Chronic pain syndrome - Primary    Continued on her pain medication. I will obtain an x-ray of her pelvis and hip due to increased pain.      Chronic hepatitis C ([place])    Untreated hepatitis C. We'll recheck her liver function studies available to her last hospitalization but there were no liver function studies her cholesterol was normal and her renal function she had mild low platelets about 118,000      Relevant Orders   Comprehensive metabolic panel   Hepatitis C RNA quantitative   CBC with Differential/Platelet    Other Visit Diagnoses  Right hip pain       Relevant Orders   DG HIPS BILAT WITH PELVIS 3-4 VIEWS      Note: This dictation was prepared with Dragon dictation along with smaller phrase technology. Any transcriptional errors that result from this process are unintentional.

## 2016-10-17 NOTE — Patient Instructions (Addendum)
F/U 3 months  Get the xray of hip/pelvis  We will call with lab results

## 2016-10-17 NOTE — Assessment & Plan Note (Signed)
Continue medications per her psychiatrist's orders.

## 2016-10-17 NOTE — Assessment & Plan Note (Signed)
Untreated hepatitis C. We'll recheck her liver function studies available to her last hospitalization but there were no liver function studies her cholesterol was normal and her renal function she had mild low platelets about 118,000

## 2016-10-17 NOTE — Assessment & Plan Note (Signed)
Continued on her pain medication. I will obtain an x-ray of her pelvis and hip due to increased pain.

## 2016-10-17 NOTE — Assessment & Plan Note (Signed)
counsled on need for cessation, as are even told her she cannot have hepatitis C treatment as she stops smoking marijuana

## 2016-10-21 LAB — HEPATITIS C RNA QUANTITATIVE
HCV QUANT: 489000 [IU]/mL — AB
HCV Quantitative Log: 5.69 Log IU/mL — ABNORMAL HIGH

## 2016-10-22 ENCOUNTER — Encounter (HOSPITAL_COMMUNITY): Payer: Self-pay | Admitting: Licensed Clinical Social Worker

## 2016-10-22 ENCOUNTER — Ambulatory Visit (INDEPENDENT_AMBULATORY_CARE_PROVIDER_SITE_OTHER): Payer: Medicare Other | Admitting: Licensed Clinical Social Worker

## 2016-10-22 DIAGNOSIS — F332 Major depressive disorder, recurrent severe without psychotic features: Secondary | ICD-10-CM
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
## 2016-10-23 ENCOUNTER — Other Ambulatory Visit: Payer: Self-pay | Admitting: *Deleted

## 2016-10-23 DIAGNOSIS — B1710 Acute hepatitis C without hepatic coma: Secondary | ICD-10-CM

## 2016-11-03 ENCOUNTER — Encounter: Payer: Self-pay | Admitting: Nurse Practitioner

## 2016-11-05 ENCOUNTER — Ambulatory Visit (HOSPITAL_COMMUNITY): Payer: Self-pay | Admitting: Licensed Clinical Social Worker

## 2016-11-06 ENCOUNTER — Other Ambulatory Visit: Payer: Self-pay | Admitting: Family Medicine

## 2016-11-06 DIAGNOSIS — Z1231 Encounter for screening mammogram for malignant neoplasm of breast: Secondary | ICD-10-CM

## 2016-11-07 ENCOUNTER — Ambulatory Visit (HOSPITAL_COMMUNITY): Payer: Self-pay | Admitting: Psychiatry

## 2016-11-11 ENCOUNTER — Telehealth: Payer: Self-pay | Admitting: Family Medicine

## 2016-11-11 MED ORDER — HYDROCODONE-ACETAMINOPHEN 10-325 MG PO TABS: 1.0000 | ORAL_TABLET | Freq: Four times a day (QID) | ORAL | 0 refills | Status: DC | PRN

## 2016-11-11 NOTE — Telephone Encounter (Signed)
okay

## 2016-11-11 NOTE — Telephone Encounter (Signed)
Ok to refill??  Last office visit/ refill 10/17/2016.

## 2016-11-11 NOTE — Addendum Note (Signed)
Addended by: Sheral Flow on: 11/11/2016 04:00 PM   Modules accepted: Orders

## 2016-11-11 NOTE — Telephone Encounter (Signed)
Pt needs refill on hydrocodone.  °

## 2016-11-11 NOTE — Telephone Encounter (Signed)
Prescription printed and patient made aware to come to office to pick up.  

## 2016-11-12 ENCOUNTER — Ambulatory Visit (INDEPENDENT_AMBULATORY_CARE_PROVIDER_SITE_OTHER): Payer: Medicare Other | Admitting: Psychiatry

## 2016-11-12 ENCOUNTER — Encounter (HOSPITAL_COMMUNITY): Payer: Self-pay | Admitting: Psychiatry

## 2016-11-12 VITALS — BP 132/90 | HR 93 | Ht 63.0 in | Wt 163.8 lb

## 2016-11-12 DIAGNOSIS — F603 Borderline personality disorder: Secondary | ICD-10-CM | POA: Diagnosis not present

## 2016-11-12 DIAGNOSIS — F329 Major depressive disorder, single episode, unspecified: Secondary | ICD-10-CM

## 2016-11-12 DIAGNOSIS — F4310 Post-traumatic stress disorder, unspecified: Secondary | ICD-10-CM | POA: Diagnosis not present

## 2016-11-12 DIAGNOSIS — G8929 Other chronic pain: Secondary | ICD-10-CM | POA: Diagnosis not present

## 2016-11-12 DIAGNOSIS — F1210 Cannabis abuse, uncomplicated: Secondary | ICD-10-CM | POA: Diagnosis not present

## 2016-11-12 DIAGNOSIS — Z79899 Other long term (current) drug therapy: Secondary | ICD-10-CM

## 2016-11-12 DIAGNOSIS — F332 Major depressive disorder, recurrent severe without psychotic features: Secondary | ICD-10-CM

## 2016-11-12 DIAGNOSIS — F39 Unspecified mood [affective] disorder: Secondary | ICD-10-CM | POA: Diagnosis not present

## 2016-11-12 DIAGNOSIS — F419 Anxiety disorder, unspecified: Secondary | ICD-10-CM

## 2016-11-12 DIAGNOSIS — F1021 Alcohol dependence, in remission: Secondary | ICD-10-CM

## 2016-11-12 MED ORDER — DULOXETINE HCL 20 MG PO CPEP: 20.0000 mg | ORAL_CAPSULE | Freq: Two times a day (BID) | ORAL | 2 refills | Status: DC

## 2016-11-12 MED ORDER — TRAZODONE HCL 100 MG PO TABS: 200.0000 mg | ORAL_TABLET | Freq: Every day | ORAL | 2 refills | Status: DC
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
## 2016-11-14 ENCOUNTER — Ambulatory Visit (HOSPITAL_COMMUNITY): Payer: Self-pay

## 2016-11-18 ENCOUNTER — Encounter (HOSPITAL_COMMUNITY): Payer: Self-pay | Admitting: Licensed Clinical Social Worker

## 2016-11-18 ENCOUNTER — Ambulatory Visit (INDEPENDENT_AMBULATORY_CARE_PROVIDER_SITE_OTHER): Payer: Medicare Other | Admitting: Licensed Clinical Social Worker

## 2016-11-18 DIAGNOSIS — Z6372 Alcoholism and drug addiction in family: Secondary | ICD-10-CM | POA: Diagnosis not present

## 2016-11-18 DIAGNOSIS — F332 Major depressive disorder, recurrent severe without psychotic features: Secondary | ICD-10-CM

## 2016-11-18 DIAGNOSIS — Z630 Problems in relationship with spouse or partner: Secondary | ICD-10-CM
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
## 2016-11-21 ENCOUNTER — Telehealth: Payer: Self-pay | Admitting: *Deleted

## 2016-11-21 NOTE — Telephone Encounter (Signed)
I spoke to pt and her husband He states that she has had some increased stress. Has one sister has ALS and she's been taking it difficult. Someone and she wakes up and she is confused yesterday she had a good day. This is her typical behavior. She is not sick in any way. She was looking for her calendar and had fell down then she freaked out because she did not know when her appointment times were so she started calling. He states that he keeps her pain medication to dispense it to her throughout the day he make sure that she does not take more than prescribed. He states that she is at her baseline otherwise. I did speak with her and she said the same thing that she was sorry that she coughed and many times but she became very anxious when she cannot find her calendar.  Advised that if she is having increased mental stress with confusion and he should take her to the emergency room he voiced understanding.

## 2016-11-21 NOTE — Telephone Encounter (Signed)
Received multiple calls from patient on 11/20/2016.  Patient notably confused and speech slurred. Called to office to inquire as to when her OV is scheduled x3. Also called to request refill on pain medication frequently. Prescription printed on 11/11/2016, and Hydrocodone filled on 11/14/2016 per pharmacy. Advised patient of above, but continued to receive calls inquiring about pain medication.   Patient denied anything being wrong with her when asked.   [place] office has received rambling VM from patient on 11/21/2016, inquiring about above. MD to be made aware.

## 2016-11-24 ENCOUNTER — Telehealth (HOSPITAL_COMMUNITY): Payer: Self-pay | Admitting: *Deleted

## 2016-11-24 NOTE — Telephone Encounter (Signed)
phone call for second time this morning from patient asking for appointment dates and times.  when asked if she remembered speaking with me earlier this morning, she said she is having a hard time, she is having a breakdown.   This phone call was transferred to [facility].

## 2016-12-02 ENCOUNTER — Emergency Department (HOSPITAL_COMMUNITY)
Admission: EM | Admit: 2016-12-02 | Discharge: 2016-12-03 | Disposition: A | Discharge status: Other Acute Inpatient Hospital | Payer: Medicare Other | Attending: Emergency Medicine | Admitting: Emergency Medicine

## 2016-12-02 ENCOUNTER — Encounter (HOSPITAL_COMMUNITY): Payer: Self-pay

## 2016-12-02 ENCOUNTER — Emergency Department (HOSPITAL_COMMUNITY): Payer: Medicare Other

## 2016-12-02 DIAGNOSIS — T424X2A Poisoning by benzodiazepines, intentional self-harm, initial encounter: Secondary | ICD-10-CM | POA: Diagnosis not present

## 2016-12-02 DIAGNOSIS — Y998 Other external cause status: Secondary | ICD-10-CM | POA: Insufficient documentation

## 2016-12-02 DIAGNOSIS — R45851 Suicidal ideations: Secondary | ICD-10-CM

## 2016-12-02 DIAGNOSIS — K5900 Constipation, unspecified: Secondary | ICD-10-CM | POA: Diagnosis not present

## 2016-12-02 DIAGNOSIS — B1920 Unspecified viral hepatitis C without hepatic coma: Secondary | ICD-10-CM | POA: Insufficient documentation

## 2016-12-02 DIAGNOSIS — K5730 Diverticulosis of large intestine without perforation or abscess without bleeding: Secondary | ICD-10-CM | POA: Diagnosis not present

## 2016-12-02 DIAGNOSIS — X838XXA Intentional self-harm by other specified means, initial encounter: Secondary | ICD-10-CM | POA: Insufficient documentation

## 2016-12-02 DIAGNOSIS — Y9389 Activity, other specified: Secondary | ICD-10-CM | POA: Diagnosis not present

## 2016-12-02 DIAGNOSIS — F329 Major depressive disorder, single episode, unspecified: Secondary | ICD-10-CM | POA: Diagnosis not present

## 2016-12-02 DIAGNOSIS — Y929 Unspecified place or not applicable: Secondary | ICD-10-CM | POA: Diagnosis not present

## 2016-12-02 DIAGNOSIS — T50902A Poisoning by unspecified drugs, medicaments and biological substances, intentional self-harm, initial encounter: Secondary | ICD-10-CM | POA: Diagnosis not present

## 2016-12-02 LAB — COMPREHENSIVE METABOLIC PANEL
ALBUMIN: 4 g/dL (ref 3.5–5.0)
ALT: 39 U/L (ref 14–54)
ANION GAP: 7 (ref 5–15)
AST: 43 U/L — AB (ref 15–41)
Alkaline Phosphatase: 57 U/L (ref 38–126)
BUN: 9 mg/dL (ref 6–20)
CO2: 28 mmol/L (ref 22–32)
Calcium: 9.4 mg/dL (ref 8.9–10.3)
Chloride: 103 mmol/L (ref 101–111)
Creatinine, Ser: 0.63 mg/dL (ref 0.44–1.00)
GFR calc Af Amer: >60 mL/min (ref >60)
GFR calc non Af Amer: >60 mL/min (ref >60)
GLUCOSE: 122 mg/dL — AB (ref 65–99)
POTASSIUM: 3.6 mmol/L (ref 3.5–5.1)
SODIUM: 138 mmol/L (ref 135–145)
Total Bilirubin: 0.9 mg/dL (ref 0.3–1.2)
Total Protein: 7.4 g/dL (ref 6.5–8.1)

## 2016-12-02 LAB — CBC WITH DIFFERENTIAL/PLATELET
BASOS ABS: 0 10*3/uL (ref 0.0–0.1)
BASOS PCT: 0 %
EOS ABS: 0 10*3/uL (ref 0.0–0.7)
Eosinophils Relative: 1 %
HEMATOCRIT: 40.8 % (ref 36.0–46.0)
HEMOGLOBIN: 13.7 g/dL (ref 12.0–15.0)
Lymphocytes Relative: 15 %
Lymphs Abs: 0.7 10*3/uL (ref 0.7–4.0)
MCH: 29.4 pg (ref 26.0–34.0)
MCHC: 33.6 g/dL (ref 30.0–36.0)
MCV: 87.6 fL (ref 78.0–100.0)
MONO ABS: 0.4 10*3/uL (ref 0.1–1.0)
MONOS PCT: 7 %
NEUTROS ABS: 3.9 10*3/uL (ref 1.7–7.7)
NEUTROS PCT: 77 %
Platelets: 114 10*3/uL — ABNORMAL LOW (ref 150–400)
RBC: 4.66 MIL/uL (ref 3.87–5.11)
RDW: 14.4 % (ref 11.5–15.5)
WBC: 5.1 10*3/uL (ref 4.0–10.5)

## 2016-12-02 LAB — PROTIME-INR
INR: 1.25
PROTHROMBIN TIME: 15.8 s — AB (ref 11.4–15.2)

## 2016-12-02 LAB — RAPID URINE DRUG SCREEN, HOSP PERFORMED
Amphetamines: NOT DETECTED
BENZODIAZEPINES: POSITIVE — AB
Barbiturates: NOT DETECTED
COCAINE: NOT DETECTED
OPIATES: NOT DETECTED
Tetrahydrocannabinol: POSITIVE — AB

## 2016-12-02 LAB — I-STAT BETA HCG BLOOD, ED (MC, WL, AP ONLY): I-stat hCG, quantitative: 10.9 m[IU]/mL — ABNORMAL HIGH (ref <5)

## 2016-12-02 LAB — ETHANOL

## 2016-12-02 LAB — SALICYLATE LEVEL: Salicylate Lvl: <7 mg/dL (ref 2.8–30.0)

## 2016-12-02 LAB — ACETAMINOPHEN LEVEL: Acetaminophen (Tylenol), Serum: <10 ug/mL — ABNORMAL LOW (ref 10–30)

## 2016-12-02 MED ORDER — SODIUM CHLORIDE 0.9 % IV BOLUS (SEPSIS)
1000.0000 mL | Freq: Once | INTRAVENOUS | Status: AC
  Administered 2016-12-02: 1000 mL via INTRAVENOUS

## 2016-12-02 MED ORDER — DULOXETINE HCL 20 MG PO CPEP
20.0000 mg | ORAL_CAPSULE | Freq: Two times a day (BID) | ORAL | Status: DC
  Administered 2016-12-02 – 2016-12-03 (×2): 20 mg via ORAL
  Filled 2016-12-02 (×2): qty 1

## 2016-12-02 MED ORDER — IOPAMIDOL (ISOVUE-300) INJECTION 61%
100.0000 mL | Freq: Once | INTRAVENOUS | Status: AC | PRN
  Administered 2016-12-02: 100 mL via INTRAVENOUS

## 2016-12-02 MED ORDER — HYDROXYZINE HCL 50 MG/ML IM SOLN
100.0000 mg | Freq: Once | INTRAMUSCULAR | Status: AC
  Administered 2016-12-02: 100 mg via INTRAMUSCULAR
  Filled 2016-12-02: qty 2

## 2016-12-02 MED ORDER — TRAZODONE HCL 50 MG PO TABS
200.0000 mg | ORAL_TABLET | Freq: Every day | ORAL | Status: DC
  Administered 2016-12-02: 200 mg via ORAL
  Filled 2016-12-02: qty 4

## 2016-12-02 MED ORDER — LORAZEPAM 1 MG PO TABS
1.0000 mg | ORAL_TABLET | Freq: Once | ORAL | Status: AC
  Administered 2016-12-02: 1 mg via ORAL
  Filled 2016-12-02: qty 1

## 2016-12-02 MED ORDER — LORAZEPAM 1 MG PO TABS
1.0000 mg | ORAL_TABLET | Freq: Four times a day (QID) | ORAL | Status: DC | PRN
  Administered 2016-12-02 – 2016-12-03 (×5): 1 mg via ORAL
  Filled 2016-12-02 (×5): qty 1
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
## 2016-12-02 NOTE — ED Notes (Signed)
Pt tearful stating she needs something for anxiety, MD Zammit notified.

## 2016-12-02 NOTE — ED Provider Notes (Signed)
[facility] DEPT Provider Note   CSN: [id number] Arrival date & time: 12/02/16  2595     History   Chief Complaint Chief Complaint  Patient presents with  . V70.1    HPI Kathleen Palmer is a 56 y.o. female.  Patient states that 4 days ago she took some benzos some Vicodin and some Percocet approximately 5-10 of each pill in attempt to kill herself. She laid in bed for 4 days and then decided she didn't want to die. She did eat much but she did drink a lot of fluids   The history is provided by the patient.  Ingestion  This is a new problem. The current episode started more than 2 days ago. The problem occurs rarely. The problem has been resolved. Pertinent negatives include no chest pain, no abdominal pain and no headaches. Nothing aggravates the symptoms. Nothing relieves the symptoms. She has tried nothing for the symptoms. The treatment provided moderate relief.    Past Medical History:  Diagnosis Date  . Angiomyolipoma    Left kidney  . Anxiety   . Arthritis   . Attention to urostomy [facility])   . Chronic pain   . Depression   . Hepatitis C    ? Contracted through IVDA  . Panic 04/29/1999  . PTSD (post-traumatic stress disorder)   . Substance abuse    Remote history- cocaine, ETOH, Marijuana  . Thyroid disease     Patient Active Problem List   Diagnosis Date Noted  . Family hx of ALS (amyotrophic lateral sclerosis) 08/31/2015  . Chronic hepatitis C ([place]) 01/24/2014  . Osteopenia 12/02/2013  . OA (osteoarthritis) of knee 09/20/2013  . Clitoral irritation 08/01/2013  . Epidermoid cyst of skin 08/01/2013  . Atypical chest pain 03/25/2013  . Colon cancer screening 03/25/2013  . Knee pain 03/25/2013  . Marijuana use 07/20/2012  . Unspecified vitamin D deficiency 06/02/2012  . Pain 04/07/2012  . Elevated blood pressure (not hypertension) 04/06/2012  . 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 Subclinical hypothyroidism 02/06/2012  . Chronic pain syndrome  02/06/2012  . H/O vaginal surgery 02/06/2012  . 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 Ileostomy status ([place]) 02/06/2012  . 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 PTSD (post-traumatic stress disorder) 02/06/2012  . MDD (major depressive disorder) ([place]) 02/06/2012    Past Surgical History:  Procedure Laterality Date  . ABDOMINAL HYSTERECTOMY    . ABDOMINAL SURGERY    . CHOLECYSTECTOMY    . HERNIA REPAIR    . ILEO LOOP CONDUIT    . 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 VAGINA RECONSTRUCTION SURGERY      OB History    No data available       Home Medications    Prior to Admission medications   Medication Sig Start Date End Date Taking? Authorizing Provider  ALPRAZolam Duanne Moron) 1 MG tablet Take 1 tablet (1 mg total) by mouth 4 (four) times daily. 10/02/16 10/02/17 Yes Cloria Spring, MD  aspirin-acetaminophen-caffeine (EXCEDRIN MIGRAINE) [phone] MG tablet Take 2 tablets by mouth every 6 (six) hours as needed for headache.   Yes [provider]  Cyanocobalamin (VITAMIN B-12 PO) Take 1 tablet by mouth daily.   Yes [provider]  DULoxetine (CYMBALTA) 20 MG capsule Take 1 capsule (20 mg total) by mouth 2 (two) times daily. 11/12/16  Yes Cloria Spring, MD  ESTRACE VAGINAL 0.1 MG/GM vaginal cream Place vaginally 3 (three) times a week.  04/01/12  Yes [provider]  HYDROcodone-acetaminophen (NORCO) 10-325 MG tablet Take 1 tablet by mouth every 6 (six) hours as needed for moderate pain. 11/11/16  Yes [place], Modena Nunnery, MD  Multiple Vitamin (MULTIVITAMIN WITH MINERALS) TABS tablet Take 1 tablet by mouth daily.   Yes [provider]  Naphazoline HCl (CLEAR EYES OP) Apply 2 drops to eye daily as needed (dry eyes).   Yes [provider]  traZODone (DESYREL) 100 MG tablet Take 2 tablets (200 mg total) by mouth at bedtime. 11/12/16  Yes  Cloria Spring, MD  vitamin C (ASCORBIC ACID) 500 MG tablet Take 500 mg by mouth daily.   Yes [provider]  UNABLE TO FIND Ostomy Night Drainage Collector  Ref # [id number].  Use as directed. 12/12/14   Alycia Rossetti, MD    Family History Family History  Problem Relation Age of Onset  . Adopted: Yes  . Depression Mother   . Depression Sister   . Anxiety disorder Sister   . Bipolar disorder Sister   . Depression Maternal Grandmother   . ADD / ADHD Neg Hx   . Alcohol abuse Neg Hx   . Drug abuse Neg Hx   . Dementia Neg Hx   . OCD Neg Hx   . Paranoid behavior Neg Hx   . Schizophrenia Neg Hx   . Seizures Neg Hx   . Sexual abuse Neg Hx   . Physical abuse Neg Hx   . Suicidality Neg Hx   . Colon cancer Neg Hx     Social History Social History  Substance Use Topics  . Smoking status: Former Smoker    Packs/day: 0.25    Years: 33.00    Types: Cigarettes    Quit date: 01/21/2015  . Smokeless tobacco: Never Used     Comment: 9-10 cigs a day as of 10/20/2012, (02-07-15 per pt, she stopped smoking 01-21-15)  . Alcohol use No     Comment: occasionally     Allergies   Oxycodone; Abilify [aripiprazole]; Ampicillin; Bactrim [sulfamethoxazole-trimethoprim]; and Gabapentin   Review of Systems Review of Systems  Constitutional: Negative for appetite change and fatigue.  HENT: Negative for congestion, ear discharge and sinus pressure.   Eyes: Negative for discharge.  Respiratory: Negative for cough.   Cardiovascular: Negative for chest pain.  Gastrointestinal: Negative for abdominal pain and diarrhea.  Genitourinary: Negative for frequency and hematuria.  Musculoskeletal: Negative for back pain.  Skin: Negative for rash.  Neurological: Negative for seizures and headaches.  Psychiatric/Behavioral: Positive for dysphoric mood. Negative for hallucinations.     Physical Exam Updated Vital Signs BP 136/80 (BP Location: Right Arm)   Pulse 90   Temp 98.4 F (36.9 C)  (Oral)   Resp 14   Ht 5\' 3"  (1.6 m)   Wt 72.6 kg (160 lb)   SpO2 95%   BMI 28.34 kg/m   Physical Exam  Constitutional: She is oriented to person, place, and time. She appears well-developed.  HENT:  Head: Normocephalic.  Eyes: Conjunctivae and EOM are normal. No scleral icterus.  Neck: Neck supple. No thyromegaly present.  Cardiovascular: Normal rate and regular rhythm.  Exam reveals no gallop and no friction rub.   No murmur heard. Pulmonary/Chest: No stridor. She has no wheezes. She has no rales. She exhibits no tenderness.  Abdominal: She exhibits no distension. There is no tenderness. There is no rebound.  Musculoskeletal: Normal range of motion. She exhibits no edema.  Lymphadenopathy:    She has no cervical adenopathy.  Neurological: She is oriented to person, place, and time. She exhibits normal muscle tone. Coordination normal.  Skin: No rash noted. No erythema.  Psychiatric:  Patient depressed but states she's not suicidal now     ED Treatments / Results  Labs (all labs ordered are listed, but only abnormal results are displayed) Labs Reviewed  CBC WITH DIFFERENTIAL/PLATELET - Abnormal; Notable for the following:       Result Value   Platelets 114 (*)    All other components within normal limits  COMPREHENSIVE METABOLIC PANEL - Abnormal; Notable for the following:    Glucose, Bld 122 (*)    AST 43 (*)    All other components within normal limits  ACETAMINOPHEN LEVEL - Abnormal; Notable for the following:    Acetaminophen (Tylenol), Serum <10 (*)    All other components within normal limits  PROTIME-INR - Abnormal; Notable for the following:    Prothrombin Time 15.8 (*)    All other components within normal limits  RAPID URINE DRUG SCREEN, HOSP PERFORMED - Abnormal; Notable for the following:    Benzodiazepines POSITIVE (*)    Tetrahydrocannabinol POSITIVE (*)    All other components within normal limits  SALICYLATE LEVEL  ETHANOL    EKG  EKG  Interpretation None       Radiology Ct Abdomen Pelvis W Contrast  Addendum Date: 12/02/2016   ADDENDUM REPORT: 12/02/2016 11:33 ADDENDUM: Omitted finding of mildly enlarged lymph nodes in the deep liver drainage, up to 1 cm, usually reactive and incidental in isolation. Cirrhosis workup has been recommended. Electronically Signed   By: Monte Fantasia M.D.   On: 12/02/2016 11:33   Result Date: 12/02/2016 CLINICAL DATA:  Constipation.  Last bowel movement 5 days ago. EXAM: CT ABDOMEN AND PELVIS WITH CONTRAST TECHNIQUE: Multidetector CT imaging of the abdomen and pelvis was performed using the standard protocol following bolus administration of intravenous contrast. CONTRAST:  169mL ISOVUE-300 IOPAMIDOL (ISOVUE-300) INJECTION 61% COMPARISON:  03/07/2015 FINDINGS: Lower chest:  No contributory findings. Hepatobiliary: No focal liver abnormality.Cholecystectomy with chronic intrahepatic bile duct prominence. Mild surface lobulation of the liver. Pancreas: Unremarkable. Spleen: No splenomegaly with 17 cm length and nearly 7 cm thickness. No focal abnormality is seen. No venous compromise. Adrenals/Urinary Tract: Negative adrenals. There is bilateral renal cortical scarring, worse on the right. Unremarkable appearance of the urostomy. Mild hydroureter and pelviectasis. No suspected obstruction. History of bladder exstrophy. Soft tissue density at the level of the bladder with channel extending to the skin surface at the mons pubis, chronic. Patient has history of fistula. Stomach/Bowel: Mild colonic diverticulosis. No abnormal stool retention or rectal impaction. No evidence of bowel inflammation. The appendix is not well seen, potentially resected at ileal conduit formation. No pericecal inflammation. Vascular/Lymphatic: No acute vascular abnormality. Mild atherosclerotic calcification of the aorta. Stable tortuosity and intermittent narrowing of the left common femoral vein. No mass or adenopathy.  Reproductive:Hysterectomy. Other: No ascites or pneumoperitoneum. Lax abdominal wall with mesh repair of a left-sided hernia. Mild herniation of fat at the superior margin of the mesh that has progressed. Musculoskeletal: Dysmorphic pelvis with symphysis pubis diastasis. 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 There is advanced degeneration of the right sacroiliac joint. Asymmetric right hip osteoarthritis. IMPRESSION: 1. No acute finding. No abnormal stool retention to correlate with constipation history. 2. New splenomegaly, nonspecific. Patient has history of hepatitis-C and there is  mild liver surface lobulation, suggest cirrhosis workup. 3. History of bladder exstrophy with stable pelvic wound. Urostomy and chronic renal scarring greater on the right. 4. Left abdominal wall hernia with mesh repair. Interval recurrent fatty herniation along the superior mesh. 5. Colonic diverticulosis Electronically Signed: By: Monte Fantasia M.D. On: 12/02/2016 10:49    Procedures Procedures (including critical care time)  Medications Ordered in ED Medications  sodium chloride 0.9 % bolus 1,000 mL (0 mLs Intravenous Stopped 12/02/16 1056)  iopamidol (ISOVUE-300) 61 % injection 100 mL (100 mLs Intravenous Contrast Given 12/02/16 1028)  LORazepam (ATIVAN) tablet 1 mg (1 mg Oral Given 12/02/16 1110)  hydrOXYzine (VISTARIL) injection 100 mg (100 mg Intramuscular Given 12/02/16 1441)     Initial Impression / Assessment and Plan / ED Course  I have reviewed the triage vital signs and the nursing notes.  Pertinent labs & imaging results that were available during my care of the patient were reviewed by me and considered in my medical decision making (see chart for details).     Patient with ingestion and suicidal attempt. Patient is medically cleared and is awaiting behavior health evaluation.  Final Clinical Impressions(s) / ED Diagnoses   Final diagnoses:  None    New Prescriptions New Prescriptions   No  medications on file     Milton [place], MD 12/02/16 1511

## 2016-12-02 NOTE — ED Notes (Signed)
During implementing task and treatment, educated patient on how to help manage her illness.  Discussed just like patients with heart issues, we treat mental issues the same.  Informed heart patients have to take medication and follow up with doctor, so does patients with mental issues.  Pt is very grateful for the talk and has agreed to speak with Spiritual Care.

## 2016-12-02 NOTE — Progress Notes (Signed)
Present with Kathleen Palmer for spiritual support. She shared her family history of suicide. She remained tearful throughout much of our 20 minute visit. We discussed her faith and how she feels this is helpful for her.  She shared she doesn't feel she has a strong support system, family in [place], no faith community connection here. We discussed how she might receive  help from others through this hospitalization. She shared she had wanted to talk with someone about her faith  as she stated she really wanted to live though her current situation had overwhelmed her. We prayed for her to feel connected as she sought healing.

## 2016-12-02 NOTE — ED Notes (Signed)
Pt cleared by poison control at this time per [facility]. Proceed with psych eval.

## 2016-12-02 NOTE — ED Notes (Signed)
Pt up to restroom at this with sitter at side.

## 2016-12-02 NOTE — ED Notes (Signed)
Contacted Poison control Alyse Low) at [phone], regarding patient possibly taking overdose of 8 Xanax, 10 Hydrocodone, and 5 Percocet.  Informed of treatments provided and agrees with current orders.  Christy to call back for follow up of labs.

## 2016-12-02 NOTE — ED Notes (Signed)
Pt denies SI/HI, AVH at this time. States she doesn't want to go to the hospital, pt reminded that she is currently in the hospital to seek help like she wanted. Informed pt that TTS consult would take place to try to help her with her psychiatric conditions. Pt tearful and states that she needs help because "I tried suicide because I couldn't deal with my problems."

## 2016-12-02 NOTE — Progress Notes (Signed)
CSW reviewed pt chart. Per Hughie Closs, NP, pt meets criteria for inpatient hospitalization.  Pt referral packet sent to the following hospitals: [facility],  [facility],  [place],  [place],  [place],  [place],  [place].  Disposition:  CSW will continue to follow for placement.  Areatha Keas. Judi Cong, MSW, [place] Disposition Clinical Social Work [phone] (cell) [phone] (office)

## 2016-12-02 NOTE — ED Notes (Signed)
Last BM 5 days ago.

## 2016-12-02 NOTE — ED Triage Notes (Signed)
Pt arrived at ER registration and sat in floor because she felt very weak.  Assisted pt to wheelchair and  pt reported she took a handful of xanax 1mg , percocet, and hydrocodone 10/325mg  4 days ago in an attempt to kill herself.  Pt doesn't know the dose of the percocet.     Reports has been depressed for "a long time." Today pt reports feels like heart is racing and is constipated.  LBM was 5 days ago.  Reports has been in bed for the past 4 days and says she is having withdrawals from xanax.  Pt tearful, states, "I don't want to die."

## 2016-12-02 NOTE — BH Assessment (Signed)
Zerita Boers, NP recommends inpatient psychiatric treatment. TTS to look for placement

## 2016-12-02 NOTE — ED Notes (Signed)
Kathleen Palmer is on the way.  Pt refuses to contact family at this time.

## 2016-12-02 NOTE — ED Notes (Signed)
Pt used second phone call at this time.

## 2016-12-03 ENCOUNTER — Inpatient Hospital Stay (HOSPITAL_COMMUNITY)
Admission: RE | Admit: 2016-12-03 | Discharge: 2016-12-09 | DRG: 885 | Disposition: A | Discharge status: Home / Self Care | Payer: Medicare Other | Source: Other Acute Inpatient Hospital | Attending: Psychiatry | Admitting: Psychiatry

## 2016-12-03 ENCOUNTER — Encounter (HOSPITAL_COMMUNITY): Payer: Self-pay

## 2016-12-03 DIAGNOSIS — Z87891 Personal history of nicotine dependence: Secondary | ICD-10-CM

## 2016-12-03 DIAGNOSIS — Z880 Allergy status to penicillin: Secondary | ICD-10-CM

## 2016-12-03 DIAGNOSIS — T50902A Poisoning by unspecified drugs, medicaments and biological substances, intentional self-harm, initial encounter: Secondary | ICD-10-CM | POA: Diagnosis not present

## 2016-12-03 DIAGNOSIS — F5105 Insomnia due to other mental disorder: Secondary | ICD-10-CM | POA: Diagnosis present

## 2016-12-03 DIAGNOSIS — Z90710 Acquired absence of both cervix and uterus: Secondary | ICD-10-CM | POA: Diagnosis not present

## 2016-12-03 DIAGNOSIS — B1920 Unspecified viral hepatitis C without hepatic coma: Secondary | ICD-10-CM | POA: Diagnosis not present

## 2016-12-03 DIAGNOSIS — Z888 Allergy status to other drugs, medicaments and biological substances status: Secondary | ICD-10-CM | POA: Diagnosis not present

## 2016-12-03 DIAGNOSIS — Z881 Allergy status to other antibiotic agents status: Secondary | ICD-10-CM

## 2016-12-03 DIAGNOSIS — R45851 Suicidal ideations: Secondary | ICD-10-CM | POA: Diagnosis not present

## 2016-12-03 DIAGNOSIS — Z62811 Personal history of psychological abuse in childhood: Secondary | ICD-10-CM | POA: Diagnosis not present

## 2016-12-03 DIAGNOSIS — F1994 Other psychoactive substance use, unspecified with psychoactive substance-induced mood disorder: Secondary | ICD-10-CM | POA: Diagnosis present

## 2016-12-03 DIAGNOSIS — Z62810 Personal history of physical and sexual abuse in childhood: Secondary | ICD-10-CM | POA: Diagnosis not present

## 2016-12-03 DIAGNOSIS — Z932 Ileostomy status: Secondary | ICD-10-CM | POA: Diagnosis not present

## 2016-12-03 DIAGNOSIS — Z9049 Acquired absence of other specified parts of digestive tract: Secondary | ICD-10-CM

## 2016-12-03 DIAGNOSIS — F332 Major depressive disorder, recurrent severe without psychotic features: Secondary | ICD-10-CM | POA: Diagnosis not present

## 2016-12-03 DIAGNOSIS — Z79899 Other long term (current) drug therapy: Secondary | ICD-10-CM

## 2016-12-03 DIAGNOSIS — Z736 Limitation of activities due to disability: Secondary | ICD-10-CM | POA: Diagnosis not present

## 2016-12-03 DIAGNOSIS — Z8619 Personal history of other infectious and parasitic diseases: Secondary | ICD-10-CM | POA: Diagnosis not present

## 2016-12-03 DIAGNOSIS — Z820 Family history of epilepsy and other diseases of the nervous system: Secondary | ICD-10-CM | POA: Diagnosis not present

## 2016-12-03 DIAGNOSIS — M1711 Unilateral primary osteoarthritis, right knee: Secondary | ICD-10-CM | POA: Diagnosis present

## 2016-12-03 DIAGNOSIS — G4700 Insomnia, unspecified: Secondary | ICD-10-CM | POA: Diagnosis not present

## 2016-12-03 DIAGNOSIS — Z818 Family history of other mental and behavioral disorders: Secondary | ICD-10-CM | POA: Diagnosis not present

## 2016-12-03 DIAGNOSIS — Z87798 Personal history of other (corrected) congenital malformations: Secondary | ICD-10-CM | POA: Diagnosis not present

## 2016-12-03 DIAGNOSIS — G894 Chronic pain syndrome: Secondary | ICD-10-CM | POA: Diagnosis present

## 2016-12-03 DIAGNOSIS — F39 Unspecified mood [affective] disorder: Secondary | ICD-10-CM | POA: Diagnosis not present

## 2016-12-03 DIAGNOSIS — F410 Panic disorder [episodic paroxysmal anxiety] without agoraphobia: Secondary | ICD-10-CM | POA: Diagnosis present

## 2016-12-03 DIAGNOSIS — F4310 Post-traumatic stress disorder, unspecified: Secondary | ICD-10-CM | POA: Diagnosis present

## 2016-12-03 DIAGNOSIS — K5900 Constipation, unspecified: Secondary | ICD-10-CM | POA: Diagnosis not present

## 2016-12-03 DIAGNOSIS — Z885 Allergy status to narcotic agent status: Secondary | ICD-10-CM | POA: Diagnosis not present

## 2016-12-03 DIAGNOSIS — F1290 Cannabis use, unspecified, uncomplicated: Secondary | ICD-10-CM | POA: Diagnosis present

## 2016-12-03 DIAGNOSIS — F329 Major depressive disorder, single episode, unspecified: Secondary | ICD-10-CM | POA: Diagnosis not present

## 2016-12-03 DIAGNOSIS — F419 Anxiety disorder, unspecified: Secondary | ICD-10-CM | POA: Diagnosis not present

## 2016-12-03 NOTE — ED Notes (Signed)
Pt states she needs something for agitation. Pt was given ativan.

## 2016-12-03 NOTE — ED Notes (Signed)
Pt husband brought suitcase with clothing for when pt gets transferred.  Suitcase placed in locker area.  Also brought urostomy supplies. PT informed.

## 2016-12-03 NOTE — ED Notes (Signed)
Meal given

## 2016-12-03 NOTE — Progress Notes (Signed)
Pt accepted to [facility] Bed 304-2.  Hughie Closs, NP is the accepting provier.  Neita Garnet, MD is the attending provider.  Call report to [phone].  Patient may be transported to arrive at any time after 7 PM.  AP ED Nurse, Jonelle Sidle, notified.  Areatha Keas. Judi Cong, MSW, [place] Disposition Clinical Social Work [phone] (cell) [phone] (office)

## 2016-12-03 NOTE — ED Notes (Signed)
Pt ambulated to restroom with sitter. 

## 2016-12-03 NOTE — Progress Notes (Signed)
Admission Note:  56 year old female who presents voluntary in no acute distress for the treatment of SI following an intentional overdose of "pain pills and Xanax".  Patient appears flat, depressed, and tearful. Patient was cooperative with admission process. Patient presents with passive SI and contracts for safety upon admission. Patient denies AVH.  Patient identifies main stressor as "my sister's dying".  Patient reports that her sister is battling ALS.  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  Additionally, patient has complaints of being "tired" and "weak".  Patient currently lives at home with her husband and identifies her husband as her support system. Patient reports family hx of suicide stating her mother committed suicide and a long family hx of mental illness.  While at [facility], patient would like to work on "getting my meds straight" and "talk to the psychologist".  Skin was assessed and found to be clear of any abnormal marks apart from an ostomy bag.  Patient searched and no contraband found, POC and unit policies explained and understanding verbalized. Consents obtained.  Patient had no additional questions or concerns.

## 2016-12-03 NOTE — BH Assessment (Signed)
Reassessment- Pt denies current SI. Pt reports SI attempt 5 days ago. 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 Pt states when she withdraws from Xanax she has SI.   TTS will continue to seek placement.

## 2016-12-03 NOTE — ED Notes (Signed)
TTS in progress 

## 2016-12-03 NOTE — ED Notes (Signed)
Meal given to pt.

## 2016-12-03 NOTE — ED Notes (Addendum)
Pt taken to shower by security and sitter, given new socks, scrubs, shampoo, bodywash, toothbrush/paste and linens changed. Also given new urostomy bag.

## 2016-12-03 NOTE — Progress Notes (Signed)
REVIEWED-NO ADDITIONAL RECOMMENDATIONS. 

## 2016-12-03 NOTE — Progress Notes (Signed)
Patient ID: Kathleen Palmer, female   DOB: 1961-04-28, 56 y.o.   MRN: [id number] Per State regulations 482.30 this chart was reviewed for medical necessity with respect to the patient's admission/duration of stay.    Next review date: 12/05/16  Debarah Crape, BSN, RN-BC  Case Manager

## 2016-12-03 NOTE — Tx Team (Signed)
Initial Treatment Plan 12/03/2016 11:51 PM Kathleen Palmer DHD:[id number]    PATIENT STRESSORS: Loss of support; recent move Marital or family conflict Substance abuse   PATIENT STRENGTHS: Ability for insight Average or above average intelligence Capable of independent living Communication skills General fund of knowledge   PATIENT IDENTIFIED PROBLEMS: Depression  Suicide Risk  Substance abuse  "getting my medications straight"  "talk to the psychologist"             DISCHARGE CRITERIA:  Improved stabilization in mood, thinking, and/or behavior Motivation to continue treatment in a less acute level of care Safe-care adequate arrangements made Verbal commitment to aftercare and medication compliance  PRELIMINARY DISCHARGE PLAN: Outpatient therapy Participate in family therapy Return to previous living arrangement  PATIENT/FAMILY INVOLVEMENT: This treatment plan has been presented to and reviewed with the patient, Kathleen Palmer .  The patient and family have been given the opportunity to ask questions and make suggestions.  Elenore Rota, RN 12/03/2016, 11:51 PM

## 2016-12-04 ENCOUNTER — Encounter (HOSPITAL_COMMUNITY): Payer: Self-pay | Admitting: Behavioral Health

## 2016-12-04 DIAGNOSIS — F1994 Other psychoactive substance use, unspecified with psychoactive substance-induced mood disorder: Secondary | ICD-10-CM | POA: Diagnosis present

## 2016-12-04 DIAGNOSIS — F332 Major depressive disorder, recurrent severe without psychotic features: Secondary | ICD-10-CM

## 2016-12-04 DIAGNOSIS — Z736 Limitation of activities due to disability: Secondary | ICD-10-CM

## 2016-12-04 DIAGNOSIS — Z62810 Personal history of physical and sexual abuse in childhood: Secondary | ICD-10-CM

## 2016-12-04 DIAGNOSIS — Z62811 Personal history of psychological abuse in childhood: Secondary | ICD-10-CM

## 2016-12-04 MED ORDER — TRAZODONE HCL 100 MG PO TABS
100.0000 mg | ORAL_TABLET | Freq: Every evening | ORAL | Status: DC | PRN
  Administered 2016-12-04 – 2016-12-08 (×5): 100 mg via ORAL
  Filled 2016-12-04 (×5): qty 1

## 2016-12-04 MED ORDER — ALPRAZOLAM 0.5 MG PO TABS
0.5000 mg | ORAL_TABLET | Freq: Three times a day (TID) | ORAL | Status: DC
  Administered 2016-12-04 (×2): 0.5 mg via ORAL
  Filled 2016-12-04 (×2): qty 1

## 2016-12-04 MED ORDER — HYDROXYZINE HCL 25 MG PO TABS
25.0000 mg | ORAL_TABLET | Freq: Four times a day (QID) | ORAL | Status: DC | PRN
  Administered 2016-12-04 – 2016-12-05 (×5): 25 mg via ORAL
  Filled 2016-12-04 (×5): qty 1

## 2016-12-04 MED ORDER — ACETAMINOPHEN 325 MG PO TABS: 650.0000 mg | ORAL_TABLET | Freq: Four times a day (QID) | ORAL | Status: DC | PRN

## 2016-12-04 MED ORDER — DULOXETINE HCL 20 MG PO CPEP
20.0000 mg | ORAL_CAPSULE | Freq: Two times a day (BID) | ORAL | Status: DC
  Administered 2016-12-04 (×2): 20 mg via ORAL
  Filled 2016-12-04 (×5): qty 1

## 2016-12-04 MED ORDER — ALUM & MAG HYDROXIDE-SIMETH 200-200-20 MG/5ML PO SUSP: 30.0000 mL | ORAL | Status: DC | PRN

## 2016-12-04 MED ORDER — TRAZODONE HCL 100 MG PO TABS: 100.0000 mg | ORAL_TABLET | Freq: Every day | ORAL | Status: DC

## 2016-12-04 MED ORDER — ALPRAZOLAM 0.5 MG PO TABS
0.5000 mg | ORAL_TABLET | Freq: Four times a day (QID) | ORAL | Status: DC
  Administered 2016-12-04 – 2016-12-09 (×19): 0.5 mg via ORAL
  Filled 2016-12-04 (×19): qty 1

## 2016-12-04 MED ORDER — TRAZODONE HCL 100 MG PO TABS
200.0000 mg | ORAL_TABLET | Freq: Every day | ORAL | Status: DC
  Filled 2016-12-04 (×3): qty 2

## 2016-12-04 MED ORDER — MAGNESIUM HYDROXIDE 400 MG/5ML PO SUSP
30.0000 mL | Freq: Every day | ORAL | Status: DC | PRN
  Administered 2016-12-06 – 2016-12-07 (×2): 30 mL via ORAL
  Filled 2016-12-04 (×2): qty 30

## 2016-12-04 NOTE — BHH Suicide Risk Assessment (Signed)
[facility] Admission Suicide Risk Assessment   Nursing information obtained from:   patient and chart  Demographic factors:   56 year old female, married, no children, on disability  Current Mental Status:   as below  Loss Factors:   medical issues, related to congential illness  Historical Factors:   depression, states she has never attempted suicide in the past, denies history of mania. Reports history of Panic Attacks. Risk Reduction Factors:   husband is supportive.  Total Time spent with patient: 45 minutes Principal Problem: Depression  Diagnosis:   Patient Active Problem List   Diagnosis Date Noted  . Substance induced mood disorder ([place]) [F19.94] 12/04/2016  . Family hx of ALS (amyotrophic lateral sclerosis) [Z82.0] 08/31/2015  . Chronic hepatitis C ([place]) [B18.2] 01/24/2014  . Osteopenia [M85.80] 12/02/2013  . OA (osteoarthritis) of knee [M17.10] 09/20/2013  . Clitoral irritation [N90.89] 08/01/2013  . Epidermoid cyst of skin [L72.0] 08/01/2013  . Atypical chest pain [R07.89] 03/25/2013  . Colon cancer screening [Z12.11] 03/25/2013  . Knee pain [M25.569] 03/25/2013  . Marijuana use [F12.90] 07/20/2012  . Unspecified vitamin D deficiency [E55.9] 06/02/2012  . Pain [R52] 04/07/2012  . Elevated blood pressure (not hypertension) [R03.0] 04/06/2012  . 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 Subclinical hypothyroidism [E03.9] 02/06/2012  . Chronic pain syndrome [G89.4] 02/06/2012  . H/O vaginal surgery [Z98.890] 02/06/2012  . 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 Ileostomy status ([place]) [Z93.2] 02/06/2012  . 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 PTSD (post-traumatic stress disorder) [F43.10] 02/06/2012  . MDD (major depressive disorder) ([place]) [F32.9] 02/06/2012    Continued Clinical Symptoms:  Alcohol Use Disorder Identification Test Final Score (AUDIT): 0 The "Alcohol Use Disorders Identification Test", Guidelines for Use in Primary Care, Second Edition.   World Pharmacologist [facility]). Score between 0-7:  no or low risk or alcohol related problems. Score between 8-15:  moderate risk of alcohol related problems. Score between 16-19:  high risk of alcohol related problems. Score 20 or above:  warrants further diagnostic evaluation for alcohol dependence and treatment.   CLINICAL FACTORS:  Patient is a 56 year old female. She reports history of depression and states she has been feeling more depressed over recent months . Endorses neuro-vegetative symptoms of depression, such as poor sleep, poor appetite, poor energy level, and anhedonia. Denies psychotic symptoms.She had been experiencing some passive SI. Attributes her depression at least partially to her twin sister currently being severely ill with ALS .  She states she recently  impulsively overdosed on " a handful " of xanax, percocet. States she thinks she took about five of each. Of note, states that this was 3-4 days prior to admission. States " I just felt worse and worse", and states " finally my husband brought me to the hospital". At this time reports she is not sure this overdose was suicidal in intent, states " I think it was a cry for help". Admission UDS positive for BZDs and for Cannabis, negative for opiates .  Denies alcohol abuse.   She reports long history of being on opiate analgesics( Hydrocodone)  and Alprazolam " for 20 years ".  Patient reports a history of chronic depression and anxiety, denies prior suicidal attempts, denies history of mania , denies history of psychosis, endorses chronic anxiety. She follows with Dr. Levonne Spiller for psychiatric medication management . Patient on Cymbalta 20 mgrs BID, Trazodone 200 mgr QHS , Xanax 1 mgr QID - this  verified via [place] controlled  substance reporting system- last prescription for this medication 11/07/16.   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  States she has had multiple surgeries during her lifetime.   Dx- MDD, recurrent, severe, no psychotic features   Plan- inpatient admission- we discussed treatment options . Patient states she feels motivated in decreasing Xanax doses and in stopping opiate analgesia .  (As noted, admission UDS negative for opiates and currently not presenting with opiate WDL symptoms. Will not currently start opiate WDL protocol, but will continue to monitor for potential symptoms of withdrawal. ) Change Xanax to 0.5 mgr Q 6 hours - prefers QID dosing . Continue Cymbalta 20 mgrs BID  Decrease Trazodone to 100 mgrs QHS PRN for insomnia     Musculoskeletal: Strength & Muscle Tone: within normal limits Gait & Station: slow gait Patient leans: N/A  Psychiatric Specialty Exam: Physical Exam  ROS no chest pain, no shortness of breath, no vomiting , reports difficulty ambulating due to congenital malformation history  Blood pressure 139/89, pulse (!) 117, temperature 98.2 F (36.8 C), temperature source Oral, resp. rate 18, height 5\' 3"  (1.6 m), weight 73 kg (161 lb).Body mass index is 28.52 kg/m.  General Appearance: Fairly Groomed  Eye Contact:  Good  Speech:  Normal Rate  Volume:  Decreased  Mood:  depressed   Affect:  anxious, constricted, but reactive   Thought Process:  Linear and Descriptions of Associations: Intact  Orientation:  Full (Time, Place, and Person)  Thought Content:  denies hallucinations, no delusions, not internally preoccupied   Suicidal Thoughts:  No denies current suicidal or self injurious ideations, denies homicidal or violent ideations, no psychotic symptoms  Homicidal Thoughts:  No  Memory:  recent and remote grossly intact   Judgement:  Other:  fair   Insight:  Fair  Psychomotor Activity:  Normal- no tremors, no diaphoresis, no acute distress or restlessness  Concentration:  Concentration: Good and Attention Span: Good  Recall:  Good  Fund of Knowledge:  Good   Language:  Good  Akathisia:  Negative  Handed:  Right  AIMS (if indicated):     Assets:  Desire for Improvement Resilience Social Support  ADL's:  Fair   Cognition:  WNL  Sleep:  Number of Hours: 5.5      COGNITIVE FEATURES THAT CONTRIBUTE TO RISK:  Closed-mindedness and Loss of executive function    SUICIDE RISK:   Moderate:  Frequent suicidal ideation with limited intensity, and duration, some specificity in terms of plans, no associated intent, good self-control, limited dysphoria/symptomatology, some risk factors present, and identifiable protective factors, including available and accessible social support.  PLAN OF CARE: Patient will be admitted to inpatient psychiatric unit for stabilization and safety. Will provide and encourage milieu participation. Provide medication management and maked adjustments as needed.  Will follow daily.    I certify that inpatient services furnished can reasonably be expected to improve the patient's condition.   Jenne Campus, MD 12/04/2016, 2:51 PM

## 2016-12-04 NOTE — Progress Notes (Signed)
D:Pt is sad and tearful talking about her twin sister that has ALS. Pt is scheduled to see MD and NP following lunch and plans to discuss her medications. She c/o anxiety and was given prn Vistaril. Pt rates depression and anxiety as a 10 on 0-10 scale with 10 being the most. Pt reports that she cannot take tylenol as she has Hep C and plans to discuss pain medication options with MD. She has rt hip discomfort from birth abnormalities.  A:Offered support, encouragement and 15 minute checks. Alerted MD for evaluation. R:Pt denies si and hi. Safety maintained on the unit.

## 2016-12-04 NOTE — BHH Group Notes (Signed)
[place] LCSW Group Therapy  12/04/2016 2:58 PM  Type of Therapy:  Group Therapy  Participation Level:  Did Not Attend-pt invited. Chose to remain in bed.   Summary of Progress/Problems:  Finding Balance in Life. Today's group focused on defining balance in one's own words, identifying things that can knock one off balance, and exploring healthy ways to maintain balance in life. Group members were asked to provide an example of a time when they felt off balance, describe how they handled that situation,and process healthier ways to regain balance in the future. Group members were asked to share the most important tool for maintaining balance that they learned while at [facility] and how they plan to apply this method after discharge.   [place] LCSW 12/04/2016, 2:58 PM

## 2016-12-04 NOTE — Progress Notes (Signed)
Report received from [place], [place]. Fluids and snacks offered, Pt. Refused. Pt proceeded to go to sleep without any other c/o. Will monitor.

## 2016-12-04 NOTE — Progress Notes (Signed)
Pt is requesting a walker and reports that she uses a walker at home as needed. MD made aware. Pt was assisted with a bath in hall restroom. Safety maintained.

## 2016-12-04 NOTE — Progress Notes (Signed)
  DATA ACTION RESPONSE  Objective- Pt. is visible in the room, seen resting in bed with eyes open. Presents with an anxious/fearful affect and mood. Pt becomes tearful with conversation. C/o of withdrawal s/s.  Subjective- Denies having any SI/HI/AVH/Pain at this time. Pt. states "I've been on opiates and Xanax for 20 years". Is cooperative and remain safe on the unit.  1:1 interaction in private to establish rapport. Encouragement, education, & support given from staff.  PRN Trazodone requested and will re-eval accordingly.   Safety maintained with Q 15 checks. Continue with POC.

## 2016-12-04 NOTE — Plan of Care (Signed)
Problem: Activity: Goal: Imbalance in normal sleep/wake cycle will improve Outcome: Not Progressing Pt is anxious and depressed. She reports that she did not sleep well last night.

## 2016-12-04 NOTE — Plan of Care (Signed)
Problem: Activity: Goal: Sleeping patterns will improve Outcome: Progressing Pt slept 5.5 hrs last night.

## 2016-12-04 NOTE — H&P (Signed)
Psychiatric Admission Assessment Adult  Patient Identification: Kathleen Palmer MRN:  [id number] Date of Evaluation:  12/04/2016 Chief Complaint:  mdd recurrent severe without psychosis Principal Diagnosis: MDD (major depressive disorder), recurrent severe, without psychosis (HCC) Diagnosis:   Patient Active Problem List   Diagnosis Date Noted  . Substance induced mood disorder (HCC) [F19.94] 12/04/2016  . MDD (major depressive disorder), recurrent severe, without psychosis (HCC) [F33.2] 12/04/2016  . Family hx of ALS (amyotrophic lateral sclerosis) [Z82.0] 08/31/2015  . Chronic hepatitis C (HCC) [B18.2] 01/24/2014  . Osteopenia [M85.80] 12/02/2013  . OA (osteoarthritis) of knee [M17.10] 09/20/2013  . Clitoral irritation [N90.89] 08/01/2013  . Epidermoid cyst of skin [L72.0] 08/01/2013  . Atypical chest pain [R07.89] 03/25/2013  . Colon cancer screening [Z12.11] 03/25/2013  . Knee pain [M25.569] 03/25/2013  . Marijuana use [F12.90] 07/20/2012  . Unspecified vitamin D deficiency [E55.9] 06/02/2012  . Pain [R52] 04/07/2012  . Elevated blood pressure (not hypertension) [R03.0] 04/06/2012  . 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 Subclinical hypothyroidism [E03.9] 02/06/2012  . Chronic pain syndrome [G89.4] 02/06/2012  . H/O vaginal surgery [Z98.890] 02/06/2012  . 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 Ileostomy status (HCC) [Z93.2] 02/06/2012  . 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 PTSD (post-traumatic stress disorder) [F43.10] 02/06/2012  . MDD (major depressive disorder) (HCC) [F32.9] 02/06/2012   History of Present Illness: She reports history of depression and states she has been feeling more depressed over recent months . Endorses neuro-vegetative symptoms of depression, such as poor sleep, poor appetite, poor energy level, and anhedonia. Denies psychotic symptoms.She had been experiencing some passive SI. Attributes her depression at least partially to her  twin sister currently being severely ill with ALS .  She states she recently  impulsively overdosed on " a handful " of xanax, percocet. States she thinks she took about five of each. Of note, states that this was 3-4 days prior to admission. States " I just felt worse and worse", and states " finally my husband brought me to the hospital". At this time reports she is not sure this overdose was suicidal in intent, states " I think it was a cry for help".  Admission UDS positive for BZDs and for Cannabis, negative for opiates .Denies alcohol abuse. She reports long history of being on opiate analgesics( Hydrocodone)  and Alprazolam " for 20 years ".  Patient reports a history of chronic depression and anxiety, denies prior suicidal attempts, denies history of mania , denies history of psychosis, endorses chronic anxiety. She follows with Dr. Diannia Ruder for psychiatric medication management . Patient on Cymbalta 20 mgrs BID, Trazodone 200 mgr QHS , Xanax 1 mgr QID - this verified via [place] controlled substance reporting system- last prescription for this medication 11/07/16.   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 States she has had multiple surgeries during her lifetime.    Per admission note:56 year old female who presents voluntary in no acute distress for the treatment of SI following an intentional overdose of "pain pills and Xanax".  Patient appears flat, depressed, and tearful. Patient was cooperative with admission process. Patient presents with passive SI and contracts for safety upon admission. Patient denies AVH.  Patient identifies main stressor as "my sister's dying".  Patient reports that her sister is battling ALS.  Patient reports that she does not have a pelvic bone and complains of pain stating "my bones are  rubbing together".  Additionally, patient has complaints of being "tired" and "weak".  Patient currently  lives at home with her husband and identifies her husband as her support system. Patient reports family hx of suicide stating her mother committed suicide and a long family hx of mental illness.  While at [facility], patient would like to work on "getting my meds straight" and "talk to the psychologist".  Skin was assessed and found to be clear of any abnormal marks apart from an ostomy bag.  Patient searched and no contraband found, POC and unit policies explained and understanding verbalized. Consents obtained.  Patient had no additional questions or concerns.  Associated Signs/Symptoms: Depression Symptoms:  depressed mood, anhedonia, insomnia, recurrent thoughts of death, anxiety, loss of energy/fatigue, decreased appetite, (Hypo) Manic Symptoms:  none  Anxiety Symptoms:  Excessive Worry, Panic Symptoms, Psychotic Symptoms:  none  PTSD Symptoms: NA Total Time spent with patient: 1 hour  Past Psychiatric History: MDD, Anxiety. Multiple ED visits for depression and panic disorder. Currently sees [name] for outpatient psychiatric care. Endorses being on Xanax and Hydrocodone for the past 20 years.   Is the patient at risk to self? Yes.    Has the patient been a risk to self in the past 6 months? Yes.    Has the patient been a risk to self within the distant past? Yes.    Is the patient a risk to others? No.  Has the patient been a risk to others in the past 6 months? No.  Has the patient been a risk to others within the distant past? No.    Alcohol Screening: 1. How often do you have a drink containing alcohol?: Never 9. Have you or someone else been injured as a result of your drinking?: No 10. Has a relative or friend or a doctor or another health worker been concerned about your drinking or suggested you cut down?: No Alcohol Use Disorder Identification Test Final Score (AUDIT): 0 Brief Intervention: AUDIT score less than 7 or less-screening does not suggest unhealthy drinking-brief  intervention not indicated Substance Abuse History in the last 12 months:  No. Consequences of Substance Abuse: NA Previous Psychotropic Medications: Yes  Psychological Evaluations: No  Past Medical History:  Past Medical History:  Diagnosis Date  . Angiomyolipoma    Left kidney  . Anxiety   . Arthritis   . Attention to urostomy [facility])   . Chronic pain   . Depression   . Hepatitis C    ? Contracted through IVDA  . Panic 04/29/1999  . PTSD (post-traumatic stress disorder)   . Substance abuse    Remote history- cocaine, ETOH, Marijuana  . Thyroid disease     Past Surgical History:  Procedure Laterality Date  . ABDOMINAL HYSTERECTOMY    . ABDOMINAL SURGERY    . CHOLECYSTECTOMY    . HERNIA REPAIR    . ILEO LOOP CONDUIT    . 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 VAGINA RECONSTRUCTION SURGERY     Family History:  Family History  Problem Relation Age of Onset  . Adopted: Yes  . Depression Mother   . Depression Sister   . Anxiety disorder Sister   . Bipolar disorder Sister   . Depression Maternal Grandmother   . ADD / ADHD Neg Hx   . Alcohol abuse Neg Hx   . Drug  abuse Neg Hx   . Dementia Neg Hx   . OCD Neg Hx   . Paranoid behavior Neg Hx   . Schizophrenia Neg Hx   . Seizures Neg Hx   . Sexual abuse Neg Hx   . Physical abuse Neg Hx   . Suicidality Neg Hx   . Colon cancer Neg Hx    Family Psychiatric  History: Depression. Grandmother dies in a mental institution. Mother dies from suicide.    Tobacco Screening: Have you used any form of tobacco in the last 30 days? (Cigarettes, Smokeless Tobacco, Cigars, and/or Pipes): No Social History:  History  Alcohol Use No    Comment: occasionally     History  Drug Use  . Types: Marijuana    Comment: cocaine, back in the 1980s    Additional Social History: Marital status: Married Number of Years  Married: 23 What types of issues is patient dealing with in the relationship?: Husband's alcohol use frustrates her. "I long ago stopped trying to change."  Additional relationship information: "He is supportive of being here."  Are you sexually active?: No What is your sexual orientation?: Heterosexual  Has your sexual activity been affected by drugs, alcohol, medication, or emotional stress?: None  Does patient have children?: No    Pain Medications: see MAR Prescriptions: see MAR Over the Counter: see MAR History of alcohol / drug use?: Yes Longest period of sobriety (when/how long): Unknown Negative Consequences of Use: Personal relationships, Work / School Name of Substance 1: cannabis 1 - Age of First Use: 56 yr old  1 - Amount (size/oz): bowl- 2 hits 1 - Frequency: daily in the evening  1 - Duration: years 1 - Last Use / Amount: not in 5 days                  Allergies:   Allergies  Allergen Reactions  . Oxycodone Anxiety  . Abilify [Aripiprazole]     Stiff neck  . Ampicillin     rash  . Bactrim [Sulfamethoxazole-Trimethoprim]     Tongue swelled  . Gabapentin     Dizziness to the point she actually fell   Lab Results:  Results for orders placed or performed during the hospital encounter of 12/02/16 (from the past 48 hour(s))  I-Stat beta hCG blood, ED     Status: Abnormal   Collection Time: 12/02/16  4:44 PM  Result Value Ref Range   I-stat hCG, quantitative 10.9 (H) <5 mIU/mL   Comment 3            Comment:   GEST. AGE      CONC.  (mIU/mL)   <=1 WEEK        5 - 50     2 WEEKS       50 - 500     3 WEEKS       100 - 10,000     4 WEEKS     1,000 - 30,000        FEMALE AND NON-PREGNANT FEMALE:     LESS THAN 5 mIU/mL     Blood Alcohol level:  Lab Results  Component Value Date   ETH <5 12/02/2016   ETH <5 91/63/8466    Metabolic Disorder Labs:  Lab Results  Component Value Date   HGBA1C 5.7 (H) 10/23/2014   MPG 117 (H) 10/23/2014   MPG 120 (H)  01/13/2012   No results found for: PROLACTIN Lab Results  Component Value Date  CHOL 190 10/23/2014   TRIG 147 10/23/2014   HDL 54 10/23/2014   CHOLHDL 3.5 10/23/2014   VLDL 29 10/23/2014   LDLCALC 107 (H) 10/23/2014   LDLCALC 112 (H) 12/28/2012    Current Medications: Current Facility-Administered Medications  Medication Dose Route Frequency Provider Last Rate Last Dose  . acetaminophen (TYLENOL) tablet 650 mg  650 mg Oral Q6H PRN Laverle Hobby, PA-C      . ALPRAZolam Duanne Moron) tablet 0.5 mg  0.5 mg Oral QID Necola Bluestein, Myer Peer, MD      . alum & mag hydroxide-simeth (MAALOX/MYLANTA) 200-200-20 MG/5ML suspension 30 mL  30 mL Oral Q4H PRN Laverle Hobby, PA-C      . DULoxetine (CYMBALTA) DR capsule 20 mg  20 mg Oral BID Laverle Hobby, PA-C   20 mg at 12/04/16 0932  . hydrOXYzine (ATARAX/VISTARIL) tablet 25 mg  25 mg Oral Q6H PRN Laverle Hobby, PA-C   25 mg at 12/04/16 1132  . magnesium hydroxide (MILK OF MAGNESIA) suspension 30 mL  30 mL Oral Daily PRN Laverle Hobby, PA-C      . traZODone (DESYREL) tablet 100 mg  100 mg Oral QHS PRN Tehillah Cipriani, Myer Peer, MD       PTA Medications: Prescriptions Prior to Admission  Medication Sig Dispense Refill Last Dose  . DULoxetine (CYMBALTA) 20 MG capsule Take 1 capsule (20 mg total) by mouth 2 (two) times daily. 60 capsule 2 12/01/2016 at Unknown time  . traZODone (DESYREL) 100 MG tablet Take 2 tablets (200 mg total) by mouth at bedtime. 60 tablet 2 12/01/2016 at Unknown time    Musculoskeletal: Strength & Muscle Tone: within normal limits Gait & Station: unsteady, slow Patient leans: N/A  Psychiatric Specialty Exam: Physical Exam  Nursing note and vitals reviewed. Constitutional: She is oriented to person, place, and time.  Neurological: She is alert and oriented to person, place, and time.    ROS  Blood pressure 139/89, pulse (!) 117, temperature 98.2 F (36.8 C), temperature source Oral, resp. rate 18, height '5\' 3"'$  (1.6 m),  weight 161 lb (73 kg).Body mass index is 28.52 kg/m.  General Appearance: Fairly Groomed  Eye Contact:  Good  Speech:  Clear and Coherent and Normal Rate  Volume:  Decreased  Mood:  Depressed  Affect:  anxious  Thought Process:  Coherent, Linear and Descriptions of Associations: Intact  Orientation:  Full (Time, Place, and Person)  Thought Content:  Logical  denies hallucinations, no delusions, not internally preoccupied   Suicidal Thoughts:  No denies current suicidal or self injurious ideations, denies homicidal or violent ideations, no psychotic symptoms  Homicidal Thoughts:  No  Memory:  Immediate;   Fair Recent;   Fair  Judgement:  Fair  Insight:  Fair  Psychomotor Activity:  Normal  Concentration:  Concentration: Fair and Attention Span: Fair  Recall:  [employer] of Knowledge:  Fair  Language:  Good  Akathisia:  Negative  Handed:  Right  AIMS (if indicated):     Assets:  Communication Skills Desire for Improvement Resilience  ADL's:  Intact  Cognition:  WNL  Sleep:  Number of Hours: 5.5    Treatment Plan Summary: Daily contact with patient to assess and evaluate symptoms and progress in treatment   Treatment Plan/Recommendations: 1. Admit for crisis management and stabilization, estimated length of stay 3-5 days.  2. Medication management to reduce current symptoms to base line and improve the patient's overall level of functioning: Per Md's  SRATreatment plan. Patient states she feels motivated in decreasing Xanax doses and in stopping opiate analgesia .  (As noted, admission UDS negative for opiates and currently not presenting with opiate WDL symptoms. Will not currently start opiate WDL protocol, but will continue to monitor for potential symptoms of withdrawal. ) Change Xanax to 0.5 mgr Q 6 hours - prefers QID dosing . Continue Cymbalta 20 mgrs BID  Decrease Trazodone to 100 mgrs QHS PRN for insomnia ? 3. Treat health problems as indicated.  4. Develop treatment  plan to decrease risk of relapse upon discharge and the need for readmission.  5. Psycho-social education regarding relapse prevention and self care.  6. Health care follow up as needed for medical problems.  7. Review, reconcile, and reinstate any pertinent home medications for other health issues where appropriate. 8. Call for consults with hospitalist for any additional specialty patient care services as needed.    Observation Level/Precautions:  15 minute checks  Laboratory:  Per, UDS (+) for benzodiazepines & THC. Ordered TSH, lipid panel and HgbA1c  Psychotherapy:  Group milieu   Medications:  See MAR  Consultations:  As needed.  Discharge Concerns:  Mood stability, maintaining sobriety & safety  Estimated LOS:2-4 days.  Other:  Admit to the 300-hall.     Physician Treatment Plan for Primary Diagnosis: MDD (major depressive disorder), recurrent severe, without psychosis ([place]) Long Term Goal(s): Improvement in symptoms so as ready for discharge  Short Term Goals: Ability to identify changes in lifestyle to reduce recurrence of condition will improve, Ability to verbalize feelings will improve, Compliance with prescribed medications will improve and Ability to identify triggers associated with substance abuse/mental health issues will improve  Physician Treatment Plan for Secondary Diagnosis: Principal Problem:   MDD (major depressive disorder), recurrent severe, without psychosis ([place]) Active Problems:   Substance induced mood disorder ([place])  Long Term Goal(s): Improvement in symptoms so as ready for discharge  Short Term Goals: Ability to disclose and discuss suicidal ideas and Ability to identify and develop effective coping behaviors will improve  I certify that inpatient services furnished can reasonably be expected to improve the patient's condition.    Mordecai Maes, NP 8/9/20183:30 PM   I have reviewed case with NP and have met with patient Agree with NP Assessment   Patient is a 56 year old female. She reports history of depression and states she has been feeling more depressed over recent months . Endorses neuro-vegetative symptoms of depression, such as poor sleep, poor appetite, poor energy level, and anhedonia. Denies psychotic symptoms.She had been experiencing some passive SI. Attributes her depression at least partially to her twin sister currently being severely ill with ALS .  She states she recently  impulsively overdosed on " a handful " of xanax, percocet. States she thinks she took about five of each. Of note, states that this was 3-4 days prior to admission. States " I just felt worse and worse", and states " finally my husband brought me to the hospital". At this time reports she is not sure this overdose was suicidal in intent, states " I think it was a cry for help". Admission UDS positive for BZDs and for Cannabis, negative for opiates .  Denies alcohol abuse.   She reports long history of being on opiate analgesics( Hydrocodone)  and Alprazolam " for 20 years ".  Patient reports a history of chronic depression and anxiety, denies prior suicidal attempts, denies history of mania , denies history of psychosis,  endorses chronic anxiety. She follows with Dr. Levonne Spiller for psychiatric medication management . Patient on Cymbalta 20 mgrs BID, Trazodone 200 mgr QHS , Xanax 1 mgr QID - this  verified via [place] controlled substance reporting system- last prescription for this medication 11/07/16.   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 States she has had multiple surgeries during her lifetime.   Dx- MDD, recurrent, severe, no psychotic features   Plan- inpatient admission- we discussed treatment options . Patient states she feels motivated in decreasing Xanax doses and in stopping opiate analgesia .  (As noted, admission UDS negative for opiates and currently not  presenting with opiate WDL symptoms. Will not currently start opiate WDL protocol, but will continue to monitor for potential symptoms of withdrawal. ) Change Xanax to 0.5 mgr Q 6 hours - prefers QID dosing . Continue Cymbalta 20 mgrs BID  Decrease Trazodone to 100 mgrs QHS PRN for insomnia
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
## 2016-12-04 NOTE — Tx Team (Signed)
Interdisciplinary Treatment and Diagnostic Plan Update  12/05/2016 Time of Session: [id number] Kathleen Palmer MRN: [id number]  Principal Diagnosis: MDD (major depressive disorder), recurrent severe, without psychosis (HCC)  Secondary Diagnoses: Principal Problem:   MDD (major depressive disorder), recurrent severe, without psychosis (HCC) Active Problems:   Substance induced mood disorder (HCC)   Current Medications:  Current Facility-Administered Medications  Medication Dose Route Frequency Provider Last Rate Last Dose  . acetaminophen (TYLENOL) tablet 650 mg  650 mg Oral Q6H PRN Kerry Hough, PA-C      . ALPRAZolam Prudy Feeler) tablet 0.5 mg  0.5 mg Oral QID Cobos, Rockey Situ, MD   0.5 mg at 12/05/16 0813  . alum & mag hydroxide-simeth (MAALOX/MYLANTA) 200-200-20 MG/5ML suspension 30 mL  30 mL Oral Q4H PRN Kerry Hough, PA-C      . DULoxetine (CYMBALTA) DR capsule 20 mg  20 mg Oral BID Kerry Hough, PA-C   20 mg at 12/04/16 1718  . hydrOXYzine (ATARAX/VISTARIL) tablet 25 mg  25 mg Oral Q6H PRN Kerry Hough, PA-C   25 mg at 12/05/16 0153  . magnesium hydroxide (MILK OF MAGNESIA) suspension 30 mL  30 mL Oral Daily PRN Kerry Hough, PA-C      . traZODone (DESYREL) tablet 100 mg  100 mg Oral QHS PRN Cobos, Rockey Situ, MD   100 mg at 12/04/16 2105   PTA Medications: Prescriptions Prior to Admission  Medication Sig Dispense Refill Last Dose  . DULoxetine (CYMBALTA) 20 MG capsule Take 1 capsule (20 mg total) by mouth 2 (two) times daily. 60 capsule 2 12/01/2016 at Unknown time  . traZODone (DESYREL) 100 MG tablet Take 2 tablets (200 mg total) by mouth at bedtime. 60 tablet 2 12/01/2016 at Unknown time    Patient Stressors: Loss of support; recent move Marital or family conflict Substance abuse  Patient Strengths: Ability for insight Average or above average intelligence Capable of independent living Communication skills General fund of knowledge  Treatment Modalities:  Medication Management, Group therapy, Case management,  1 to 1 session with clinician, Psychoeducation, Recreational therapy.   Physician Treatment Plan for Primary Diagnosis: MDD (major depressive disorder), recurrent severe, without psychosis (HCC) Long Term Goal(s): Improvement in symptoms so as ready for discharge Improvement in symptoms so as ready for discharge   Short Term Goals: Ability to identify changes in lifestyle to reduce recurrence of condition will improve Ability to verbalize feelings will improve Compliance with prescribed medications will improve Ability to identify triggers associated with substance abuse/mental health issues will improve Ability to disclose and discuss suicidal ideas Ability to identify and develop effective coping behaviors will improve  Medication Management: Evaluate patient's response, side effects, and tolerance of medication regimen.  Therapeutic Interventions: 1 to 1 sessions, Unit Group sessions and Medication administration.  Evaluation of Outcomes: Not Met  Physician Treatment Plan for Secondary Diagnosis: Principal Problem:   MDD (major depressive disorder), recurrent severe, without psychosis (HCC) Active Problems:   Substance induced mood disorder (HCC)  Long Term Goal(s): Improvement in symptoms so as ready for discharge Improvement in symptoms so as ready for discharge   Short Term Goals: Ability to identify changes in lifestyle to reduce recurrence of condition will improve Ability to verbalize feelings will improve Compliance with prescribed medications will improve Ability to identify triggers associated with substance abuse/mental health issues will improve Ability to disclose and discuss suicidal ideas Ability to identify and develop effective coping behaviors will improve     Medication  Management: Evaluate patient's response, side effects, and tolerance of medication regimen.  Therapeutic Interventions: 1 to 1  sessions, Unit Group sessions and Medication administration.  Evaluation of Outcomes: Not Met   RN Treatment Plan for Primary Diagnosis: MDD (major depressive disorder), recurrent severe, without psychosis ([place]) Long Term Goal(s): Knowledge of disease and therapeutic regimen to maintain health will improve  Short Term Goals: Ability to remain free from injury will improve, Ability to participate in decision making will improve and Ability to verbalize feelings will improve  Medication Management: RN will administer medications as ordered by provider, will assess and evaluate patient's response and provide education to patient for prescribed medication. RN will report any adverse and/or side effects to prescribing provider.  Therapeutic Interventions: 1 on 1 counseling sessions, Psychoeducation, Medication administration, Evaluate responses to treatment, Monitor vital signs and CBGs as ordered, Perform/monitor CIWA, COWS, AIMS and Fall Risk screenings as ordered, Perform wound care treatments as ordered.  Evaluation of Outcomes: Not Met   LCSW Treatment Plan for Primary Diagnosis: MDD (major depressive disorder), recurrent severe, without psychosis ([place]) Long Term Goal(s): Safe transition to appropriate next level of care at discharge, Engage patient in therapeutic group addressing interpersonal concerns.  Short Term Goals: Engage patient in aftercare planning with referrals and resources, Facilitate acceptance of mental health diagnosis and concerns and Facilitate patient progression through stages of change regarding substance use diagnoses and concerns  Therapeutic Interventions: Assess for all discharge needs, 1 to 1 time with Social worker, Explore available resources and support systems, Assess for adequacy in community support network, Educate family and significant other(s) on suicide prevention, Complete Psychosocial Assessment, Interpersonal group therapy.  Evaluation of Outcomes:  Not Met   Progress in Treatment: Attending groups: No. Participating in groups: No. New to unit. Continuing to assess.  Taking medication as prescribed: Yes. Toleration medication: Yes. Family/Significant other contact made: No, will contact:  family member/husband if patient consents Patient understands diagnosis: Yes. Discussing patient identified problems/goals with staff: Yes. Medical problems stabilized or resolved: Yes. Denies suicidal/homicidal ideation: No. Pt endorsing passive SI/able to contract for safety on the unit.  Issues/concerns per patient self-inventory: No. Other: n/a   New problem(s) identified: No, Describe:  n/a   Patient Goal: "To get my head straight and to calm down and focus on myself."   New Short Term/Long Term Goal(s): elimination of SI thoughts; benzo detox, medication management for mood stabilization; development of comprehensive mental wellness/sobriety plan.   Discharge Plan or Barriers: Pt lives with her husband in [place] and sees [name] for medication management and "Josh" for therapy--both at [facility] in [place].   Reason for Continuation of Hospitalization: Anxiety Depression Medication stabilization Suicidal ideation  Withdrawals/xanax  Estimated Length of Stay: Monday, 12/08/16  Attendees: Patient: 12/05/2016 9:00 AM  Physician: Dr. Parke Poisson MD 12/05/2016 9:00 AM  Nursing: Trinna Post; Kieth Brightly RN 12/05/2016 9:00 AM  RN Care Manager: Lars Pinks CM 12/05/2016 9:00 AM  Social Worker: Maxie Better, LCSW 12/05/2016 9:00 AM  Recreational Therapist: x 12/05/2016 9:00 AM  Other: Lindell Spar NP; Darnelle Maffucci Money NP; Mordecai Maes NP 12/05/2016 9:00 AM  Other:  12/05/2016 9:00 AM  Other: 12/05/2016 9:00 AM    Scribe for Treatment Team: Kimber Relic Smart, LCSW 12/05/2016 9:00 AM

## 2016-12-04 NOTE — BHH Suicide Risk Assessment (Signed)
[place] INPATIENT:  Family/Significant Other Suicide Prevention Education  Suicide Prevention Education:  Contact Attempts: Kathleen Palmer (pt's husband) [phone] has been identified by the patient as the family member/significant other with whom the patient will be residing, and identified as the person(s) who will aid the patient in the event of a mental health crisis.  With written consent from the patient, two attempts were made to provide suicide prevention education, prior to and/or following the patient's discharge.  We were unsuccessful in providing suicide prevention education.  A suicide education pamphlet was given to the patient to share with family/significant other.  Date and time of first attempt: 12/04/16 at 10:15AM (voicemail left requesting call back at his earliest convenience.  Date and time of second attempt: 12/05/16 at 3:35PM  [employer] LCSW 12/04/2016, 10:18 AM

## 2016-12-05 DIAGNOSIS — F1290 Cannabis use, unspecified, uncomplicated: Secondary | ICD-10-CM

## 2016-12-05 DIAGNOSIS — F419 Anxiety disorder, unspecified: Secondary | ICD-10-CM

## 2016-12-05 DIAGNOSIS — F39 Unspecified mood [affective] disorder: Secondary | ICD-10-CM

## 2016-12-05 DIAGNOSIS — Z87891 Personal history of nicotine dependence: Secondary | ICD-10-CM

## 2016-12-05 DIAGNOSIS — G4700 Insomnia, unspecified: Secondary | ICD-10-CM

## 2016-12-05 DIAGNOSIS — F1994 Other psychoactive substance use, unspecified with psychoactive substance-induced mood disorder: Secondary | ICD-10-CM

## 2016-12-05 DIAGNOSIS — Z818 Family history of other mental and behavioral disorders: Secondary | ICD-10-CM

## 2016-12-05 LAB — LIPID PANEL
CHOL/HDL RATIO: 2.9 ratio
CHOLESTEROL: 129 mg/dL (ref 0–200)
HDL: 45 mg/dL (ref >40)
LDL Cholesterol: 70 mg/dL (ref 0–99)
Triglycerides: 69 mg/dL (ref <150)
VLDL: 14 mg/dL (ref 0–40)

## 2016-12-05 LAB — TSH: TSH: 9.591 u[IU]/mL — ABNORMAL HIGH (ref 0.350–4.500)

## 2016-12-05 LAB — HEMOGLOBIN A1C
Hgb A1c MFr Bld: 5.2 % (ref 4.8–5.6)
MEAN PLASMA GLUCOSE: 102.54 mg/dL

## 2016-12-05 MED ORDER — HYDROXYZINE HCL 25 MG PO TABS
ORAL_TABLET | ORAL | Status: AC
  Administered 2016-12-06: 25 mg
  Filled 2016-12-05: qty 1

## 2016-12-05 MED ORDER — LAMOTRIGINE 25 MG PO TABS
25.0000 mg | ORAL_TABLET | Freq: Two times a day (BID) | ORAL | Status: DC
  Administered 2016-12-05 – 2016-12-09 (×8): 25 mg via ORAL
  Filled 2016-12-05 (×10): qty 1

## 2016-12-05 MED ORDER — HYDROXYZINE HCL 50 MG PO TABS
50.0000 mg | ORAL_TABLET | Freq: Four times a day (QID) | ORAL | Status: DC | PRN
  Administered 2016-12-05: 50 mg via ORAL
  Administered 2016-12-05: 25 mg via ORAL
  Administered 2016-12-06 – 2016-12-07 (×3): 50 mg via ORAL
  Filled 2016-12-05 (×3): qty 1

## 2016-12-05 MED ORDER — LORAZEPAM 0.5 MG PO TABS
0.5000 mg | ORAL_TABLET | Freq: Once | ORAL | Status: AC
  Administered 2016-12-05: 0.5 mg via ORAL
  Filled 2016-12-05: qty 1

## 2016-12-05 NOTE — Progress Notes (Signed)
Recreation Therapy Notes  Date: 12/05/2016 Time: 9:30am Location: [address] Dayroom  Group Topic: Stress Management  Goal Area(s) Addresses:  Patient will verbalize importance of using healthy stress management.  Patient will identify positive emotions associated with healthy stress management.   Behavioral Response: Engaged  Intervention: Stress Management  Activity :  Guided Meditation. Recreation Therapy Intern introduced the stress management technique of guided meditation. Recreation Therapy Intern read a script that allowed patients to reach into their inner "chakra." Recreation Therapy Intern played calming meditation music. Patients were to follow along as script was read to engage in the activity.  Education: Stress Management, Discharge Planning.   Education Outcome: Acknowledges edcuation  Clinical Observations/Feedback: Pt attended group.  Kaneshia Cater, Recreation Therapy Intern 

## 2016-12-05 NOTE — Progress Notes (Signed)
D   Pt reports anxiety and nervousness but said the increased dose of vistaril made it worse  So she just wanted the 25 mg dose instead of the 50 mg dose   She brightens on approach and is cooperative   She did mention she was constipated  However when she was offered MOM she declined and said she would talk to the doc tomorrow to see if she can get something else   A    Verbal support given   Medications administered and effectiveness monitored    Q 15 min checks    R    Pt is safe at present time

## 2016-12-05 NOTE — BHH Group Notes (Signed)
[place] LCSW Group Therapy  12/05/2016 1:13 PM  Type of Therapy:  Group Therapy  Participation Level:  Active  Participation Quality:  Attentive  Affect:  Appropriate  Cognitive:  Alert and Oriented  Insight:  Improving  Engagement in Therapy:  Improving  Modes of Intervention:  Confrontation, Discussion, Education, Socialization and Support  Summary of Progress/Problems: Feelings around Relapse. Group members discussed the meaning of relapse and shared personal stories of relapse, how it affected them and others, and how they perceived themselves during this time. Group members were encouraged to identify triggers, warning signs and coping skills used when facing the possibility of relapse. Social supports were discussed and explored in detail. Post Acute Withdrawal Syndrome (handout provided) was introduced and examined. Pt's were encouraged to ask questions, talk about key points associated with PAWS, and process this information in terms of relapse prevention.   Kathleen Palmer Summer N Smart LCSW 12/05/2016, 1:13 PM

## 2016-12-05 NOTE — Progress Notes (Signed)
Patient ID: Kathleen Palmer, female   DOB: November 01, 1960, 56 y.o.   MRN: [id number]  DAR: Pt. Denies SI/HI and A/V Hallucinations. She reports sleep is poor-fair, appetite is fair, energy level is low, and concentration is poor. She rates depression 9/10, hopelessness 8/10, and anxiety 10/10. Patient does report pain in her pelvic area that is chronic in nature. Patient changed her ostomy bag this morning. Support and encouragement provided to the patient throughout the day. Patient is somewhat steady on her feet therefore she is utilizing her wheelchair. [name] pulse is elevated this morning, she is anxious frequently throughout the day and is receiving PRN Vistaril. Scheduled medications administered to patient's orders. Patient is labile in mood today. One minute she is crying and anxious, then she is irritable and another minute she is smiling and pleasant. Patient asserts, "I only have anxiety." She reports that she does not want to take Cymbalta anymore, treatment team was notified. Patient was notified that she would be started on a mood stabilizer and she reports, "I don't think I need it." Writer encouraged patient to give the medication an opportunity. Patient verbalized understanding. She is seen in the milieu intermittently throughout the day. Q15 minute checks are maintained for safety.

## 2016-12-05 NOTE — Progress Notes (Signed)
[facility] MD Progress Note  12/05/2016 1:11 PM Kathleen Palmer  MRN:  [id number] Subjective:  Patient states that since she started the antidepressant she has had increased anxiety, racing thoughts, and difficulty focusing and concentrating. She states that she will try a mood stabilizer, but she has been on Abilify, Zoloft, Effexor, with all causing the same results.  Objective: Patient is labile and becomes tearful. She is highly concerned and focused on her anxiety and the anxiety medications. She asked for an extra dose of Ativan on top of the scheduled Xanax, which was refused.   Principal Problem: MDD (major depressive disorder), recurrent severe, without psychosis ([place]) Diagnosis:   Patient Active Problem List   Diagnosis Date Noted  . Substance induced mood disorder ([place]) [F19.94] 12/04/2016  . MDD (major depressive disorder), recurrent severe, without psychosis ([place]) [F33.2] 12/04/2016  . Family hx of ALS (amyotrophic lateral sclerosis) [Z82.0] 08/31/2015  . Chronic hepatitis C ([place]) [B18.2] 01/24/2014  . Osteopenia [M85.80] 12/02/2013  . OA (osteoarthritis) of knee [M17.10] 09/20/2013  . Clitoral irritation [N90.89] 08/01/2013  . Epidermoid cyst of skin [L72.0] 08/01/2013  . Atypical chest pain [R07.89] 03/25/2013  . Colon cancer screening [Z12.11] 03/25/2013  . Knee pain [M25.569] 03/25/2013  . Marijuana use [F12.90] 07/20/2012  . Unspecified vitamin D deficiency [E55.9] 06/02/2012  . Pain [R52] 04/07/2012  . Elevated blood pressure (not hypertension) [R03.0] 04/06/2012  . 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 Subclinical hypothyroidism [E03.9] 02/06/2012  . Chronic pain syndrome [G89.4] 02/06/2012  . H/O vaginal surgery [Z98.890] 02/06/2012  . 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 Ileostomy status ([place]) [Z93.2] 02/06/2012  . 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 PTSD (post-traumatic stress disorder) [F43.10] 02/06/2012  . MDD (major depressive disorder)  ([place]) [F32.9] 02/06/2012   Total Time spent with patient: 15 minutes  Past Psychiatric History: See H&P  Past Medical History:  Past Medical History:  Diagnosis Date  . Angiomyolipoma    Left kidney  . Anxiety   . Arthritis   . Attention to urostomy [facility])   . Chronic pain   . Depression   . Hepatitis C    ? Contracted through IVDA  . Panic 04/29/1999  . PTSD (post-traumatic stress disorder)   . Substance abuse    Remote history- cocaine, ETOH, Marijuana  . Thyroid disease     Past Surgical History:  Procedure Laterality Date  . ABDOMINAL HYSTERECTOMY    . ABDOMINAL SURGERY    . CHOLECYSTECTOMY    . HERNIA REPAIR    . ILEO LOOP CONDUIT    . 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 VAGINA RECONSTRUCTION SURGERY     Family History:  Family History  Problem Relation Age of Onset  . Adopted: Yes  . Depression Mother   . Depression Sister   . Anxiety disorder Sister   . Bipolar disorder Sister   . Depression Maternal Grandmother   . ADD / ADHD Neg Hx   . Alcohol abuse Neg Hx   . Drug abuse Neg Hx   . Dementia Neg Hx   . OCD Neg Hx   . Paranoid behavior Neg Hx   . Schizophrenia Neg Hx   . Seizures Neg Hx   . Sexual abuse Neg Hx   . Physical abuse Neg Hx   . Suicidality Neg Hx   . Colon cancer Neg  Hx    Family Psychiatric  History: See H&P Social History:  History  Alcohol Use No    Comment: occasionally     History  Drug Use  . Types: Marijuana    Comment: cocaine, back in the 1980s    Social History   Social History  . Marital status: Married    Spouse name: N/A  . Number of children: 0  . Years of education: N/A   Occupational History  . disability    Social History Main Topics  . Smoking status: Former Smoker    Packs/day: 0.25    Years: 33.00    Types: Cigarettes    Quit date: 01/21/2015  . Smokeless tobacco: Never Used      Comment: 9-10 cigs a day as of 10/20/2012, (02-07-15 per pt, she stopped smoking 01-21-15)  . Alcohol use No     Comment: occasionally  . Drug use: Yes    Types: Marijuana     Comment: cocaine, back in the 1980s  . Sexual activity: Yes    Birth control/ protection: Surgical   Other Topics Concern  . None   Social History Narrative  . None   Additional Social History:    Pain Medications: see MAR Prescriptions: see MAR Over the Counter: see MAR History of alcohol / drug use?: Yes Longest period of sobriety (when/how long): Unknown Negative Consequences of Use: Personal relationships, Work / School Name of Substance 1: cannabis 1 - Age of First Use: 56 yr old  1 - Amount (size/oz): bowl- 2 hits 1 - Frequency: daily in the evening  1 - Duration: years 1 - Last Use / Amount: not in 5 days                  Sleep: Fair  Appetite:  Fair  Current Medications: Current Facility-Administered Medications  Medication Dose Route Frequency Provider Last Rate Last Dose  . acetaminophen (TYLENOL) tablet 650 mg  650 mg Oral Q6H PRN Laverle Hobby, PA-C      . ALPRAZolam Duanne Moron) tablet 0.5 mg  0.5 mg Oral QID Cobos, Myer Peer, MD   0.5 mg at 12/05/16 1205  . alum & mag hydroxide-simeth (MAALOX/MYLANTA) 200-200-20 MG/5ML suspension 30 mL  30 mL Oral Q4H PRN Patriciaann Clan E, PA-C      . hydrOXYzine (ATARAX/VISTARIL) tablet 50 mg  50 mg Oral Q6H PRN Money, Lowry Ram, FNP      . lamoTRIgine (LAMICTAL) tablet 25 mg  25 mg Oral BID Money, Darnelle Maffucci B, FNP      . magnesium hydroxide (MILK OF MAGNESIA) suspension 30 mL  30 mL Oral Daily PRN Laverle Hobby, PA-C      . traZODone (DESYREL) tablet 100 mg  100 mg Oral QHS PRN Cobos, Myer Peer, MD   100 mg at 12/04/16 2105    Lab Results:  Results for orders placed or performed during the hospital encounter of 12/03/16 (from the past 48 hour(s))  TSH     Status: Abnormal   Collection Time: 12/05/16  7:03 AM  Result Value Ref Range   TSH  9.591 (H) 0.350 - 4.500 uIU/mL    Comment: Performed by a 3rd Generation assay with a functional sensitivity of <=0.01 uIU/mL. Performed at [facility], [place] [address] [place], [place] [address]   Hemoglobin A1c     Status: None   Collection Time: 12/05/16  7:03 AM  Result Value Ref Range   Hgb A1c MFr Bld  5.2 4.8 - 5.6 %    Comment: (NOTE) Pre diabetes:          5.7%-6.4% Diabetes:              >6.4% Glycemic control for   <7.0% adults with diabetes    Mean Plasma Glucose 102.54 mg/dL    Comment: Performed at [facility], [place] [address], [place], [place] [address]  Lipid panel     Status: None   Collection Time: 12/05/16  7:03 AM  Result Value Ref Range   Cholesterol 129 0 - 200 mg/dL   Triglycerides 69 <150 mg/dL   HDL 45 >40 mg/dL   Total CHOL/HDL Ratio 2.9 RATIO   VLDL 14 0 - 40 mg/dL   LDL Cholesterol 70 0 - 99 mg/dL    Comment:        Total Cholesterol/HDL:CHD Risk Coronary Heart Disease Risk Table                     Men   Women  1/2 Average Risk   3.4   3.3  Average Risk       5.0   4.4  2 X Average Risk   9.6   7.1  3 X Average Risk  23.4   11.0        Use the calculated Patient Ratio above and the CHD Risk Table to determine the patient's CHD Risk.        ATP III CLASSIFICATION (LDL):  <100     mg/dL   Optimal  100-129  mg/dL   Near or Above                    Optimal  130-159  mg/dL   Borderline  160-189  mg/dL   High  >190     mg/dL   Very High Performed at [facility] [address], [place], [place] [address]     Blood Alcohol level:  Lab Results  Component Value Date   [facility] <5 12/02/2016   ETH <5 61/44/3154    Metabolic Disorder Labs: Lab Results  Component Value Date   HGBA1C 5.2 12/05/2016   MPG 102.54 12/05/2016   MPG 117 (H) 10/23/2014   No results found for: PROLACTIN Lab Results  Component Value Date   CHOL 129 12/05/2016   TRIG 69 12/05/2016   HDL 45 12/05/2016   CHOLHDL 2.9 12/05/2016    VLDL 14 12/05/2016   LDLCALC 70 12/05/2016   LDLCALC 107 (H) 10/23/2014    Physical Findings: AIMS:  , ,  ,  ,    CIWA:    COWS:     Musculoskeletal: Strength & Muscle Tone: within normal limits Gait & Station: normal Patient leans: N/A  Psychiatric Specialty Exam: Physical Exam  Nursing note and vitals reviewed. Constitutional: She is oriented to person, place, and time. She appears well-developed and well-nourished.  Musculoskeletal: Normal range of motion.  Neurological: She is alert and oriented to person, place, and time.    Review of Systems  Constitutional: Negative.   HENT: Negative.   Respiratory: Negative.   Cardiovascular: Negative.   Gastrointestinal: Negative.   Genitourinary: Negative.   Musculoskeletal: Negative.   Skin: Negative.   Neurological: Negative.   Endo/Heme/Allergies: Negative.     Blood pressure 111/81, pulse (!) 105, temperature 99.1 F (37.3 C), temperature source Oral, resp. rate 16, height 5\' 3"  (1.6 m), weight 73 kg (161 lb).Body mass index  is 28.52 kg/m.  General Appearance: Disheveled  Eye Contact:  Good  Speech:  Clear and Coherent  Volume:  Normal  Mood:  Anxious and Depressed  Affect:  Depressed and Labile  Thought Process:  Coherent and Descriptions of Associations: Intact  Orientation:  Full (Time, Place, and Person)  Thought Content:  Obsessions about her anxiety and anxiety medications  Suicidal Thoughts:  No  Homicidal Thoughts:  No  Memory:  Immediate;   Good Recent;   Good  Judgement:  Fair  Insight:  Fair  Psychomotor Activity:  Normal  Concentration:  Concentration: Good and Attention Span: Good  Recall:  Good  Fund of Knowledge:  Good  Language:  Good  Akathisia:  No  Handed:  Right  AIMS (if indicated):     Assets:  Financial Resources/Insurance Social Support  ADL's:  Intact  Cognition:  WNL  Sleep:  Number of Hours: 6     Treatment Plan Summary: Daily contact with patient to assess and evaluate  symptoms and progress in treatment, Medication management and Plan is to:  -Discontinue Cymbalta -Start Lamictal 25 mg PO BID for mood stability -Continue Xanax 0.5 mg PO QID for anxiety -Increase Vistaril 50 mg PO Q6H PRN anxiety -Continue Trazodone 100 mg PO QHS Prn for insomnia -Encourage group therapy participation  Lewis Shock, FNP 12/05/2016, 1:11 PM   Agree with NP Progress Note

## 2016-12-06 ENCOUNTER — Encounter (HOSPITAL_COMMUNITY): Payer: Self-pay | Admitting: Behavioral Health

## 2016-12-06 LAB — GLUCOSE, CAPILLARY: GLUCOSE-CAPILLARY: 131 mg/dL — AB (ref 65–99)

## 2016-12-06 MED ORDER — HYDROXYZINE HCL 25 MG PO TABS
ORAL_TABLET | ORAL | Status: AC
  Filled 2016-12-06: qty 1

## 2016-12-06 NOTE — Progress Notes (Signed)
NSG 7a-7p shift:   D:  Pt. Had initially been very anxious and fixated on a multitude of somatic complaints.  Her mood is labile and speech tangential.  She was tearful as she talked about her twin sister being diagnosed with ALS.  When patient was encouraged to explore triggers for her depression and anxiety, she shared that she was angry that her sister in law had talked to her brother (pt's husband), and had convinced him to not visit her in order to provide "tough love" for the patient.  She described her husband as someone who had always been very supportive of her in the past, and that she found herself feeling somewhat abandoned.  After speaking with this Probation officer, pt decided to call her husband to ask him if he would come to visit her today.  She later happily reported to this writer that he would be visiting today.  A: Support, education, and encouragement provided as needed.  Level 3 checks continued for safety.  R: Pt. receptive to intervention/s.  Safety maintained.  Prudencio Pair, RN

## 2016-12-06 NOTE — Progress Notes (Signed)
[facility] MD Progress Note  12/06/2016 10:58 AM Kathleen Palmer  MRN:  [id number]  Subjective:  " I feel hopless because they aren't giving me anything for pain."  Objective: Face to face evaluation completed and chart reviewed. During this evaluation patients mood is labile. She is very tearful at times and smiling at others. At times, she endorses no improvement in mood however then reports her mood stabilizer is very effective. She endorses feelings of hopelessness because she is not receiving any pain medication. She reports she can not take Tylenol. She appears to be very anxious. Endorses poor sleeping pattern and fair appetite. Denies current SI, HI or AVH. Endorses racing thoughts that are more prominent at bedtime yet she coukld not provide details of her thoughts. Describes withdrawal symptoms as, " I cant focus and I feel like I am coming out of my skin." Endorses a significant level of anxiety and hopelessness rating both as 10/10 however denies depressed mood. At this time, she is able to contract for safety on the unit.     Principal Problem: MDD (major depressive disorder), recurrent severe, without psychosis ([place]) Diagnosis:   Patient Active Problem List   Diagnosis Date Noted  . Substance induced mood disorder ([place]) [F19.94] 12/04/2016  . MDD (major depressive disorder), recurrent severe, without psychosis ([place]) [F33.2] 12/04/2016  . Family hx of ALS (amyotrophic lateral sclerosis) [Z82.0] 08/31/2015  . Chronic hepatitis C ([place]) [B18.2] 01/24/2014  . Osteopenia [M85.80] 12/02/2013  . OA (osteoarthritis) of knee [M17.10] 09/20/2013  . Clitoral irritation [N90.89] 08/01/2013  . Epidermoid cyst of skin [L72.0] 08/01/2013  . Atypical chest pain [R07.89] 03/25/2013  . Colon cancer screening [Z12.11] 03/25/2013  . Knee pain [M25.569] 03/25/2013  . Marijuana use [F12.90] 07/20/2012  . Unspecified vitamin D deficiency [E55.9] 06/02/2012  . Pain [R52] 04/07/2012  . Elevated blood pressure  (not hypertension) [R03.0] 04/06/2012  . 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 Subclinical hypothyroidism [E03.9] 02/06/2012  . Chronic pain syndrome [G89.4] 02/06/2012  . H/O vaginal surgery [Z98.890] 02/06/2012  . 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 Ileostomy status ([place]) [Z93.2] 02/06/2012  . 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 PTSD (post-traumatic stress disorder) [F43.10] 02/06/2012  . MDD (major depressive disorder) ([place]) [F32.9] 02/06/2012   Total Time spent with patient: 20 minutes  Past Psychiatric History: See H&P  Past Medical History:  Past Medical History:  Diagnosis Date  . Angiomyolipoma    Left kidney  . Anxiety   . Arthritis   . Attention to urostomy [facility])   . Chronic pain   . Depression   . Hepatitis C    ? Contracted through IVDA  . Panic 04/29/1999  . PTSD (post-traumatic stress disorder)   . Substance abuse    Remote history- cocaine, ETOH, Marijuana  . Thyroid disease     Past Surgical History:  Procedure Laterality Date  . ABDOMINAL HYSTERECTOMY    . ABDOMINAL SURGERY    . CHOLECYSTECTOMY    . HERNIA REPAIR    . ILEO LOOP CONDUIT    . 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 VAGINA RECONSTRUCTION SURGERY     Family History:  Family History  Problem Relation Age of Onset  . Adopted: Yes  . Depression Mother   . Depression Sister   . Anxiety disorder Sister   . Bipolar disorder Sister   .  Depression Maternal Grandmother   . ADD / ADHD Neg Hx   . Alcohol abuse Neg Hx   . Drug abuse Neg Hx   . Dementia Neg Hx   . OCD Neg Hx   . Paranoid behavior Neg Hx   . Schizophrenia Neg Hx   . Seizures Neg Hx   . Sexual abuse Neg Hx   . Physical abuse Neg Hx   . Suicidality Neg Hx   . Colon cancer Neg Hx    Family Psychiatric  History: See H&P Social History:  History  Alcohol Use No    Comment:  occasionally     History  Drug Use  . Types: Marijuana    Comment: cocaine, back in the 1980s    Social History   Social History  . Marital status: Married    Spouse name: N/A  . Number of children: 0  . Years of education: N/A   Occupational History  . disability    Social History Main Topics  . Smoking status: Former Smoker    Packs/day: 0.25    Years: 33.00    Types: Cigarettes    Quit date: 01/21/2015  . Smokeless tobacco: Never Used     Comment: 9-10 cigs a day as of 10/20/2012, (02-07-15 per pt, she stopped smoking 01-21-15)  . Alcohol use No     Comment: occasionally  . Drug use: Yes    Types: Marijuana     Comment: cocaine, back in the 1980s  . Sexual activity: Yes    Birth control/ protection: Surgical   Other Topics Concern  . None   Social History Narrative  . None   Additional Social History:    Pain Medications: see MAR Prescriptions: see MAR Over the Counter: see MAR History of alcohol / drug use?: Yes Longest period of sobriety (when/how long): Unknown Negative Consequences of Use: Personal relationships, Work / School Name of Substance 1: cannabis 1 - Age of First Use: 56 yr old  1 - Amount (size/oz): bowl- 2 hits 1 - Frequency: daily in the evening  1 - Duration: years 1 - Last Use / Amount: not in 5 days                  Sleep: Poor  Appetite:  Fair  Current Medications: Current Facility-Administered Medications  Medication Dose Route Frequency Provider Last Rate Last Dose  . acetaminophen (TYLENOL) tablet 650 mg  650 mg Oral Q6H PRN Laverle Hobby, PA-C      . ALPRAZolam Duanne Moron) tablet 0.5 mg  0.5 mg Oral QID Cobos, Myer Peer, MD   0.5 mg at 12/06/16 0846  . alum & mag hydroxide-simeth (MAALOX/MYLANTA) 200-200-20 MG/5ML suspension 30 mL  30 mL Oral Q4H PRN Patriciaann Clan E, PA-C      . hydrOXYzine (ATARAX/VISTARIL) 25 MG tablet           . hydrOXYzine (ATARAX/VISTARIL) tablet 50 mg  50 mg Oral Q6H PRN Money, Lowry Ram, FNP    25 mg at 12/05/16 2127  . lamoTRIgine (LAMICTAL) tablet 25 mg  25 mg Oral BID Money, Lowry Ram, FNP   25 mg at 12/06/16 0846  . magnesium hydroxide (MILK OF MAGNESIA) suspension 30 mL  30 mL Oral Daily PRN Laverle Hobby, PA-C   30 mL at 12/06/16 0848  . traZODone (DESYREL) tablet 100 mg  100 mg Oral QHS PRN Cobos, Myer Peer, MD   100 mg at 12/05/16 2127    Lab Results:  Results for orders placed or performed during the hospital encounter of 12/03/16 (from the past 48 hour(s))  TSH     Status: Abnormal   Collection Time: 12/05/16  7:03 AM  Result Value Ref Range   TSH 9.591 (H) 0.350 - 4.500 uIU/mL    Comment: Performed by a 3rd Generation assay with a functional sensitivity of <=0.01 uIU/mL. Performed at [facility], [place] [address] [place], [place] [address]   Hemoglobin A1c     Status: None   Collection Time: 12/05/16  7:03 AM  Result Value Ref Range   Hgb A1c MFr Bld 5.2 4.8 - 5.6 %    Comment: (NOTE) Pre diabetes:          5.7%-6.4% Diabetes:              >6.4% Glycemic control for   <7.0% adults with diabetes    Mean Plasma Glucose 102.54 mg/dL    Comment: Performed at [facility] [address], [place], [place] [address]  Lipid panel     Status: None   Collection Time: 12/05/16  7:03 AM  Result Value Ref Range   Cholesterol 129 0 - 200 mg/dL   Triglycerides 69 <150 mg/dL   HDL 45 >40 mg/dL   Total CHOL/HDL Ratio 2.9 RATIO   VLDL 14 0 - 40 mg/dL   LDL Cholesterol 70 0 - 99 mg/dL    Comment:        Total Cholesterol/HDL:CHD Risk Coronary Heart Disease Risk Table                     Men   Women  1/2 Average Risk   3.4   3.3  Average Risk       5.0   4.4  2 X Average Risk   9.6   7.1  3 X Average Risk  23.4   11.0        Use the calculated Patient Ratio above and the CHD Risk Table to determine the patient's CHD Risk.        ATP III CLASSIFICATION (LDL):  <100     mg/dL   Optimal  100-129  mg/dL   Near or Above                     Optimal  130-159  mg/dL   Borderline  160-189  mg/dL   High  >190     mg/dL   Very High Performed at [facility] [address] [place], [place] [address]   Glucose, capillary     Status: Abnormal   Collection Time: 12/06/16  5:37 AM  Result Value Ref Range   Glucose-Capillary 131 (H) 65 - 99 mg/dL    Blood Alcohol level:  Lab Results  Component Value Date   ETH <5 12/02/2016   ETH <5 32/44/0102    Metabolic Disorder Labs: Lab Results  Component Value Date   HGBA1C 5.2 12/05/2016   MPG 102.54 12/05/2016   MPG 117 (H) 10/23/2014   No results found for: PROLACTIN Lab Results  Component Value Date   CHOL 129 12/05/2016   TRIG 69 12/05/2016   HDL 45 12/05/2016   CHOLHDL 2.9 12/05/2016   VLDL 14 12/05/2016   LDLCALC 70 12/05/2016   LDLCALC 107 (H) 10/23/2014    Physical Findings: AIMS:  , ,  ,  ,    CIWA:    COWS:  Musculoskeletal: Strength & Muscle Tone: within normal limits Gait & Station: normal Patient leans: N/A  Psychiatric Specialty Exam: Physical Exam  Nursing note and vitals reviewed. Constitutional: She is oriented to person, place, and time. She appears well-developed and well-nourished.  Musculoskeletal: Normal range of motion.  Neurological: She is alert and oriented to person, place, and time.    Review of Systems  Constitutional: Negative.   HENT: Negative.   Respiratory: Negative.   Cardiovascular: Negative.   Gastrointestinal: Negative.   Genitourinary: Negative.   Musculoskeletal:       Hip pain   Skin: Negative.   Neurological: Negative.   Endo/Heme/Allergies: Negative.     Blood pressure 120/84, pulse 83, temperature 98.4 F (36.9 C), temperature source Oral, resp. rate 16, height 5\' 3"  (1.6 m), weight 161 lb (73 kg).Body mass index is 28.52 kg/m.  General Appearance: Disheveled  Eye Contact:  Good  Speech:  Clear and Coherent  Volume:  Normal  Mood:  Anxious and Depressed  Affect:  Depressed, Labile and Tearful   Thought Process:  Coherent and Descriptions of Associations: Intact  Orientation:  Full (Time, Place, and Person)  Thought Content:  Obsessions about her anxiety and anxiety medications  Suicidal Thoughts:  No  Homicidal Thoughts:  No  Memory:  Immediate;   Good Recent;   Good  Judgement:  Fair  Insight:  Fair  Psychomotor Activity:  Normal  Concentration:  Concentration: Good and Attention Span: Good  Recall:  Good  Fund of Knowledge:  Good  Language:  Good  Akathisia:  No  Handed:  Right  AIMS (if indicated):     Assets:  Financial Resources/Insurance Social Support  ADL's:  Intact  Cognition:  WNL  Sleep:  Number of Hours: 6.75     Treatment Plan Summary: Reviewed current treatment plan. Will continue the following without adjustments;    Daily contact with patient to assess and evaluate symptoms and progress in treatment, Medication management and Plan is to:   -Continue Lamictal 25 mg PO BID for mood stability -Continue Xanax 0.5 mg PO QID for anxiety -Conitnue Vistaril 50 mg PO Q6H PRN anxiety -Continue Trazodone 100 mg PO QHS Prn for insomnia -Continue group therapy participation  Mordecai Maes, NP 12/06/2016, 10:58 AMPatient ID: Kathleen Palmer, female   DOB: 13-Nov-1960, 56 y.o.   MRN: [id number]

## 2016-12-06 NOTE — Progress Notes (Signed)
Pt attend wrap up group. Her day was a 6. She wants to settle down with new medication and feel better. She trying to turn things around in her life.

## 2016-12-06 NOTE — BHH Group Notes (Signed)
[facility] LCSW Group Therapy Note  11/29/2016 10:15 to 11:15 AM  Type of Therapy and Topic:  Group Therapy: Avoiding Self-Sabotaging and Enabling Behaviors  Participation Level:  Active   Description of Group The main focus of today's process group to discuss what "self-sabotage" means and use motivational iInterviewing to discuss what benefits, negative or positive, were involved in a self-identified self-sabotaging behavior. We then talked about reasons the patient may want to change the behavior and their current desire to change.   Summary of Patient Progress: Patient engaged easily in group. 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 She processed challenges of change should mate choose not to change.    Therapeutic molalities: Cognitive Behavioral Therapy Person-Centered Therapy Motivational Interviewing  Therapeutic Goals: 1. Patients will demonstrate understanding of the concept of self sabotage 2. Patients will be able to identify pros and cons of their behaviors 3. Patients will be able to identify at least two motivating factors for l of their desire for change   Sheilah Pigeon, LCSW

## 2016-12-07 MED ORDER — HYDROXYZINE HCL 25 MG PO TABS
25.0000 mg | ORAL_TABLET | Freq: Four times a day (QID) | ORAL | Status: DC | PRN
  Administered 2016-12-08 – 2016-12-09 (×3): 25 mg via ORAL
  Filled 2016-12-07 (×3): qty 1

## 2016-12-07 NOTE — Progress Notes (Signed)
Patient did not attend the evening speaker [place] meeting. Pt attempted to go to group but reported too much pain and returned to room with ice packs.

## 2016-12-07 NOTE — Progress Notes (Signed)
Writer spoke with patient 1:1 and she report that she is going to try and turn her day around. She talks about her husband not coming to see her and him showing her tough love. She has been in amore positive upbeat mood tonight.

## 2016-12-07 NOTE — BHH Group Notes (Signed)
[place] LCSW Group Therapy Note   12/07/2016  At 10:15 to 11:10 AM   Type of Therapy and Topic: Group Therapy: Feelings Around Returning Home & Establishing a Supportive Framework and Developing a Healthier Routine  Participation Level:  Active   Description of Group:  Patients first processed thoughts and feelings about up coming discharge. These included fears of upcoming changes, lack of change, new living environments, judgements and expectations from others and overall stigma of MH issues. We then discussed what is a supportive framework? What does it look like feel like and how do I discern it from and unhealthy non-supportive network? Learn how to cope when supports are not helpful and don't support you. Discuss what to do when your family/friends are not supportive.   Therapeutic Goals Addressed in Processing Group:  1. Patient will identify one healthy supportive network that they can use at discharge. 2. Patient will identify one factor of a supportive framework and how to tell it from an unhealthy network. 3. Patient able to identify one coping skill to use when they do not have positive supports from others. 4. Patient will demonstrate ability to communicate their needs through discussion and/or role plays.  Summary of Patient Progress:  Pt engaged easily and was open to discussing topic of supports and self care. Patient was open to redirection when she became tangential on subject of religion.     Sheilah Pigeon, LCSW

## 2016-12-07 NOTE — Progress Notes (Signed)
  D) Pt has been attending the groups and interacting with her peers. Affect is flat and at times tearful, Mood depressed. States "I want to make this work. I don't want to be like this anymore. I know that my Husband is using tough love on me, and not visiting me. I understand and am not upset with this at all. I do want very much to make this work thought/ I want to stop using drugs. Rates her depression at a 7, hopelessness at a 0, and her anxiety at a 10. Denis SI and HI. Has been caring for her colostomy by herself. A) given support, reassurance and praise. Provided with a 1:1. Encouragement and praise given to Pt. Encouraged to keep working the program and doing the homework that was given to her. R) Pt denies SI and HI.

## 2016-12-07 NOTE — BHH Group Notes (Signed)
Adult Psychoeducational Group Note  Date:  12/07/2016 Time:  2:49 PM  Group Topic/Focus:  Relaxation   Participation Level: Participated Participation Quality: Good Affect: appropriate  Cognitive: appropriate  Insight:appropriate  Engagement in Group:  approopriate  Modes of Intervention:  appropriate  Additional Comments: Paulino Rily 12/07/2016, 2:49 PM

## 2016-12-07 NOTE — Progress Notes (Signed)
[facility] MD Progress Note  12/07/2016 1:05 PM Kathleen Palmer  MRN:  [id number]   Subjective:  Kathleen Palmer reports " I am doing and feeling better, just trying not to cry today."  Objective: Kathleen Palmer is awake, alert and oriented *3.Seen standing in the hall using walker for ambulation assistance. Denies suicidal or homicidal ideation during this assessment. Denies auditory or visual hallucination and does not appear to be responding to internal stimuli.  Patient is tearful and remorseful throughout this assessment. Patient reports she is medication compliant without mediation side effects,  States she feel that the vistaril 50 mg  is helping a lot with her anxiety. Reports she will asked her PCP after she discharge for nonnarcotic medications only. Reports her depression 8/10, however reports she all has a tough morning. Patient reports she is hopeful that her husband with seek treatment for his addiction as well. Support, encouragement and reassurance was provided.   Principal Problem: MDD (major depressive disorder), recurrent severe, without psychosis ([place]) Diagnosis:   Patient Active Problem List   Diagnosis Date Noted  . Substance induced mood disorder ([place]) [F19.94] 12/04/2016  . MDD (major depressive disorder), recurrent severe, without psychosis ([place]) [F33.2] 12/04/2016  . Family hx of ALS (amyotrophic lateral sclerosis) [Z82.0] 08/31/2015  . Chronic hepatitis C ([place]) [B18.2] 01/24/2014  . Osteopenia [M85.80] 12/02/2013  . OA (osteoarthritis) of knee [M17.10] 09/20/2013  . Clitoral irritation [N90.89] 08/01/2013  . Epidermoid cyst of skin [L72.0] 08/01/2013  . Atypical chest pain [R07.89] 03/25/2013  . Colon cancer screening [Z12.11] 03/25/2013  . Knee pain [M25.569] 03/25/2013  . Marijuana use [F12.90] 07/20/2012  . Unspecified vitamin D deficiency [E55.9] 06/02/2012  . Pain [R52] 04/07/2012  . Elevated blood pressure (not hypertension) [R03.0] 04/06/2012  . Insomnia due to mental disorder  [F51.05] 03/05/2012  . Subclinical hypothyroidism [E03.9] 02/06/2012  . Chronic pain syndrome [G89.4] 02/06/2012  . H/O vaginal surgery [Z98.890] 02/06/2012  . 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 Ileostomy status ([place]) [Z93.2] 02/06/2012  . 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 PTSD (post-traumatic stress disorder) [F43.10] 02/06/2012  . MDD (major depressive disorder) ([place]) [F32.9] 02/06/2012   Total Time spent with patient: 15 minutes  Past Psychiatric History: See H&P  Past Medical History:  Past Medical History:  Diagnosis Date  . Angiomyolipoma    Left kidney  . Anxiety   . Arthritis   . Attention to urostomy [facility])   . Chronic pain   . Depression   . Hepatitis C    ? Contracted through IVDA  . Panic 04/29/1999  . PTSD (post-traumatic stress disorder)   . Substance abuse    Remote history- cocaine, ETOH, Marijuana  . Thyroid disease     Past Surgical History:  Procedure Laterality Date  . ABDOMINAL HYSTERECTOMY    . ABDOMINAL SURGERY    . CHOLECYSTECTOMY    . HERNIA REPAIR    . ILEO LOOP CONDUIT    . 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 VAGINA RECONSTRUCTION SURGERY     Family History:  Family History  Problem Relation Age of Onset  . Adopted: Yes  . Depression Mother   . Depression Sister   . Anxiety disorder Sister   . Bipolar disorder Sister   . Depression Maternal Grandmother   . ADD / ADHD Neg Hx   . Alcohol abuse Neg Hx   .  Drug abuse Neg Hx   . Dementia Neg Hx   . OCD Neg Hx   . Paranoid behavior Neg Hx   . Schizophrenia Neg Hx   . Seizures Neg Hx   . Sexual abuse Neg Hx   . Physical abuse Neg Hx   . Suicidality Neg Hx   . Colon cancer Neg Hx    Family Psychiatric  History: See H&P Social History:  History  Alcohol Use No    Comment: occasionally     History  Drug Use  . Types: Marijuana    Comment:  cocaine, back in the 1980s    Social History   Social History  . Marital status: Married    Spouse name: N/A  . Number of children: 0  . Years of education: N/A   Occupational History  . disability    Social History Main Topics  . Smoking status: Former Smoker    Packs/day: 0.25    Years: 33.00    Types: Cigarettes    Quit date: 01/21/2015  . Smokeless tobacco: Never Used     Comment: 9-10 cigs a day as of 10/20/2012, (02-07-15 per pt, she stopped smoking 01-21-15)  . Alcohol use No     Comment: occasionally  . Drug use: Yes    Types: Marijuana     Comment: cocaine, back in the 1980s  . Sexual activity: Yes    Birth control/ protection: Surgical   Other Topics Concern  . None   Social History Narrative  . None   Additional Social History:    Pain Medications: see MAR Prescriptions: see MAR Over the Counter: see MAR History of alcohol / drug use?: Yes Longest period of sobriety (when/how long): Unknown Negative Consequences of Use: Personal relationships, Work / School Name of Substance 1: cannabis 1 - Age of First Use: 56 yr old  1 - Amount (size/oz): bowl- 2 hits 1 - Frequency: daily in the evening  1 - Duration: years 1 - Last Use / Amount: not in 5 days                  Sleep: Fair  Appetite:  Fair  Current Medications: Current Facility-Administered Medications  Medication Dose Route Frequency Provider Last Rate Last Dose  . acetaminophen (TYLENOL) tablet 650 mg  650 mg Oral Q6H PRN Laverle Hobby, PA-C      . ALPRAZolam Duanne Moron) tablet 0.5 mg  0.5 mg Oral QID Cobos, Myer Peer, MD   0.5 mg at 12/07/16 1209  . alum & mag hydroxide-simeth (MAALOX/MYLANTA) 200-200-20 MG/5ML suspension 30 mL  30 mL Oral Q4H PRN Patriciaann Clan E, PA-C      . hydrOXYzine (ATARAX/VISTARIL) tablet 50 mg  50 mg Oral Q6H PRN Money, Lowry Ram, FNP   50 mg at 12/07/16 1114  . lamoTRIgine (LAMICTAL) tablet 25 mg  25 mg Oral BID Money, Lowry Ram, FNP   25 mg at 12/07/16 0800  .  magnesium hydroxide (MILK OF MAGNESIA) suspension 30 mL  30 mL Oral Daily PRN Laverle Hobby, PA-C   30 mL at 12/06/16 0848  . traZODone (DESYREL) tablet 100 mg  100 mg Oral QHS PRN Cobos, Myer Peer, MD   100 mg at 12/06/16 2110    Lab Results:  Results for orders placed or performed during the hospital encounter of 12/03/16 (from the past 48 hour(s))  Glucose, capillary     Status: Abnormal   Collection Time: 12/06/16  5:37 AM  Result  Value Ref Range   Glucose-Capillary 131 (H) 65 - 99 mg/dL    Blood Alcohol level:  Lab Results  Component Value Date   ETH <5 12/02/2016   ETH <5 07/37/1062    Metabolic Disorder Labs: Lab Results  Component Value Date   HGBA1C 5.2 12/05/2016   MPG 102.54 12/05/2016   MPG 117 (H) 10/23/2014   No results found for: PROLACTIN Lab Results  Component Value Date   CHOL 129 12/05/2016   TRIG 69 12/05/2016   HDL 45 12/05/2016   CHOLHDL 2.9 12/05/2016   VLDL 14 12/05/2016   LDLCALC 70 12/05/2016   LDLCALC 107 (H) 10/23/2014    Physical Findings: AIMS:  , ,  ,  ,    CIWA:    COWS:     Musculoskeletal: Strength & Muscle Tone: within normal limits Gait & Station: normal Patient leans: N/A  Psychiatric Specialty Exam: Physical Exam  Nursing note and vitals reviewed. Constitutional: She is oriented to person, place, and time. She appears well-developed and well-nourished.  Musculoskeletal: Normal range of motion.  Neurological: She is alert and oriented to person, place, and time.    Review of Systems  Constitutional: Negative.   HENT: Negative.   Respiratory: Negative.   Cardiovascular: Negative.   Gastrointestinal: Negative.   Genitourinary: Negative.   Musculoskeletal: Negative.   Skin: Negative.   Neurological: Negative.   Endo/Heme/Allergies: Negative.     Blood pressure (!) 121/6, pulse (!) 101, temperature 98.3 F (36.8 C), temperature source Oral, resp. rate 18, height 5\' 3"  (1.6 m), weight 73 kg (161 lb).Body mass  index is 28.52 kg/m.  General Appearance: Casual and Guarded   Eye Contact:  Good  Speech:  Clear and Coherent  Volume:  Normal  Mood:  Anxious and Depressed  Affect:  Depressed and Labile  Thought Process:  Coherent  Orientation:  Full (Time, Place, and Person)  Thought Content:  Rumination with medication and anxiety   Suicidal Thoughts:  No  Homicidal Thoughts:  No  Memory:  Immediate;   Fair Recent;   Fair Remote;   Fair  Judgement:  Fair  Insight:  Fair  Psychomotor Activity:  Normal  Concentration:  Concentration: Fair and Attention Span: Fair  Recall:  Good  Fund of Knowledge:  Good  Language:  Good  Akathisia:  No  Handed:  Right  AIMS (if indicated):     Assets:  Desire for Improvement Financial Resources/Insurance Intimacy Social Support  ADL's:  Intact  Cognition:  WNL  Sleep:  Number of Hours: 6.25     I agree with current treatment plan on 12/07/2016, Patient seen face-to-face for psychiatric evaluation follow-up, chart reviewed. Reviewed the information documented and agree with the treatment plan.  Treatment Plan Summary: Daily contact with patient to assess and evaluate symptoms and progress in treatment, Medication management and Plan is to:   Continue with current treatment plan 12/07/2016 except where noted  -Discontinue Cymbalta -Start Lamictal 25 mg PO BID for mood stability -Continue Xanax 0.5 mg PO QID for anxiety Continue Vistaril 50 mg PO Q6H PRN anxiety -Continue Trazodone 100 mg PO QHS Prn for insomnia -Encourage group therapy participation  [place]ill [place], NP 12/07/2016, 1:05 PM

## 2016-12-07 NOTE — BHH Suicide Risk Assessment (Signed)
[facility] INPATIENT:  Family/Significant Other Suicide Prevention Education  Suicide Prevention Education:  Education Completed; husband Kathleen Palmer [phone] has been identified by the patient as the family member/significant other with whom the patient will be residing, and identified as the person(s) who will aid the patient in the event of a mental health crisis (suicidal ideations/suicide attempt).  With written consent from the patient, the family member/significant other has been provided the following suicide prevention education, prior to the and/or following the discharge of the patient.  HUSBAND STATED THAT PT HAS CALLED HIM STATING THE MEDICINE SHE IS TAKING IS MAKING HER DIZZY.  HE STATED THERE ARE NO FIREARMS IN THE HOME.  THERE IS A HISTORY OF DEPRESSION IN THE FAMILY, WITH PT'S GRANDMOTHER COMMITTING SUICIDE.  HE ALSO STATED PT'S TWIN SISTER IS DYING OF ALS CURRENTLY AND SHE IS HAVING A HARD TIME DEALING WITH THAT.  HE STATED PT'S FAITH IS VERY IMPORTANT TO HER AND SHE WOULD "NEVER DO ANYTHING CRAZY LIKE SUICIDE."  CSW PROVIDES INTENSIVE EDUCATION TO DISCOUNT THAT MYTH, ENCOURAGED HIM TO ASK PT'S PERMISSION TO GO WITH HER TO SPEAK WITH DOCTOR AND THERAPIST AND LEARN MORE ABOUT THE ILLNESS OF DEPRESSION.  The suicide prevention education provided includes the following:  Suicide risk factors  Suicide prevention and interventions  National Suicide Hotline telephone number  [facility] assessment telephone number  [facility] Emergency Assistance [facility] and/or Residential Mobile Crisis Unit telephone number  Request made of family/significant other to:  Remove weapons (e.g., guns, rifles, knives), all items previously/currently identified as safety concern.    Remove drugs/medications (over-the-counter, prescriptions, illicit drugs), all items previously/currently identified as a safety concern.  The family member/significant other verbalizes  understanding of the suicide prevention education information provided.  The family member/significant other agrees to remove the items of safety concern listed above.  Kathleen Palmer 12/07/2016, 1:58 PM

## 2016-12-07 NOTE — Progress Notes (Signed)
Writer spoke with patient 1:1 and she reports that she feels better mentally but psychically she has been in pain in her back. She was offered tylenol but refused because she reports that she is going to be starting Hep. C treatment soon. Ice packs were given for relief and she was receptive with that. She reported that she spoke with her husband on the phone and smiled saying he loves me. She was informed of medications scheduled and requested them around 9 pm. Support givne and safety maintained on unit with 15 min checks.

## 2016-12-08 MED ORDER — IBUPROFEN 400 MG PO TABS
400.0000 mg | ORAL_TABLET | Freq: Four times a day (QID) | ORAL | Status: DC | PRN
  Administered 2016-12-08 – 2016-12-09 (×3): 400 mg via ORAL
  Filled 2016-12-08 (×3): qty 1

## 2016-12-08 NOTE — Progress Notes (Signed)
  [facility] Adult Case Management Discharge Plan :  Will you be returning to the same living situation after discharge:  Yes,  home At discharge, do you have transportation home?: Yes,  husband will pick her up at [place] on Tuesday. pt has follow-up appt with her therapist in [place] at [facility]. Do you have the ability to pay for your medications: Yes,  medicare  Release of information consent forms completed and submitted to medical records by CSW.  Patient to Follow up at: Follow-up Information    [facility] Follow up on 12/09/2016.   Specialty:  Behavioral Health Why:  Appt for therapy with Arrie [facility] on Tuesday at 11am. Your next appt with [name] for medication management is Wednesday, 12/10/16 at 11am. Thank you.   Contact information: [address] [place] [phone]          Next level of care provider has access to [place] and Suicide Prevention discussed: Yes,  SPE completed with both pt and her husband.  Have you used any form of tobacco in the last 30 days? (Cigarettes, Smokeless Tobacco, Cigars, and/or Pipes): No  Has patient been referred to the Quitline?: N/A patient is not a smoker  Patient has been referred for addiction treatment: Yes  [employer], LCSW 12/08/2016, 10:30 AM

## 2016-12-08 NOTE — Progress Notes (Signed)
Pt attend wrap up group. Her day was a 8. Her goal attend all groups. She lay in bed a lot today. She meet her goal. She's leaving 9:00 tomorrow and will follow up to get better.

## 2016-12-08 NOTE — Plan of Care (Signed)
Problem: Safety: Goal: Periods of time without injury will increase Outcome: Completed/Met Date Met: 12/08/16 Patient is not engaging in self harm, denies thoughts to do so. No SI verbalized.  Problem: Medication: Goal: Compliance with prescribed medication regimen will improve Outcome: Completed/Met Date Met: 12/08/16 Patient remains med compliant.

## 2016-12-08 NOTE — Progress Notes (Addendum)
D: Patient observed up and around unit. Ambulating with walker, slow and unsteady at times. Frequent contacts made 1:1 throughout morning. Patient verbalizes to this writer her pain is pronounced. She also spoke of her stressors - her twin sister's terminal illness and inability to talk (patient able to text with sister). States she is beginning to accept sister's fate. [name] affect flat, anxious with congruent mood. Per self inventory and discussions with writer, rates depression at an 8/10, hopelessness at a 5/10 and anxiety at a 9/10. Rates sleep as fair, appetite as good, energy as low and concentration as poor.  States goal for today is to "learn to cope each day, go to group." Continues to struggle with chronic generalized pain, especially now that her body is clearing substances she was using to self medicate. No other physical complaints.   A: Medicated per orders, prn advil given for pain rated at a 9/10. Level III obs in place for safety. Emotional support offered and self inventory reviewed. Suicide Safety Plan completed and on chart. Encouraged programming participation. Discussed POC with MD, SW.  Fall prevention plan in place (however unit currently out of yellow bands) and reviewed with patient as pt is a high fall risk due to unsteady gait and chronic pain.   R: Patient verbalizes understanding of POC, falls prevention education. Will reassess pain at 1 hour mark. Patient denies SI/HI/AVH however does not feel ready for discharge. "I'm getting there but I'm still hopeless." Patient remains safe on level III obs. Will continue to monitor closely and make verbal contact frequently.

## 2016-12-08 NOTE — Progress Notes (Signed)
[facility] MD Progress Note  12/08/2016 1:16 PM NYX KEADY  MRN:  [id number]   Subjective:  Patient states she is feeling better, and as she improves she is becoming more future oriented. At this time she is hoping for discharge soon.  Continues to speak about psychosocial stressors,mainly concern about her sister who has been diagnosed with ALS. Denies medication side effects at this time. Reports she feels her mood is improving and is less depressed . She continues to report chronic pain as a major issue- at this time presents calm, in no acute distress or discomfort .  Objective:  I have discussed case with treatment team and have met with patient . Patient is presenting with improvement compared to admission, affect presents more reactive, and is future oriented. Currently denies any suicidal ideations, and is hoping to be discharged soon. She denies medication side effects, and is tolerating medications well . She has tolerated Xanax taper without symptoms of withdrawal . Vitals are currently stable, no psychomotor agitation or diaphoresis. Limited group participation, no disruptive or agitated behaviors on unit.  Principal Problem: MDD (major depressive disorder), recurrent severe, without psychosis ([facility]) Diagnosis:   Patient Active Problem List   Diagnosis Date Noted  . Substance induced mood disorder ([place]) [F19.94] 12/04/2016  . MDD (major depressive disorder), recurrent severe, without psychosis ([place]) [F33.2] 12/04/2016  . Family hx of ALS (amyotrophic lateral sclerosis) [Z82.0] 08/31/2015  . Chronic hepatitis C ([place]) [B18.2] 01/24/2014  . Osteopenia [M85.80] 12/02/2013  . OA (osteoarthritis) of knee [M17.10] 09/20/2013  . Clitoral irritation [N90.89] 08/01/2013  . Epidermoid cyst of skin [L72.0] 08/01/2013  . Atypical chest pain [R07.89] 03/25/2013  . Colon cancer screening [Z12.11] 03/25/2013  . Knee pain [M25.569] 03/25/2013  . Marijuana use [F12.90] 07/20/2012  . Unspecified  vitamin D deficiency [E55.9] 06/02/2012  . Pain [R52] 04/07/2012  . Elevated blood pressure (not hypertension) [R03.0] 04/06/2012  . 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 Subclinical hypothyroidism [E03.9] 02/06/2012  . Chronic pain syndrome [G89.4] 02/06/2012  . H/O vaginal surgery [Z98.890] 02/06/2012  . 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 Ileostomy status ([place]) [Z93.2] 02/06/2012  . 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 PTSD (post-traumatic stress disorder) [F43.10] 02/06/2012  . MDD (major depressive disorder) ([place]) [F32.9] 02/06/2012   Total Time spent with patient: 20 minutes  Past Psychiatric History: See H&P  Past Medical History:  Past Medical History:  Diagnosis Date  . Angiomyolipoma    Left kidney  . Anxiety   . Arthritis   . Attention to urostomy [facility])   . Chronic pain   . Depression   . Hepatitis C    ? Contracted through IVDA  . Panic 04/29/1999  . PTSD (post-traumatic stress disorder)   . Substance abuse    Remote history- cocaine, ETOH, Marijuana  . Thyroid disease     Past Surgical History:  Procedure Laterality Date  . ABDOMINAL HYSTERECTOMY    . ABDOMINAL SURGERY    . CHOLECYSTECTOMY    . HERNIA REPAIR    . ILEO LOOP CONDUIT    . 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 VAGINA RECONSTRUCTION SURGERY     Family History:  Family History  Problem Relation Age of Onset  . Adopted: Yes  . Depression Mother   . Depression Sister   . Anxiety disorder Sister   . Bipolar  disorder Sister   . Depression Maternal Grandmother   . ADD / ADHD Neg Hx   . Alcohol abuse Neg Hx   . Drug abuse Neg Hx   . Dementia Neg Hx   . OCD Neg Hx   . Paranoid behavior Neg Hx   . Schizophrenia Neg Hx   . Seizures Neg Hx   . Sexual abuse Neg Hx   . Physical abuse Neg Hx   . Suicidality Neg Hx   . Colon cancer Neg Hx     Family Psychiatric  History: See H&P Social History:  History  Alcohol Use No    Comment: occasionally     History  Drug Use  . Types: Marijuana    Comment: cocaine, back in the 1980s    Social History   Social History  . Marital status: Married    Spouse name: N/A  . Number of children: 0  . Years of education: N/A   Occupational History  . disability    Social History Main Topics  . Smoking status: Former Smoker    Packs/day: 0.25    Years: 33.00    Types: Cigarettes    Quit date: 01/21/2015  . Smokeless tobacco: Never Used     Comment: 9-10 cigs a day as of 10/20/2012, (02-07-15 per pt, she stopped smoking 01-21-15)  . Alcohol use No     Comment: occasionally  . Drug use: Yes    Types: Marijuana     Comment: cocaine, back in the 1980s  . Sexual activity: Yes    Birth control/ protection: Surgical   Other Topics Concern  . None   Social History Narrative  . None   Additional Social History:    Pain Medications: see MAR Prescriptions: see MAR Over the Counter: see MAR History of alcohol / drug use?: Yes Longest period of sobriety (when/how long): Unknown Negative Consequences of Use: Personal relationships, Work / School Name of Substance 1: cannabis 1 - Age of First Use: 56 yr old  1 - Amount (size/oz): bowl- 2 hits 1 - Frequency: daily in the evening  1 - Duration: years 1 - Last Use / Amount: not in 5 days  Sleep: Fair- improving   Appetite:  Fair- improving   Current Medications: Current Facility-Administered Medications  Medication Dose Route Frequency Provider Last Rate Last Dose  . ALPRAZolam Duanne Moron) tablet 0.5 mg  0.5 mg Oral QID Cobos, Myer Peer, MD   0.5 mg at 12/08/16 1205  . alum & mag hydroxide-simeth (MAALOX/MYLANTA) 200-200-20 MG/5ML suspension 30 mL  30 mL Oral Q4H PRN Patriciaann Clan E, PA-C      . hydrOXYzine (ATARAX/VISTARIL) tablet 25 mg  25 mg Oral Q6H PRN Cobos, Myer Peer, MD   25 mg at 12/08/16 0531  . ibuprofen  (ADVIL,MOTRIN) tablet 400 mg  400 mg Oral Q6H PRN Cobos, Myer Peer, MD   400 mg at 12/08/16 1002  . lamoTRIgine (LAMICTAL) tablet 25 mg  25 mg Oral BID Money, Lowry Ram, FNP   25 mg at 12/08/16 0810  . magnesium hydroxide (MILK OF MAGNESIA) suspension 30 mL  30 mL Oral Daily PRN Laverle Hobby, PA-C   30 mL at 12/07/16 1528  . traZODone (DESYREL) tablet 100 mg  100 mg Oral QHS PRN Cobos, Myer Peer, MD   100 mg at 12/07/16 2107    Lab Results:  No results found for this or any previous visit (from the past 40 hour(s)).  Blood Alcohol level:  Lab Results  Component Value Date   ETH <5 12/02/2016   ETH <5 98/33/8250    Metabolic Disorder Labs: Lab Results  Component Value Date   HGBA1C 5.2 12/05/2016   MPG 102.54 12/05/2016   MPG 117 (H) 10/23/2014   No results found for: PROLACTIN Lab Results  Component Value Date   CHOL 129 12/05/2016   TRIG 69 12/05/2016   HDL 45 12/05/2016   CHOLHDL 2.9 12/05/2016   VLDL 14 12/05/2016   LDLCALC 70 12/05/2016   LDLCALC 107 (H) 10/23/2014    Physical Findings: AIMS:  , ,  ,  ,    CIWA:    COWS:     Musculoskeletal: Strength & Muscle Tone: within normal limits Gait & Station: normal Patient leans: N/A  Psychiatric Specialty Exam: Physical Exam  Nursing note and vitals reviewed. Constitutional: She is oriented to person, place, and time. She appears well-developed and well-nourished.  Musculoskeletal: Normal range of motion.  Neurological: She is alert and oriented to person, place, and time.    Review of Systems  Constitutional: Negative.   HENT: Negative.   Respiratory: Negative.   Cardiovascular: Negative.   Gastrointestinal: Negative.   Genitourinary: Negative.   Musculoskeletal: Negative.   Skin: Negative.   Neurological: Negative.   Endo/Heme/Allergies: Negative.   Reports chronic pain, no shortness of breath, no chest pain   Blood pressure 123/77, pulse 87, temperature 98.2 F (36.8 C), temperature source Oral,  resp. rate 18, height _0  (1.6 m), weight 73 kg (161 lb).Body mass index is 28.52 kg/m.  General Appearance: fairly groomed    Eye Contact:  Good  Speech:  Normal Rate  Volume:  Normal  Mood:  improving, less depressed  Affect:  more reactive, smiles at times appropriately   Thought Process:  Linear and Descriptions of Associations: Intact  Orientation:  Full (Time, Place, and Person)  Thought Content:  Rumination with medication and anxiety   Suicidal Thoughts:  No denies suicidal or self injurious ideations at this time, denies homicidal or violent ideations  Homicidal Thoughts:  No  Memory:  Recent and remote grossly intact   Judgement:  Fair- improving   Insight:  fair- improving   Psychomotor Activity:  Normal  Concentration:  Concentration: Good and Attention Span: Good  Recall:  Good  Fund of Knowledge:  Good  Language:  Good  Akathisia:  No  Handed:  Right  AIMS (if indicated):     Assets:  Desire for Improvement Financial Resources/Insurance Intimacy Social Support  ADL's:  Intact  Cognition:  WNL  Sleep:  Number of Hours: 5   Assessment - patient is presenting with partially improved mood and range of affect. At this time denies SI. As she improves she is becoming more future oriented, and currently hoping for discharge home soon. Tolerating Xanax dose decrease well. No symptoms of WDL .   Treatment Plan Summary: Treatment plan reviewed as below today 8/13  Daily contact with patient to assess and evaluate symptoms and progress in treatment, Medication management and Plan is to:  Encourage group and milieu participation to work on coping skills and symptom reduction Continue Lamictal 25 mg PO BID for mood stability Continue Xanax 0.5 mg PO QID for anxiety Continue Vistaril 25  mg PO Q6H PRN anxiety Continue Trazodone 100 mg PO QHS PRN for insomnia Treatment team working on disposition planning options   Jenne Campus, MD 12/08/2016, 1:16 PM   Patient ID:  [name], female  DOB: 08/13/1960, 56 y.o.   MRN: [id number]

## 2016-12-08 NOTE — BHH Group Notes (Signed)
[facility] LCSW Group Therapy  12/08/2016 3:25 PM  Type of Therapy:  Group Therapy  Participation Level:  Did Not Attend-pt invited. Chose to remain in bed.   Summary of Progress/Problems: Today's Topic: Overcoming Obstacles. Patients identified one short term goal and potential obstacles in reaching this goal. Patients processed barriers involved in overcoming these obstacles. Patients identified steps necessary for overcoming these obstacles and explored motivation (internal and external) for facing these difficulties head on.   Levone Otten N Smart LCSW 12/08/2016, 3:25 PM

## 2016-12-08 NOTE — Plan of Care (Signed)
Problem: Medication: Goal: Compliance with prescribed medication regimen will improve Outcome: Progressing Patient has been compliant with scheduled medications and verbalizes when in need of prns available.

## 2016-12-09 ENCOUNTER — Ambulatory Visit (INDEPENDENT_AMBULATORY_CARE_PROVIDER_SITE_OTHER): Payer: Medicare Other | Admitting: Licensed Clinical Social Worker

## 2016-12-09 DIAGNOSIS — F332 Major depressive disorder, recurrent severe without psychotic features: Secondary | ICD-10-CM | POA: Diagnosis not present

## 2016-12-09 MED ORDER — LAMOTRIGINE 25 MG PO TABS: 25.0000 mg | ORAL_TABLET | Freq: Two times a day (BID) | ORAL | 0 refills | Status: DC

## 2016-12-09 MED ORDER — HYDROXYZINE HCL 25 MG PO TABS: 25.0000 mg | ORAL_TABLET | Freq: Four times a day (QID) | ORAL | 0 refills | Status: DC | PRN

## 2016-12-09 MED ORDER — TRAZODONE HCL 100 MG PO TABS: 100.0000 mg | ORAL_TABLET | Freq: Every evening | ORAL | 0 refills | Status: DC | PRN

## 2016-12-09 MED ORDER — ALPRAZOLAM 0.5 MG PO TABS: 0.5000 mg | ORAL_TABLET | Freq: Four times a day (QID) | ORAL | 0 refills | Status: DC

## 2016-12-09 NOTE — BHH Suicide Risk Assessment (Addendum)
[facility] Discharge Suicide Risk Assessment   Principal Problem: MDD (major depressive disorder), recurrent severe, without psychosis ([place]) Discharge Diagnoses:  Patient Active Problem List   Diagnosis Date Noted  . Substance induced mood disorder ([place]) [F19.94] 12/04/2016  . MDD (major depressive disorder), recurrent severe, without psychosis ([place]) [F33.2] 12/04/2016  . Family hx of ALS (amyotrophic lateral sclerosis) [Z82.0] 08/31/2015  . Chronic hepatitis C ([place]) [B18.2] 01/24/2014  . Osteopenia [M85.80] 12/02/2013  . OA (osteoarthritis) of knee [M17.10] 09/20/2013  . Clitoral irritation [N90.89] 08/01/2013  . Epidermoid cyst of skin [L72.0] 08/01/2013  . Atypical chest pain [R07.89] 03/25/2013  . Colon cancer screening [Z12.11] 03/25/2013  . Knee pain [M25.569] 03/25/2013  . Marijuana use [F12.90] 07/20/2012  . Unspecified vitamin D deficiency [E55.9] 06/02/2012  . Pain [R52] 04/07/2012  . Elevated blood pressure (not hypertension) [R03.0] 04/06/2012  . 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 Subclinical hypothyroidism [E03.9] 02/06/2012  . Chronic pain syndrome [G89.4] 02/06/2012  . H/O vaginal surgery [Z98.890] 02/06/2012  . 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 Ileostomy status ([place]) [Z93.2] 02/06/2012  . 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 PTSD (post-traumatic stress disorder) [F43.10] 02/06/2012  . MDD (major depressive disorder) ([place]) [F32.9] 02/06/2012    Total Time spent with patient: 30 minutes  Musculoskeletal: Strength & Muscle Tone: within normal limits Gait & Station: normal Patient leans: N/A  Psychiatric Specialty Exam: ROS denies headache, no chest pain, no shortness of breath, no vomiting, chronic pelvic pain, no rash, no fever   Blood pressure (!) 123/97, pulse 88, temperature 97.7 F (36.5 C), resp. rate 18, height 5\' 3"  (1.6 m), weight 73 kg (161 lb).Body mass index is 28.52 kg/m.  General Appearance: Well Groomed  Eye  Contact::  Good  Speech:  Normal Rate409  Volume:  Normal  Mood:  reports improved mood " feeling good today"  Affect:  Appropriate and more reactive   Thought Process:  Linear and Descriptions of Associations: Intact  Orientation:  Full (Time, Place, and Person)  Thought Content:  no hallucinations, no delusions,  Suicidal Thoughts:  No denies any suicidal or self injurious ideations  Homicidal Thoughts:  No  Memory:  recent and remote grossly intact   Judgement:  Other:  improved  Insight:  improved   Psychomotor Activity:  Normal  Concentration:  Good  Recall:  Good  Fund of Knowledge:Good  Language: Good  Akathisia:  No  Handed:  Right  AIMS (if indicated):     Assets:  Communication Skills Desire for Improvement Resilience  Sleep:  Number of Hours: 5  Cognition: WNL  ADL's:  Intact   Mental Status Per Nursing Assessment::   On Admission:     Demographic Factors:  Married, lives with husband, no children, on disability   Loss Factors: History of chronic pain , sister diagnosed with ALS   Historical Factors: History of depression, history of long term BZD and Opiate management   Risk Reduction Factors:   Sense of responsibility to family, Living with another person, especially a relative and Positive coping skills or problem solving skills  Continued Clinical Symptoms:  At this time patient is alert, attentive, well related, calm, mood is improved, states she feels well at this time and denies depression at present, affect reactive, fuller in range, no thought disorder, no suicidal or self injurious ideations, no homicidal ideations, no psychotic symptoms, future oriented. Behavior on unit calm and in good control. No current withdrawal symptoms. Denies medication side effects- had  been started on Cymbalta but did not tolerate it well, so asked to D/C it . Side effects discussed, including risk of rash on Lamictal.    Cognitive Features That Contribute To Risk:   No gross cognitive deficits noted upon discharge. Is alert , attentive, and oriented x 3   Suicide Risk:  Mild:  Suicidal ideation of limited frequency, intensity, duration, and specificity.  There are no identifiable plans, no associated intent, mild dysphoria and related symptoms, good self-control (both objective and subjective assessment), few other risk factors, and identifiable protective factors, including available and accessible social support.  Follow-up Information    [facility] Follow up on 12/09/2016.   Specialty:  Behavioral Health Why:  Appt for therapy with Arrie Eastern on Tuesday at 11am. Your next appt with [name] for medication management is Wednesday, 12/10/16 at 11am. Thank you.   Contact information: [address] [place] [phone]          Plan Of Care/Follow-up recommendations:  Activity:  as tolerated  Diet:  regular Tests:  NA Other:  see below  Patient is expressing readiness for discharge and is leaving unit in good spirits  She plans to return home Follow up as above . She has a PCP, Dr. Buelah Manis in [facility] for medical issues as needed- we reviewed importance of following up regarding mildly elevated TSH.   Jenne Campus, MD 12/09/2016, 8:36 AM

## 2016-12-09 NOTE — Progress Notes (Signed)
D:  [name] self inventory sheet, patient sleeps good, sleep medication helpful.  Good appetite, normal energy level, better concentration.  Rated depression 1, denied hopeless, anxiety 4.  Denied withdrawals.  Denied SI.  Physical problems, grinding teeth.  Physical pain, #8 worst in past 24 hours, pelvic area, motrin helpful.  Goal is MD, appt in [place].  Plans to be positive.  Can see clearly, better daily.  Does have discharge plans.  Thank you for everything. A:  Medications administered per MD orders.  Emotional support and encouragement given patient. R:  Denied SI and HI, contracts for safety.  Denied A/V hallucinations. Safety maintained with 15 minute checks.

## 2016-12-09 NOTE — Progress Notes (Signed)
   THERAPIST PROGRESS NOTE  Session Time: 11:00 am -11:45 am  Participation Level: Active  Behavioral Response: CasualAlertEuthymic  Type of Therapy: Individual Therapy  Treatment Goals addressed: Coping  Interventions: CBT and Solution Focused  Summary: Kathleen Palmer is a 56 y.o. female who presents oriented x4 (person, place, situation, and object), distracted, casually groomed, causually dressed and cooperative to address depression. Patient has a history of medical issues and mental health treatment. She denies symptoms of mania. Patient denies suicidal and homicidal ideations. She denies psychosis including auditory and visual hallucinations. Patient admits to cannabis use. She is at low risk for lethality. Patient has been hospitalized 7 times due to suicidal ideations but states she has never attempted due to her religious faith.   Patient had an average score of 5 on the Outcome Rating Scale. Patient reported that she went to the hospital after taking several opiate pills she had been prescribed. 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 Patient got on non narcotic medications and a mood stabilizer. Patient made the decision that she wanted to live and that she needed to quit "inhaling" her sisters sickness and letting it consume her then using her medication to cope with her sister's sickness. Patient explained that since she decided to stop using opiates her husband committed to stop drinking as well. Patient explained that she is focusing on baby steps. She is going to start going outside in the morning and sitting as a way to work on getting back into gardening. Patient committed to take small steps to start living again (not staying in bed, going outside, being more active) and continue to be honest about her mood as well as feelings. Patient denies current  thoughts of suicide.  Patient rated the session 10 out of 10 on the Session Rating Scale.   Patient engaged in session. She responded well to interventions. Patient continues to meet criteria for Major Depressive disorder, recurrent episode, severe with anxious distress. Patient will continue in outpatient therapy due to being the least restrictive service to meet her needs. Patient made minimum progress on her goals at this time.   Suicidal/Homicidal: Negativewithout intent/plan  Therapist Response: Therapist reviewed patient's recent thoughts and behaviors. Therapist utilized CBT to address depression. Therapist processed patient's feelings to identify triggers for mood that got her hospitalized.  Therapist discussed accountability for actions including taking medication and giving input into her treatment. Therapist had patient explain what she means by "living" and identify steps to take to start living. Therapist committed patient to take small steps to start living again and be honest about her thoughts and feelings. Therapist assessed for safety. Therapist administered the Outcome Rating Scale and the Session Rating Scale.  Plan: Return again in 2 weeks.      Therapist will review patient goals on or before 09.27.2018  Diagnosis: Axis I: Major depressive disorder, recurrent episode, severe with anxious distress    Axis II: No diagnosis    [name], LCSW 12/09/2016

## 2016-12-09 NOTE — Progress Notes (Signed)
Discharge Note:  Patient discharged with husband.  Patient denied SI and HI.  Denied A/V hallucinations.  Suicide prevention given and discussed with patient who stated he understood and had no questions.  Patient stated she received all her belongings, clothing, toiletries, misc items, prescriptions, etc.  Patient stated she appreciated all assistance received from [facility] staff.  All required discharge information given to patient at discharge.

## 2016-12-09 NOTE — Discharge Summary (Signed)
Physician Discharge Summary Note  Patient:  Kathleen Palmer is an 56 y.o., female MRN:  [id number] DOB:  [birth year] Patient phone:  [phone] (home)  Patient address:   [address] [place] [address],  Total Time spent with patient: 30 minutes  Date of Admission:  12/03/2016 Date of Discharge: 12/09/16   Reason for Admission:  Worsening depression and SI  Principal Problem: MDD (major depressive disorder), recurrent severe, without psychosis [facility]) Discharge Diagnoses: Patient Active Problem List   Diagnosis Date Noted  . Substance induced mood disorder ([place]) [F19.94] 12/04/2016  . MDD (major depressive disorder), recurrent severe, without psychosis ([place]) [F33.2] 12/04/2016  . Family hx of ALS (amyotrophic lateral sclerosis) [Z82.0] 08/31/2015  . Chronic hepatitis C ([place]) [B18.2] 01/24/2014  . Osteopenia [M85.80] 12/02/2013  . OA (osteoarthritis) of knee [M17.10] 09/20/2013  . Clitoral irritation [N90.89] 08/01/2013  . Epidermoid cyst of skin [L72.0] 08/01/2013  . Atypical chest pain [R07.89] 03/25/2013  . Colon cancer screening [Z12.11] 03/25/2013  . Knee pain [M25.569] 03/25/2013  . Marijuana use [F12.90] 07/20/2012  . Unspecified vitamin D deficiency [E55.9] 06/02/2012  . Pain [R52] 04/07/2012  . Elevated blood pressure (not hypertension) [R03.0] 04/06/2012  . 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 Subclinical hypothyroidism [E03.9] 02/06/2012  . Chronic pain syndrome [G89.4] 02/06/2012  . H/O vaginal surgery [Z98.890] 02/06/2012  . 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 Ileostomy status ([place]) [Z93.2] 02/06/2012  . 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 PTSD (post-traumatic stress disorder) [F43.10] 02/06/2012  . MDD (major depressive disorder) ([place]) [F32.9] 02/06/2012    Past Psychiatric History: MDD, Anxiety. Multiple ED visits for depression and panic disorder. Currently sees [name] for outpatient psychiatric care. Endorses being on  Xanax and Hydrocodone for the past 20 years.   Past Medical History:  Past Medical History:  Diagnosis Date  . Angiomyolipoma    Left kidney  . Anxiety   . Arthritis   . Attention to urostomy [facility])   . Chronic pain   . Depression   . Hepatitis C    ? Contracted through IVDA  . Panic 04/29/1999  . PTSD (post-traumatic stress disorder)   . Substance abuse    Remote history- cocaine, ETOH, Marijuana  . Thyroid disease     Past Surgical History:  Procedure Laterality Date  . ABDOMINAL HYSTERECTOMY    . ABDOMINAL SURGERY    . CHOLECYSTECTOMY    . HERNIA REPAIR    . ILEO LOOP CONDUIT    . 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 VAGINA RECONSTRUCTION SURGERY     Family History:  Family History  Problem Relation Age of Onset  . Adopted: Yes  . Depression Mother   . Depression Sister   . Anxiety disorder Sister   . Bipolar disorder Sister   . Depression Maternal Grandmother   . ADD / ADHD Neg Hx   . Alcohol abuse Neg Hx   . Drug abuse Neg Hx   . Dementia Neg Hx   . OCD Neg Hx   . Paranoid behavior Neg Hx   . Schizophrenia Neg Hx   . Seizures Neg Hx   . Sexual abuse Neg Hx   . Physical abuse Neg Hx   . Suicidality Neg Hx   . Colon cancer Neg Hx    Family Psychiatric  History: Depression. Grandmother dies in a mental  institution. Mother dies from suicide.  Social History:  History  Alcohol Use No    Comment: occasionally     History  Drug Use  . Types: Marijuana    Comment: cocaine, back in the 1980s    Social History   Social History  . Marital status: Married    Spouse name: N/A  . Number of children: 0  . Years of education: N/A   Occupational History  . disability    Social History Main Topics  . Smoking status: Former Smoker    Packs/day: 0.25    Years: 33.00    Types: Cigarettes    Quit date: 01/21/2015  . Smokeless tobacco:  Never Used     Comment: 9-10 cigs a day as of 10/20/2012, (02-07-15 per pt, she stopped smoking 01-21-15)  . Alcohol use No     Comment: occasionally  . Drug use: Yes    Types: Marijuana     Comment: cocaine, back in the 1980s  . Sexual activity: Yes    Birth control/ protection: Surgical   Other Topics Concern  . None   Social History Narrative  . None    Hospital Course:  Per admission note:56year old female who presents voluntaryin no acute distress for the treatment of SI following an intentional overdose of "pain pills and Xanax". Patientappears flat, depressed, and tearful. Patientwas cooperative with admission process. Patientpresents with passive SI and contracts for safety upon admission. Patientdenies AVH. Patient identifies main stressor as "my sister's dying". Patient reports that her sister is battling ALS. 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 Additionally, patient has complaints of being "tired" and "weak". Patient currently lives at home with her husband and identifies her husband as her support system. Patient reports family hx of suicide stating her mother committed suicide and a long family hx of mental illness. While at [facility], patient would like to work on "getting my meds straight" and "talk to the psychologist". Skin was assessed and found to be clear of any abnormal marks apart from an ostomy bag.Patientsearched and no contraband found, POC and unit policies explained and understanding verbalized. Consents obtained. Patienthad no additional questions or concerns.  Patient states that since she started the antidepressant she has had increased anxiety, racing thoughts, and difficulty focusing and concentrating. She states that she will try a mood stabilizer, but she has been on Abilify, Zoloft, Effexor, with all causing the same results Patient started showing improvement and was  denying SI/HI/AVH., but continued to feel hopeless. She reported taht the medication had began working and the Vistaril added to improving her anxiety as well as being removed from the antidepressant. The lamictal stabilized her also. Patient reports at discharge that after 5 days on the unit and medication management this is the best she has felt and appreciates everything that has been done for her.  Patients's medications were managed and is being discharged on Xanax 0.5 mg PO QID, Hydroxyzine, 25 mg PO Q6H PRN for anxiety, Trazodone 100 mg PO QHS PRN for insomnia, and Lamictal 25 mg PO BID for mood stability.  Physical Findings: AIMS:  , ,  ,  ,    CIWA:    COWS:     Musculoskeletal: Strength & Muscle Tone: within normal limits Gait & Station: normal Patient leans: N/A  Psychiatric Specialty Exam: Physical Exam  Nursing note and vitals reviewed. Constitutional: She is oriented to person, place,  and time. She appears well-developed and well-nourished.  Cardiovascular: Normal rate.   Respiratory: Effort normal.  Musculoskeletal: Normal range of motion.  Neurological: She is alert and oriented to person, place, and time.  Skin: Skin is warm.    Review of Systems  Constitutional: Negative.   HENT: Negative.   Eyes: Negative.   Respiratory: Negative.   Cardiovascular: Negative.   Gastrointestinal: Negative.   Genitourinary: Negative.   Musculoskeletal: Negative.   Skin: Negative.   Neurological: Negative.   Endo/Heme/Allergies: Negative.     Blood pressure (!) 123/97, pulse 88, temperature 97.7 F (36.5 C), resp. rate 18, height 5\' 3"  (1.6 m), weight 73 kg (161 lb).Body mass index is 28.52 kg/m.  General Appearance: Casual and Fairly Groomed  Eye Contact:  Good  Speech:  Clear and Coherent and Normal Rate  Volume:  Normal  Mood:  Euthymic  Affect:  Appropriate  Thought Process:  Coherent and Descriptions of Associations: Intact  Orientation:  Full (Time, Place, and  Person)  Thought Content:  WDL  Suicidal Thoughts:  No  Homicidal Thoughts:  No  Memory:  Immediate;   Good Recent;   Good  Judgement:  Good  Insight:  Good  Psychomotor Activity:  Normal  Concentration:  Concentration: Good  Recall:  Good  Fund of Knowledge:  Good  Language:  Good  Akathisia:  No  Handed:  Right  AIMS (if indicated):     Assets:  Desire for Improvement Financial Resources/Insurance Social Support Transportation  ADL's:  Intact  Cognition:  WNL  Sleep:  Number of Hours: 5     Have you used any form of tobacco in the last 30 days? (Cigarettes, Smokeless Tobacco, Cigars, and/or Pipes): No  Has this patient used any form of tobacco in the last 30 days? (Cigarettes, Smokeless Tobacco, Cigars, and/or Pipes) Yes, No  Blood Alcohol level:  Lab Results  Component Value Date   ETH <5 12/02/2016   ETH <5 22/48/2500    Metabolic Disorder Labs:  Lab Results  Component Value Date   HGBA1C 5.2 12/05/2016   MPG 102.54 12/05/2016   MPG 117 (H) 10/23/2014   No results found for: PROLACTIN Lab Results  Component Value Date   CHOL 129 12/05/2016   TRIG 69 12/05/2016   HDL 45 12/05/2016   CHOLHDL 2.9 12/05/2016   VLDL 14 12/05/2016   LDLCALC 70 12/05/2016   LDLCALC 107 (H) 10/23/2014    See Psychiatric Specialty Exam and Suicide Risk Assessment completed by Attending Physician prior to discharge.  Discharge destination:  Home  Is patient on multiple antipsychotic therapies at discharge:  No   Has Patient had three or more failed trials of antipsychotic monotherapy by history:  No  Recommended Plan for Multiple Antipsychotic Therapies: NA   Allergies as of 12/09/2016      Reactions   Oxycodone Anxiety   Abilify [aripiprazole]    Stiff neck   Ampicillin    rash   Bactrim [sulfamethoxazole-trimethoprim]    Tongue swelled   Gabapentin    Dizziness to the point she actually fell      Medication List    STOP taking these medications   DULoxetine  20 MG capsule Commonly known as:  CYMBALTA     TAKE these medications     Indication  ALPRAZolam 0.5 MG tablet Commonly known as:  XANAX Take 1 tablet (0.5 mg total) by mouth 4 (four) times daily.  Indication:  Panic Disorder   hydrOXYzine 25 MG tablet  Commonly known as:  ATARAX/VISTARIL Take 1 tablet (25 mg total) by mouth every 6 (six) hours as needed for anxiety.  Indication:  Feeling Anxious   lamoTRIgine 25 MG tablet Commonly known as:  LAMICTAL Take 1 tablet (25 mg total) by mouth 2 (two) times daily.  Indication:  Manic-Depression   traZODone 100 MG tablet Commonly known as:  DESYREL Take 1 tablet (100 mg total) by mouth at bedtime as needed for sleep. What changed:  how much to take  when to take this  reasons to take this  Indication:  [facility] Follow up on 12/09/2016.   Specialty:  Behavioral Health Why:  Appt for therapy with [facility] on Tuesday at 11am. Your next appt with [name] for medication management is Wednesday, 12/10/16 at 11am. Thank you.   Contact information: [address] [place] [phone]          Follow-up recommendations:  Continue activity as tolerated. Continue diet as recommended by your PCP. Ensure to keep all appointments with outpatient providers.  Comments:  Patient is instructed prior to discharge to: Take all medications as prescribed by his/her mental healthcare provider. Report any adverse effects and or reactions from the medicines to his/her outpatient provider promptly. Patient has been instructed & cautioned: To not engage in alcohol and or illegal drug use while on prescription medicines. In the event of worsening symptoms, patient is instructed to call the crisis hotline, 911 and or go to the nearest ED for appropriate evaluation and treatment of symptoms. To follow-up with his/her  primary care provider for your other medical issues, concerns and or health care needs.    Signed: [place], FNP 12/09/2016, 7:55 AM

## 2016-12-09 NOTE — Progress Notes (Signed)
D: Patient denies SI/HI or AVH.  She has a depressed mood with anxious affect.  Pt. Spoke of her upcoming discharge and wanting to make sure everything would be ready for her 9 am departure.  Pt. Attended and participated in evening wrap up group.  She did request medication for her anxiety stating she was anxious but excited about going home.    A: Patient given emotional support from RN. Patient encouraged to come to staff with concerns and/or questions. [name] medication routine continued. [name] orders and plan of care reviewed.   R: Patient remains appropriate and cooperative. Will continue to monitor patient q15 minutes for safety.

## 2016-12-10 ENCOUNTER — Encounter: Payer: Self-pay | Admitting: Family Medicine

## 2016-12-10 ENCOUNTER — Ambulatory Visit (INDEPENDENT_AMBULATORY_CARE_PROVIDER_SITE_OTHER): Payer: Medicare Other | Admitting: Family Medicine

## 2016-12-10 ENCOUNTER — Ambulatory Visit (HOSPITAL_COMMUNITY): Payer: Medicare Other | Admitting: Psychiatry

## 2016-12-10 VITALS — BP 118/64 | HR 86 | Temp 98.1°F | Resp 16 | Ht 63.0 in | Wt 156.0 lb

## 2016-12-10 DIAGNOSIS — B182 Chronic viral hepatitis C: Secondary | ICD-10-CM

## 2016-12-10 DIAGNOSIS — F332 Major depressive disorder, recurrent severe without psychotic features: Secondary | ICD-10-CM

## 2016-12-10 DIAGNOSIS — G894 Chronic pain syndrome: Secondary | ICD-10-CM | POA: Diagnosis not present

## 2016-12-10 DIAGNOSIS — E039 Hypothyroidism, unspecified: Secondary | ICD-10-CM

## 2016-12-10 DIAGNOSIS — R234 Changes in skin texture: Secondary | ICD-10-CM | POA: Diagnosis not present

## 2016-12-10 DIAGNOSIS — E038 Other specified hypothyroidism: Secondary | ICD-10-CM

## 2016-12-10 MED ORDER — IBUPROFEN 600 MG PO TABS: 600.0000 mg | ORAL_TABLET | Freq: Two times a day (BID) | ORAL | 2 refills | Status: DC

## 2016-12-10 NOTE — Patient Instructions (Addendum)
Vitamin D 1000IU once a day  Ibuprofen with food for pain  F/U as previous

## 2016-12-10 NOTE — Progress Notes (Signed)
Subjective:    Patient ID: Kathleen Palmer, female    DOB: 12-14-1960, 56 y.o.   MRN: [id number]  Patient presents for Hospital F/U   Pt here for Hospital follow up. She is admitted to the hospital secondary to progressive worsening depression and suicidal attempt. Hospital admission notes that she tried to overdose herself with her narcotic medication and her benzodiazepines. She states that she took 5 or 10 of each of the pill attempt to kill herself but then had a change of mind and came to the hospital. During her emergency room workup her urine drug screen was only positive for benzodiazepines and marijuana there were no opiates She was taken off of some of her antidepressants if she continues to have racing thoughts increasing anxiety she was placed on Lamictal and continued on trazodone she is also on hydroxyzine as needed for anxiety and continued on Xanax 0.5 mg 4 times a day as needed  I reviewed her hospital records including her labs. She did have elevated TSH at 9.5 she's had subungual hypothyroidism for quite some time she has declined treatment for this she was on Synthroid in the past.  Occ chest discomfort- EKG normal, labs normal, cholesterol normal    States that she feels much better, came out of hospital yesterday. Husband also present. She wants to live and is going to follow through, now she is breaking her addiction to pain medications. Asked for non narcotic pain med. She has noticed some peeling on her skin, was on arms that is cleared with moisturizer, on knees and ankle, it is not spreadind, no pain, no redness, was happening in hospital she believes  Review Of Systems:  GEN- denies fatigue, fever, weight loss,weakness, recent illness HEENT- denies eye drainage, change in vision, nasal discharge, CVS- denies chest pain, palpitations RESP- denies SOB, cough, wheeze ABD- denies N/V, change in stools, abd pain GU- denies dysuria, hematuria, dribbling,  incontinence MSK- + joint pain, muscle aches, injury Neuro- denies headache, dizziness, syncope, seizure activity       Objective:    BP 118/64   Pulse 86   Temp 98.1 F (36.7 C) (Oral)   Resp 16   Ht 5\' 3"  (1.6 m)   Wt 156 lb (70.8 kg)   SpO2 98%   BMI 27.63 kg/m  GEN- NAD, alert and oriented x3 HEENT- PERRL, EOMI, non injected sclera, pink conjunctiva, MMM, oropharynx clear Neck- Supple, no thyromegaly CVS- RRR, no murmur RESP-CTAB Psych- happy, smiling, not depressed appearing, no SI, well groomed, normal speech  Skin- mild peeling no erythema on bilat knees, and left ankle, NT, no blisters, no vesicles  EXT- No edema Pulses- Radial  2+        Assessment & Plan:      Problem List Items Addressed This Visit    Subclinical hypothyroidism    She has not had any true thyroid symptoms in years I think TSH elevated at hospital in setting of her acute illness/ overdose Will recheck at our visit in Sept and decide if therapy is needed at that time      MDD (major depressive disorder), recurrent severe, without psychosis ([place]) - Primary    She seems happy today, hopefully she will sustatin this She has mild peeling on her knees and left ankle, more dry skin like, ? Timing with lamictal, though had on arms and that has cleared up I am not going to stop the meds and we will just monitor as it  does not look like stevens johnson at this time, no pain and per above arms cleared up already      Chronic pain syndrome    Per above, change to ibuprofen After intentional overdose      Chronic hepatitis C ([place])    She has appt with GI in a few weeks  Recent LFT looked okay Avoiding hepatic toxic medications  For pain will use ibuprofen 600mg  BID with food, used in hospital as well       Other Visit Diagnoses    Peeling skin          Note: This dictation was prepared with Dragon dictation along with smaller phrase technology. Any transcriptional errors that result  from this process are unintentional.

## 2016-12-11 ENCOUNTER — Encounter: Payer: Self-pay | Admitting: Family Medicine

## 2016-12-11 ENCOUNTER — Encounter (HOSPITAL_COMMUNITY): Payer: Self-pay | Admitting: Psychiatry

## 2016-12-11 ENCOUNTER — Ambulatory Visit (INDEPENDENT_AMBULATORY_CARE_PROVIDER_SITE_OTHER): Payer: Medicare Other | Admitting: Psychiatry

## 2016-12-11 VITALS — BP 128/94 | HR 103 | Ht 63.0 in | Wt 155.0 lb

## 2016-12-11 DIAGNOSIS — F1210 Cannabis abuse, uncomplicated: Secondary | ICD-10-CM

## 2016-12-11 DIAGNOSIS — Z79899 Other long term (current) drug therapy: Secondary | ICD-10-CM

## 2016-12-11 DIAGNOSIS — F332 Major depressive disorder, recurrent severe without psychotic features: Secondary | ICD-10-CM | POA: Diagnosis not present

## 2016-12-11 DIAGNOSIS — F419 Anxiety disorder, unspecified: Secondary | ICD-10-CM

## 2016-12-11 DIAGNOSIS — F4310 Post-traumatic stress disorder, unspecified: Secondary | ICD-10-CM | POA: Diagnosis not present

## 2016-12-11 MED ORDER — HYDROXYZINE HCL 25 MG PO TABS: 25.0000 mg | ORAL_TABLET | Freq: Four times a day (QID) | ORAL | 2 refills | Status: DC | PRN

## 2016-12-11 MED ORDER — LAMOTRIGINE 25 MG PO TABS: 25.0000 mg | ORAL_TABLET | Freq: Two times a day (BID) | ORAL | 2 refills | Status: DC

## 2016-12-11 MED ORDER — HYDROXYZINE HCL 25 MG PO TABS
25.0000 mg | ORAL_TABLET | Freq: Four times a day (QID) | ORAL | 2 refills | Status: DC | PRN
Start: 2016-12-11 — End: 2016-12-11
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
## 2016-12-11 NOTE — Assessment & Plan Note (Signed)
She has appt with GI in a few weeks  Recent LFT looked okay Avoiding hepatic toxic medications  For pain will use ibuprofen 600mg  BID with food, used in hospital as well

## 2016-12-11 NOTE — Assessment & Plan Note (Signed)
Per above, change to ibuprofen After intentional overdose

## 2016-12-11 NOTE — Assessment & Plan Note (Signed)
She seems happy today, hopefully she will sustatin this She has mild peeling on her knees and left ankle, more dry skin like, ? Timing with lamictal, though had on arms and that has cleared up I am not going to stop the meds and we will just monitor as it does not look like stevens johnson at this time, no pain and per above arms cleared up already

## 2016-12-11 NOTE — Assessment & Plan Note (Signed)
She has not had any true thyroid symptoms in years I think TSH elevated at hospital in setting of her acute illness/ overdose Will recheck at our visit in Sept and decide if therapy is needed at that time

## 2016-12-17 ENCOUNTER — Ambulatory Visit (HOSPITAL_COMMUNITY)
Admission: RE | Admit: 2016-12-17 | Discharge: 2016-12-17 | Disposition: A | Discharge status: Home / Self Care | Payer: Medicare Other | Source: Ambulatory Visit | Attending: Family Medicine | Admitting: Family Medicine

## 2016-12-17 DIAGNOSIS — Z1231 Encounter for screening mammogram for malignant neoplasm of breast: Secondary | ICD-10-CM | POA: Diagnosis not present

## 2016-12-18 ENCOUNTER — Telehealth (HOSPITAL_COMMUNITY): Payer: Self-pay | Admitting: *Deleted

## 2016-12-18 NOTE — Telephone Encounter (Signed)
I tried calling but no answer, no way to leave message. If you can get up with her tell her to stop lamictal,If rahs is severe she needs to go to ED. Try to get her in sooner

## 2016-12-18 NOTE — Telephone Encounter (Signed)
Pt called stating he's having side effect with her Lamictal. Per pt she is having peeling skin, racing mind, irritable skin grinding teeth and tingling feel and can not keep focus on anything. Pt number [phone].

## 2016-12-19 NOTE — Telephone Encounter (Signed)
Called pt and spoke with pt and informed her with what provider stated and pt verbalized understanding. Sooner appt was schedule with pt and she verbalized understanding.

## 2016-12-23 ENCOUNTER — Encounter (HOSPITAL_COMMUNITY): Payer: Self-pay | Admitting: Psychiatry

## 2016-12-23 ENCOUNTER — Ambulatory Visit (INDEPENDENT_AMBULATORY_CARE_PROVIDER_SITE_OTHER): Payer: Medicare Other | Admitting: Psychiatry

## 2016-12-23 VITALS — BP 124/87 | HR 93 | Ht 63.0 in | Wt 156.6 lb

## 2016-12-23 DIAGNOSIS — Z818 Family history of other mental and behavioral disorders: Secondary | ICD-10-CM

## 2016-12-23 DIAGNOSIS — Z62810 Personal history of physical and sexual abuse in childhood: Secondary | ICD-10-CM

## 2016-12-23 DIAGNOSIS — F39 Unspecified mood [affective] disorder: Secondary | ICD-10-CM

## 2016-12-23 DIAGNOSIS — Z736 Limitation of activities due to disability: Secondary | ICD-10-CM

## 2016-12-23 DIAGNOSIS — F332 Major depressive disorder, recurrent severe without psychotic features: Secondary | ICD-10-CM

## 2016-12-23 DIAGNOSIS — F4310 Post-traumatic stress disorder, unspecified: Secondary | ICD-10-CM | POA: Diagnosis not present
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
## 2016-12-26 ENCOUNTER — Ambulatory Visit (INDEPENDENT_AMBULATORY_CARE_PROVIDER_SITE_OTHER): Payer: Medicare Other | Admitting: Licensed Clinical Social Worker

## 2016-12-26 DIAGNOSIS — F332 Major depressive disorder, recurrent severe without psychotic features: Secondary | ICD-10-CM | POA: Diagnosis not present
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
## 2016-12-30 ENCOUNTER — Other Ambulatory Visit: Payer: Self-pay

## 2016-12-30 ENCOUNTER — Ambulatory Visit (INDEPENDENT_AMBULATORY_CARE_PROVIDER_SITE_OTHER): Payer: Medicare Other | Admitting: Nurse Practitioner

## 2016-12-30 ENCOUNTER — Telehealth: Payer: Self-pay

## 2016-12-30 ENCOUNTER — Encounter: Payer: Self-pay | Admitting: Nurse Practitioner

## 2016-12-30 VITALS — BP 116/76 | HR 96 | Temp 99.1°F | Ht 63.0 in | Wt 154.4 lb

## 2016-12-30 DIAGNOSIS — B1920 Unspecified viral hepatitis C without hepatic coma: Secondary | ICD-10-CM

## 2016-12-30 DIAGNOSIS — B182 Chronic viral hepatitis C: Secondary | ICD-10-CM | POA: Diagnosis not present

## 2016-12-30 DIAGNOSIS — K5900 Constipation, unspecified: Secondary | ICD-10-CM | POA: Diagnosis not present

## 2016-12-30 DIAGNOSIS — K7400 Hepatic fibrosis, unspecified: Secondary | ICD-10-CM

## 2016-12-30 DIAGNOSIS — Z1211 Encounter for screening for malignant neoplasm of colon: Secondary | ICD-10-CM

## 2016-12-30 MED ORDER — NA SULFATE-K SULFATE-MG SULF 17.5-3.13-1.6 GM/177ML PO SOLN: 1.0000 | ORAL | 0 refills | Status: DC

## 2016-12-30 NOTE — Progress Notes (Signed)
Referring Provider: Alycia Rossetti, MD Primary Care Physician:  Alycia Rossetti, MD Primary GI:  Dr. Oneida Alar  Chief Complaint  Patient presents with  . Hepatitis C    HPI:   Kathleen Palmer is a 56 y.o. female who presents For evaluation of hepatitis C treatment. Patient was last seen in our office 01/24/2014 for the same. At that time it was noted she was diagnosed in 1996, treatment nave. History of anxiety and depression managed by psychiatry. Recent ultrasound at that time noted hepatomegaly with fatty liver, recurrent LFTs normal. Hepatitis C RNA from August 2015 was [id number]. Genotype 1a. At that time she complained of severe fatigue, no abdominal pain, appetite okay, no other hepatic or GI symptoms. No prior colonoscopy or EGD and refused colonoscopy given history of multiple pelvic surgeries/compensation/ileal loop conduit. History of remote IV drug use/cocaine, but none since the 1980s. Multiple drug screens negative for cocaine and no alcohol since 2004. At that time she was undergoing evaluation of breast masses well felt to be benign but ultrasound-guided biopsy was planned.  In 2016 it was noted that she has hepatitis C and significant fibrosis on ultrasound but never completed to processes start treatment, noted a lot of issues with depression per Epic. It was recommended she be referred to see at bedtime liver clinic in [place] to discuss treatment options. Recommended routine follow-up for liver disease (F3 to 4) in 6 months.  The patient was recently admitted for suicidal ideations. Labs were completed at that time which showed urine drug screen positive for benzodiazepines and THC, CBC with low platelet count at 114, CMP with elevated AST at 43 but normal ALT, alkaline phosphatase, bilirubin. Tylenol and salicylate levels were negative. Alcohol level negative. MELD: 9, Child-Pugh: A. hepatitis C RNA completed 10/17/2016 and positive at 489,000 copies (log of 5.69). CT of  the abdomen and pelvis found no focal liver abnormality, cholecystectomy with chronic intrahepatic bile duct prominence, mild surface lobulation of the liver, no splenomegaly, mildly enlarged floats in the deep liver drainage up to 1 cm noted is usually reactive and incidental in isolation recommended cirrhosis workup.  Today she states she's come off all opiates now. Having constipation. Having chronic abdominal pain related to several abdominal surgeries. Thinks she's developed hernias in that area. Had one episode of vomiting yesterday, otherwise no persistent N/V. Denies hematochezia (though has happened in the past), melena. Stools are constipated and thin. Denies yellowing of skin/eyes, darkened urine. Does have some episodic confusion and feels this is new (but has significant mental health issues). Has occasional tremors/shakes but not persistent. Denies chest pain, dyspnea, dizziness, lightheadedness, syncope, near syncope. Denies any other upper or lower GI symptoms.  States she uses THC due to chronic pain and she's "from [place] where it's legal."  Past Medical History:  Diagnosis Date  . Angiomyolipoma    Left kidney  . Anxiety   . Arthritis   . Attention to urostomy [facility])   . Chronic pain   . Depression   . Hepatitis C    ? Contracted through IVDA  . Panic 04/29/1999  . PTSD (post-traumatic stress disorder)   . Substance abuse    Remote history- cocaine, ETOH, Marijuana  . Thyroid disease     Past Surgical History:  Procedure Laterality Date  . ABDOMINAL HYSTERECTOMY    . ABDOMINAL SURGERY    . CHOLECYSTECTOMY    . HERNIA REPAIR    . ILEO LOOP CONDUIT    .  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 VAGINA RECONSTRUCTION SURGERY      Current Outpatient Prescriptions  Medication Sig Dispense Refill  . ALPRAZolam (XANAX) 0.5 MG tablet Take 1 tablet (0.5  mg total) by mouth 4 (four) times daily. 120 tablet 0  . hydrOXYzine (ATARAX/VISTARIL) 25 MG tablet Take 1 tablet (25 mg total) by mouth every 6 (six) hours as needed for anxiety. 90 tablet 2  . ibuprofen (ADVIL,MOTRIN) 600 MG tablet Take 600 mg by mouth every 6 (six) hours as needed.    . traZODone (DESYREL) 100 MG tablet Take 1 tablet (100 mg total) by mouth at bedtime as needed for sleep. (Patient taking differently: Take 200 mg by mouth at bedtime as needed for sleep. ) 30 tablet 0   No current facility-administered medications for this visit.     Allergies as of 12/30/2016 - Review Complete 12/30/2016  Allergen Reaction Noted  . Oxycodone Anxiety 03/05/2012  . Abilify [aripiprazole]  06/08/2013  . Ampicillin  12/01/2011  . Bactrim [sulfamethoxazole-trimethoprim]  12/01/2011  . Gabapentin  12/01/2011    Family History  Problem Relation Age of Onset  . Adopted: Yes  . Depression Mother   . Depression Sister   . Anxiety disorder Sister   . Bipolar disorder Sister   . Depression Maternal Grandmother   . ADD / ADHD Neg Hx   . Alcohol abuse Neg Hx   . Drug abuse Neg Hx   . Dementia Neg Hx   . OCD Neg Hx   . Paranoid behavior Neg Hx   . Schizophrenia Neg Hx   . Seizures Neg Hx   . Sexual abuse Neg Hx   . Physical abuse Neg Hx   . Suicidality Neg Hx   . Colon cancer Neg Hx     Social History   Social History  . Marital status: Married    Spouse name: N/A  . Number of children: 0  . Years of education: N/A   Occupational History  . disability    Social History Main Topics  . Smoking status: Former Smoker    Packs/day: 0.25    Years: 33.00    Types: Cigarettes    Quit date: 01/21/2015  . Smokeless tobacco: Never Used     Comment: 9-10 cigs a day as of 10/20/2012, (02-07-15 per pt, she stopped smoking 01-21-15)  . Alcohol use No  . Drug use: Yes    Types: Marijuana     Comment: cocaine back in the 1980s; Marijuana rarely  . Sexual activity: Yes    Birth control/  protection: Surgical   Other Topics Concern  . None   Social History Narrative  . None    Review of Systems: Complete ROS negative except as per HPI.   Physical Exam: BP 116/76   Pulse 96   Temp 99.1 F (37.3 C) (Oral)   Ht 5\' 3"  (1.6 m)   Wt 154 lb 6.4 oz (70 kg)   BMI 27.35 kg/m  General:   Alert and oriented. Pleasant and cooperative. Well-nourished and well-developed.  Head:  Normocephalic and atraumatic. Eyes:  Without icterus, sclera clear and conjunctiva pink.  Ears:  Normal auditory acuity. Cardiovascular:  S1, S2 present without murmurs appreciated. Extremities without clubbing or edema. Respiratory:  Clear to auscultation bilaterally. No wheezes, rales, or rhonchi. No distress.  Gastrointestinal:  +BS, soft, and non-distended.  Mild generalized TTP. No HSM noted. No guarding or rebound. No masses appreciated.  Rectal:  Deferred  Musculoskalatal:  Symmetrical without gross deformities. Skin:  Intact without significant lesions or rashes. Neurologic:  Alert and oriented x4;  grossly normal neurologically. Psych:  Alert and cooperative. Somewhat blunted affect. Heme/Lymph/Immune: No excessive bruising noted.    12/30/2016 9:48 AM   Disclaimer: This note was dictated with voice recognition software. Similar sounding words can inadvertently be transcribed and may not be corrected upon review.

## 2016-12-30 NOTE — Progress Notes (Signed)
CC'ED TO PCP 

## 2016-12-30 NOTE — Patient Instructions (Addendum)
1. Have your labs done when you're able to. 2. I am giving you Linzess 72 g samples. Take 1 pill, once a day, on an empty stomach. 3. Call us in 1-2 weeks to let us know if the Linzess is helping her constipation. 4. We will schedule your colonoscopy and upper endoscopy for you. 5. We will refer you to the liver clinic in [facility] to see Arrie Aran, NP 6. Return for follow-up in 3 months.

## 2016-12-30 NOTE — Telephone Encounter (Signed)
Called and informed pt of pre-op appt 01/21/17 at 11:00am. Letter mailed.

## 2016-12-30 NOTE — Assessment & Plan Note (Signed)
Patient describes constipation which is new for her. States stools are small. She is never had a colonoscopy and is overdue. History of ileocecal conduit. CT imaging early August 2018 essentially normal bowel exam. At this point I will trial her on Linzess 72 g with samples to last 2 weeks and request a progress report in 1-2 weeks. We will also plan for colonoscopy as she is currently due. 10 for follow-up in 3 months related to constipation and colonoscopy.  Proceed with colonoscopy on propofol/MAC with Dr. Oneida Alar in the near future. The risks, benefits, and alternatives have been discussed in detail with the patient. They state understanding and desire to proceed.   The patient has significant mental health history. She is currently on Xanax and trazodone. No other anticoagulants, anxiolytics, chronic pain medications, or antidepressants. We will plan for the procedure on propofol/MAC to promote adequate sedation.

## 2016-12-30 NOTE — Assessment & Plan Note (Addendum)
The patient has chronic hepatitis C. She was first seen for this in 2015 at which point we recommended she be referred to the liver clinic in [place]. She did not follow up. She now presents on referral from primary care again for hepatitis C with HCV RNA Artie completed and found positive. Because her labs from about 56 years old I will repeat HCV genotype. I attempted to recheck ultrasound elastography to evaluate for scarring (given her last one was about 3 years prior) but despite multiple dx her insurance would not cover it. Her previous elastography demonstrated F3 to F4. We will again refer her Dawn at the liver clinic in [place] for consideration of treatment given her complex mental health issues.  Her most recent meld score was 9, child Pugh class A.  Given her depressed platelet count (indicitive of portal hypertension) and high-grade fibrosis (F3-F4) 3 years ago we will plan to add on upper endoscopy in addition to her colonoscopy to evaluate for esophageal varices.  Proceed with EGD on propol/MAC with Dr. Oneida Alar in near future: the risks, benefits, and alternatives have been discussed with the patient in detail. The patient states understanding and desires to proceed.  The patient has significant mental health history. She is currently on Xanax and trazodone. No other anticoagulants, anxiolytics, chronic pain medications, or antidepressants. We will plan for the procedure on propofol/MAC to promote adequate sedation.

## 2017-01-06 ENCOUNTER — Telehealth (HOSPITAL_COMMUNITY): Payer: Self-pay | Admitting: *Deleted

## 2017-01-06 ENCOUNTER — Other Ambulatory Visit (HOSPITAL_COMMUNITY): Payer: Self-pay | Admitting: Psychiatry

## 2017-01-06 MED ORDER — ALPRAZOLAM 0.5 MG PO TABS: 0.5000 mg | ORAL_TABLET | Freq: Four times a day (QID) | ORAL | 2 refills | Status: DC

## 2017-01-06 NOTE — Telephone Encounter (Signed)
printed

## 2017-01-06 NOTE — Telephone Encounter (Signed)
Pt came into office to pick up printed medication she request per previous message. Pt script is for her Xanax 0.5 mg QID. Per pt script her Order number is [id number] and script id number is [id number]. Per pt her D/L is in the car and staff informed pt to please bring DL the next time she comes in for a printed script. Pt showed understanding.

## 2017-01-06 NOTE — Telephone Encounter (Signed)
Pt came into office and picked up her printed script.

## 2017-01-06 NOTE — Telephone Encounter (Signed)
Pt walked into office stating she is out of her 0.5 mg Xanax. Per pt she was informed that she had to call when the time gets closer for her to get refills for her Xanax. Pt f/u appt is 01-20-2017. Per pt chart, pt medication was filled by Marvia Pickles at the hospital. Per pt she will come back to office before 4:30 pm today to pick up her Xanax script.

## 2017-01-08 ENCOUNTER — Ambulatory Visit (HOSPITAL_COMMUNITY): Payer: Self-pay | Admitting: Psychiatry

## 2017-01-12 ENCOUNTER — Telehealth: Payer: Self-pay

## 2017-01-12 NOTE — Telephone Encounter (Signed)
Received fax from [facility] after referral was sent 12/30/16 saying that they received referral info. Called [facility] today to see if pt has appt. LMOAM.

## 2017-01-12 NOTE — Telephone Encounter (Signed)
Kathleen Locks, NP called office. She said [name] tried to call pt for appt and LMOVM. She will have Chelsea try to call her again tomorrow. If they are unable to reach pt, they will send a letter.

## 2017-01-13 ENCOUNTER — Ambulatory Visit (INDEPENDENT_AMBULATORY_CARE_PROVIDER_SITE_OTHER): Payer: Medicare Other | Admitting: Licensed Clinical Social Worker

## 2017-01-13 DIAGNOSIS — F419 Anxiety disorder, unspecified: Secondary | ICD-10-CM | POA: Diagnosis not present

## 2017-01-13 DIAGNOSIS — F332 Major depressive disorder, recurrent severe without psychotic features: Secondary | ICD-10-CM
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
## 2017-01-14 ENCOUNTER — Telehealth: Payer: Self-pay | Admitting: Gastroenterology

## 2017-01-14 DIAGNOSIS — K5900 Constipation, unspecified: Secondary | ICD-10-CM | POA: Diagnosis not present

## 2017-01-14 MED ORDER — LINACLOTIDE 72 MCG PO CAPS: 72.0000 ug | ORAL_CAPSULE | Freq: Every day | ORAL | 3 refills | Status: DC

## 2017-01-14 NOTE — Telephone Encounter (Signed)
Pt needs prescription called into [facility] in [place]. She has been taking Linzess samples (doesn't know the strength) and is almost out.

## 2017-01-14 NOTE — Addendum Note (Signed)
Addended by: Annitta Needs on: 01/14/2017 03:04 PM   Modules accepted: Orders

## 2017-01-14 NOTE — Telephone Encounter (Signed)
Forwarding to refill box.  

## 2017-01-14 NOTE — Telephone Encounter (Signed)
Linzess 72 micrograms sent to pharmacy.

## 2017-01-19 ENCOUNTER — Encounter: Payer: Self-pay | Admitting: Family Medicine

## 2017-01-19 ENCOUNTER — Ambulatory Visit (INDEPENDENT_AMBULATORY_CARE_PROVIDER_SITE_OTHER): Payer: Medicare Other | Admitting: Family Medicine

## 2017-01-19 VITALS — BP 104/70 | HR 76 | Temp 98.0°F | Resp 18 | Wt 157.2 lb

## 2017-01-19 DIAGNOSIS — G5790 Unspecified mononeuropathy of unspecified lower limb: Secondary | ICD-10-CM | POA: Diagnosis not present

## 2017-01-19 DIAGNOSIS — R202 Paresthesia of skin: Secondary | ICD-10-CM | POA: Diagnosis not present

## 2017-01-19 DIAGNOSIS — Z79899 Other long term (current) drug therapy: Secondary | ICD-10-CM | POA: Diagnosis not present

## 2017-01-19 DIAGNOSIS — E038 Other specified hypothyroidism: Secondary | ICD-10-CM

## 2017-01-19 DIAGNOSIS — E039 Hypothyroidism, unspecified: Secondary | ICD-10-CM

## 2017-01-19 DIAGNOSIS — G5793 Unspecified mononeuropathy of bilateral lower limbs: Secondary | ICD-10-CM

## 2017-01-19 DIAGNOSIS — R200 Anesthesia of skin: Secondary | ICD-10-CM | POA: Diagnosis not present

## 2017-01-19 LAB — T3, FREE: T3, Free: 2.7 pg/mL (ref 2.3–4.2)

## 2017-01-19 LAB — TSH: TSH: 6.9 mIU/L — ABNORMAL HIGH (ref 0.40–4.50)

## 2017-01-19 NOTE — Assessment & Plan Note (Signed)
Recheck TFT 

## 2017-01-19 NOTE — Progress Notes (Signed)
Subjective:    Patient ID: Kathleen Palmer, female    DOB: [birth year], 56 y.o.   MRN: [id number]  Patient presents for Follow-up   Pt here for intermin follow up, previous visit was after discharge from behavioral health. She is seeing [name]- She is only taking trazodone, vistaril ( takes up to 3 a day)xanax QID Now off the Lamictal As appointment tomorrow with her psychiatrist to discuss her medications. She is still dealing with chronic pain associated with marijuana at least once a week  She followed through with GI for her Hepatitis C , scheduled for EGD and colonoscopy  Taking linzess for bowels   Taking ibuprofen for pain  She has noted increased tingling and numbness in both of her feet but the right is worse than the left. During all for pelvic surgery she was told that they hit a nerve to call some damage into her right leg she also has a bunion in that foot. Now she notices to tingling and numbness in her feet as well as her upper extremities over the past month that she has been off of all the pain medications and she feels more clearheaded. She also is not sure how long it has been covered up and she would like evaluated. With regards to her and they go numb all the time she does not have any significant neck pain associated occasional stiffness    Review Of Systems:  GEN- denies fatigue, fever, weight loss,weakness, recent illness HEENT- denies eye drainage, change in vision, nasal discharge, CVS- denies chest pain, palpitations RESP- denies SOB, cough, wheeze ABD- denies N/V, change in stools, abd pain GU- denies dysuria, hematuria, dribbling, incontinence MSK- +joint pain, muscle aches, injury Neuro- denies headache, dizziness, syncope, seizure activity       Objective:    BP 104/70 (BP Location: Right Arm, Patient Position: Sitting, Cuff Size: Normal)   Pulse 76   Temp 98 F (36.7 C) (Oral)   Resp 18   Wt 157 lb 3.2 oz (71.3 kg)   BMI 27.85 kg/m  GEN- NAD,  alert and oriented x3 HEENT- PERRL, EOMI, non injected sclera, pink conjunctiva, MMM, oropharynx clear Neck- Supple, fair ROM, neg spurlings  CVS- RRR, no murmur RESP-CTAB Psych- smiling, not depressed appearing, no SI  Neuro- decreased monofilament bilat R foot > L, decreased bilt hands, +phalens/tinels , normal tone LE, sensation grossly in tact, motor in tact upper/lower ext,decreaed ROM right foot compared to left  EXT- No edema,bunion right foot  Pulses- Radial, DP- 2+        Assessment & Plan:      Problem List Items Addressed This Visit      Unprioritized   Subclinical hypothyroidism    Recheck TFT      Relevant Orders   TSH   T3, free    Other Visit Diagnoses    Neuropathy involving both lower extremities    -  Primary   I think right foot is chronic due to surgery as a teen, as she is off the opiates now is feeling other pains ETC that she did mention for years. Upper ext could Be coming from her neck or possible carpal tunnel syndrome. It is difficult as she is tingling numbness across all the fingertips. And then obtain an x-ray of her C-spine to look at degenerative disc first. Also possible that she has a B-12 deficiency she's been on multiple medications for many years self medicates with marijuana nutrition status up  and down over the years. I will also send her to neurology to be evaluated    Relevant Orders   Vitamin B12   Numbness and tingling in both hands       Relevant Orders   DG Cervical Spine Complete      Note: This dictation was prepared with Dragon dictation along with smaller phrase technology. Any transcriptional errors that result from this process are unintentional.

## 2017-01-19 NOTE — Patient Instructions (Addendum)
Get xray of the neck  Continue all the other medications We will call with appointment for neurologist for your tingling F/U 4 months

## 2017-01-20 ENCOUNTER — Ambulatory Visit (INDEPENDENT_AMBULATORY_CARE_PROVIDER_SITE_OTHER): Payer: Medicare Other | Admitting: Psychiatry

## 2017-01-20 ENCOUNTER — Encounter (HOSPITAL_COMMUNITY): Payer: Self-pay | Admitting: Psychiatry

## 2017-01-20 VITALS — BP 124/76 | HR 75 | Ht 63.0 in | Wt 156.2 lb

## 2017-01-20 DIAGNOSIS — F419 Anxiety disorder, unspecified: Secondary | ICD-10-CM | POA: Diagnosis not present

## 2017-01-20 DIAGNOSIS — F332 Major depressive disorder, recurrent severe without psychotic features: Secondary | ICD-10-CM

## 2017-01-20 DIAGNOSIS — F1210 Cannabis abuse, uncomplicated: Secondary | ICD-10-CM | POA: Diagnosis not present

## 2017-01-20 DIAGNOSIS — F39 Unspecified mood [affective] disorder: Secondary | ICD-10-CM | POA: Diagnosis not present

## 2017-01-20 DIAGNOSIS — Z79899 Other long term (current) drug therapy: Secondary | ICD-10-CM

## 2017-01-20 DIAGNOSIS — F4310 Post-traumatic stress disorder, unspecified: Secondary | ICD-10-CM

## 2017-01-20 DIAGNOSIS — F603 Borderline personality disorder: Secondary | ICD-10-CM

## 2017-01-20 LAB — VITAMIN B12: VITAMIN B 12: 595 pg/mL (ref 200–1100)

## 2017-01-20 MED ORDER — HYDROXYZINE HCL 25 MG PO TABS: 25.0000 mg | ORAL_TABLET | Freq: Four times a day (QID) | ORAL | 2 refills | Status: DC | PRN

## 2017-01-20 NOTE — Patient Instructions (Signed)
Kathleen Palmer [facility]  01/20/2017     @[email or web address] @   Your procedure is scheduled on  01/27/2017   Report to [facility] at  46  A.M.  Call this number if you have problems the morning of surgery:  [phone]   Remember:  Do not eat food or drink liquids after midnight.  Take these medicines the morning of surgery with A SIP OF WATER  Xanax.   Do not wear jewelry, make-up or nail polish.  Do not wear lotions, powders, or perfumes, or deoderant.  Do not shave 48 hours prior to surgery.  Men may shave face and neck.  Do not bring valuables to the hospital.  [facility] is not responsible for any belongings or valuables.  Contacts, dentures or bridgework may not be worn into surgery.  Leave your suitcase in the car.  After surgery it may be brought to your room.  For patients admitted to the hospital, discharge time will be determined by your treatment team.  Patients discharged the day of surgery will not be allowed to drive home.   Name and phone number of your driver:   family Special instructions:  Follow the diet and prep instructions given to you by Dr Nona Dell office.  Please read over the following fact sheets that you were given. Anesthesia Post-op Instructions and Care and Recovery After Surgery       Esophagogastroduodenoscopy Esophagogastroduodenoscopy (EGD) is a procedure to examine the lining of the esophagus, stomach, and first part of the small intestine (duodenum). This procedure is done to check for problems such as inflammation, bleeding, ulcers, or growths. During this procedure, a long, flexible, lighted tube with a camera attached (endoscope) is inserted down the throat. Tell a health care provider about:  Any allergies you have.  All medicines you are taking, including vitamins, herbs, eye drops, creams, and over-the-counter medicines.  Any problems you or family members have had with anesthetic medicines.  Any blood  disorders you have.  Any surgeries you have had.  Any medical conditions you have.  Whether you are pregnant or may be pregnant. What are the risks? Generally, this is a safe procedure. However, problems may occur, including:  Infection.  Bleeding.  A tear (perforation) in the esophagus, stomach, or duodenum.  Trouble breathing.  Excessive sweating.  Spasms of the larynx.  A slowed heartbeat.  Low blood pressure.  What happens before the procedure?  Follow instructions from your health care provider about eating or drinking restrictions.  Ask your health care provider about: ? Changing or stopping your regular medicines. This is especially important if you are taking diabetes medicines or blood thinners. ? Taking medicines such as aspirin and ibuprofen. These medicines can thin your blood. Do not take these medicines before your procedure if your health care provider instructs you not to.  Plan to have someone take you home after the procedure.  If you wear dentures, be ready to remove them before the procedure. What happens during the procedure?  To reduce your risk of infection, your health care team will wash or sanitize their hands.  An IV tube will be put in a vein in your hand or arm. You will get medicines and fluids through this tube.  You will be given one or more of the following: ? A medicine to help you relax (sedative). ? A medicine to numb the area (local anesthetic). This medicine  may be sprayed into your throat. It will make you feel more comfortable and keep you from gagging or coughing during the procedure. ? A medicine for pain.  A mouth guard may be placed in your mouth to protect your teeth and to keep you from biting on the endoscope.  You will be asked to lie on your left side.  The endoscope will be lowered down your throat into your esophagus, stomach, and duodenum.  Air will be put into the endoscope. This will help your health care  provider see better.  The lining of your esophagus, stomach, and duodenum will be examined.  Your health care provider may: ? Take a tissue sample so it can be looked at in a lab (biopsy). ? Remove growths. ? Remove objects (foreign bodies) that are stuck. ? Treat any bleeding with medicines or other devices that stop tissue from bleeding. ? Widen (dilate) or stretch narrowed areas of your esophagus and stomach.  The endoscope will be taken out. The procedure may vary among health care providers and hospitals. What happens after the procedure?  Your blood pressure, heart rate, breathing rate, and blood oxygen level will be monitored often until the medicines you were given have worn off.  Do not eat or drink anything until the numbing medicine has worn off and your gag reflex has returned. This information is not intended to replace advice given to you by your health care provider. Make sure you discuss any questions you have with your health care provider. Document Released: 08/15/2004 Document Revised: 09/20/2015 Document Reviewed: 03/08/2015 Elsevier Interactive Patient Education  2018 [employer]. Esophagogastroduodenoscopy, Care After Refer to this sheet in the next few weeks. These instructions provide you with information about caring for yourself after your procedure. Your health care provider may also give you more specific instructions. Your treatment has been planned according to current medical practices, but problems sometimes occur. Call your health care provider if you have any problems or questions after your procedure. What can I expect after the procedure? After the procedure, it is common to have:  A sore throat.  Nausea.  Bloating.  Dizziness.  Fatigue.  Follow these instructions at home:  Do not eat or drink anything until the numbing medicine (local anesthetic) has worn off and your gag reflex has returned. You will know that the local anesthetic has worn  off when you can swallow comfortably.  Do not drive for 24 hours if you received a medicine to help you relax (sedative).  If your health care provider took a tissue sample for testing during the procedure, make sure to get your test results. This is your responsibility. Ask your health care provider or the department performing the test when your results will be ready.  Keep all follow-up visits as told by your health care provider. This is important. Contact a health care provider if:  You cannot stop coughing.  You are not urinating.  You are urinating less than usual. Get help right away if:  You have trouble swallowing.  You cannot eat or drink.  You have throat or chest pain that gets worse.  You are dizzy or light-headed.  You faint.  You have nausea or vomiting.  You have chills.  You have a fever.  You have severe abdominal pain.  You have black, tarry, or bloody stools. This information is not intended to replace advice given to you by your health care provider. Make sure you discuss any questions you have  with your health care provider. Document Released: 03/31/2012 Document Revised: 09/20/2015 Document Reviewed: 03/08/2015 Elsevier Interactive Patient Education  2018 [employer].  Colonoscopy, Adult A colonoscopy is an exam to look at the entire large intestine. During the exam, a lubricated, bendable tube is inserted into the anus and then passed into the rectum, colon, and other parts of the large intestine. A colonoscopy is often done as a part of normal colorectal screening or in response to certain symptoms, such as anemia, persistent diarrhea, abdominal pain, and blood in the stool. The exam can help screen for and diagnose medical problems, including:  Tumors.  Polyps.  Inflammation.  Areas of bleeding.  Tell a health care provider about:  Any allergies you have.  All medicines you are taking, including vitamins, herbs, eye drops, creams,  and over-the-counter medicines.  Any problems you or family members have had with anesthetic medicines.  Any blood disorders you have.  Any surgeries you have had.  Any medical conditions you have.  Any problems you have had passing stool. What are the risks? Generally, this is a safe procedure. However, problems may occur, including:  Bleeding.  A tear in the intestine.  A reaction to medicines given during the exam.  Infection (rare).  What happens before the procedure? Eating and drinking restrictions Follow instructions from your health care provider about eating and drinking, which may include:  A few days before the procedure - follow a low-fiber diet. Avoid nuts, seeds, dried fruit, raw fruits, and vegetables.  1-3 days before the procedure - follow a clear liquid diet. Drink only clear liquids, such as clear broth or bouillon, black coffee or tea, clear juice, clear soft drinks or sports drinks, gelatin dessert, and popsicles. Avoid any liquids that contain red or purple dye.  On the day of the procedure - do not eat or drink anything during the 2 hours before the procedure, or within the time period that your health care provider recommends.  Bowel prep If you were prescribed an oral bowel prep to clean out your colon:  Take it as told by your health care provider. Starting the day before your procedure, you will need to drink a large amount of medicated liquid. The liquid will cause you to have multiple loose stools until your stool is almost clear or light green.  If your skin or anus gets irritated from diarrhea, you may use these to relieve the irritation: ? Medicated wipes, such as adult wet wipes with aloe and vitamin E. ? A skin soothing-product like petroleum jelly.  If you vomit while drinking the bowel prep, take a break for up to 60 minutes and then begin the bowel prep again. If vomiting continues and you cannot take the bowel prep without vomiting, call  your health care provider.  General instructions  Ask your health care provider about changing or stopping your regular medicines. This is especially important if you are taking diabetes medicines or blood thinners.  Plan to have someone take you home from the hospital or clinic. What happens during the procedure?  An IV tube may be inserted into one of your veins.  You will be given medicine to help you relax (sedative).  To reduce your risk of infection: ? Your health care team will wash or sanitize their hands. ? Your anal area will be washed with soap.  You will be asked to lie on your side with your knees bent.  Your health care provider will lubricate a  long, thin, flexible tube. The tube will have a camera and a light on the end.  The tube will be inserted into your anus.  The tube will be gently eased through your rectum and colon.  Air will be delivered into your colon to keep it open. You may feel some pressure or cramping.  The camera will be used to take images during the procedure.  A small tissue sample may be removed from your body to be examined under a microscope (biopsy). If any potential problems are found, the tissue will be sent to a lab for testing.  If small polyps are found, your health care provider may remove them and have them checked for cancer cells.  The tube that was inserted into your anus will be slowly removed. The procedure may vary among health care providers and hospitals. What happens after the procedure?  Your blood pressure, heart rate, breathing rate, and blood oxygen level will be monitored until the medicines you were given have worn off.  Do not drive for 24 hours after the exam.  You may have a small amount of blood in your stool.  You may pass gas and have mild abdominal cramping or bloating due to the air that was used to inflate your colon during the exam.  It is up to you to get the results of your procedure. Ask your  health care provider, or the department performing the procedure, when your results will be ready. This information is not intended to replace advice given to you by your health care provider. Make sure you discuss any questions you have with your health care provider. Document Released: 04/11/2000 Document Revised: 02/13/2016 Document Reviewed: 06/26/2015 Elsevier Interactive Patient Education  2018 [employer].  Colonoscopy, Adult, Care After This sheet gives you information about how to care for yourself after your procedure. Your health care provider may also give you more specific instructions. If you have problems or questions, contact your health care provider. What can I expect after the procedure? After the procedure, it is common to have:  A small amount of blood in your stool for 24 hours after the procedure.  Some gas.  Mild abdominal cramping or bloating.  Follow these instructions at home: General instructions   For the first 24 hours after the procedure: ? Do not drive or use machinery. ? Do not sign important documents. ? Do not drink alcohol. ? Do your regular daily activities at a slower pace than normal. ? Eat soft, easy-to-digest foods. ? Rest often.  Take over-the-counter or prescription medicines only as told by your health care provider.  It is up to you to get the results of your procedure. Ask your health care provider, or the department performing the procedure, when your results will be ready. Relieving cramping and bloating  Try walking around when you have cramps or feel bloated.  Apply heat to your abdomen as told by your health care provider. Use a heat source that your health care provider recommends, such as a moist heat pack or a heating pad. ? Place a towel between your skin and the heat source. ? Leave the heat on for 20-30 minutes. ? Remove the heat if your skin turns bright red. This is especially important if you are unable to feel pain,  heat, or cold. You may have a greater risk of getting burned. Eating and drinking  Drink enough fluid to keep your urine clear or pale yellow.  Resume your normal diet  as instructed by your health care provider. Avoid heavy or fried foods that are hard to digest.  Avoid drinking alcohol for as long as instructed by your health care provider. Contact a health care provider if:  You have blood in your stool 2-3 days after the procedure. Get help right away if:  You have more than a small spotting of blood in your stool.  You pass large blood clots in your stool.  Your abdomen is swollen.  You have nausea or vomiting.  You have a fever.  You have increasing abdominal pain that is not relieved with medicine. This information is not intended to replace advice given to you by your health care provider. Make sure you discuss any questions you have with your health care provider. Document Released: 11/27/2003 Document Revised: 01/07/2016 Document Reviewed: 06/26/2015 Elsevier Interactive Patient Education  2018 [place] Anesthesia is a term that refers to techniques, procedures, and medicines that help a person stay safe and comfortable during a medical procedure. Monitored anesthesia care, or sedation, is one type of anesthesia. Your anesthesia specialist may recommend sedation if you will be having a procedure that does not require you to be unconscious, such as:  Cataract surgery.  A dental procedure.  A biopsy.  A colonoscopy.  During the procedure, you may receive a medicine to help you relax (sedative). There are three levels of sedation:  Mild sedation. At this level, you may feel awake and relaxed. You will be able to follow directions.  Moderate sedation. At this level, you will be sleepy. You may not remember the procedure.  Deep sedation. At this level, you will be asleep. You will not remember the procedure.  The more medicine you  are given, the deeper your level of sedation will be. Depending on how you respond to the procedure, the anesthesia specialist may change your level of sedation or the type of anesthesia to fit your needs. An anesthesia specialist will monitor you closely during the procedure. Let your health care provider know about:  Any allergies you have.  All medicines you are taking, including vitamins, herbs, eye drops, creams, and over-the-counter medicines.  Any use of steroids (by mouth or as a cream).  Any problems you or family members have had with sedatives and anesthetic medicines.  Any blood disorders you have.  Any surgeries you have had.  Any medical conditions you have, such as sleep apnea.  Whether you are pregnant or may be pregnant.  Any use of cigarettes, alcohol, or street drugs. What are the risks? Generally, this is a safe procedure. However, problems may occur, including:  Getting too much medicine (oversedation).  Nausea.  Allergic reaction to medicines.  Trouble breathing. If this happens, a breathing tube may be used to help with breathing. It will be removed when you are awake and breathing on your own.  Heart trouble.  Lung trouble.  Before the procedure Staying hydrated Follow instructions from your health care provider about hydration, which may include:  Up to 2 hours before the procedure - you may continue to drink clear liquids, such as water, clear fruit juice, black coffee, and plain tea.  Eating and drinking restrictions Follow instructions from your health care provider about eating and drinking, which may include:  8 hours before the procedure - stop eating heavy meals or foods such as meat, fried foods, or fatty foods.  6 hours before the procedure - stop eating light meals or foods, such  as toast or cereal.  6 hours before the procedure - stop drinking milk or drinks that contain milk.  2 hours before the procedure - stop drinking clear  liquids.  Medicines Ask your health care provider about:  Changing or stopping your regular medicines. This is especially important if you are taking diabetes medicines or blood thinners.  Taking medicines such as aspirin and ibuprofen. These medicines can thin your blood. Do not take these medicines before your procedure if your health care provider instructs you not to.  Tests and exams  You will have a physical exam.  You may have blood tests done to show: ? How well your kidneys and liver are working. ? How well your blood can clot.  General instructions  Plan to have someone take you home from the hospital or clinic.  If you will be going home right after the procedure, plan to have someone with you for 24 hours.  What happens during the procedure?  Your blood pressure, heart rate, breathing, level of pain and overall condition will be monitored.  An IV tube will be inserted into one of your veins.  Your anesthesia specialist will give you medicines as needed to keep you comfortable during the procedure. This may mean changing the level of sedation.  The procedure will be performed. After the procedure  Your blood pressure, heart rate, breathing rate, and blood oxygen level will be monitored until the medicines you were given have worn off.  Do not drive for 24 hours if you received a sedative.  You may: ? Feel sleepy, clumsy, or nauseous. ? Feel forgetful about what happened after the procedure. ? Have a sore throat if you had a breathing tube during the procedure. ? Vomit. This information is not intended to replace advice given to you by your health care provider. Make sure you discuss any questions you have with your health care provider. Document Released: 01/08/2005 Document Revised: 09/21/2015 Document Reviewed: 08/05/2015 Elsevier Interactive Patient Education  2018 [place], Care After These instructions provide you with  information about caring for yourself after your procedure. Your health care provider may also give you more specific instructions. Your treatment has been planned according to current medical practices, but problems sometimes occur. Call your health care provider if you have any problems or questions after your procedure. What can I expect after the procedure? After your procedure, it is common to:  Feel sleepy for several hours.  Feel clumsy and have poor balance for several hours.  Feel forgetful about what happened after the procedure.  Have poor judgment for several hours.  Feel nauseous or vomit.  Have a sore throat if you had a breathing tube during the procedure.  Follow these instructions at home: For at least 24 hours after the procedure:   Do not: ? Participate in activities in which you could fall or become injured. ? Drive. ? Use heavy machinery. ? Drink alcohol. ? Take sleeping pills or medicines that cause drowsiness. ? Make important decisions or sign legal documents. ? Take care of children on your own.  Rest. Eating and drinking  Follow the diet that is recommended by your health care provider.  If you vomit, drink water, juice, or soup when you can drink without vomiting.  Make sure you have little or no nausea before eating solid foods. General instructions  Have a responsible adult stay with you until you are awake and alert.  Take over-the-counter and prescription  medicines only as told by your health care provider.  If you smoke, do not smoke without supervision.  Keep all follow-up visits as told by your health care provider. This is important. Contact a health care provider if:  You keep feeling nauseous or you keep vomiting.  You feel light-headed.  You develop a rash.  You have a fever. Get help right away if:  You have trouble breathing. This information is not intended to replace advice given to you by your health care provider.  Make sure you discuss any questions you have with your health care provider. Document Released: 08/05/2015 Document Revised: 12/05/2015 Document Reviewed: 08/05/2015 Elsevier Interactive Patient Education  [employer].

## 2017-01-20 NOTE — Telephone Encounter (Signed)
Received fax from [facility], pt has appt 02/02/17 at 2:15pm.
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
## 2017-01-21 ENCOUNTER — Encounter (HOSPITAL_COMMUNITY): Payer: Self-pay

## 2017-01-21 ENCOUNTER — Encounter (HOSPITAL_COMMUNITY)
Admission: RE | Admit: 2017-01-21 | Discharge: 2017-01-21 | Disposition: A | Discharge status: Home / Self Care | Payer: Medicare Other | Source: Ambulatory Visit | Attending: Gastroenterology | Admitting: Gastroenterology

## 2017-01-21 DIAGNOSIS — Z1211 Encounter for screening for malignant neoplasm of colon: Secondary | ICD-10-CM | POA: Diagnosis not present

## 2017-01-21 DIAGNOSIS — Z01818 Encounter for other preprocedural examination: Secondary | ICD-10-CM | POA: Insufficient documentation

## 2017-01-21 DIAGNOSIS — B1920 Unspecified viral hepatitis C without hepatic coma: Secondary | ICD-10-CM | POA: Diagnosis not present

## 2017-01-21 LAB — BASIC METABOLIC PANEL
Anion gap: 10 (ref 5–15)
BUN: 10 mg/dL (ref 6–20)
CALCIUM: 9.5 mg/dL (ref 8.9–10.3)
CHLORIDE: 102 mmol/L (ref 101–111)
CO2: 25 mmol/L (ref 22–32)
CREATININE: 0.75 mg/dL (ref 0.44–1.00)
GFR calc Af Amer: >60 mL/min (ref >60)
GFR calc non Af Amer: >60 mL/min (ref >60)
GLUCOSE: 140 mg/dL — AB (ref 65–99)
Potassium: 3.9 mmol/L (ref 3.5–5.1)
Sodium: 137 mmol/L (ref 135–145)

## 2017-01-21 LAB — CBC WITH DIFFERENTIAL/PLATELET
Basophils Absolute: 0 10*3/uL (ref 0.0–0.1)
Basophils Relative: 0 %
Eosinophils Absolute: 0.1 10*3/uL (ref 0.0–0.7)
Eosinophils Relative: 2 %
HEMATOCRIT: 39.7 % (ref 36.0–46.0)
HEMOGLOBIN: 12.4 g/dL (ref 12.0–15.0)
LYMPHS ABS: 0.7 10*3/uL (ref 0.7–4.0)
LYMPHS PCT: 20 %
MCH: 27.2 pg (ref 26.0–34.0)
MCHC: 31.2 g/dL (ref 30.0–36.0)
MCV: 87.1 fL (ref 78.0–100.0)
MONO ABS: 0.3 10*3/uL (ref 0.1–1.0)
MONOS PCT: 8 %
NEUTROS ABS: 2.3 10*3/uL (ref 1.7–7.7)
NEUTROS PCT: 70 %
Platelets: 104 10*3/uL — ABNORMAL LOW (ref 150–400)
RBC: 4.56 MIL/uL (ref 3.87–5.11)
RDW: 13.9 % (ref 11.5–15.5)
WBC: 3.3 10*3/uL — ABNORMAL LOW (ref 4.0–10.5)

## 2017-01-22 NOTE — Pre-Procedure Instructions (Signed)
Dr Patsey Berthold aware of abnormal labs. No orders given.

## 2017-01-25 LAB — RFX TO GENOSURE(R) HCV NS3/4A

## 2017-01-25 LAB — HCV GENOTYPE REFLEX NS3/4A

## 2017-01-27 ENCOUNTER — Ambulatory Visit (HOSPITAL_COMMUNITY): Payer: Medicare Other | Admitting: Anesthesiology

## 2017-01-27 ENCOUNTER — Ambulatory Visit (HOSPITAL_COMMUNITY)
Admission: RE | Admit: 2017-01-27 | Discharge: 2017-01-27 | Disposition: A | Discharge status: Home / Self Care | Payer: Medicare Other | Source: Ambulatory Visit | Attending: Gastroenterology | Admitting: Gastroenterology

## 2017-01-27 ENCOUNTER — Encounter (HOSPITAL_COMMUNITY): Payer: Self-pay | Admitting: Emergency Medicine

## 2017-01-27 ENCOUNTER — Encounter (HOSPITAL_COMMUNITY)
Admission: RE | Disposition: A | Discharge status: Home / Self Care | Payer: Self-pay | Source: Ambulatory Visit | Attending: Gastroenterology

## 2017-01-27 DIAGNOSIS — F4310 Post-traumatic stress disorder, unspecified: Secondary | ICD-10-CM | POA: Insufficient documentation

## 2017-01-27 DIAGNOSIS — K317 Polyp of stomach and duodenum: Secondary | ICD-10-CM | POA: Diagnosis not present

## 2017-01-27 DIAGNOSIS — K319 Disease of stomach and duodenum, unspecified: Secondary | ICD-10-CM | POA: Insufficient documentation

## 2017-01-27 DIAGNOSIS — Z79899 Other long term (current) drug therapy: Secondary | ICD-10-CM | POA: Insufficient documentation

## 2017-01-27 DIAGNOSIS — K2960 Other gastritis without bleeding: Secondary | ICD-10-CM

## 2017-01-27 DIAGNOSIS — Z1212 Encounter for screening for malignant neoplasm of rectum: Secondary | ICD-10-CM | POA: Diagnosis not present

## 2017-01-27 DIAGNOSIS — K2970 Gastritis, unspecified, without bleeding: Secondary | ICD-10-CM | POA: Diagnosis not present

## 2017-01-27 DIAGNOSIS — T39395A Adverse effect of other nonsteroidal anti-inflammatory drugs [NSAID], initial encounter: Secondary | ICD-10-CM

## 2017-01-27 DIAGNOSIS — I8500 Esophageal varices without bleeding: Secondary | ICD-10-CM

## 2017-01-27 DIAGNOSIS — K5730 Diverticulosis of large intestine without perforation or abscess without bleeding: Secondary | ICD-10-CM | POA: Diagnosis not present

## 2017-01-27 DIAGNOSIS — E079 Disorder of thyroid, unspecified: Secondary | ICD-10-CM | POA: Insufficient documentation

## 2017-01-27 DIAGNOSIS — Z1211 Encounter for screening for malignant neoplasm of colon: Secondary | ICD-10-CM | POA: Insufficient documentation

## 2017-01-27 DIAGNOSIS — Z87891 Personal history of nicotine dependence: Secondary | ICD-10-CM | POA: Insufficient documentation

## 2017-01-27 DIAGNOSIS — G8929 Other chronic pain: Secondary | ICD-10-CM | POA: Diagnosis not present

## 2017-01-27 DIAGNOSIS — B1920 Unspecified viral hepatitis C without hepatic coma: Secondary | ICD-10-CM | POA: Insufficient documentation

## 2017-01-27 DIAGNOSIS — F329 Major depressive disorder, single episode, unspecified: Secondary | ICD-10-CM | POA: Diagnosis not present

## 2017-01-27 HISTORY — PX: COLONOSCOPY WITH PROPOFOL: SHX5780

## 2017-01-27 HISTORY — PX: BIOPSY: SHX5522

## 2017-01-27 HISTORY — PX: ESOPHAGOGASTRODUODENOSCOPY (EGD) WITH PROPOFOL: SHX5813

## 2017-01-27 SURGERY — COLONOSCOPY WITH PROPOFOL
Anesthesia: Monitor Anesthesia Care

## 2017-01-27 MED ORDER — PROPOFOL 10 MG/ML IV BOLUS
INTRAVENOUS | Status: AC
  Filled 2017-01-27: qty 20

## 2017-01-27 MED ORDER — MIDAZOLAM HCL 2 MG/2ML IJ SOLN
INTRAMUSCULAR | Status: AC
  Filled 2017-01-27: qty 2

## 2017-01-27 MED ORDER — LIDOCAINE VISCOUS 2 % MT SOLN
OROMUCOSAL | Status: DC | PRN
  Administered 2017-01-27: 5 mL via OROMUCOSAL

## 2017-01-27 MED ORDER — FENTANYL CITRATE (PF) 100 MCG/2ML IJ SOLN
INTRAMUSCULAR | Status: AC
  Filled 2017-01-27: qty 2

## 2017-01-27 MED ORDER — CHLORHEXIDINE GLUCONATE CLOTH 2 % EX PADS: 6.0000 | MEDICATED_PAD | Freq: Once | CUTANEOUS | Status: DC

## 2017-01-27 MED ORDER — MIDAZOLAM HCL 5 MG/5ML IJ SOLN
INTRAMUSCULAR | Status: DC | PRN
  Administered 2017-01-27 (×2): 2 mg via INTRAVENOUS

## 2017-01-27 MED ORDER — PROPOFOL 10 MG/ML IV BOLUS
INTRAVENOUS | Status: AC
  Filled 2017-01-27: qty 40

## 2017-01-27 MED ORDER — FENTANYL CITRATE (PF) 100 MCG/2ML IJ SOLN
25.0000 ug | Freq: Once | INTRAMUSCULAR | Status: AC
  Administered 2017-01-27: 25 ug via INTRAVENOUS

## 2017-01-27 MED ORDER — MIDAZOLAM HCL 2 MG/2ML IJ SOLN
1.0000 mg | INTRAMUSCULAR | Status: AC
  Administered 2017-01-27: 2 mg via INTRAVENOUS

## 2017-01-27 MED ORDER — LACTATED RINGERS IV SOLN
INTRAVENOUS | Status: DC
  Administered 2017-01-27: 11:00:00 via INTRAVENOUS

## 2017-01-27 MED ORDER — OMEPRAZOLE 20 MG PO CPDR: DELAYED_RELEASE_CAPSULE | ORAL | 3 refills | Status: DC

## 2017-01-27 MED ORDER — PROPOFOL 500 MG/50ML IV EMUL
INTRAVENOUS | Status: DC | PRN
  Administered 2017-01-27: 12:00:00 via INTRAVENOUS
  Administered 2017-01-27: 125 ug/kg/min via INTRAVENOUS

## 2017-01-27 NOTE — Anesthesia Preprocedure Evaluation (Signed)
Anesthesia Evaluation  Patient identified by MRN, date of birth, ID band Patient awake    Reviewed: Allergy & Precautions, NPO status , [name] Chart, lab work & pertinent test results  Airway Mallampati: I  TM Distance: >3 FB     Dental  (+) Poor Dentition, Implants, Loose,    Pulmonary former smoker,    breath sounds clear to auscultation       Cardiovascular negative cardio ROS   Rhythm:Regular Rate:Normal     Neuro/Psych PSYCHIATRIC DISORDERS (PTSD) Anxiety Depression    GI/Hepatic (+) Hepatitis -, C  Endo/Other  Hypothyroidism   Renal/GU      Musculoskeletal   Abdominal   Peds  Hematology   Anesthesia Other Findings   Reproductive/Obstetrics                             Anesthesia Physical Anesthesia Plan  ASA: III  Anesthesia Plan: MAC   Post-op Pain Management:    Induction: Intravenous  PONV Risk Score and Plan:   Airway Management Planned: Simple Face Mask  Additional Equipment:   Intra-op Plan:   Post-operative Plan:   Informed Consent: I have reviewed the patients History and Physical, chart, labs and discussed the procedure including the risks, benefits and alternatives for the proposed anesthesia with the patient or authorized representative who has indicated his/her understanding and acceptance.     Plan Discussed with:   Anesthesia Plan Comments:         Anesthesia Quick Evaluation

## 2017-01-27 NOTE — Discharge Instructions (Signed)
YOU DID NOT HAVE ANY POLYPS. You have EROSIVE gastritis DUE TO IBUPROFEN. You have diverticulosis IN YOUR LEFT COLON. biopsied your stomach.   DRINK WATER TO KEEP YOUR URINE LIGHT YELLOW.  FOLLOW A HIGH FIBER/LOW FAT DIET. AVOID ITEMS THAT CAUSE BLOATING. SEE INFO BELOW.  CONTINUE OMEPRAZOLE.  TAKE 30 MINUTES PRIOR TO YOUR FIRST MEAL.  YOUR BIOPSY RESULTS WILL BE AVAILABLE IN MY CHART AFTER OCT 5 AND MY OFFICE WILL CONTACT YOU IN 10-14 DAYS WITH YOUR RESULTS.   FOLLOW UP IN 4-6 MOS TO START HEP C TREATMENT.   Next colonoscopy in 10 years.   ENDOSCOPY Care After Read the instructions outlined below and refer to this sheet in the next week. These discharge instructions provide you with general information on caring for yourself after you leave the hospital. While your treatment has been planned according to the most current medical practices available, unavoidable complications occasionally occur. If you have any problems or questions after discharge, call DR. FIELDS, [phone].  ACTIVITY  You may resume your regular activity, but move at a slower pace for the next 24 hours.   Take frequent rest periods for the next 24 hours.   Walking will help get rid of the air and reduce the bloated feeling in your belly (abdomen).   No driving for 24 hours (because of the medicine (anesthesia) used during the test).   You may shower.   Do not sign any important legal documents or operate any machinery for 24 hours (because of the anesthesia used during the test).    NUTRITION  Drink plenty of fluids.   You may resume your normal diet as instructed by your doctor.   Begin with a light meal and progress to your normal diet. Heavy or fried foods are harder to digest and may make you feel sick to your stomach (nauseated).   Avoid alcoholic beverages for 24 hours or as instructed.    MEDICATIONS  You may resume your normal medications.   WHAT YOU CAN EXPECT TODAY  Some feelings  of bloating in the abdomen.   Passage of more gas than usual.   Spotting of blood in your stool or on the toilet paper  .  IF YOU HAD POLYPS REMOVED DURING THE ENDOSCOPY:  Eat a soft diet IF YOU HAVE NAUSEA, BLOATING, ABDOMINAL PAIN, OR VOMITING.    FINDING OUT THE RESULTS OF YOUR TEST Not all test results are available during your visit. DR. Oneida Alar WILL CALL YOU WITHIN 14 DAYS OF YOUR PROCEDUE WITH YOUR RESULTS. Do not assume everything is normal if you have not heard from DR. FIELDS, CALL HER OFFICE AT [phone].  SEEK IMMEDIATE MEDICAL ATTENTION AND CALL THE OFFICE: [phone] IF:  You have more than a spotting of blood in your stool.   Your belly is swollen (abdominal distention).   You are nauseated or vomiting.   You have a temperature over 101F.   You have abdominal pain or discomfort that is severe or gets worse throughout the day.   Colonoscopy, Adult, Care After This sheet gives you information about how to care for yourself after your procedure. Your health care provider may also give you more specific instructions. If you have problems or questions, contact your health care provider. What can I expect after the procedure? After the procedure, it is common to have:  A small amount of blood in your stool for 24 hours after the procedure.  Some gas.  Mild abdominal cramping or bloating.  Follow these instructions at home: General instructions   For the first 24 hours after the procedure: ? Do not drive or use machinery. ? Do not sign important documents. ? Do not drink alcohol. ? Do your regular daily activities at a slower pace than normal. ? Eat soft, easy-to-digest foods. ? Rest often.  Take over-the-counter or prescription medicines only as told by your health care provider.  It is up to you to get the results of your procedure. Ask your health care provider, or the department performing the procedure, when your results will be ready. Relieving  cramping and bloating  Try walking around when you have cramps or feel bloated.  Apply heat to your abdomen as told by your health care provider. Use a heat source that your health care provider recommends, such as a moist heat pack or a heating pad. ? Place a towel between your skin and the heat source. ? Leave the heat on for 20-30 minutes. ? Remove the heat if your skin turns bright red. This is especially important if you are unable to feel pain, heat, or cold. You may have a greater risk of getting burned. Eating and drinking  Drink enough fluid to keep your urine clear or pale yellow.  Resume your normal diet as instructed by your health care provider. Avoid heavy or fried foods that are hard to digest.  Avoid drinking alcohol for as long as instructed by your health care provider. Contact a health care provider if:  You have blood in your stool 2-3 days after the procedure. Get help right away if:  You have more than a small spotting of blood in your stool.  You pass large blood clots in your stool.  Your abdomen is swollen.  You have nausea or vomiting.  You have a fever.  You have increasing abdominal pain that is not relieved with medicine. This information is not intended to replace advice given to you by your health care provider. Make sure you discuss any questions you have with your health care provider. Document Released: 11/27/2003 Document Revised: 01/07/2016 Document Reviewed: 06/26/2015 Elsevier Interactive Patient Education  2018 [place].    Gastritis  Gastritis is an inflammation (the body's way of reacting to injury and/or infection) of the stomach. It is often caused by viral or bacterial (germ) infections. It can also be caused BY ASPIRIN, BC/GOODY POWDER'S, (IBUPROFEN) MOTRIN, OR ALEVE (NAPROXEN), chemicals (including alcohol), SPICY FOODS, and medications. This illness may be associated with generalized malaise (feeling tired, not well), UPPER  ABDOMINAL STOMACH cramps, and fever. One common bacterial cause of gastritis is an organism known as H. Pylori. This can be treated with antibiotics.    High-Fiber Diet A high-fiber diet changes your normal diet to include more whole grains, legumes, fruits, and vegetables. Changes in the diet involve replacing refined carbohydrates with unrefined foods. The calorie level of the diet is essentially unchanged. The Dietary Reference Intake (recommended amount) for adult males is 38 grams per day. For adult females, it is 25 grams per day. Pregnant and lactating women should consume 28 grams of fiber per day. Fiber is the intact part of a plant that is not broken down during digestion. Functional fiber is fiber that has been isolated from the plant to provide a beneficial effect in the body. PURPOSE  Increase stool bulk.   Ease and regulate bowel movements.   Lower cholesterol.   REDUCE RISK OF COLON CANCER  INDICATIONS THAT YOU NEED MORE FIBER  Constipation and hemorrhoids.   Uncomplicated diverticulosis (intestine condition) and irritable bowel syndrome.   Weight management.   As a protective measure against hardening of the arteries (atherosclerosis), diabetes, and cancer.   GUIDELINES FOR INCREASING FIBER IN THE DIET  Start adding fiber to the diet slowly. A gradual increase of about 5 more grams (2 slices of whole-wheat bread, 2 servings of most fruits or vegetables, or 1 bowl of high-fiber cereal) per day is best. Too rapid an increase in fiber may result in constipation, flatulence, and bloating.   Drink enough water and fluids to keep your urine clear or pale yellow. Water, juice, or caffeine-free drinks are recommended. Not drinking enough fluid may cause constipation.   Eat a variety of high-fiber foods rather than one type of fiber.   Try to increase your intake of fiber through using high-fiber foods rather than fiber pills or supplements that contain small amounts of  fiber.   The goal is to change the types of food eaten. Do not supplement your present diet with high-fiber foods, but replace foods in your present diet.     INCLUDE A VARIETY OF FIBER SOURCES  Replace refined and processed grains with whole grains, canned fruits with fresh fruits, and incorporate other fiber sources. White rice, white breads, and most bakery goods contain little or no fiber.   Brown whole-grain rice, buckwheat oats, and many fruits and vegetables are all good sources of fiber. These include: broccoli, Brussels sprouts, cabbage, cauliflower, beets, sweet potatoes, white potatoes (skin on), carrots, tomatoes, eggplant, squash, berries, fresh fruits, and dried fruits.   Cereals appear to be the richest source of fiber. Cereal fiber is found in whole grains and bran. Bran is the fiber-rich outer coat of cereal grain, which is largely removed in refining. In whole-grain cereals, the bran remains. In breakfast cereals, the largest amount of fiber is found in those with "bran" in their names. The fiber content is sometimes indicated on the label.   You may need to include additional fruits and vegetables each day.   In baking, for 1 cup white flour, you may use the following substitutions:   1 cup whole-wheat flour minus 2 tablespoons.   1/2 cup white flour plus 1/2 cup whole-wheat flour.  Diverticulosis Diverticulosis is a common condition that develops when small pouches (diverticula) form in the wall of the colon. The risk of diverticulosis increases with age. It happens more often in people who eat a low-fiber diet. Most individuals with diverticulosis have no symptoms. Those individuals with symptoms usually experience belly (abdominal) pain, constipation, or loose stools (diarrhea).  HOME CARE INSTRUCTIONS  Increase the amount of fiber in your diet as directed by your caregiver or dietician. This may reduce symptoms of diverticulosis.   Drink at least 6 to 8 glasses of  water each day to prevent constipation.   Try not to strain when you have a bowel movement.   Avoiding nuts and seeds to prevent complications is NOT NECESSARY.   FOODS HAVING HIGH FIBER CONTENT INCLUDE:  Fruits. Apple, peach, pear, tangerine, raisins, prunes.   Vegetables. Brussels sprouts, asparagus, broccoli, cabbage, carrot, cauliflower, romaine lettuce, spinach, summer squash, tomato, winter squash, zucchini.   Starchy Vegetables. Baked beans, kidney beans, lima beans, split peas, lentils, potatoes (with skin).   Grains. Whole wheat bread, brown rice, bran flake cereal, plain oatmeal, white rice, shredded wheat, bran muffins.    SEEK IMMEDIATE MEDICAL CARE IF:  You develop increasing pain or severe bloating.  You have an oral temperature above 101F.   You develop vomiting or bowel movements that are bloody or black.

## 2017-01-27 NOTE — Op Note (Signed)
[facility] Patient Name: Kathleen Palmer Procedure Date: 01/27/2017 11:30 AM MRN: [id number] Date of Birth: [birth year] Attending MD: Barney Drain MD, MD CSN: [id number] Age: 56 Admit Type: Outpatient Procedure:                Colonoscopy, SCREENING Indications:              Screening for colorectal malignant neoplasm Providers:                Barney Drain MD, MD, Janeece Riggers, RN, Randa Spike, Technician Referring MD:             Modena Nunnery. [place] Medicines:                Propofol per Anesthesia Complications:            No immediate complications. Estimated Blood Loss:     Estimated blood loss: none. Procedure:                Pre-Anesthesia Assessment:                           - Prior to the procedure, a History and Physical                            was performed, and patient medications and                            allergies were reviewed. The patient's tolerance of                            previous anesthesia was also reviewed. The risks                            and benefits of the procedure and the sedation                            options and risks were discussed with the patient.                            All questions were answered, and informed consent                            was obtained. Prior Anticoagulants: The patient has                            taken no previous anticoagulant or antiplatelet                            agents. ASA Grade Assessment: II - A patient with                            mild systemic disease. After reviewing the risks  and benefits, the patient was deemed in                            satisfactory condition to undergo the procedure.                            After obtaining informed consent, the colonoscope                            was passed under direct vision. Throughout the                            procedure, the patient's blood pressure, pulse, and                oxygen saturations were monitored continuously. The                            EC-[id number] ([id number]) scope was introduced through                            the anus and advanced to the the cecum, identified                            by appendiceal orifice and ileocecal valve. The                            colonoscopy was somewhat difficult due to                            restricted mobility of the colon. Successful                            completion of the procedure was aided by COLOWRAP.                            The patient tolerated the procedure well. The                            quality of the bowel preparation was excellent. The                            ileocecal valve, appendiceal orifice, and rectum                            were photographed. Scope In: 12:15:57 PM Scope Out: 12:26:43 PM Scope Withdrawal Time: 0 hours 8 minutes 25 seconds  Total Procedure Duration: 0 hours 10 minutes 46 seconds  Findings:      The recto-sigmoid colon and sigmoid colon were moderately redundant.      Multiple small and large-mouthed diverticula were found in the       recto-sigmoid colon, sigmoid colon, descending colon and cecum.      The exam was otherwise without abnormality.      The retroflexed view of the distal rectum and anal verge was normal and  showed no anal or rectal abnormalities. Impression:               - Redundant LEFT colon.                           - Diverticulosis in the recto-sigmoid colon, in the                            sigmoid colon, in the descending colon and in the                            cecum.                           - The examination was otherwise normal.                           - The distal rectum and anal verge are normal on                            retroflexion view. Moderate Sedation:      Per Anesthesia Care Recommendation:           - High fiber diet.                           - Continue present medications.                            - Await pathology results.                           - Repeat colonoscopy in 10 years for surveillance.                           - Return to GI office in 6 months.                           - Patient has a contact number available for                            emergencies. The signs and symptoms of potential                            delayed complications were discussed with the                            patient. Return to normal activities tomorrow.                            Written discharge instructions were provided to the                            patient. Procedure Code(s):        --- Professional ---  45378, Colonoscopy, flexible; diagnostic, including                            collection of specimen(s) by brushing or washing,                            when performed (separate procedure) Diagnosis Code(s):        --- Professional ---                           Z12.11, Encounter for screening for malignant                            neoplasm of colon                           K57.30, Diverticulosis of large intestine without                            perforation or abscess without bleeding                           Q43.8, Other specified congenital malformations of                            intestine CPT copyright 2016 American Medical Association. All rights reserved. The codes documented in this report are preliminary and upon coder review may  be revised to meet current compliance requirements. Barney Drain, MD Barney Drain MD, MD 01/27/2017 12:47:13 PM This report has been signed electronically. Number of Addenda: 0

## 2017-01-27 NOTE — Anesthesia Postprocedure Evaluation (Signed)
Anesthesia Post Note  Patient: Kathleen Palmer [facility]  Procedure(s) Performed: COLONOSCOPY WITH PROPOFOL (N/A ) ESOPHAGOGASTRODUODENOSCOPY (EGD) WITH PROPOFOL (N/A ) BIOPSY  Patient location during evaluation: PACU Anesthesia Type: MAC Level of consciousness: awake and patient cooperative Pain management: pain level controlled Vital Signs Assessment: post-procedure vital signs reviewed and stable Respiratory status: spontaneous breathing, nonlabored ventilation and respiratory function stable Cardiovascular status: blood pressure returned to baseline Postop Assessment: no apparent nausea or vomiting Anesthetic complications: no     Last Vitals:  Vitals:   01/27/17 1059  BP: 117/76  Pulse: 77  Temp: 36.8 C  SpO2: 96%    Last Pain:  Vitals:   01/27/17 1200  TempSrc:   PainSc: 8                  Hira Trent J

## 2017-01-27 NOTE — H&P (Signed)
Primary Care Physician:  Alycia Rossetti, MD Primary Gastroenterologist:  Dr. Oneida Alar  Pre-Procedure History & Physical: HPI:  Kathleen Palmer is a 56 y.o. female here for SCREENING FOR COLON CA AND VARICES.  Past Medical History:  Diagnosis Date  . Angiomyolipoma    Left kidney  . Anxiety   . Arthritis   . Attention to urostomy [facility])   . Chronic pain   . Depression   . Hepatitis C    ? Contracted through IVDA  . Panic 04/29/1999  . PTSD (post-traumatic stress disorder)   . Substance abuse ([place])    Remote history- cocaine, ETOH, Marijuana  . Thyroid disease     Past Surgical History:  Procedure Laterality Date  . ABDOMINAL HYSTERECTOMY    . ABDOMINAL SURGERY    . CHOLECYSTECTOMY    . HERNIA REPAIR     x2- abdomen  . ILEO LOOP CONDUIT    . 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 VAGINA RECONSTRUCTION SURGERY      Prior to Admission medications   Medication Sig Start Date End Date Taking? Authorizing Provider  ALPRAZolam Duanne Moron) 0.5 MG tablet Take 1 tablet (0.5 mg total) by mouth 4 (four) times daily. 01/06/17  Yes Cloria Spring, MD  Cyanocobalamin (B-12) 500 MCG TABS Take 1 tablet by mouth daily.   Yes [provider]  hydrOXYzine (ATARAX/VISTARIL) 25 MG tablet Take 1 tablet (25 mg total) by mouth every 6 (six) hours as needed for anxiety. 01/20/17  Yes Cloria Spring, MD  ibuprofen (ADVIL,MOTRIN) 600 MG tablet Take 600 mg by mouth every 6 (six) hours as needed for mild pain.  12/10/16  Yes [provider]  linaclotide Rolan Lipa) 72 MCG capsule Take 1 capsule (72 mcg total) by mouth daily before breakfast. 01/14/17  Yes Annitta Needs, NP  Multiple Vitamins-Minerals (CENTRUM SILVER PO) Take 1 tablet by mouth daily.   Yes [provider]  Na Sulfate-K Sulfate-Mg Sulf (SUPREP BOWEL PREP KIT) 17.5-3.13-1.6 GM/180ML SOLN Take 1 kit by  mouth as directed. 12/30/16  Yes Liala Codispoti, Marga Melnick, MD  neomycin-bacitracin-polymyxin (NEOSPORIN) ointment Apply 1 application topically as needed for wound care. apply to eye   Yes [provider]  tetrahydrozoline 0.05 % ophthalmic solution Place 1 drop into both eyes daily.   Yes [provider]  traZODone (DESYREL) 100 MG tablet Take 1 tablet (100 mg total) by mouth at bedtime as needed for sleep. Patient taking differently: Take 200 mg by mouth at bedtime as needed for sleep.  12/09/16  Yes Money, Lowry Ram, FNP    Allergies as of 12/30/2016 - Review Complete 12/30/2016  Allergen Reaction Noted  . Oxycodone Anxiety 03/05/2012  . Abilify [aripiprazole]  06/08/2013  . Ampicillin  12/01/2011  . Bactrim [sulfamethoxazole-trimethoprim]  12/01/2011  . Gabapentin  12/01/2011    Family History  Problem Relation Age of Onset  . Adopted: Yes  . Depression Mother   . Depression Sister   . Anxiety disorder Sister   . Bipolar disorder Sister   . Depression Maternal Grandmother   . ADD / ADHD Neg Hx   . Alcohol abuse Neg Hx   . Drug abuse Neg Hx   . Dementia Neg Hx   . OCD Neg Hx   . Paranoid behavior Neg Hx   . Schizophrenia Neg Hx   . Seizures Neg Hx   .  Sexual abuse Neg Hx   . Physical abuse Neg Hx   . Suicidality Neg Hx   . Colon cancer Neg Hx     Social History   Social History  . Marital status: Married    Spouse name: N/A  . Number of children: 0  . Years of education: N/A   Occupational History  . disability    Social History Main Topics  . Smoking status: Former Smoker    Packs/day: 0.25    Years: 33.00    Types: Cigarettes    Quit date: 01/21/2015  . Smokeless tobacco: Never Used     Comment: 9-10 cigs a day as of 10/20/2012, (02-07-15 per pt, she stopped smoking 01-21-15)  . Alcohol use No  . Drug use: Yes    Types: Marijuana     Comment: cocaine back in the 1980s; Marijuana rarely  . Sexual activity: Yes    Birth control/ protection: Surgical    Other Topics Concern  . Not on file   Social History Narrative  . No narrative on file    Review of Systems: See HPI, otherwise negative ROS   Physical Exam: BP 117/76   Pulse 77   Temp 98.2 F (36.8 C) (Oral)   SpO2 96%  General:   Alert,  pleasant and cooperative in NAD Head:  Normocephalic and atraumatic. Neck:  Supple; Lungs:  Clear throughout to auscultation.    Heart:  Regular rate and rhythm. Abdomen:  Soft, nontender and nondistended. Normal bowel sounds, without guarding, and without rebound.   Neurologic:  Alert and  oriented x4;  grossly normal neurologically.  Impression/Plan:     SCREENING FOR COLON CA AND VARICES  PLAN:  1.EGD/TCS TODAY DISCUSSED PROCEDURE, BENEFITS, & RISKS: < 1% chance of medication reaction, bleeding, perforation, or rupture of spleen/liver.\

## 2017-01-27 NOTE — Op Note (Signed)
[facility] Patient Name: Kathleen Palmer Procedure Date: 01/27/2017 12:27 PM MRN: [id number] Date of Birth: 1960-09-09 Attending MD: Barney Drain MD, MD CSN: [id number] Age: 56 Admit Type: Outpatient Procedure:                Upper GI endoscopy WITH COLD FORCEPS BIOPSY Indications:              1st degree variceal surveillance (known small                            varices, no prior bleeding) Providers:                Barney Drain MD, MD, Janeece Riggers, RN, Randa Spike, Technician Referring MD:             Modena Nunnery.  Medicines:                Propofol per Anesthesia Complications:            No immediate complications. Estimated Blood Loss:     Estimated blood loss was minimal. Procedure:                Pre-Anesthesia Assessment:                           - Prior to the procedure, a History and Physical                            was performed, and patient medications and                            allergies were reviewed. The patient's tolerance of                            previous anesthesia was also reviewed. The risks                            and benefits of the procedure and the sedation                            options and risks were discussed with the patient.                            All questions were answered, and informed consent                            was obtained. Prior Anticoagulants: The patient has                            taken no previous anticoagulant or antiplatelet                            agents. ASA Grade Assessment: II - A patient with  mild systemic disease. After reviewing the risks                            and benefits, the patient was deemed in                            satisfactory condition to undergo the procedure.                            After obtaining informed consent, the endoscope was                            passed under direct vision. Throughout the                   procedure, the patient's blood pressure, pulse, and                            oxygen saturations were monitored continuously. The                            [id number] ([id number]) scope was introduced through the                            mouth, and advanced to the second part of duodenum.                            The upper GI endoscopy was somewhat difficult due                            to excessive bleeding. Successful completion of the                            procedure was aided by controlling the bleeding.                            The patient tolerated the procedure well. Scope In: 12:32:06 PM Scope Out: 12:44:34 PM Total Procedure Duration: 0 hours 12 minutes 28 seconds  Findings:      The examined esophagus was normal.      Diffuse moderate inflammation characterized by congestion (edema),       erosions and erythema was found in the gastric body and in the gastric       antrum. Biopsies were taken with a cold forceps for Helicobacter pylori       testing. Coagulation for hemostasis of bleeding caused by the procedure       using bipolar probe was successful.      The examined duodenum was normal. Impression:               -                           - MODERATE Gastritis. Moderate Sedation:      Per Anesthesia Care Recommendation:           - High fiber diet.                           -  Continue present medications.                           - Await pathology results.                           - Return to my office in 6 months.                           - Repeat upper endoscopy 2-3 YEARS FOR VARICEAL                            SCREENING for surveillance.                           - Patient has a contact number available for                            emergencies. The signs and symptoms of potential                            delayed complications were discussed with the                            patient. Return to normal activities tomorrow.                             Written discharge instructions were provided to the                            patient. 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 All rights reserved. The codes documented in this report are preliminary and upon coder review may  be revised to meet current compliance requirements. Barney Drain, MD Barney Drain MD, MD 01/27/2017 12:52:27 PM This report has been signed electronically. Number of Addenda: 0

## 2017-01-27 NOTE — Transfer of Care (Signed)
Immediate Anesthesia Transfer of Care Note  Patient: Kathleen Palmer  Procedure(s) Performed: COLONOSCOPY WITH PROPOFOL (N/A ) ESOPHAGOGASTRODUODENOSCOPY (EGD) WITH PROPOFOL (N/A ) BIOPSY  Patient Location: PACU  Anesthesia Type:MAC  Level of Consciousness: awake and patient cooperative  Airway & Oxygen Therapy: Patient Spontanous Breathing and Patient connected to face mask oxygen  Post-op Assessment: Report given to RN, Post -op Vital signs reviewed and stable and Patient moving all extremities  Post vital signs: Reviewed and stable  Last Vitals:  Vitals:   01/27/17 1059  BP: 117/76  Pulse: 77  Temp: 36.8 C  SpO2: 96%    Last Pain:  Vitals:   01/27/17 1200  TempSrc:   PainSc: 8          Complications: No apparent anesthesia complications

## 2017-01-29 ENCOUNTER — Encounter (HOSPITAL_COMMUNITY): Payer: Self-pay | Admitting: Gastroenterology

## 2017-02-03 ENCOUNTER — Ambulatory Visit (INDEPENDENT_AMBULATORY_CARE_PROVIDER_SITE_OTHER): Payer: Medicare Other | Admitting: Licensed Clinical Social Worker

## 2017-02-03 DIAGNOSIS — F332 Major depressive disorder, recurrent severe without psychotic features: Secondary | ICD-10-CM | POA: Diagnosis not present

## 2017-02-03 NOTE — Progress Notes (Signed)
PT is aware. She had appt with [facility] for yesterday and had to reschedule to 03/26/2017.

## 2017-02-04 NOTE — Progress Notes (Signed)
   THERAPIST PROGRESS NOTE  Session Time: 11:00 am -11:45 am  Participation Level: Active  Behavioral Response: CasualAlertEuthymic  Type of Therapy: Individual Therapy  Treatment Goals addressed: Coping  Interventions: CBT and Solution Focused  Summary: Kathleen Palmer is a 56 y.o. female who presents oriented x4 (person, place, situation, and object), distracted, casually groomed, causually dressed and cooperative to address depression. Patient has a history of medical issues and mental health treatment. She denies symptoms of mania. Patient denies suicidal and homicidal ideations. She denies psychosis including auditory and visual hallucinations. Patient admits to cannabis use. She is at low risk for lethality. Patient has been hospitalized 7 times due to suicidal ideations but states she has never attempted due to her religious faith.   Patient had an average score of 5.75 on the Outcome Rating Scale. Patient was upset and reported that she has had an increase in anger and depression. Patient noted she has been off of opiates for a few months and is having to face "life" without self medicating. Patient shared her feelings about her husband and how he has an alcohol problem as well as acts verbally abusive toward her when he drinks. Patient reported that she is not going to let her husbands behavior impact her behavior and she is going to manage her mood despite all of her stressors (husband, her sister's health, etc). Patient rated the session 10 out of 10 on the Session Rating Scale.   Patient engaged in session. She responded well to interventions. Patient continues to meet criteria for Major Depressive disorder, recurrent episode, severe with anxious distress. Patient will continue in outpatient therapy due to being the least restrictive service to meet her needs. Patient made moderate progress on her goals at this time.   Suicidal/Homicidal: Negativewithout intent/plan  Therapist  Response: Therapist reviewed patient's recent thoughts and behaviors. Therapist utilized CBT to address depression. Therapist followed up on patient's homework. Therapist processed patient's feelings to identify triggers for mood. Therapist assisted patient in identifying ways to manage her mood. Therapist committed patient to manage her mood appropriate and not allow others "stuff" to impact her mood.  Therapist administered the Outcome Rating Scale and the Session Rating Scale.  Plan: Return again in 3 weeks.      Therapist will review patient goals on or before 09.27.2018  Diagnosis: Axis I: Major depressive disorder, recurrent episode, severe with anxious distress    Axis II: No diagnosis    [name], LCSW 02/03/2017

## 2017-02-11 ENCOUNTER — Telehealth: Payer: Self-pay

## 2017-02-11 NOTE — Telephone Encounter (Signed)
Please call pt. HER stomach Bx shows gastritis from ibuprofen. SHE HAD BENIGN POLYPS REMOVED FORM HER STOMACH.     DRINK WATER TO KEEP YOUR URINE LIGHT YELLOW.  FOLLOW A HIGH FIBER/LOW FAT DIET. AVOID ITEMS THAT CAUSE BLOATING.   CONTINUE OMEPRAZOLE.  TAKE 30 MINUTES PRIOR TO YOUR FIRST MEAL.  FOLLOW UP IN 4-6 MOS TO START HEP C TREATMENT.   Next colonoscopy in 10 years.

## 2017-02-11 NOTE — Telephone Encounter (Signed)
PT is calling for path report. Forwarding to Dr. Oneida Alar.

## 2017-02-11 NOTE — Telephone Encounter (Signed)
PT is aware.

## 2017-02-12 NOTE — Telephone Encounter (Signed)
Reminder in epic and OV made °

## 2017-02-19 ENCOUNTER — Ambulatory Visit (INDEPENDENT_AMBULATORY_CARE_PROVIDER_SITE_OTHER): Payer: Medicare Other | Admitting: Licensed Clinical Social Worker

## 2017-02-19 DIAGNOSIS — F332 Major depressive disorder, recurrent severe without psychotic features: Secondary | ICD-10-CM | POA: Diagnosis not present

## 2017-02-19 NOTE — Progress Notes (Signed)
   THERAPIST PROGRESS NOTE  Session Time: 2:00 pm -2:45 pm  Participation Level: Active  Behavioral Response: CasualAlertEuthymic  Type of Therapy: Individual Therapy  Treatment Goals addressed: Coping  Interventions: CBT and Solution Focused  Summary: Kathleen Palmer is a 56 y.o. female who presents oriented x4 (person, place, situation, and object), distracted, casually groomed, causually dressed and cooperative to address depression. Patient has a history of medical issues and mental health treatment. She denies symptoms of mania. Patient denies suicidal and homicidal ideations. She denies psychosis including auditory and visual hallucinations. Patient admits to cannabis use. She is at low risk for lethality. Patient has been hospitalized 7 times due to suicidal ideations but states she has never attempted due to her religious faith.   Patient had an average score of 4.75 on the Outcome Rating Scale which is a decrease of 1. Patient reports an increase in irritability, depression and racing thoughts. After discussion, patient agreed to start taking medication as prescribed and may try to move her appointment for medication up a few weeks. Patient admitted to constant thoughts of suicide, thoughts of drinking alcohol and thoughts of opioid use. After discussion, patient understood that she is strong and has been able to resist giving into her thoughts and urges. Patient denied a plan or means of suicide but has passive thoughts of suicide. Patient committed to take her medication as prescribed and do the small things (activities of daily living, chores, etc) to combat depression. Patient rated the session 10 out of 10 on the Session Rating Scale.   Patient engaged in session. She responded well to interventions. Patient continues to meet criteria for Major Depressive disorder, recurrent episode, severe with anxious distress. Patient will continue in outpatient therapy due to being the least  restrictive service to meet her needs. Patient made moderate progress on her goals at this time.   Suicidal/Homicidal: Negativewithout intent/plan  Therapist Response: Therapist reviewed patient's recent thoughts and behaviors. Therapist utilized CBT to address depression. Therapist followed up on patient's homework. Therapist processed patient's feelings to identify triggers for mood. Therapist discussed the importance of taking her medication as prescribed and encouraged patient to talk to psychiatrist about medication. Therapist explained the importance of activities of daily living. Therapist committed patient to take medication as prescribed and do the small things to combat depression.  Therapist administered the Outcome Rating Scale and the Session Rating Scale.  Plan: Return again in 3 weeks.      Therapist will review patient goals on or before 09.27.2018  Diagnosis: Axis I: Major depressive disorder, recurrent episode, severe with anxious distress    Axis II: No diagnosis    [name], LCSW 02/03/2017

## 2017-03-05 ENCOUNTER — Ambulatory Visit (HOSPITAL_COMMUNITY): Payer: Self-pay | Admitting: Licensed Clinical Social Worker

## 2017-03-10 ENCOUNTER — Ambulatory Visit (INDEPENDENT_AMBULATORY_CARE_PROVIDER_SITE_OTHER): Payer: Medicare Other | Admitting: Licensed Clinical Social Worker

## 2017-03-10 ENCOUNTER — Encounter (HOSPITAL_COMMUNITY): Payer: Self-pay | Admitting: Licensed Clinical Social Worker

## 2017-03-10 DIAGNOSIS — F332 Major depressive disorder, recurrent severe without psychotic features: Secondary | ICD-10-CM | POA: Diagnosis not present

## 2017-03-10 NOTE — Progress Notes (Signed)
   THERAPIST PROGRESS NOTE  Session Time: 3:00 pm -3:45 pm  Participation Level: Active  Behavioral Response: CasualAlertEuthymic  Type of Therapy: Individual Therapy  Treatment Goals addressed: Coping  Interventions: CBT and Solution Focused  Summary: Kathleen Palmer is a 56 y.o. female who presents oriented x4 (person, place, situation, and object), distracted, casually groomed, causually dressed and cooperative to address depression. Patient has a history of medical issues and mental health treatment. She denies symptoms of mania. Patient denies suicidal and homicidal ideations. She denies psychosis including auditory and visual hallucinations. Patient admits to cannabis use. She is at low risk for lethality. Patient has been hospitalized 7 times due to suicidal ideations but states she has never attempted due to her religious faith.   Patient had an average score of 3 on the Outcome Rating Scale which is a decrease of 1.75. Patient noted that she continues to cry a lot. She is struggling with how to interact with her sister without taking on her feelings. After discussion, patient understood that she needs to set boundaries with her sister and she doesn't have to be a cheerleader for her sister, she is not responsible for her sister's happiness and she doesn't have to allow her sister to be mean to her due to her sister's illness. Patient also understood that she needs to set boundaries with her emotions including allowing herself to feel sadness, anger or anxiety but not allow it to consume her. Patient is going to start back on her anti depressants. She recognizes that she needs medication as a support for her. Patient rated the session 9.75 out of 10 on the Session Rating Scale.   Patient engaged in session. She responded well to interventions. Patient continues to meet criteria for Major Depressive disorder, recurrent episode, severe with anxious distress. Patient will continue in outpatient  therapy due to being the least restrictive service to meet her needs. Patient made moderate progress on her goals at this time.   Suicidal/Homicidal: Negativewithout intent/plan  Therapist Response: Therapist reviewed patient's recent thoughts and behaviors. Therapist utilized CBT to address depression. Therapist followed up on patient's homework. Therapist processed patient's feelings to identify triggers for mood. Therapist discussed setting boundaries with her sister. Therapist assisted patient in identifying how she can experience her feelings but not allow them to control her. Therapist committed patient to set boundaries with her sister and to not allow her feelings to control her. Therapist administered the Outcome Rating Scale and the Session Rating Scale.  Plan: Return again in 3 weeks.      Therapist will review patient goals on or before 09.27.2018  Diagnosis: Axis I: Major depressive disorder, recurrent episode, severe with anxious distress    Axis II: No diagnosis    [name], LCSW 02/03/2017

## 2017-03-11 ENCOUNTER — Encounter (HOSPITAL_COMMUNITY): Payer: Self-pay | Admitting: Psychiatry

## 2017-03-11 ENCOUNTER — Ambulatory Visit (INDEPENDENT_AMBULATORY_CARE_PROVIDER_SITE_OTHER): Payer: Medicare Other | Admitting: Psychiatry

## 2017-03-11 VITALS — BP 124/74 | HR 76 | Ht 63.0 in | Wt 164.0 lb

## 2017-03-11 DIAGNOSIS — F419 Anxiety disorder, unspecified: Secondary | ICD-10-CM | POA: Diagnosis not present

## 2017-03-11 DIAGNOSIS — Z818 Family history of other mental and behavioral disorders: Secondary | ICD-10-CM

## 2017-03-11 DIAGNOSIS — F332 Major depressive disorder, recurrent severe without psychotic features: Secondary | ICD-10-CM

## 2017-03-11 DIAGNOSIS — F4310 Post-traumatic stress disorder, unspecified: Secondary | ICD-10-CM | POA: Diagnosis not present

## 2017-03-11 DIAGNOSIS — Z87891 Personal history of nicotine dependence: Secondary | ICD-10-CM | POA: Diagnosis not present

## 2017-03-11 DIAGNOSIS — F603 Borderline personality disorder: Secondary | ICD-10-CM | POA: Diagnosis not present

## 2017-03-11 DIAGNOSIS — Z79899 Other long term (current) drug therapy: Secondary | ICD-10-CM | POA: Diagnosis not present

## 2017-03-11 MED ORDER — FLUOXETINE HCL 20 MG PO CAPS: 20.0000 mg | ORAL_CAPSULE | Freq: Every day | ORAL | 2 refills | Status: DC

## 2017-03-11 MED ORDER — TRAZODONE HCL 100 MG PO TABS: 200.0000 mg | ORAL_TABLET | Freq: Every evening | ORAL | 2 refills | Status: DC | PRN

## 2017-03-11 MED ORDER — ALPRAZOLAM 0.5 MG PO TABS: 0.5000 mg | ORAL_TABLET | Freq: Four times a day (QID) | ORAL | 2 refills | Status: DC

## 2017-03-11 MED ORDER — HYDROXYZINE HCL 25 MG PO TABS: 25.0000 mg | ORAL_TABLET | Freq: Four times a day (QID) | ORAL | 2 refills | Status: DC | PRN

## 2017-03-11 NOTE — Progress Notes (Signed)
Kathleen Palmer/PA/NP OP Progress Note  03/11/2017 12:05 PM Kathleen Palmer  MRN:  [id number]  Chief Complaint:  Chief Complaint    Depression; Anxiety; Follow-up     HPI: Patient is a 56 year old white female who lives with her husband.  She is currently on disability.  The patient returns after 6 weeks.  Last time she stated that she wanted to stay off antidepressants.  However this time she feels like her depression is coming back.  She has been having crying spells confusion and inability to make decisions.  She denies suicidal ideation.  She continues to take medication for anxiety and sleep and she is sleeping pretty well.  I have tried numerous antidepressants and mood stabilizers and the only thing she seems to be able to tolerate his Prozac.  She is agreeable to a retrial of a low-dose of Prozac.  She continues with her counseling here Visit Diagnosis:    ICD-10-CM   1. Major depressive disorder, recurrent episode, severe with anxious distress ([place]) F33.2   2. PTSD (post-traumatic stress disorder) F43.10     Past Psychiatric History: Long-term therapy for depression.  She was hospitalized last year  Past Medical History:  Past Medical History:  Diagnosis Date  . Angiomyolipoma    Left kidney  . Anxiety   . Arthritis   . Attention to urostomy [facility])   . Chronic pain   . Depression   . Hepatitis C    ? Contracted through IVDA  . Panic 04/29/1999  . PTSD (post-traumatic stress disorder)   . Substance abuse ([place])    Remote history- cocaine, ETOH, Marijuana  . Thyroid disease     Past Surgical History:  Procedure Laterality Date  . ABDOMINAL HYSTERECTOMY    . ABDOMINAL SURGERY    . CHOLECYSTECTOMY    . HERNIA REPAIR     x2- abdomen  . ILEO LOOP CONDUIT    . 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 VAGINA RECONSTRUCTION SURGERY      Family Psychiatric History:  See below  Family History:  Family History  Adopted: Yes  Problem Relation Age of Onset  . Depression Mother   . Depression Sister   . Anxiety disorder Sister   . Bipolar disorder Sister   . Depression Maternal Grandmother   . ADD / ADHD Neg Hx   . Alcohol abuse Neg Hx   . Drug abuse Neg Hx   . Dementia Neg Hx   . OCD Neg Hx   . Paranoid behavior Neg Hx   . Schizophrenia Neg Hx   . Seizures Neg Hx   . Sexual abuse Neg Hx   . Physical abuse Neg Hx   . Suicidality Neg Hx   . Colon cancer Neg Hx     Social History:  Social History   Socioeconomic History  . Marital status: Married    Spouse name: None  . Number of children: 0  . Years of education: None  . Highest education level: None  Social Needs  . Financial resource strain: None  . Food insecurity - worry: None  . Food insecurity - inability: None  . Transportation needs - medical: None  . Transportation needs - non-medical: None  Occupational History  . Occupation: disability  Tobacco Use  . Smoking status: Former Smoker    Packs/day: 0.25    Years: 33.00  Pack years: 8.25    Types: Cigarettes    Last attempt to quit: 01/21/2015    Years since quitting: 2.1  . Smokeless tobacco: Never Used  . Tobacco comment: 9-10 cigs a day as of 10/20/2012, (02-07-15 per pt, she stopped smoking 01-21-15)  Substance and Sexual Activity  . Alcohol use: No    Alcohol/week: 0.0 oz  . Drug use: Yes    Types: Marijuana    Comment: cocaine back in the 1980s; Marijuana rarely  . Sexual activity: Yes    Birth control/protection: Surgical  Other Topics Concern  . None  Social History Narrative  . None    Allergies:  Allergies  Allergen Reactions  . Oxycodone Anxiety  . Abilify [Aripiprazole]     Stiff neck  . Bactrim [Sulfamethoxazole-Trimethoprim]     Tongue swelled  . Gabapentin     Dizziness to the point she actually fell  . Ampicillin Rash    Has patient had a PCN reaction causing immediate rash,  facial/tongue/throat swelling, SOB or lightheadedness with hypotension: Unknown Has patient had a PCN reaction causing severe rash involving mucus membranes or skin necrosis: Unknown Has patient had a PCN reaction that required hospitalization: No Has patient had a PCN reaction occurring within the last 10 years: No If all of the above answers are "NO", then may proceed with Cephalosporin use.      Metabolic Disorder Labs: Lab Results  Component Value Date   HGBA1C 5.2 12/05/2016   MPG 102.54 12/05/2016   MPG 117 (H) 10/23/2014   No results found for: PROLACTIN Lab Results  Component Value Date   CHOL 129 12/05/2016   TRIG 69 12/05/2016   HDL 45 12/05/2016   CHOLHDL 2.9 12/05/2016   VLDL 14 12/05/2016   LDLCALC 70 12/05/2016   LDLCALC 107 (H) 10/23/2014   Lab Results  Component Value Date   TSH 6.90 (H) 01/19/2017   TSH 9.591 (H) 12/05/2016    Therapeutic Level Labs: No results found for: LITHIUM No results found for: VALPROATE No components found for:  CBMZ  Current Medications: Current Outpatient Medications  Medication Sig Dispense Refill  . ALPRAZolam (XANAX) 0.5 MG tablet Take 1 tablet (0.5 mg total) 4 (four) times daily by mouth. 120 tablet 2  . Cyanocobalamin (B-12) 500 MCG TABS Take 1 tablet by mouth daily.    . hydrOXYzine (ATARAX/VISTARIL) 25 MG tablet Take 1 tablet (25 mg total) every 6 (six) hours as needed by mouth for anxiety. 90 tablet 2  . ibuprofen (ADVIL,MOTRIN) 600 MG tablet Take 600 mg by mouth every 6 (six) hours as needed for mild pain.     Marland Kitchen linaclotide (LINZESS) 72 MCG capsule Take 1 capsule (72 mcg total) by mouth daily before breakfast. 90 capsule 3  . Multiple Vitamins-Minerals (CENTRUM SILVER PO) Take 1 tablet by mouth daily.    Marland Kitchen neomycin-bacitracin-polymyxin (NEOSPORIN) ointment Apply 1 application topically as needed for wound care. apply to eye    . omeprazole (PRILOSEC) 20 MG capsule 1 PO 30 MINS PRIOR TO BREAKFAST. 90 capsule 3  .  tetrahydrozoline 0.05 % ophthalmic solution Place 1 drop into both eyes daily.    . traZODone (DESYREL) 100 MG tablet Take 2 tablets (200 mg total) at bedtime as needed by mouth for sleep. 60 tablet 2  . FLUoxetine (PROZAC) 20 MG capsule Take 1 capsule (20 mg total) daily by mouth. 30 capsule 2   No current facility-administered medications for this visit.      Musculoskeletal:  Strength & Muscle Tone: Normal Gait & Station: unsteady Patient leans: N/A  Psychiatric Specialty Exam: Review of Systems  Constitutional: Positive for malaise/fatigue.  Psychiatric/Behavioral: Positive for depression.  All other systems reviewed and are negative.   Blood pressure 124/74, pulse 76, height 5\' 3"  (1.6 m), weight 164 lb (74.4 kg), SpO2 96 %.Body mass index is 29.05 kg/m.  General Appearance: Casual and Fairly Groomed  Eye Contact:  Good  Speech:  Clear and Coherent  Volume:  Normal  Mood:  Depressed  Affect:  Depressed and Tearful  Thought Process:  Goal Directed  Orientation:  Full (Time, Place, and Person)  Thought Content: Rumination   Suicidal Thoughts:  No  Homicidal Thoughts:  No  Memory:  Immediate;   Good Recent;   Good Remote;   Fair  Judgement:  Fair  Insight:  Fair  Psychomotor Activity:  Normal  Concentration:  Concentration: Fair and Attention Span: Fair  Recall:  Good  Fund of Knowledge: Good  Language: Good  Akathisia:  No  Handed:  Right  AIMS (if indicated): not done  Assets:  Communication Skills Desire for Improvement Resilience  ADL's:  Intact  Cognition: WNL  Sleep:  Good   Screenings: AIMS     Admission (Discharged) from 12/03/2016 in [place] [facility]  AIMS Total Score  0    AUDIT     Admission (Discharged) from 12/03/2016 in [place] [facility]  Alcohol Use Disorder Identification Test Final Score (AUDIT)  0    MDI     Office Visit from 11/15/2015 in [place]  [facility]  Total Score (max 50)  24    PHQ2-9     Office Visit from 01/19/2017 in [place] Office Visit from 12/10/2016 in [place] Office Visit from 10/17/2016 in [place] Office Visit from 03/07/2014 in [place]  PHQ-2 Total Score  4  4  2  4   PHQ-9 Total Score  17  15  8  14        Assessment and Plan: Patient is a 56 year old female with a long-term history of depression and anxiety as well as borderline personality disorder.  She states her depression is worsening so we will restart Prozac 20 mg daily.  She will continue trazodone 200 mg at bedtime for sleep, Xanax 0.5 mg 4 times a day for anxiety and hydroxyzine 25 mg every 6 hours as needed for anxiety.  She will return to see me in 2 months and will also continue her counseling   Levonne Spiller, Palmer 03/11/2017, 12:05 PM

## 2017-03-18 ENCOUNTER — Ambulatory Visit (HOSPITAL_COMMUNITY): Payer: Self-pay | Admitting: Psychiatry

## 2017-03-23 DIAGNOSIS — K7469 Other cirrhosis of liver: Secondary | ICD-10-CM | POA: Diagnosis not present

## 2017-03-23 DIAGNOSIS — B182 Chronic viral hepatitis C: Secondary | ICD-10-CM | POA: Diagnosis not present

## 2017-03-26 DIAGNOSIS — B182 Chronic viral hepatitis C: Secondary | ICD-10-CM | POA: Diagnosis not present

## 2017-03-26 DIAGNOSIS — K7469 Other cirrhosis of liver: Secondary | ICD-10-CM | POA: Diagnosis not present

## 2017-03-30 ENCOUNTER — Other Ambulatory Visit: Payer: Self-pay | Admitting: Family Medicine

## 2017-04-03 ENCOUNTER — Encounter (HOSPITAL_COMMUNITY): Payer: Self-pay | Admitting: Licensed Clinical Social Worker

## 2017-04-03 ENCOUNTER — Ambulatory Visit (INDEPENDENT_AMBULATORY_CARE_PROVIDER_SITE_OTHER): Payer: Medicare Other | Admitting: Licensed Clinical Social Worker

## 2017-04-03 DIAGNOSIS — F332 Major depressive disorder, recurrent severe without psychotic features: Secondary | ICD-10-CM | POA: Diagnosis not present

## 2017-04-03 NOTE — Progress Notes (Signed)
   THERAPIST PROGRESS NOTE  Session Time: 10:00 am -10:45 pm  Participation Level: Active  Behavioral Response: CasualAlertEuthymic  Type of Therapy: Individual Therapy  Treatment Goals addressed: Coping  Interventions: CBT and Solution Focused  Summary: Kathleen Palmer is a 56 y.o. female who presents oriented x4 (person, place, situation, and object), distracted, casually groomed, causually dressed and cooperative to address depression. Patient has a history of medical issues and mental health treatment. She denies symptoms of mania. Patient denies suicidal and homicidal ideations. She denies psychosis including auditory and visual hallucinations. Patient admits to cannabis use. She is at low risk for lethality. Patient has been hospitalized 7 times due to suicidal ideations but states she has never attempted due to her religious faith.   Patient had an average score of 6.5 on the Outcome Rating Scale which is a increase of 3.5. Patient reported that she continues to cry but she is trying to limit herself to crying one hour in the morning. Patient reported that she had been crying all day so she is satisfied that she is crying only an hour a day. After discussion, patient understood that she needs to start reducing the amount of time she is crying by about 5 minutes a week or so. Patient noted that she is trying to stop crying and be strong for her sister. She is going to see her sister and will be gone for a month. She wants to avoid crying in front of her sister because it will cause her sister to cry and her sister is in bad health. Patient is learning to accept her physical pain and her sister's health which is helping her mood. Patient rated the session 10 out of 10 on the Session Rating Scale.   Patient engaged in session. She responded well to interventions. Patient continues to meet criteria for Major Depressive disorder, recurrent episode, severe with anxious distress. Patient will  continue in outpatient therapy due to being the least restrictive service to meet her needs. Patient made moderate progress on her goals at this time.   Suicidal/Homicidal: Negativewithout intent/plan  Therapist Response: Therapist reviewed patient's recent thoughts and behaviors. Therapist utilized CBT to address depression. Therapist followed up on patient's homework. Therapist processed patient's feelings to identify triggers for mood. Therapist discussed managing her mood/crying and how she can reduce it. Therapist discussed acceptance with patient. Therapist administered the Outcome Rating Scale and the Session Rating Scale.  Plan: Return again in 4 weeks.      Therapist will review patient goals on or before 12.27.2018  Diagnosis: Axis I: Major depressive disorder, recurrent episode, severe with anxious distress    Axis II: No diagnosis    [name], LCSW 02/03/2017

## 2017-04-06 ENCOUNTER — Ambulatory Visit: Payer: Medicare Other | Admitting: Nurse Practitioner

## 2017-05-04 ENCOUNTER — Other Ambulatory Visit (HOSPITAL_COMMUNITY): Payer: Self-pay | Admitting: Psychiatry

## 2017-05-04 ENCOUNTER — Telehealth (HOSPITAL_COMMUNITY): Payer: Self-pay | Admitting: *Deleted

## 2017-05-04 MED ORDER — ALPRAZOLAM 0.5 MG PO TABS: 0.5000 mg | ORAL_TABLET | Freq: Four times a day (QID) | ORAL | 0 refills | Status: DC

## 2017-05-04 NOTE — Telephone Encounter (Signed)
I would have to know the exact name and address of the pharmcay

## 2017-05-04 NOTE — Telephone Encounter (Signed)
Dr Harrington Challenger Patient called stating that she is visiting her sister in [place] & forgot her medication. [facility] [place] [facility] Al[facility]razolam with a faxed script

## 2017-05-04 NOTE — Telephone Encounter (Signed)
Dr Harrington Challenger  Will you be sending a e-script to the [facility]?

## 2017-05-04 NOTE — Telephone Encounter (Signed)
It would probably be easiest to call in a one month supply

## 2017-05-04 NOTE — Telephone Encounter (Signed)
Patient has been notified

## 2017-05-04 NOTE — Telephone Encounter (Signed)
sent 

## 2017-05-04 NOTE — Telephone Encounter (Signed)
Sending phone call message

## 2017-05-04 NOTE — Telephone Encounter (Signed)
[id number] [place]. [phone] (Phone) [phone] (Fax)

## 2017-05-11 ENCOUNTER — Ambulatory Visit (HOSPITAL_COMMUNITY): Payer: Self-pay | Admitting: Psychiatry

## 2017-05-13 ENCOUNTER — Ambulatory Visit (INDEPENDENT_AMBULATORY_CARE_PROVIDER_SITE_OTHER): Payer: Medicare Other | Admitting: Psychiatry

## 2017-05-13 ENCOUNTER — Encounter (HOSPITAL_COMMUNITY): Payer: Self-pay | Admitting: Psychiatry

## 2017-05-13 VITALS — BP 119/78 | HR 70 | Ht 63.0 in | Wt 169.0 lb

## 2017-05-13 DIAGNOSIS — F332 Major depressive disorder, recurrent severe without psychotic features: Secondary | ICD-10-CM

## 2017-05-13 DIAGNOSIS — Z560 Unemployment, unspecified: Secondary | ICD-10-CM

## 2017-05-13 DIAGNOSIS — Z818 Family history of other mental and behavioral disorders: Secondary | ICD-10-CM | POA: Diagnosis not present

## 2017-05-13 DIAGNOSIS — F1290 Cannabis use, unspecified, uncomplicated: Secondary | ICD-10-CM

## 2017-05-13 DIAGNOSIS — M2550 Pain in unspecified joint: Secondary | ICD-10-CM | POA: Diagnosis not present

## 2017-05-13 DIAGNOSIS — R450 Nervousness: Secondary | ICD-10-CM

## 2017-05-13 DIAGNOSIS — F419 Anxiety disorder, unspecified: Secondary | ICD-10-CM

## 2017-05-13 DIAGNOSIS — R109 Unspecified abdominal pain: Secondary | ICD-10-CM

## 2017-05-13 DIAGNOSIS — Z87891 Personal history of nicotine dependence: Secondary | ICD-10-CM | POA: Diagnosis not present

## 2017-05-13 MED ORDER — DULOXETINE HCL 60 MG PO CPEP: 60.0000 mg | ORAL_CAPSULE | Freq: Every day | ORAL | 2 refills | Status: DC

## 2017-05-13 MED ORDER — TRAZODONE HCL 100 MG PO TABS: 200.0000 mg | ORAL_TABLET | Freq: Every evening | ORAL | 2 refills | Status: DC | PRN

## 2017-05-13 MED ORDER — HYDROXYZINE HCL 25 MG PO TABS: 25.0000 mg | ORAL_TABLET | Freq: Four times a day (QID) | ORAL | 2 refills | Status: DC | PRN

## 2017-05-13 MED ORDER — ALPRAZOLAM 0.5 MG PO TABS: 0.5000 mg | ORAL_TABLET | Freq: Four times a day (QID) | ORAL | 2 refills | Status: DC

## 2017-05-13 NOTE — Progress Notes (Signed)
[place] MD/PA/NP OP Progress Note  05/13/2017 11:48 AM Kathleen Palmer  MRN:  [id number]  Chief Complaint:  Chief Complaint    Depression; Anxiety; Follow-up     HPI: Patient is a 57 year old white female who lives with her husband.  She is currently on disability.  The patient returns after 2 months.  Last time she stated she was more depressed and we restarted Prozac.  However she states after 4 weeks she stopped it because it was not helping.  She went on a trip to [place] to visit her sister who is suffering from [place].  She states that since she has been back she has been increasingly depressed.  She wakes up confused and very anxious and through the day it eventually gets a little bit better but she still feels sad and has crying spells.  She would like to try another antidepressant.  She does have a lot of chronic pain from numerous surgeries so I suggested we try Cymbalta.  She denies being suicidal.  She is anxious but she has both Xanax and hydroxyzine to take for this.  She is sleeping well with the trazodone. Visit Diagnosis:    ICD-10-CM   1. Major depressive disorder, recurrent episode, severe with anxious distress ([place]) F33.2     Past Psychiatric History: Several previous hospitalizations for depression and long-term outpatient treatment  Past Medical History:  Past Medical History:  Diagnosis Date  . Angiomyolipoma    Left kidney  . Anxiety   . Arthritis   . Attention to urostomy [facility])   . Chronic pain   . Depression   . Hepatitis C    ? Contracted through IVDA  . Panic 04/29/1999  . PTSD (post-traumatic stress disorder)   . Substance abuse ([place])    Remote history- cocaine, ETOH, Marijuana  . Thyroid disease     Past Surgical History:  Procedure Laterality Date  . ABDOMINAL HYSTERECTOMY    . ABDOMINAL SURGERY    . BIOPSY  01/27/2017   Procedure: BIOPSY;  Surgeon: Danie Binder, MD;  [facility];  Service: Endoscopy;;  gastric  . CHOLECYSTECTOMY    .  COLONOSCOPY WITH PROPOFOL N/A 01/27/2017   Procedure: COLONOSCOPY WITH PROPOFOL;  Surgeon: Danie Binder, MD;  [facility];  Service: Endoscopy;  Laterality: N/A;  12:00pm  . ESOPHAGOGASTRODUODENOSCOPY (EGD) WITH PROPOFOL N/A 01/27/2017   Procedure: ESOPHAGOGASTRODUODENOSCOPY (EGD) WITH PROPOFOL;  Surgeon: Danie Binder, MD;  [facility];  Service: Endoscopy;  Laterality: N/A;  . HERNIA REPAIR     x2- abdomen  . ILEO LOOP CONDUIT    . 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 VAGINA RECONSTRUCTION SURGERY      Family Psychiatric History: See below  Family History:  Family History  Adopted: Yes  Problem Relation Age of Onset  . Depression Mother   . Depression Sister   . Anxiety disorder Sister   . Bipolar disorder Sister   . Depression Maternal Grandmother   . ADD / ADHD Neg Hx   . Alcohol abuse Neg Hx   . Drug abuse Neg Hx   . Dementia Neg Hx   . OCD Neg Hx   . Paranoid behavior Neg Hx   . Schizophrenia Neg Hx   . Seizures Neg Hx   . Sexual abuse Neg Hx   .  Physical abuse Neg Hx   . Suicidality Neg Hx   . Colon cancer Neg Hx     Social History:  Social History   Socioeconomic History  . Marital status: Married    Spouse name: None  . Number of children: 0  . Years of education: None  . Highest education level: None  Social Needs  . Financial resource strain: None  . Food insecurity - worry: None  . Food insecurity - inability: None  . Transportation needs - medical: None  . Transportation needs - non-medical: None  Occupational History  . Occupation: disability  Tobacco Use  . Smoking status: Former Smoker    Packs/day: 0.25    Years: 33.00    Pack years: 8.25    Types: Cigarettes    Last attempt to quit: 01/21/2015    Years since quitting: 2.3  . Smokeless tobacco: Never Used  . Tobacco comment: 9-10 cigs a day as  of 10/20/2012, (02-07-15 per pt, she stopped smoking 01-21-15)  Substance and Sexual Activity  . Alcohol use: No    Alcohol/week: 0.0 oz  . Drug use: Yes    Types: Marijuana    Comment: cocaine back in the 1980s; Marijuana rarely  . Sexual activity: Yes    Birth control/protection: Surgical  Other Topics Concern  . None  Social History Narrative  . None    Allergies:  Allergies  Allergen Reactions  . Oxycodone Anxiety  . Abilify [Aripiprazole]     Stiff neck  . Bactrim [Sulfamethoxazole-Trimethoprim]     Tongue swelled  . Gabapentin     Dizziness to the point she actually fell  . Ampicillin Rash    Has patient had a PCN reaction causing immediate rash, facial/tongue/throat swelling, SOB or lightheadedness with hypotension: Unknown Has patient had a PCN reaction causing severe rash involving mucus membranes or skin necrosis: Unknown Has patient had a PCN reaction that required hospitalization: No Has patient had a PCN reaction occurring within the last 10 years: No If all of the above answers are "NO", then may proceed with Cephalosporin use.      Metabolic Disorder Labs: Lab Results  Component Value Date   HGBA1C 5.2 12/05/2016   MPG 102.54 12/05/2016   MPG 117 (H) 10/23/2014   No results found for: PROLACTIN Lab Results  Component Value Date   CHOL 129 12/05/2016   TRIG 69 12/05/2016   HDL 45 12/05/2016   CHOLHDL 2.9 12/05/2016   VLDL 14 12/05/2016   LDLCALC 70 12/05/2016   LDLCALC 107 (H) 10/23/2014   Lab Results  Component Value Date   TSH 6.90 (H) 01/19/2017   TSH 9.591 (H) 12/05/2016    Therapeutic Level Labs: No results found for: LITHIUM No results found for: VALPROATE No components found for:  CBMZ  Current Medications: Current Outpatient Medications  Medication Sig Dispense Refill  . ALPRAZolam (XANAX) 0.5 MG tablet Take 1 tablet (0.5 mg total) by mouth 4 (four) times daily. 120 tablet 2  . Cyanocobalamin (B-12) 500 MCG TABS Take 1 tablet by  mouth daily.    Marland Kitchen FLUoxetine (PROZAC) 20 MG capsule Take 1 capsule (20 mg total) daily by mouth. 30 capsule 2  . hydrOXYzine (ATARAX/VISTARIL) 25 MG tablet Take 1 tablet (25 mg total) by mouth every 6 (six) hours as needed for anxiety. 90 tablet 2  . ibuprofen (ADVIL,MOTRIN) 600 MG tablet Take 600 mg by mouth every 6 (six) hours as needed for mild pain.     Marland Kitchen  ibuprofen (ADVIL,MOTRIN) 600 MG tablet TAKE 1 TABLET (600 MG TOTAL) BY MOUTH 2 (TWO) TIMES DAILY. 60 tablet 2  . linaclotide (LINZESS) 72 MCG capsule Take 1 capsule (72 mcg total) by mouth daily before breakfast. 90 capsule 3  . Multiple Vitamins-Minerals (CENTRUM SILVER PO) Take 1 tablet by mouth daily.    Marland Kitchen neomycin-bacitracin-polymyxin (NEOSPORIN) ointment Apply 1 application topically as needed for wound care. apply to eye    . omeprazole (PRILOSEC) 20 MG capsule 1 PO 30 MINS PRIOR TO BREAKFAST. 90 capsule 3  . tetrahydrozoline 0.05 % ophthalmic solution Place 1 drop into both eyes daily.    . traZODone (DESYREL) 100 MG tablet Take 2 tablets (200 mg total) by mouth at bedtime as needed for sleep. 60 tablet 2  . DULoxetine (CYMBALTA) 60 MG capsule Take 1 capsule (60 mg total) by mouth daily. 30 capsule 2   No current facility-administered medications for this visit.      Musculoskeletal: Strength & Muscle Tone: decreased Gait & Station: normal Patient leans: N/A  Psychiatric Specialty Exam: Review of Systems  Gastrointestinal: Positive for abdominal pain.  Musculoskeletal: Positive for joint pain.  Psychiatric/Behavioral: Positive for depression. The patient is nervous/anxious.   All other systems reviewed and are negative.   Blood pressure 119/78, pulse 70, height 5\' 3"  (1.6 m), weight 169 lb (76.7 kg), SpO2 95 %.Body mass index is 29.94 kg/m.  General Appearance: Casual  Eye Contact:  Good  Speech:  Clear and Coherent  Volume:  Normal  Mood:  Anxious and Dysphoric  Affect:  Depressed and Tearful  Thought Process:  Goal  Directed  Orientation:  Full (Time, Place, and Person)  Thought Content: Rumination   Suicidal Thoughts:  No  Homicidal Thoughts:  No  Memory:  Immediate;   Good Recent;   Fair Remote;   Fair  Judgement:  Poor  Insight:  Lacking  Psychomotor Activity:  Normal  Concentration:  Concentration: Poor and Attention Span: Poor  Recall:  [place] of Knowledge: Good  Language: Good  Akathisia:  No  Handed:  Right  AIMS (if indicated): not done  Assets:  Communication Skills Desire for Improvement Resilience Social Support Talents/Skills  ADL's:  Intact  Cognition: WNL  Sleep:  Good   Screenings: AIMS     Admission (Discharged) from 12/03/2016 in [facility]  AIMS Total Score  0    AUDIT     Admission (Discharged) from 12/03/2016 in [facility]  Alcohol Use Disorder Identification Test Final Score (AUDIT)  0    MDI     Office Visit from 11/15/2015 in [facility]  Total Score (max 50)  24    PHQ2-9     Office Visit from 01/19/2017 in [place] Office Visit from 12/10/2016 in [place] Office Visit from 10/17/2016 in [facility] Office Visit from 03/07/2014 in [facility]  PHQ-2 Total Score  4  4  2  4   PHQ-9 Total Score  17  15  8  14        Assessment and Plan: This patient is a 57 year old female with a history of depression and anxiety.  She will start Cymbalta 60 mg daily to help with depressed mood.  She will continue Xanax 0.5 mg 3 times a day for anxiety and hydroxyzine 25 mg up to 3 times a day for anxiety as well as trazodone 150  mg at bedtime for sleep.  She will continue her counseling and return to see me in 4 weeks   Levonne Spiller, MD 05/13/2017, 11:48 AM

## 2017-05-20 ENCOUNTER — Encounter (HOSPITAL_COMMUNITY): Payer: Self-pay | Admitting: Licensed Clinical Social Worker

## 2017-05-20 ENCOUNTER — Ambulatory Visit (INDEPENDENT_AMBULATORY_CARE_PROVIDER_SITE_OTHER): Payer: Medicare Other | Admitting: Licensed Clinical Social Worker

## 2017-05-20 DIAGNOSIS — F332 Major depressive disorder, recurrent severe without psychotic features: Secondary | ICD-10-CM

## 2017-05-20 NOTE — Progress Notes (Signed)
   THERAPIST PROGRESS NOTE  Session Time: 11:00 am -11:45 am  Participation Level: Active  Behavioral Response: CasualAlertEuthymic  Type of Therapy: Individual Therapy  Treatment Goals addressed: Coping  Interventions: CBT and Solution Focused  Summary: Kathleen Palmer is a 57 y.o. female who presents oriented x4 (person, place, situation, and object), distracted, casually groomed, causually dressed and cooperative to address depression. Patient has a history of medical issues and mental health treatment. She denies symptoms of mania. Patient denies suicidal and homicidal ideations. She denies psychosis including auditory and visual hallucinations. Patient admits to cannabis use. She is at low risk for lethality. Patient has been hospitalized 7 times due to suicidal ideations but states she has never attempted due to her religious faith.   Physical: Patient reports chronic pain. Patient is experiencing hip pain.  Spiritual/value: No issues identified.  Relationships: Patient reports that her relationships with others are going well. She got to see her sister in [place] and stayed there a month. She reported that her husband is drinking less and she is speaking up more to her husband if he starts to get mean.  Emotional/Mental/Behavior: Patient is crying less. She is doing well with her new medication. Patient reported that she was exposed to a situation where others were abusing opiates but she resisted. She notes it was difficult and caused her stomach to hurt but she didn't give in. Patient reported that she has more energy and is cleaning the house more. She did note that she over did it and was in bed due to pain the following day.   Patient engaged in session. She responded well to interventions. Patient continues to meet criteria for Major Depressive disorder, recurrent episode, severe with anxious distress. Patient will continue in outpatient therapy due to being the least restrictive  service to meet her needs. Patient made moderate progress on her goals at this time.   Suicidal/Homicidal: Negativewithout intent/plan  Therapist Response: Therapist reviewed patient's recent thoughts and behaviors. Therapist utilized CBT to address depression. Therapist processed patient's feelings to identify triggers for mood. Therapist discussed how she has been able to manage mood and stress including her relationships and staying sober.  Plan: Return again in 4 weeks.      Therapist will review patient goals on or before 12.27.2018  Diagnosis: Axis I: Major depressive disorder, recurrent episode, severe with anxious distress    Axis II: No diagnosis    [name], LCSW 05/20/2017

## 2017-05-21 ENCOUNTER — Other Ambulatory Visit (HOSPITAL_COMMUNITY): Payer: Self-pay | Admitting: Psychiatry

## 2017-05-22 DIAGNOSIS — B182 Chronic viral hepatitis C: Secondary | ICD-10-CM | POA: Diagnosis not present

## 2017-05-22 DIAGNOSIS — K7469 Other cirrhosis of liver: Secondary | ICD-10-CM | POA: Diagnosis not present

## 2017-05-26 ENCOUNTER — Other Ambulatory Visit: Payer: Self-pay | Admitting: Nurse Practitioner

## 2017-05-26 DIAGNOSIS — K7469 Other cirrhosis of liver: Secondary | ICD-10-CM

## 2017-05-28 ENCOUNTER — Other Ambulatory Visit (HOSPITAL_COMMUNITY): Payer: Self-pay | Admitting: Psychiatry

## 2017-06-05 ENCOUNTER — Encounter: Payer: Self-pay | Admitting: Family Medicine

## 2017-06-05 ENCOUNTER — Ambulatory Visit (INDEPENDENT_AMBULATORY_CARE_PROVIDER_SITE_OTHER): Payer: Medicare Other | Admitting: Family Medicine

## 2017-06-05 VITALS — BP 118/74 | HR 92 | Temp 98.7°F | Resp 14 | Wt 170.0 lb

## 2017-06-05 DIAGNOSIS — N390 Urinary tract infection, site not specified: Secondary | ICD-10-CM | POA: Diagnosis not present

## 2017-06-05 DIAGNOSIS — R82998 Other abnormal findings in urine: Secondary | ICD-10-CM

## 2017-06-05 DIAGNOSIS — R319 Hematuria, unspecified: Secondary | ICD-10-CM

## 2017-06-05 LAB — URINALYSIS, ROUTINE W REFLEX MICROSCOPIC
Bilirubin Urine: NEGATIVE
GLUCOSE, UA: NEGATIVE
LEUKOCYTES UA: NEGATIVE
Nitrite: POSITIVE — AB
PH: 6 (ref 5.0–8.0)
Specific Gravity, Urine: 1.02 (ref 1.001–1.03)

## 2017-06-05 LAB — MICROSCOPIC MESSAGE

## 2017-06-05 MED ORDER — CIPROFLOXACIN HCL 500 MG PO TABS: 500.0000 mg | ORAL_TABLET | Freq: Two times a day (BID) | ORAL | 0 refills | Status: DC

## 2017-06-05 NOTE — Progress Notes (Signed)
Subjective:    Patient ID: Kathleen Palmer, female    DOB: February 15, 1961, 57 y.o.   MRN: [id number]  HPI  Patient has a history of a urostomy ever since childhood due to multiple urogynecologic procedures being performed.  She states that over the last week, she is a dull lobe mid back pain is constant.  There are no exacerbating or alleviating factors.  She is also reported a change in her urine color.  She reports dark tea colored urine.  She is also reported increasing urine output.  She has mild CVA tenderness on exam.  Urine in the ostomy bag is reddish brown in color.  Urinalysis shows nitrites.  It also shows +3 blood but negative leukocyte esterase. Past Medical History:  Diagnosis Date  . Angiomyolipoma    Left kidney  . Anxiety   . Arthritis   . Attention to urostomy [facility])   . Chronic pain   . Depression   . Hepatitis C    ? Contracted through IVDA  . Panic 04/29/1999  . PTSD (post-traumatic stress disorder)   . Substance abuse ([place])    Remote history- cocaine, ETOH, Marijuana  . Thyroid disease    Past Surgical History:  Procedure Laterality Date  . ABDOMINAL HYSTERECTOMY    . ABDOMINAL SURGERY    . BIOPSY  01/27/2017   Procedure: BIOPSY;  Surgeon: Danie Binder, MD;  [facility];  Service: Endoscopy;;  gastric  . CHOLECYSTECTOMY    . COLONOSCOPY WITH PROPOFOL N/A 01/27/2017   Procedure: COLONOSCOPY WITH PROPOFOL;  Surgeon: Danie Binder, MD;  [facility];  Service: Endoscopy;  Laterality: N/A;  12:00pm  . ESOPHAGOGASTRODUODENOSCOPY (EGD) WITH PROPOFOL N/A 01/27/2017   Procedure: ESOPHAGOGASTRODUODENOSCOPY (EGD) WITH PROPOFOL;  Surgeon: Danie Binder, MD;  [facility];  Service: Endoscopy;  Laterality: N/A;  . HERNIA REPAIR     x2- abdomen  . ILEO LOOP CONDUIT    . 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 VAGINA  RECONSTRUCTION SURGERY     Current Outpatient Medications on File Prior to Visit  Medication Sig Dispense Refill  . ALPRAZolam (XANAX) 0.5 MG tablet Take 1 tablet (0.5 mg total) by mouth 4 (four) times daily. 120 tablet 2  . DULoxetine (CYMBALTA) 60 MG capsule Take 1 capsule (60 mg total) by mouth daily. 30 capsule 2  . HARVONI 90-400 MG TABS Take 1 tablet by mouth daily.   2  . hydrOXYzine (ATARAX/VISTARIL) 25 MG tablet Take 1 tablet (25 mg total) by mouth every 6 (six) hours as needed for anxiety. 90 tablet 2  . linaclotide (LINZESS) 72 MCG capsule Take 1 capsule (72 mcg total) by mouth daily before breakfast. 90 capsule 3  . neomycin-bacitracin-polymyxin (NEOSPORIN) ointment Apply 1 application topically as needed for wound care. apply to eye    . tetrahydrozoline 0.05 % ophthalmic solution Place 1 drop into both eyes daily.    . traZODone (DESYREL) 100 MG tablet Take 2 tablets (200 mg total) by mouth at bedtime as needed for sleep. 60 tablet 2  . Cyanocobalamin (B-12) 500 MCG TABS Take 1 tablet by mouth daily.    . Multiple Vitamins-Minerals (CENTRUM SILVER PO) Take 1 tablet by mouth daily.     No current facility-administered medications on file prior to visit.    Allergies  Allergen Reactions  .  Oxycodone Anxiety  . Abilify [Aripiprazole]     Stiff neck  . Bactrim [Sulfamethoxazole-Trimethoprim]     Tongue swelled  . Gabapentin     Dizziness to the point she actually fell  . Ampicillin Rash    Has patient had a PCN reaction causing immediate rash, facial/tongue/throat swelling, SOB or lightheadedness with hypotension: Unknown Has patient had a PCN reaction causing severe rash involving mucus membranes or skin necrosis: Unknown Has patient had a PCN reaction that required hospitalization: No Has patient had a PCN reaction occurring within the last 10 years: No If all of the above answers are "NO", then may proceed with Cephalosporin use.     Social History   Socioeconomic  History  . Marital status: Married    Spouse name: Not on file  . Number of children: 0  . Years of education: Not on file  . Highest education level: Not on file  Social Needs  . Financial resource strain: Not on file  . Food insecurity - worry: Not on file  . Food insecurity - inability: Not on file  . Transportation needs - medical: Not on file  . Transportation needs - non-medical: Not on file  Occupational History  . Occupation: disability  Tobacco Use  . Smoking status: Former Smoker    Packs/day: 0.25    Years: 33.00    Pack years: 8.25    Types: Cigarettes    Last attempt to quit: 01/21/2015    Years since quitting: 2.3  . Smokeless tobacco: Never Used  . Tobacco comment: 9-10 cigs a day as of 10/20/2012, (02-07-15 per pt, she stopped smoking 01-21-15)  Substance and Sexual Activity  . Alcohol use: No    Alcohol/week: 0.0 oz  . Drug use: Yes    Types: Marijuana    Comment: cocaine back in the 1980s; Marijuana rarely  . Sexual activity: Yes    Birth control/protection: Surgical  Other Topics Concern  . Not on file  Social History Narrative  . Not on file     Review of Systems  All other systems reviewed and are negative.      Objective:   Physical Exam  Cardiovascular: Normal rate, regular rhythm and normal heart sounds.  No murmur heard. Pulmonary/Chest: Effort normal and breath sounds normal.  Abdominal: Soft. Bowel sounds are normal. She exhibits no distension. There is no tenderness. There is no rebound.  Vitals reviewed.         Assessment & Plan:  Dark urine - Plan: Urinalysis, Routine w reflex microscopic, Urine Culture  Urinary tract infection with hematuria, site unspecified  Patient has a possible urinary tract infection.  Begin Cipro 500 mg p.o. twice daily for 5-7 days.  Await urine culture.  I am treating longer in duration because of her complicated medical history.  Urinary tract infection was then be pyelonephritis for this patient.   Await the results of urine culture.  Repeat urinalysis when asymptomatic to ensure resolution of hematuria

## 2017-06-09 ENCOUNTER — Other Ambulatory Visit: Payer: Self-pay | Admitting: Family Medicine

## 2017-06-09 ENCOUNTER — Ambulatory Visit (INDEPENDENT_AMBULATORY_CARE_PROVIDER_SITE_OTHER): Payer: Medicare Other | Admitting: Family Medicine

## 2017-06-09 ENCOUNTER — Ambulatory Visit (HOSPITAL_COMMUNITY)
Admission: RE | Admit: 2017-06-09 | Discharge: 2017-06-09 | Disposition: A | Discharge status: Home / Self Care | Payer: Medicare Other | Source: Ambulatory Visit | Attending: Family Medicine | Admitting: Family Medicine

## 2017-06-09 ENCOUNTER — Other Ambulatory Visit: Payer: Self-pay

## 2017-06-09 ENCOUNTER — Encounter: Payer: Self-pay | Admitting: Family Medicine

## 2017-06-09 VITALS — BP 128/70 | HR 66 | Temp 98.3°F | Resp 16 | Ht 63.0 in | Wt 175.0 lb

## 2017-06-09 DIAGNOSIS — B182 Chronic viral hepatitis C: Secondary | ICD-10-CM

## 2017-06-09 DIAGNOSIS — E039 Hypothyroidism, unspecified: Secondary | ICD-10-CM | POA: Diagnosis not present

## 2017-06-09 DIAGNOSIS — M160 Bilateral primary osteoarthritis of hip: Secondary | ICD-10-CM | POA: Insufficient documentation

## 2017-06-09 DIAGNOSIS — Z932 Ileostomy status: Secondary | ICD-10-CM | POA: Diagnosis not present

## 2017-06-09 DIAGNOSIS — E038 Other specified hypothyroidism: Secondary | ICD-10-CM

## 2017-06-09 DIAGNOSIS — Z23 Encounter for immunization: Secondary | ICD-10-CM | POA: Diagnosis not present

## 2017-06-09 DIAGNOSIS — G894 Chronic pain syndrome: Secondary | ICD-10-CM | POA: Diagnosis not present

## 2017-06-09 DIAGNOSIS — Q6410 Exstrophy of urinary bladder, unspecified: Secondary | ICD-10-CM | POA: Diagnosis not present

## 2017-06-09 LAB — URINE CULTURE
MICRO NUMBER:: 90175445
SPECIMEN QUALITY: ADEQUATE

## 2017-06-09 NOTE — Progress Notes (Signed)
   Subjective:    Patient ID: Kathleen Palmer, female    DOB: [birth year], 59 y.o.   MRN: [id number]  Patient presents for Follow-up (is fasting) Pt here to f/u chronic medical problems Seen last week, treated for UTI , thinks she passed a stone, pain now resolved and urine is clear   She continues with psychiatry , feels good overall  Hep C- being treated at [facility] [place]  [phone] - on [place] started last Thursday, total of 12 weeks,  Dawn is contact  Needs HEPA/B SHOT   Had EGD with biopsy- has gastritis , no NSAIDS   Hip pain, needs xrays of hips she did not get before, has chronic hip pain, known OA, feels lke her hips are going to break when she first gets up, was on chronic narcotic medication for this and her chronic abdominal/GU pain, but off since her last psychiatric admission, now taking tylenol, told up to 2000mg     Psychiatrist - feels good on cymbalta, also on xanax four times a day        Review Of Systems:  GEN- denies fatigue, fever, weight loss,weakness, recent illness HEENT- denies eye drainage, change in vision, nasal discharge, CVS- denies chest pain, palpitations RESP- denies SOB, cough, wheeze ABD- denies N/V, change in stools, abd pain GU- denies dysuria, hematuria, dribbling, incontinence MSK- + joint pain, muscle aches, injury Neuro- denies headache, dizziness, syncope, seizure activity       Objective:    BP 128/70   Pulse 66   Temp 98.3 F (36.8 C) (Oral)   Resp 16   Ht 5\' 3"  (1.6 m)   Wt 175 lb (79.4 kg)   SpO2 98%   BMI 31.00 kg/m  GEN- NAD, alert and oriented x3 HEENT- PERRL, EOMI, non injected sclera, pink conjunctiva, MMM, oropharynx clear Neck- Supple, no thyromegaly CVS- RRR, no murmur RESP-CTAB ABD-NABS,soft,NT,ND EXT- No edema Pulses- Radial, DP- 2+        Assessment & Plan:      Problem List Items Addressed This Visit      Unprioritized   Bladder extrophy   Subclinical  hypothyroidism - Primary    Repeat TFT      Relevant Orders   TSH   T3, free   T4, free   Ileostomy status (HCC)    Urine clear noted in osteomy Improved symptoms, culture pending complete cipro      Chronic pain syndrome    Concern about putting her back on narcotics she is on benzos On Hep C treatment Previously mental illness much worse with all the meds together Stick with tylenol at that time Will have hips evaluated was suppose to do this a year ago but did not follow through She does continue to smoke marijuana      Chronic hepatitis C ([place])    Complete treatment Per Hep C clinic       Other Visit Diagnoses    Primary osteoarthritis of both hips       xray to be done, pending evaluation orthopedics, trying to avoid narcotics      Note: This dictation was prepared with Dragon dictation along with smaller phrase technology. Any transcriptional errors that result from this process are unintentional.

## 2017-06-09 NOTE — Assessment & Plan Note (Signed)
Complete treatment Per Hep C clinic

## 2017-06-09 NOTE — Addendum Note (Signed)
Addended by: Vic Blackbird F on: 06/09/2017 12:28 PM   Modules accepted: Orders

## 2017-06-09 NOTE — Assessment & Plan Note (Signed)
Repeat TFT

## 2017-06-09 NOTE — Patient Instructions (Signed)
Get the xrays done at [facility] We will call with lab results F/U 4 months

## 2017-06-09 NOTE — Assessment & Plan Note (Addendum)
Concern about putting her back on narcotics she is on benzos On Hep C treatment Previously mental illness much worse with all the meds together Stick with tylenol at that time Will have hips evaluated was suppose to do this a year ago but did not follow through She does continue to smoke marijuana

## 2017-06-09 NOTE — Addendum Note (Signed)
Addended by: Sheral Flow on: 06/09/2017 12:10 PM   Modules accepted: Orders

## 2017-06-09 NOTE — Assessment & Plan Note (Signed)
Urine clear noted in osteomy Improved symptoms, culture pending complete cipro

## 2017-06-10 LAB — T3, FREE: T3 FREE: 2.2 pg/mL — AB (ref 2.3–4.2)

## 2017-06-10 LAB — TSH: TSH: 4.65 m[IU]/L — AB (ref 0.40–4.50)

## 2017-06-10 LAB — T4, FREE: Free T4: 1 ng/dL (ref 0.8–1.8)

## 2017-06-17 ENCOUNTER — Ambulatory Visit (HOSPITAL_COMMUNITY): Payer: Self-pay | Admitting: Psychiatry

## 2017-06-22 ENCOUNTER — Encounter (HOSPITAL_COMMUNITY): Payer: Self-pay | Admitting: Licensed Clinical Social Worker

## 2017-06-22 ENCOUNTER — Ambulatory Visit (INDEPENDENT_AMBULATORY_CARE_PROVIDER_SITE_OTHER): Payer: Medicare Other | Admitting: Licensed Clinical Social Worker

## 2017-06-22 DIAGNOSIS — F332 Major depressive disorder, recurrent severe without psychotic features: Secondary | ICD-10-CM | POA: Diagnosis not present
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
## 2017-06-23 ENCOUNTER — Encounter (HOSPITAL_COMMUNITY): Payer: Self-pay | Admitting: Psychiatry

## 2017-06-23 ENCOUNTER — Ambulatory Visit (INDEPENDENT_AMBULATORY_CARE_PROVIDER_SITE_OTHER): Payer: Medicare Other | Admitting: Psychiatry

## 2017-06-23 VITALS — BP 122/84 | HR 75 | Ht 63.0 in | Wt 176.0 lb

## 2017-06-23 DIAGNOSIS — F419 Anxiety disorder, unspecified: Secondary | ICD-10-CM

## 2017-06-23 DIAGNOSIS — Z79899 Other long term (current) drug therapy: Secondary | ICD-10-CM

## 2017-06-23 DIAGNOSIS — Z818 Family history of other mental and behavioral disorders: Secondary | ICD-10-CM | POA: Diagnosis not present

## 2017-06-23 DIAGNOSIS — F332 Major depressive disorder, recurrent severe without psychotic features: Secondary | ICD-10-CM

## 2017-06-23 DIAGNOSIS — F603 Borderline personality disorder: Secondary | ICD-10-CM

## 2017-06-23 DIAGNOSIS — Z87891 Personal history of nicotine dependence: Secondary | ICD-10-CM | POA: Diagnosis not present

## 2017-06-23 MED ORDER — HYDROXYZINE HCL 25 MG PO TABS: 25.0000 mg | ORAL_TABLET | Freq: Four times a day (QID) | ORAL | 2 refills | Status: DC | PRN

## 2017-06-23 MED ORDER — TRAZODONE HCL 100 MG PO TABS: 200.0000 mg | ORAL_TABLET | Freq: Every evening | ORAL | 2 refills | Status: DC | PRN

## 2017-06-23 MED ORDER — ALPRAZOLAM 1 MG PO TABS: 1.0000 mg | ORAL_TABLET | Freq: Three times a day (TID) | ORAL | 2 refills | Status: DC

## 2017-06-23 MED ORDER — DULOXETINE HCL 60 MG PO CPEP: 60.0000 mg | ORAL_CAPSULE | Freq: Every day | ORAL | 2 refills | Status: DC

## 2017-06-23 NOTE — Progress Notes (Signed)
[facility] MD/PA/NP OP Progress Note  06/23/2017 11:07 AM Kathleen Palmer  MRN:  [id number]  Chief Complaint:  Chief Complaint    Depression; Anxiety; Follow-up     HPI: This patient is a 57 year old white female who lives with her husband.  She is currently on disability.  She returns for follow-up of treatment of her depression and anxiety  The patient returns after 6 weeks.  Last time we switch from Prozac to Cymbalta.  She states that she has seen a big improvement.  She is more relaxed and able to do more she has more energy and feels less depressed.  She still cries occasionally but not like she was before.  She does not think the Xanax is working as well because by the late afternoon she is very shaky.  She takes 0.5 mg 4 times a day and I suggested we increase a little bit to 1 mg 3 times a day.  She is sleeping well with the trazodone.  She is making progress in her therapy here Visit Diagnosis:    ICD-10-CM   1. Major depressive disorder, recurrent episode, severe with anxious distress ([place]) F33.2     Past Psychiatric History: Several previous hospitalizations for depression and long term outpatient treatment  Past Medical History:  Past Medical History:  Diagnosis Date  . Angiomyolipoma    Left kidney  . Anxiety   . Arthritis   . Attention to urostomy [facility])   . Chronic pain   . Depression   . Hepatitis C    ? Contracted through [facility]  . Panic 04/29/1999  . PTSD (post-traumatic stress disorder)   . Substance abuse ([place])    Remote history- cocaine, ETOH, Marijuana  . Thyroid disease     Past Surgical History:  Procedure Laterality Date  . ABDOMINAL HYSTERECTOMY    . ABDOMINAL SURGERY    . BIOPSY  01/27/2017   Procedure: BIOPSY;  Surgeon: Danie Binder, MD;  Location: [facility];  Service: Endoscopy;;  gastric  . CHOLECYSTECTOMY    . COLONOSCOPY WITH PROPOFOL N/A 01/27/2017   Procedure: COLONOSCOPY WITH PROPOFOL;  Surgeon: Danie Binder, MD;  Location: [facility];   Service: Endoscopy;  Laterality: N/A;  12:00pm  . ESOPHAGOGASTRODUODENOSCOPY (EGD) WITH PROPOFOL N/A 01/27/2017   Procedure: ESOPHAGOGASTRODUODENOSCOPY (EGD) WITH PROPOFOL;  Surgeon: Danie Binder, MD;  Location: [facility];  Service: Endoscopy;  Laterality: N/A;  . HERNIA REPAIR     x2- abdomen  . ILEO LOOP CONDUIT    . 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 VAGINA RECONSTRUCTION SURGERY      Family Psychiatric History: See below  Family History:  Family History  Adopted: Yes  Problem Relation Age of Onset  . Depression Mother   . Depression Sister   . Anxiety disorder Sister   . Bipolar disorder Sister   . Depression Maternal Grandmother   . ADD / ADHD Neg Hx   . Alcohol abuse Neg Hx   . Drug abuse Neg Hx   . Dementia Neg Hx   . OCD Neg Hx   . Paranoid behavior Neg Hx   . Schizophrenia Neg Hx   . Seizures Neg Hx   . Sexual abuse Neg Hx   . Physical abuse Neg Hx   . Suicidality Neg Hx   . Colon cancer Neg Hx  Social History:  Social History   Socioeconomic History  . Marital status: Married    Spouse name: None  . Number of children: 0  . Years of education: None  . Highest education level: None  Social Needs  . Financial resource strain: None  . Food insecurity - worry: None  . Food insecurity - inability: None  . Transportation needs - medical: None  . Transportation needs - non-medical: None  Occupational History  . Occupation: disability  Tobacco Use  . Smoking status: Former Smoker    Packs/day: 0.25    Years: 33.00    Pack years: 8.25    Types: Cigarettes    Last attempt to quit: 01/21/2015    Years since quitting: 2.4  . Smokeless tobacco: Never Used  . Tobacco comment: 9-10 cigs a day as of 10/20/2012, (02-07-15 per pt, she stopped smoking 01-21-15)  Substance and Sexual Activity  . Alcohol use: No    Alcohol/week: 0.0 oz   . Drug use: Yes    Types: Marijuana    Comment: cocaine back in the 1980s; Marijuana rarely  . Sexual activity: Yes    Birth control/protection: Surgical  Other Topics Concern  . None  Social History Narrative  . None    Allergies:  Allergies  Allergen Reactions  . Oxycodone Anxiety  . Abilify [Aripiprazole]     Stiff neck  . Bactrim [Sulfamethoxazole-Trimethoprim]     Tongue swelled  . Gabapentin     Dizziness to the point she actually fell  . Ampicillin Rash    Has patient had a PCN reaction causing immediate rash, facial/tongue/throat swelling, SOB or lightheadedness with hypotension: Unknown Has patient had a PCN reaction causing severe rash involving mucus membranes or skin necrosis: Unknown Has patient had a PCN reaction that required hospitalization: No Has patient had a PCN reaction occurring within the last 10 years: No If all of the above answers are "NO", then may proceed with Cephalosporin use.      Metabolic Disorder Labs: Lab Results  Component Value Date   HGBA1C 5.2 12/05/2016   MPG 102.54 12/05/2016   MPG 117 (H) 10/23/2014   No results found for: PROLACTIN Lab Results  Component Value Date   CHOL 129 12/05/2016   TRIG 69 12/05/2016   HDL 45 12/05/2016   CHOLHDL 2.9 12/05/2016   VLDL 14 12/05/2016   LDLCALC 70 12/05/2016   LDLCALC 107 (H) 10/23/2014   Lab Results  Component Value Date   TSH 4.65 (H) 06/09/2017   TSH 6.90 (H) 01/19/2017    Therapeutic Level Labs: No results found for: LITHIUM No results found for: VALPROATE No components found for:  CBMZ  Current Medications: Current Outpatient Medications  Medication Sig Dispense Refill  . ciprofloxacin (CIPRO) 500 MG tablet Take 1 tablet (500 mg total) by mouth 2 (two) times daily. 14 tablet 0  . Cyanocobalamin (B-12) 500 MCG TABS Take 1 tablet by mouth daily.    . DULoxetine (CYMBALTA) 60 MG capsule Take 1 capsule (60 mg total) by mouth daily. 30 capsule 2  . HARVONI 90-400 MG  TABS Take 1 tablet by mouth daily.   2  . hydrOXYzine (ATARAX/VISTARIL) 25 MG tablet Take 1 tablet (25 mg total) by mouth every 6 (six) hours as needed for anxiety. 90 tablet 2  . linaclotide (LINZESS) 72 MCG capsule Take 1 capsule (72 mcg total) by mouth daily before breakfast. 90 capsule 3  . Multiple Vitamins-Minerals (CENTRUM SILVER PO) Take 1 tablet by  mouth daily.    Marland Kitchen neomycin-bacitracin-polymyxin (NEOSPORIN) ointment Apply 1 application topically as needed for wound care. apply to eye    . tetrahydrozoline 0.05 % ophthalmic solution Place 1 drop into both eyes daily.    . traZODone (DESYREL) 100 MG tablet Take 2 tablets (200 mg total) by mouth at bedtime as needed for sleep. 60 tablet 2  . ALPRAZolam (XANAX) 1 MG tablet Take 1 tablet (1 mg total) by mouth 3 (three) times daily. 90 tablet 2   No current facility-administered medications for this visit.      Musculoskeletal: Strength & Muscle Tone: within normal limits Gait & Station: normal Patient leans: N/A  Psychiatric Specialty Exam: Review of Systems  Musculoskeletal: Positive for back pain.  Psychiatric/Behavioral: The patient is nervous/anxious.   All other systems reviewed and are negative.   Blood pressure 122/84, pulse 75, height 5\' 3"  (1.6 m), weight 176 lb (79.8 kg), SpO2 93 %.Body mass index is 31.18 kg/m.  General Appearance: Casual, Neat and Well Groomed  Eye Contact:  Good  Speech:  Clear and Coherent  Volume:  Normal  Mood:  Anxious  Affect:  Congruent  Thought Process:  Goal Directed  Orientation:  Full (Time, Place, and Person)  Thought Content: Rumination   Suicidal Thoughts:  No  Homicidal Thoughts:  No  Memory:  Immediate;   Good Recent;   Good Remote;   Fair  Judgement:  Fair  Insight:  Fair  Psychomotor Activity:  Normal  Concentration:  Concentration: Good and Attention Span: Good  Recall:  Good  Fund of Knowledge: Good  Language: Good  Akathisia:  No  Handed:  Right  AIMS (if  indicated): not done  Assets:  Communication Skills Desire for Improvement Resilience Social Support Talents/Skills  ADL's:  Intact  Cognition: WNL  Sleep:  Good   Screenings: AIMS     Admission (Discharged) from 12/03/2016 in [facility]  AIMS Total Score  0    AUDIT     Admission (Discharged) from 12/03/2016 in [facility] [facility]  Alcohol Use Disorder Identification Test Final Score (AUDIT)  0    MDI     Office Visit from 11/15/2015 in [facility]  Total Score (max 50)  24    PHQ2-9     Office Visit from 01/19/2017 in [place] Office Visit from 12/10/2016 in [place] Office Visit from 10/17/2016 in [place] Office Visit from 03/07/2014 in [place]  PHQ-2 Total Score  4  4  2  4   PHQ-9 Total Score  17  15  8  14        Assessment and Plan: Patient is a 57 year old female with a long history of depression and anxiety as well as some borderline personality traits.  She seems to be doing better on her current regimen.  She will continue Cymbalta 60 mg daily for depression, Xanax will be increased to 1 mg 3 times a day for anxiety.  She can also use hydroxyzine 25 mg every 6 hours as needed for anxiety and continue trazodone 200 mg at bedtime for sleep.  She will return to see me in 2 months   Levonne Spiller, MD 06/23/2017, 11:07 AM

## 2017-06-25 ENCOUNTER — Other Ambulatory Visit: Payer: Self-pay

## 2017-07-03 DIAGNOSIS — B182 Chronic viral hepatitis C: Secondary | ICD-10-CM | POA: Diagnosis not present

## 2017-07-06 ENCOUNTER — Other Ambulatory Visit: Payer: Self-pay

## 2017-07-07 ENCOUNTER — Ambulatory Visit
Admission: RE | Admit: 2017-07-07 | Discharge: 2017-07-07 | Disposition: A | Discharge status: Home / Self Care | Payer: Medicare Other | Source: Ambulatory Visit | Attending: Nurse Practitioner | Admitting: Nurse Practitioner

## 2017-07-07 DIAGNOSIS — K7460 Unspecified cirrhosis of liver: Secondary | ICD-10-CM | POA: Diagnosis not present

## 2017-07-07 DIAGNOSIS — K7469 Other cirrhosis of liver: Secondary | ICD-10-CM

## 2017-07-21 ENCOUNTER — Ambulatory Visit (HOSPITAL_COMMUNITY): Payer: Self-pay | Admitting: Licensed Clinical Social Worker

## 2017-07-27 NOTE — Progress Notes (Deleted)
Referring Provider: Alycia Rossetti, MD Primary Care Physician:  Alycia Rossetti, MD Primary GI:  Dr. Oneida Alar  No chief complaint on file.   HPI:   Kathleen Palmer is a 57 y.o. female who presents for 4-70-month follow-up related to hepatitis C.  The patient was last seen in our office 12/30/2016 for hepatitis C, constipation.  Historically she was diagnosed in 1996 with chronic hepatitis C and is treatment nave.  Anxiety and depression managed by psychiatry.  Hepatitis C RNA in August 2015 with near 18 million copies.  Genotype 1A.  Noted history of multiple pelvic surgeries/compensation/ileal loop conduit.  Refused colonoscopy.  History of remote IV drug use/cocaine but none since the 1980s.  Noted significant fibrosis on ultrasound in 2016 but never completed process to start treatment.  Elastography found F3 to F4.  History of suicidal ideation.  Low platelet count historically.  Overall her meld score was 9, child Pugh class a.  Hepatitis C RNA completed 10/17/2016 which found 489,000 copies.  At her last visit she indicated she was off all opiates.  Having constipation and chronic abdominal pain related to several abdominal surgeries.  She thinks she is developed hernias.  Denies overt hepatic symptoms.  She does note some tremors/shakes, but this is occasional and not persistent.  Uses THC due to chronic pain.  Commended updated labs, Linzess 72 mcg samples with a progress report, colonoscopy and upper endoscopy, refer to the liver clinic in [facility] for treatment of complicated hepatitis C.  Confirmed appointment scheduled for 02/02/2017 for the patient at the liver clinic.  EGD completed 01-2017 which found moderate gastritis.  Recommended follow-up in 6 months, repeat EGD to 3 years for variceal screening.  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 Pylori.  Colonoscopy completed the same day found redundant left colon,  diverticulosis in the rectosigmoid colon, sigmoid colon, descending colon, and cecum.  Exam otherwise normal.  Recommended high-fiber diet, continue current medications, repeat colonoscopy in 10 years.  Today she states   Past Medical History:  Diagnosis Date  . Angiomyolipoma    Left kidney  . Anxiety   . Arthritis   . Attention to urostomy [facility])   . Chronic pain   . Depression   . Hepatitis C    ? Contracted through IVDA  . Panic 04/29/1999  . PTSD (post-traumatic stress disorder)   . Substance abuse ([place])    Remote history- cocaine, ETOH, Marijuana  . Thyroid disease     Past Surgical History:  Procedure Laterality Date  . ABDOMINAL HYSTERECTOMY    . ABDOMINAL SURGERY    . BIOPSY  01/27/2017   Procedure: BIOPSY;  Surgeon: Danie Binder, MD;  [facility];  Service: Endoscopy;;  gastric  . CHOLECYSTECTOMY    . COLONOSCOPY WITH PROPOFOL N/A 01/27/2017   Procedure: COLONOSCOPY WITH PROPOFOL;  Surgeon: Danie Binder, MD;  [facility];  Service: Endoscopy;  Laterality: N/A;  12:00pm  . ESOPHAGOGASTRODUODENOSCOPY (EGD) WITH PROPOFOL N/A 01/27/2017   Procedure: ESOPHAGOGASTRODUODENOSCOPY (EGD) WITH PROPOFOL;  Surgeon: Danie Binder, MD;  [facility];  Service: Endoscopy;  Laterality: N/A;  . HERNIA REPAIR     x2- abdomen  . ILEO LOOP CONDUIT    . 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 VAGINA RECONSTRUCTION  SURGERY      Current Outpatient Medications  Medication Sig Dispense Refill  . ALPRAZolam (XANAX) 1 MG tablet Take 1 tablet (1 mg total) by mouth 3 (three) times daily. 90 tablet 2  . ciprofloxacin (CIPRO) 500 MG tablet Take 1 tablet (500 mg total) by mouth 2 (two) times daily. 14 tablet 0  . Cyanocobalamin (B-12) 500 MCG TABS Take 1 tablet by mouth daily.    . DULoxetine (CYMBALTA) 60 MG capsule Take 1 capsule (60 mg  total) by mouth daily. 30 capsule 2  . HARVONI 90-400 MG TABS Take 1 tablet by mouth daily.   2  . hydrOXYzine (ATARAX/VISTARIL) 25 MG tablet Take 1 tablet (25 mg total) by mouth every 6 (six) hours as needed for anxiety. 90 tablet 2  . linaclotide (LINZESS) 72 MCG capsule Take 1 capsule (72 mcg total) by mouth daily before breakfast. 90 capsule 3  . Multiple Vitamins-Minerals (CENTRUM SILVER PO) Take 1 tablet by mouth daily.    Marland Kitchen neomycin-bacitracin-polymyxin (NEOSPORIN) ointment Apply 1 application topically as needed for wound care. apply to eye    . tetrahydrozoline 0.05 % ophthalmic solution Place 1 drop into both eyes daily.    . traZODone (DESYREL) 100 MG tablet Take 2 tablets (200 mg total) by mouth at bedtime as needed for sleep. 60 tablet 2   No current facility-administered medications for this visit.     Allergies as of 07/28/2017 - Review Complete 06/23/2017  Allergen Reaction Noted  . Oxycodone Anxiety 03/05/2012  . Abilify [aripiprazole]  06/08/2013  . Bactrim [sulfamethoxazole-trimethoprim]  12/01/2011  . Gabapentin  12/01/2011  . Ampicillin Rash 12/01/2011    Family History  Adopted: Yes  Problem Relation Age of Onset  . Depression Mother   . Depression Sister   . Anxiety disorder Sister   . Bipolar disorder Sister   . Depression Maternal Grandmother   . ADD / ADHD Neg Hx   . Alcohol abuse Neg Hx   . Drug abuse Neg Hx   . Dementia Neg Hx   . OCD Neg Hx   . Paranoid behavior Neg Hx   . Schizophrenia Neg Hx   . Seizures Neg Hx   . Sexual abuse Neg Hx   . Physical abuse Neg Hx   . Suicidality Neg Hx   . Colon cancer Neg Hx     Social History   Socioeconomic History  . Marital status: Married    Spouse name: Not on file  . Number of children: 0  . Years of education: Not on file  . Highest education level: Not on file  Occupational History  . Occupation: disability  Social Needs  . Financial resource strain: Not on file  . Food insecurity:     Worry: Not on file    Inability: Not on file  . Transportation needs:    Medical: Not on file    Non-medical: Not on file  Tobacco Use  . Smoking status: Former Smoker    Packs/day: 0.25    Years: 33.00    Pack years: 8.25    Types: Cigarettes    Last attempt to quit: 01/21/2015    Years since quitting: 2.5  . Smokeless tobacco: Never Used  . Tobacco comment: 9-10 cigs a day as of 10/20/2012, (02-07-15 per pt, she stopped smoking 01-21-15)  Substance and Sexual Activity  . Alcohol use: No    Alcohol/week: 0.0 oz  . Drug use: Yes    Types: Marijuana  Comment: cocaine back in the 1980s; Marijuana rarely  . Sexual activity: Yes    Birth control/protection: Surgical  Lifestyle  . Physical activity:    Days per week: Not on file    Minutes per session: Not on file  . Stress: Not on file  Relationships  . Social connections:    Talks on phone: Not on file    Gets together: Not on file    Attends religious service: Not on file    Active member of club or organization: Not on file    Attends meetings of clubs or organizations: Not on file    Relationship status: Not on file  Other Topics Concern  . Not on file  Social History Narrative  . Not on file    Review of Systems: General: Negative for anorexia, weight loss, fever, chills, fatigue, weakness. Eyes: Negative for vision changes.  ENT: Negative for hoarseness, difficulty swallowing , nasal congestion. CV: Negative for chest pain, angina, palpitations, dyspnea on exertion, peripheral edema.  Respiratory: Negative for dyspnea at rest, dyspnea on exertion, cough, sputum, wheezing.  GI: See history of present illness. GU:  Negative for dysuria, hematuria, urinary incontinence, urinary frequency, nocturnal urination.  MS: Negative for joint pain, low back pain.  Derm: Negative for rash or itching.  Neuro: Negative for weakness, abnormal sensation, seizure, frequent headaches, memory loss, confusion.  Psych: Negative for  anxiety, depression, suicidal ideation, hallucinations.  Endo: Negative for unusual weight change.  Heme: Negative for bruising or bleeding. Allergy: Negative for rash or hives.   Physical Exam: There were no vitals taken for this visit. General:   Alert and oriented. Pleasant and cooperative. Well-nourished and well-developed.  Head:  Normocephalic and atraumatic. Eyes:  Without icterus, sclera clear and conjunctiva pink.  Ears:  Normal auditory acuity. Mouth:  No deformity or lesions, oral mucosa pink.  Throat/Neck:  Supple, without mass or thyromegaly. Cardiovascular:  S1, S2 present without murmurs appreciated. Normal pulses noted. Extremities without clubbing or edema. Respiratory:  Clear to auscultation bilaterally. No wheezes, rales, or rhonchi. No distress.  Gastrointestinal:  +BS, soft, non-tender and non-distended. No HSM noted. No guarding or rebound. No masses appreciated.  Rectal:  Deferred  Musculoskalatal:  Symmetrical without gross deformities. Normal posture. Skin:  Intact without significant lesions or rashes. Neurologic:  Alert and oriented x4;  grossly normal neurologically. Psych:  Alert and cooperative. Normal mood and affect. Heme/Lymph/Immune: No significant cervical adenopathy. No excessive bruising noted.    07/27/2017 10:26 AM   Disclaimer: This note was dictated with voice recognition software. Similar sounding words can inadvertently be transcribed and may not be corrected upon review.

## 2017-07-28 ENCOUNTER — Ambulatory Visit: Payer: Medicare Other | Admitting: Nurse Practitioner

## 2017-08-03 ENCOUNTER — Other Ambulatory Visit (HOSPITAL_COMMUNITY): Payer: Self-pay | Admitting: Psychiatry

## 2017-08-14 ENCOUNTER — Ambulatory Visit (INDEPENDENT_AMBULATORY_CARE_PROVIDER_SITE_OTHER): Payer: Medicare Other | Admitting: Licensed Clinical Social Worker

## 2017-08-14 ENCOUNTER — Encounter (HOSPITAL_COMMUNITY): Payer: Self-pay | Admitting: Licensed Clinical Social Worker

## 2017-08-14 ENCOUNTER — Ambulatory Visit (HOSPITAL_COMMUNITY): Payer: Medicare Other | Admitting: Licensed Clinical Social Worker

## 2017-08-14 DIAGNOSIS — F332 Major depressive disorder, recurrent severe without psychotic features: Secondary | ICD-10-CM
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
## 2017-08-24 ENCOUNTER — Ambulatory Visit (INDEPENDENT_AMBULATORY_CARE_PROVIDER_SITE_OTHER): Payer: Medicare Other | Admitting: Psychiatry

## 2017-08-24 ENCOUNTER — Encounter (HOSPITAL_COMMUNITY): Payer: Self-pay | Admitting: Psychiatry

## 2017-08-24 VITALS — BP 148/82 | HR 69 | Ht 63.0 in | Wt 182.0 lb

## 2017-08-24 DIAGNOSIS — Z87891 Personal history of nicotine dependence: Secondary | ICD-10-CM

## 2017-08-24 DIAGNOSIS — F1290 Cannabis use, unspecified, uncomplicated: Secondary | ICD-10-CM | POA: Diagnosis not present

## 2017-08-24 DIAGNOSIS — G4700 Insomnia, unspecified: Secondary | ICD-10-CM | POA: Diagnosis not present

## 2017-08-24 DIAGNOSIS — M2550 Pain in unspecified joint: Secondary | ICD-10-CM | POA: Diagnosis not present

## 2017-08-24 DIAGNOSIS — M549 Dorsalgia, unspecified: Secondary | ICD-10-CM | POA: Diagnosis not present

## 2017-08-24 DIAGNOSIS — F332 Major depressive disorder, recurrent severe without psychotic features: Secondary | ICD-10-CM

## 2017-08-24 DIAGNOSIS — F4310 Post-traumatic stress disorder, unspecified: Secondary | ICD-10-CM

## 2017-08-24 MED ORDER — TRAZODONE HCL 100 MG PO TABS: 200.0000 mg | ORAL_TABLET | Freq: Every evening | ORAL | 2 refills | Status: DC | PRN

## 2017-08-24 MED ORDER — ALPRAZOLAM 1 MG PO TABS: 1.0000 mg | ORAL_TABLET | Freq: Three times a day (TID) | ORAL | 2 refills | Status: DC

## 2017-08-24 MED ORDER — DULOXETINE HCL 60 MG PO CPEP: 60.0000 mg | ORAL_CAPSULE | Freq: Every day | ORAL | 2 refills | Status: DC

## 2017-08-24 NOTE — Progress Notes (Signed)
[place] MD/PA/NP OP Progress Note  08/24/2017 10:29 AM Kathleen Palmer  MRN:  [id number]  Chief Complaint:  Chief Complaint    Depression; Anxiety; Follow-up     HPI: This patient is a 57 year old white female who lives with her husband.  She is currently on disability she returns for follow-up of depression and anxiety.  The patient returns after 2 months.  She is doing well.  She states that this is the best that she has felt in 20 years.  Going on Cymbalta has made a big difference.  Last time we increased Xanax and she is less anxious.  She is sleeping well with the trazodone.  She still cries about her twin sisters illness-ALS.  However she tries to go on the next day and move forward and she seems to have a much more positive attitude Visit Diagnosis:    ICD-10-CM   1. Major depressive disorder, recurrent episode, severe with anxious distress ([place]) F33.2   2. PTSD (post-traumatic stress disorder) F43.10     Past Psychiatric History: Several previous hospitalizations for depression and long-term outpatient treatment  Past Medical History:  Past Medical History:  Diagnosis Date  . Angiomyolipoma    Left kidney  . Anxiety   . Arthritis   . Attention to urostomy [facility])   . Chronic pain   . Depression   . Hepatitis C    ? Contracted through IVDA  . Panic 04/29/1999  . PTSD (post-traumatic stress disorder)   . Substance abuse ([place])    Remote history- cocaine, ETOH, Marijuana  . Thyroid disease     Past Surgical History:  Procedure Laterality Date  . ABDOMINAL HYSTERECTOMY    . ABDOMINAL SURGERY    . BIOPSY  01/27/2017   Procedure: BIOPSY;  Surgeon: Danie Binder, MD;  [facility];  Service: Endoscopy;;  gastric  . CHOLECYSTECTOMY    . COLONOSCOPY WITH PROPOFOL N/A 01/27/2017   Procedure: COLONOSCOPY WITH PROPOFOL;  Surgeon: Danie Binder, MD;  [facility];  Service: Endoscopy;  Laterality: N/A;  12:00pm  . ESOPHAGOGASTRODUODENOSCOPY (EGD) WITH PROPOFOL  N/A 01/27/2017   Procedure: ESOPHAGOGASTRODUODENOSCOPY (EGD) WITH PROPOFOL;  Surgeon: Danie Binder, MD;  [facility];  Service: Endoscopy;  Laterality: N/A;  . HERNIA REPAIR     x2- abdomen  . ILEO LOOP CONDUIT    . 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 VAGINA RECONSTRUCTION SURGERY      Family Psychiatric History: See below  Family History:  Family History  Adopted: Yes  Problem Relation Age of Onset  . Depression Mother   . Depression Sister   . Anxiety disorder Sister   . Bipolar disorder Sister   . Depression Maternal Grandmother   . ADD / ADHD Neg Hx   . Alcohol abuse Neg Hx   . Drug abuse Neg Hx   . Dementia Neg Hx   . OCD Neg Hx   . Paranoid behavior Neg Hx   . Schizophrenia Neg Hx   . Seizures Neg Hx   . Sexual abuse Neg Hx   . Physical abuse Neg Hx   . Suicidality Neg Hx   . Colon cancer Neg Hx     Social History:  Social History   Socioeconomic History  . Marital status: Married    Spouse name: Not on file  .  Number of children: 0  . Years of education: Not on file  . Highest education level: Not on file  Occupational History  . Occupation: disability  Social Needs  . Financial resource strain: Not on file  . Food insecurity:    Worry: Not on file    Inability: Not on file  . Transportation needs:    Medical: Not on file    Non-medical: Not on file  Tobacco Use  . Smoking status: Former Smoker    Packs/day: 0.25    Years: 33.00    Pack years: 8.25    Types: Cigarettes    Last attempt to quit: 01/21/2015    Years since quitting: 2.5  . Smokeless tobacco: Never Used  . Tobacco comment: 9-10 cigs a day as of 10/20/2012, (02-07-15 per pt, she stopped smoking 01-21-15)  Substance and Sexual Activity  . Alcohol use: No    Alcohol/week: 0.0 oz  . Drug use: Yes    Types: Marijuana    Comment: cocaine back in the 1980s;  Marijuana rarely  . Sexual activity: Yes    Birth control/protection: Surgical  Lifestyle  . Physical activity:    Days per week: Not on file    Minutes per session: Not on file  . Stress: Not on file  Relationships  . Social connections:    Talks on phone: Not on file    Gets together: Not on file    Attends religious service: Not on file    Active member of club or organization: Not on file    Attends meetings of clubs or organizations: Not on file    Relationship status: Not on file  Other Topics Concern  . Not on file  Social History Narrative  . Not on file    Allergies:  Allergies  Allergen Reactions  . Oxycodone Anxiety  . Abilify [Aripiprazole]     Stiff neck  . Bactrim [Sulfamethoxazole-Trimethoprim]     Tongue swelled  . Gabapentin     Dizziness to the point she actually fell  . Ampicillin Rash    Has patient had a PCN reaction causing immediate rash, facial/tongue/throat swelling, SOB or lightheadedness with hypotension: Unknown Has patient had a PCN reaction causing severe rash involving mucus membranes or skin necrosis: Unknown Has patient had a PCN reaction that required hospitalization: No Has patient had a PCN reaction occurring within the last 10 years: No If all of the above answers are "NO", then may proceed with Cephalosporin use.      Metabolic Disorder Labs: Lab Results  Component Value Date   HGBA1C 5.2 12/05/2016   MPG 102.54 12/05/2016   MPG 117 (H) 10/23/2014   No results found for: PROLACTIN Lab Results  Component Value Date   CHOL 129 12/05/2016   TRIG 69 12/05/2016   HDL 45 12/05/2016   CHOLHDL 2.9 12/05/2016   VLDL 14 12/05/2016   LDLCALC 70 12/05/2016   LDLCALC 107 (H) 10/23/2014   Lab Results  Component Value Date   TSH 4.65 (H) 06/09/2017   TSH 6.90 (H) 01/19/2017    Therapeutic Level Labs: No results found for: LITHIUM No results found for: VALPROATE No components found for:  CBMZ  Current Medications: Current  Outpatient Medications  Medication Sig Dispense Refill  . ALPRAZolam (XANAX) 1 MG tablet Take 1 tablet (1 mg total) by mouth 3 (three) times daily. 90 tablet 2  . ciprofloxacin (CIPRO) 500 MG tablet Take 1 tablet (500 mg total) by mouth 2 (two)  times daily. 14 tablet 0  . Cyanocobalamin (B-12) 500 MCG TABS Take 1 tablet by mouth daily.    . DULoxetine (CYMBALTA) 60 MG capsule Take 1 capsule (60 mg total) by mouth daily. 30 capsule 2  . HARVONI 90-400 MG TABS Take 1 tablet by mouth daily.   2  . hydrOXYzine (ATARAX/VISTARIL) 25 MG tablet Take 1 tablet (25 mg total) by mouth every 6 (six) hours as needed for anxiety. 90 tablet 2  . linaclotide (LINZESS) 72 MCG capsule Take 1 capsule (72 mcg total) by mouth daily before breakfast. 90 capsule 3  . Multiple Vitamins-Minerals (CENTRUM SILVER PO) Take 1 tablet by mouth daily.    Marland Kitchen neomycin-bacitracin-polymyxin (NEOSPORIN) ointment Apply 1 application topically as needed for wound care. apply to eye    . tetrahydrozoline 0.05 % ophthalmic solution Place 1 drop into both eyes daily.    . traZODone (DESYREL) 100 MG tablet Take 2 tablets (200 mg total) by mouth at bedtime as needed for sleep. 60 tablet 2   No current facility-administered medications for this visit.      Musculoskeletal: Strength & Muscle Tone: within normal limits Gait & Station: normal Patient leans: N/A  Psychiatric Specialty Exam: Review of Systems  Musculoskeletal: Positive for back pain, joint pain and myalgias.  All other systems reviewed and are negative.   Blood pressure (!) 148/82, pulse 69, height 5\' 3"  (1.6 m), weight 182 lb (82.6 kg), SpO2 94 %.Body mass index is 32.24 kg/m.  General Appearance: Casual, Neat and Well Groomed  Eye Contact:  Good  Speech:  Clear and Coherent  Volume:  Normal  Mood:  Euthymic  Affect:  Congruent  Thought Process:  Goal Directed  Orientation:  Full (Time, Place, and Person)  Thought Content: WDL   Suicidal Thoughts:  No   Homicidal Thoughts:  No  Memory:  Immediate;   Good Recent;   Good Remote;   Fair  Judgement:  Fair  Insight:  Fair  Psychomotor Activity:  Normal  Concentration:  Concentration: Good and Attention Span: Good  Recall:  Good  Fund of Knowledge: Good  Language: Good  Akathisia:  No  Handed:  Right  AIMS (if indicated): not done  Assets:  Communication Skills Desire for Improvement Resilience Social Support Talents/Skills  ADL's:  Intact  Cognition: WNL  Sleep:  Good   Screenings: AIMS     Admission (Discharged) from 12/03/2016 in [place] [facility]  AIMS Total Score  0    AUDIT     Admission (Discharged) from 12/03/2016 in [place] [facility]  Alcohol Use Disorder Identification Test Final Score (AUDIT)  0    MDI     Office Visit from 11/15/2015 in [place] [facility]  Total Score (max 50)  24    PHQ2-9     Office Visit from 01/19/2017 in [place] Office Visit from 12/10/2016 in [place] Office Visit from 10/17/2016 in [place] Office Visit from 03/07/2014 in [place]  PHQ-2 Total Score  4  4  2  4   PHQ-9 Total Score  17  15  8  14        Assessment and Plan: Patient is a 57 year old female with a history of significant depression and anxiety.  She is doing much better on her current regimen.  She will continue Cymbalta 60 mg daily for depression, Xanax 1 mg 3 times daily for anxiety  and trazodone 200 mg at bedtime for sleep.  She will return to see me in 2 months   Levonne Spiller, MD 08/24/2017, 10:29 AM

## 2017-08-27 DIAGNOSIS — B182 Chronic viral hepatitis C: Secondary | ICD-10-CM | POA: Diagnosis not present

## 2017-09-02 DIAGNOSIS — B182 Chronic viral hepatitis C: Secondary | ICD-10-CM | POA: Diagnosis not present

## 2017-09-02 DIAGNOSIS — K760 Fatty (change of) liver, not elsewhere classified: Secondary | ICD-10-CM | POA: Diagnosis not present

## 2017-09-02 DIAGNOSIS — K7469 Other cirrhosis of liver: Secondary | ICD-10-CM | POA: Diagnosis not present

## 2017-09-24 ENCOUNTER — Ambulatory Visit (HOSPITAL_COMMUNITY): Payer: Self-pay | Admitting: Licensed Clinical Social Worker

## 2017-09-28 ENCOUNTER — Ambulatory Visit: Payer: Medicare Other | Admitting: Nurse Practitioner

## 2017-09-28 NOTE — Progress Notes (Deleted)
Referring Provider: Alycia Rossetti, MD Primary Care Physician:  Alycia Rossetti, MD Primary GI:  Dr. Oneida Alar  No chief complaint on file.   HPI:   Kathleen Palmer is a 57 y.o. female who presents to follow-up on hepatitis C.  The patient was last seen in our office 12/31/2014.  Otitis C initially diagnosed in 1996, treatment nave.  History of anxiety and depression managed by psychiatry.  Diagnosis was confirmed August 2015 with hepatitis C RNA nearly 18 million copies, genotype 1a.  Ultrasound in 2016 showed significant fibrosis but she did not follow through with treatment.  She was admitted for suicidal ideation recently with calculated meld score of 9, child Pugh class a, hepatitis C RNA positive at 489,000 copies.  CT of the abdomen and pelvis with no splenomegaly, mild surface lobulation of the liver.  At her last visit she stated she had come off all opiates.  Having constipation and chronic abdominal pain related to several previous abdominal surgeries.  No hematochezia or melena.  Does have some episodic confusion and feel that is new, but has significant mental health issues.  No other hepatic symptoms.  Chronic THC use for chronic pain.  Recent labs did show thrombocytopenia indicative of portal hypertension.  Recommended colonoscopy because she is overdue and complaining of small stools with constipation, upper endoscopy for variceal screening.  Also recommended updated labs, Linzess 72 mcg samples provided, referral to the liver clinic in [place].  An initial appointment was scheduled with the liver clinic in [place].  The patient called our office stating [facility] worked and a prescription was sent to her pharmacy.  Colonoscopy completed 01/27/2017 which found redundant left colon, diverticulosis in the rectosigmoid colon, sigmoid colon, descending colon, and into the cecum.  Otherwise normal.  Recommended repeat in 10 years.  EGD completed the same day found examined esophagus  normal, diffuse moderate inflammation/moderate gastritis status post biopsy.  Surgical pathology found the biopsies to be reactive gastropathy/chemical gastritis negative for H. pylori.  Recommended repeat EGD in 2 to 3 years for variceal screening.  Today she states   Past Medical History:  Diagnosis Date  . Angiomyolipoma    Left kidney  . Anxiety   . Arthritis   . Attention to urostomy [facility])   . Chronic pain   . Depression   . Hepatitis C    ? Contracted through [facility]  . Panic 04/29/1999  . PTSD (post-traumatic stress disorder)   . Substance abuse ([place])    Remote history- cocaine, ETOH, Marijuana  . Thyroid disease     Past Surgical History:  Procedure Laterality Date  . ABDOMINAL HYSTERECTOMY    . ABDOMINAL SURGERY    . BIOPSY  01/27/2017   Procedure: BIOPSY;  Surgeon: Danie Binder, MD;  Location: [facility];  Service: Endoscopy;;  gastric  . CHOLECYSTECTOMY    . COLONOSCOPY WITH PROPOFOL N/A 01/27/2017   Procedure: COLONOSCOPY WITH PROPOFOL;  Surgeon: Danie Binder, MD;  Location: [facility];  Service: Endoscopy;  Laterality: N/A;  12:00pm  . ESOPHAGOGASTRODUODENOSCOPY (EGD) WITH PROPOFOL N/A 01/27/2017   Procedure: ESOPHAGOGASTRODUODENOSCOPY (EGD) WITH PROPOFOL;  Surgeon: Danie Binder, MD;  Location: [facility];  Service: Endoscopy;  Laterality: N/A;  . HERNIA REPAIR     x2- abdomen  . ILEO LOOP CONDUIT    . 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 orthopedic surgeries     multiple due to congenital  abnormalities, pelvic deformities  . VAGINA RECONSTRUCTION SURGERY      Current Outpatient Medications  Medication Sig Dispense Refill  . ALPRAZolam (XANAX) 1 MG tablet Take 1 tablet (1 mg total) by mouth 3 (three) times daily. 90 tablet 2  . ciprofloxacin (CIPRO) 500 MG tablet Take 1 tablet (500 mg total) by mouth 2 (two) times daily. 14 tablet 0  . Cyanocobalamin (B-12) 500 MCG TABS Take 1 tablet by mouth daily.    . DULoxetine  (CYMBALTA) 60 MG capsule Take 1 capsule (60 mg total) by mouth daily. 30 capsule 2  . HARVONI 90-400 MG TABS Take 1 tablet by mouth daily.   2  . hydrOXYzine (ATARAX/VISTARIL) 25 MG tablet Take 1 tablet (25 mg total) by mouth every 6 (six) hours as needed for anxiety. 90 tablet 2  . linaclotide (LINZESS) 72 MCG capsule Take 1 capsule (72 mcg total) by mouth daily before breakfast. 90 capsule 3  . Multiple Vitamins-Minerals (CENTRUM SILVER PO) Take 1 tablet by mouth daily.    Marland Kitchen neomycin-bacitracin-polymyxin (NEOSPORIN) ointment Apply 1 application topically as needed for wound care. apply to eye    . tetrahydrozoline 0.05 % ophthalmic solution Place 1 drop into both eyes daily.    . traZODone (DESYREL) 100 MG tablet Take 2 tablets (200 mg total) by mouth at bedtime as needed for sleep. 60 tablet 2   No current facility-administered medications for this visit.     Allergies as of 09/28/2017 - Review Complete 08/24/2017  Allergen Reaction Noted  . Oxycodone Anxiety 03/05/2012  . Abilify [aripiprazole]  06/08/2013  . Bactrim [sulfamethoxazole-trimethoprim]  12/01/2011  . Gabapentin  12/01/2011  . Ampicillin Rash 12/01/2011    Family History  Adopted: Yes  Problem Relation Age of Onset  . Depression Mother   . Depression Sister   . Anxiety disorder Sister   . Bipolar disorder Sister   . Depression Maternal Grandmother   . ADD / ADHD Neg Hx   . Alcohol abuse Neg Hx   . Drug abuse Neg Hx   . Dementia Neg Hx   . OCD Neg Hx   . Paranoid behavior Neg Hx   . Schizophrenia Neg Hx   . Seizures Neg Hx   . Sexual abuse Neg Hx   . Physical abuse Neg Hx   . Suicidality Neg Hx   . Colon cancer Neg Hx     Social History   Socioeconomic History  . Marital status: Married    Spouse name: Not on file  . Number of children: 0  . Years of education: Not on file  . Highest education level: Not on file  Occupational History  . Occupation: disability  Social Needs  . Financial resource  strain: Not on file  . Food insecurity:    Worry: Not on file    Inability: Not on file  . Transportation needs:    Medical: Not on file    Non-medical: Not on file  Tobacco Use  . Smoking status: Former Smoker    Packs/day: 0.25    Years: 33.00    Pack years: 8.25    Types: Cigarettes    Last attempt to quit: 01/21/2015    Years since quitting: 2.6  . Smokeless tobacco: Never Used  . Tobacco comment: 9-10 cigs a day as of 10/20/2012, (02-07-15 per pt, she stopped smoking 01-21-15)  Substance and Sexual Activity  . Alcohol use: No    Alcohol/week: 0.0 oz  . Drug use: Yes  Types: Marijuana    Comment: cocaine back in the 1980s; Marijuana rarely  . Sexual activity: Yes    Birth control/protection: Surgical  Lifestyle  . Physical activity:    Days per week: Not on file    Minutes per session: Not on file  . Stress: Not on file  Relationships  . Social connections:    Talks on phone: Not on file    Gets together: Not on file    Attends religious service: Not on file    Active member of club or organization: Not on file    Attends meetings of clubs or organizations: Not on file    Relationship status: Not on file  Other Topics Concern  . Not on file  Social History Narrative  . Not on file    Review of Systems: General: Negative for anorexia, weight loss, fever, chills, fatigue, weakness. Eyes: Negative for vision changes.  ENT: Negative for hoarseness, difficulty swallowing , nasal congestion. CV: Negative for chest pain, angina, palpitations, dyspnea on exertion, peripheral edema.  Respiratory: Negative for dyspnea at rest, dyspnea on exertion, cough, sputum, wheezing.  GI: See history of present illness. GU:  Negative for dysuria, hematuria, urinary incontinence, urinary frequency, nocturnal urination.  MS: Negative for joint pain, low back pain.  Derm: Negative for rash or itching.  Neuro: Negative for weakness, abnormal sensation, seizure, frequent headaches,  memory loss, confusion.  Psych: Negative for anxiety, depression, suicidal ideation, hallucinations.  Endo: Negative for unusual weight change.  Heme: Negative for bruising or bleeding. Allergy: Negative for rash or hives.   Physical Exam: There were no vitals taken for this visit. General:   Alert and oriented. Pleasant and cooperative. Well-nourished and well-developed.  Head:  Normocephalic and atraumatic. Eyes:  Without icterus, sclera clear and conjunctiva pink.  Ears:  Normal auditory acuity. Mouth:  No deformity or lesions, oral mucosa pink.  Throat/Neck:  Supple, without mass or thyromegaly. Cardiovascular:  S1, S2 present without murmurs appreciated. Normal pulses noted. Extremities without clubbing or edema. Respiratory:  Clear to auscultation bilaterally. No wheezes, rales, or rhonchi. No distress.  Gastrointestinal:  +BS, soft, non-tender and non-distended. No HSM noted. No guarding or rebound. No masses appreciated.  Rectal:  Deferred  Musculoskalatal:  Symmetrical without gross deformities. Normal posture. Skin:  Intact without significant lesions or rashes. Neurologic:  Alert and oriented x4;  grossly normal neurologically. Psych:  Alert and cooperative. Normal mood and affect. Heme/Lymph/Immune: No significant cervical adenopathy. No excessive bruising noted.    09/28/2017 7:56 AM   Disclaimer: This note was dictated with voice recognition software. Similar sounding words can inadvertently be transcribed and may not be corrected upon review.

## 2017-09-29 ENCOUNTER — Ambulatory Visit: Payer: Medicare Other | Admitting: Family Medicine

## 2017-10-05 ENCOUNTER — Ambulatory Visit: Payer: Medicare Other | Admitting: Gastroenterology

## 2017-10-06 ENCOUNTER — Ambulatory Visit: Payer: Medicare Other | Admitting: Family Medicine

## 2017-10-08 ENCOUNTER — Ambulatory Visit (INDEPENDENT_AMBULATORY_CARE_PROVIDER_SITE_OTHER): Payer: Medicare Other | Admitting: Licensed Clinical Social Worker

## 2017-10-08 ENCOUNTER — Encounter (HOSPITAL_COMMUNITY): Payer: Self-pay | Admitting: Licensed Clinical Social Worker

## 2017-10-08 DIAGNOSIS — F332 Major depressive disorder, recurrent severe without psychotic features: Secondary | ICD-10-CM | POA: Diagnosis not present

## 2017-10-08 NOTE — Progress Notes (Signed)
   THERAPIST PROGRESS NOTE  Session Time: 11:00 am -11:45 am  Participation Level: Active  Behavioral Response: CasualAlertEuthymic  Type of Therapy: Individual Therapy  Treatment Goals addressed: Coping  Interventions: CBT and Solution Focused  Summary: Kathleen Palmer is a 57 y.o. female who presents oriented x4 (person, place, situation, and object), distracted, casually groomed, causually dressed and cooperative to address depression. Patient has a history of medical issues and mental health treatment. She denies symptoms of mania. Patient denies suicidal and homicidal ideations. She denies psychosis including auditory and visual hallucinations. Patient admits to cannabis use. She is at low risk for lethality. Patient has been hospitalized 7 times due to suicidal ideations but states she has never attempted due to her religious faith.   Physical: Patient has been staying in bed a lot.   Spiritual/value: Patient has been praying when she has thoughts of suicide.  Relationships: Patient reported that her husband has been very supportive toward her during her relapse into depression. [name] sister is having a tube inserted in her stomach and that has upset the patient greatly.  Emotional/Mental/Behavior: Patient reported her sister's health has declined and she got very sad about it. Patient has felt "psychotic" for the past 3 weeks but denies psychosis. She has stopped doing yard work and chores around the house. She recognizes the change in the pattern of her behavior that leads to deeper depression. Patient has not gone back to drinking alcohol or abusing pills but is smoking marijuana. She is planning on seeing her sister in August. Patient agreed to start to do the small activities daily, challenge her irrational thinking, and avoid relapsing into alcohol use. Patient noted she has made some progress on her goals and has not been hospitalized since August.   Patient engaged in session.  She responded well to interventions. Patient continues to meet criteria for Major Depressive disorder, recurrent episode, severe with anxious distress. Patient will continue in outpatient therapy due to being the least restrictive service to meet her needs. Patient made moderate progress on her goals at this time.   Suicidal/Homicidal: Negativewithout intent/plan  Therapist Response: Therapist reviewed patient's recent thoughts and behaviors. Therapist utilized CBT to address depression. Therapist reviewed patient's goals.  Therapist processed patient's feelings to identify triggers for mood. Therapist discussed patterns that patient notices in her behavior that lead to depression. Therapist reviewed patient's goals.   Plan: Return again in 4 weeks.        Diagnosis: Axis I: Major depressive disorder, recurrent episode, severe with anxious distress    Axis II: No diagnosis    [name], LCSW 10/08/2017

## 2017-10-12 ENCOUNTER — Ambulatory Visit: Payer: Medicare Other | Admitting: Family Medicine

## 2017-10-13 ENCOUNTER — Ambulatory Visit: Payer: Medicare Other | Admitting: Family Medicine

## 2017-10-20 ENCOUNTER — Ambulatory Visit: Payer: Medicare Other | Admitting: Family Medicine

## 2017-10-22 ENCOUNTER — Encounter (HOSPITAL_COMMUNITY): Payer: Self-pay | Admitting: Licensed Clinical Social Worker

## 2017-10-22 ENCOUNTER — Ambulatory Visit (INDEPENDENT_AMBULATORY_CARE_PROVIDER_SITE_OTHER): Payer: Medicare Other | Admitting: Licensed Clinical Social Worker

## 2017-10-22 DIAGNOSIS — F332 Major depressive disorder, recurrent severe without psychotic features: Secondary | ICD-10-CM

## 2017-10-22 NOTE — Progress Notes (Signed)
   THERAPIST PROGRESS NOTE  Session Time: 11:00 am -11:45 am  Participation Level: Active  Behavioral Response: CasualAlertEuthymic  Type of Therapy: Individual Therapy  Treatment Goals addressed: Coping  Interventions: CBT and Solution Focused  Summary: Kathleen Palmer is a 57 y.o. female who presents oriented x4 (person, place, situation, and object), distracted, casually groomed, causually dressed and cooperative to address depression. Patient has a history of medical issues and mental health treatment. She denies symptoms of mania. Patient denies suicidal and homicidal ideations. She denies psychosis including auditory and visual hallucinations. Patient admits to cannabis use. She is at low risk for lethality. Patient has been hospitalized 7 times due to suicidal ideations but states she has never attempted due to her religious faith.   Physical: Patient has been cleaning more and has had an increase in pain.  Spiritual/value: Patient continues to pray daily. Relationships: [name] relationship with his sister brings her down. She also holds on to negative things that her mother said to her as a child.  Emotional/Mental/Behavior: Patient admitted that she didn't feel like coming to the session. She has been feeling bad physically and emotionally. Patient admitted that she drank alcohol two weeks ago and she is beating herself up for it. After discussion, patient understood that it is ok to mess up or make a mistake. Patient noted that her mother told her she was a failure and that she wouldn't amount to anything in life.   Patient engaged in session. She responded well to interventions. Patient continues to meet criteria for Major Depressive disorder, recurrent episode, severe with anxious distress. Patient will continue in outpatient therapy due to being the least restrictive service to meet her needs. Patient made moderate progress on her goals at this time.   Suicidal/Homicidal:  Negativewithout intent/plan  Therapist Response: Therapist reviewed patient's recent thoughts and behaviors. Therapist utilized CBT to address depression. Therapist reviewed patient's goals.  Therapist processed patient's feelings to identify triggers for mood. Therapist discussed with patient making mistakes or failing is different than being a failure (behavior or act versus person).   Plan: Return again in 4 weeks.        Diagnosis: Axis I: Major depressive disorder, recurrent episode, severe with anxious distress    Axis II: No diagnosis    [name], LCSW 10/22/2017

## 2017-10-26 ENCOUNTER — Ambulatory Visit (HOSPITAL_COMMUNITY): Payer: Medicare Other | Admitting: Psychiatry

## 2017-10-27 ENCOUNTER — Encounter: Payer: Self-pay | Admitting: Family Medicine

## 2017-10-27 ENCOUNTER — Other Ambulatory Visit: Payer: Self-pay

## 2017-10-27 ENCOUNTER — Ambulatory Visit (INDEPENDENT_AMBULATORY_CARE_PROVIDER_SITE_OTHER): Payer: Medicare Other | Admitting: Family Medicine

## 2017-10-27 VITALS — BP 126/80 | HR 82 | Temp 98.3°F | Resp 14 | Ht 63.0 in | Wt 186.0 lb

## 2017-10-27 DIAGNOSIS — Z932 Ileostomy status: Secondary | ICD-10-CM | POA: Diagnosis not present

## 2017-10-27 DIAGNOSIS — G894 Chronic pain syndrome: Secondary | ICD-10-CM

## 2017-10-27 DIAGNOSIS — K439 Ventral hernia without obstruction or gangrene: Secondary | ICD-10-CM | POA: Diagnosis not present

## 2017-10-27 NOTE — Assessment & Plan Note (Addendum)
She has significant reasons for chronic pain multiple pelvic.  She has had ventral hernia surgery as she has ileostomy however with her history last year with the abuse of her narcotic medication I do not feel comfortable restarting her on this and discussed this with both her husband and her.  If she were to be on narcotics she would need a pain clinic where they can follow her monthly and very closely along with her psychiatrist as she is also on alprazolam 3 times a day. Agrees to be seen by a pain clinic.

## 2017-10-27 NOTE — Progress Notes (Signed)
Subjective:    Patient ID: Kathleen Palmer, female    DOB: 08/10/1960, 57 y.o.   MRN: [id number]  Patient presents for Follow-up (is not fasting)  Pt here to f/u chronic medical problems Her husband is with her today, chronic pelvic pain hip pain secondary to multiple surgeries when she was younger.  She has ileostomy as well.  She was on chronic narcotics however was taking way too many was hospitalized last year her mental status was very cloudy this was worsening her depression status as well.  She finally was tapered off of medications under guidance and came to me stating that she did not want to go back on any type of narcotic.  She is actually done quite well until recently where she feels like when she gets up to move around she now has severe pain which is her previous chronic pain.  She is also had worsening of her ventral hernia which she would like to have evaluated.  Her husband is here to state that he would help with her pain medication.  Followed by [name] , had recent incrase  Hepatitis C has completed 12 weeks of harvoni, has appt end of this month   Chronic constipation on linzess        Review Of Systems:  GEN- denies fatigue, fever, weight loss,weakness, recent illness HEENT- denies eye drainage, change in vision, nasal discharge, CVS- denies chest pain, palpitations RESP- denies SOB, cough, wheeze ABD- denies N/V, change in stools, abd pain GU- denies dysuria, hematuria, dribbling, incontinence MSK- + joint pain, muscle aches, injury Neuro- denies headache, dizziness, syncope, seizure activity       Objective:    BP 126/80   Pulse 82   Temp 98.3 F (36.8 C) (Oral)   Resp 14   Ht 5\' 3"  (1.6 m)   Wt 186 lb (84.4 kg)   SpO2 96%   BMI 32.95 kg/m  GEN- NAD, alert and oriented x3,walks with cane  HEENT- PERRL, EOMI, non injected sclera, pink conjunctiva, MMM, oropharynx clear Neck- Supple,  CVS- RRR, no murmur RESP-CTAB ABD-NABS,soft,ostomy in tact,  right ventral hernia Psych- normal affect and mood  EXT- No edema Pulses- Radial 2+        Assessment & Plan:      Problem List Items Addressed This Visit      Unprioritized   Chronic pain syndrome - Primary    She has significant reasons for chronic pain multiple pelvic.  She has had ventral hernia surgery as she has ileostomy however with her history last year with the abuse of her narcotic medication I do not feel comfortable restarting her on this and discussed this with both her husband and her.  If she were to be on narcotics she would need a pain clinic where they can follow her monthly and very closely along with her psychiatrist as she is also on alprazolam 3 times a day. Agrees to be seen by a pain clinic.      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  I am been asked in her to [facility] to be evaluated for the hernia which she states is causing her more discomfort.  She did have a CT scan done the end of last year.         Note: This dictation was prepared with Dragon dictation along with smaller phrase technology. Any transcriptional errors that result from this process  are unintentional.

## 2017-10-27 NOTE — Patient Instructions (Addendum)
Referral to [facility] to hernia evaluation Referral to pain clinic  Continue current medications  F/U December

## 2017-10-27 NOTE — Assessment & Plan Note (Signed)
She has such a significant complex history.  I am been asked in her to [facility] to be evaluated for the hernia which she states is causing her more discomfort.  She did have a CT scan done the end of last year.

## 2017-11-11 ENCOUNTER — Ambulatory Visit (INDEPENDENT_AMBULATORY_CARE_PROVIDER_SITE_OTHER): Payer: Medicare Other | Admitting: Psychiatry

## 2017-11-11 ENCOUNTER — Encounter (HOSPITAL_COMMUNITY): Payer: Self-pay | Admitting: Psychiatry

## 2017-11-11 VITALS — BP 151/85 | HR 74 | Resp 16 | Wt 183.4 lb

## 2017-11-11 DIAGNOSIS — R109 Unspecified abdominal pain: Secondary | ICD-10-CM

## 2017-11-11 DIAGNOSIS — R450 Nervousness: Secondary | ICD-10-CM

## 2017-11-11 DIAGNOSIS — Z87891 Personal history of nicotine dependence: Secondary | ICD-10-CM

## 2017-11-11 DIAGNOSIS — F4310 Post-traumatic stress disorder, unspecified: Secondary | ICD-10-CM | POA: Diagnosis not present

## 2017-11-11 DIAGNOSIS — F332 Major depressive disorder, recurrent severe without psychotic features: Secondary | ICD-10-CM

## 2017-11-11 DIAGNOSIS — Z818 Family history of other mental and behavioral disorders: Secondary | ICD-10-CM | POA: Diagnosis not present

## 2017-11-11 MED ORDER — HYDROXYZINE HCL 25 MG PO TABS: 25.0000 mg | ORAL_TABLET | Freq: Four times a day (QID) | ORAL | 2 refills | Status: DC | PRN

## 2017-11-11 MED ORDER — DULOXETINE HCL 60 MG PO CPEP: 60.0000 mg | ORAL_CAPSULE | Freq: Every day | ORAL | 2 refills | Status: DC

## 2017-11-11 MED ORDER — ALPRAZOLAM 1 MG PO TABS
1.0000 mg | ORAL_TABLET | Freq: Three times a day (TID) | ORAL | 2 refills | Status: DC
Start: 2017-11-11 — End: 2018-02-23

## 2017-11-11 MED ORDER — TRAZODONE HCL 100 MG PO TABS: 200.0000 mg | ORAL_TABLET | Freq: Every evening | ORAL | 2 refills | Status: DC | PRN
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
## 2017-11-19 DIAGNOSIS — B182 Chronic viral hepatitis C: Secondary | ICD-10-CM | POA: Diagnosis not present

## 2017-11-25 DIAGNOSIS — B182 Chronic viral hepatitis C: Secondary | ICD-10-CM | POA: Diagnosis not present

## 2017-11-25 DIAGNOSIS — K7469 Other cirrhosis of liver: Secondary | ICD-10-CM | POA: Diagnosis not present

## 2017-11-27 ENCOUNTER — Other Ambulatory Visit (HOSPITAL_COMMUNITY): Payer: Self-pay | Admitting: Nurse Practitioner

## 2017-11-27 DIAGNOSIS — R1011 Right upper quadrant pain: Secondary | ICD-10-CM

## 2017-12-03 ENCOUNTER — Encounter: Payer: Self-pay | Admitting: Family Medicine

## 2017-12-04 ENCOUNTER — Ambulatory Visit (HOSPITAL_COMMUNITY): Payer: Self-pay | Admitting: Licensed Clinical Social Worker

## 2017-12-23 ENCOUNTER — Ambulatory Visit (INDEPENDENT_AMBULATORY_CARE_PROVIDER_SITE_OTHER): Payer: Medicare Other | Admitting: Licensed Clinical Social Worker

## 2017-12-23 ENCOUNTER — Encounter (HOSPITAL_COMMUNITY): Payer: Self-pay | Admitting: Licensed Clinical Social Worker

## 2017-12-23 DIAGNOSIS — F332 Major depressive disorder, recurrent severe without psychotic features: Secondary | ICD-10-CM | POA: Diagnosis not present
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
## 2017-12-31 ENCOUNTER — Ambulatory Visit: Payer: Medicare Other | Admitting: Gastroenterology

## 2018-01-06 ENCOUNTER — Encounter (HOSPITAL_COMMUNITY): Payer: Self-pay | Admitting: Licensed Clinical Social Worker

## 2018-01-06 ENCOUNTER — Ambulatory Visit (INDEPENDENT_AMBULATORY_CARE_PROVIDER_SITE_OTHER): Payer: Medicare Other | Admitting: Licensed Clinical Social Worker

## 2018-01-06 DIAGNOSIS — F332 Major depressive disorder, recurrent severe without psychotic features: Secondary | ICD-10-CM

## 2018-01-06 NOTE — Progress Notes (Signed)
   THERAPIST PROGRESS NOTE  Session Time: 9:50 am -10:20 am  Participation Level: Active  Behavioral Response: CasualAlertEuthymic  Type of Therapy: Individual Therapy  Treatment Goals addressed: Coping  Interventions: CBT and Solution Focused  Summary: Kathleen Palmer is a 57 y.o. female who presents oriented x4 (person, place, situation, and object), distracted, casually groomed, causually dressed and cooperative to address depression. Patient has a history of medical issues and mental health treatment. She denies symptoms of mania. Patient denies suicidal and homicidal ideations. She denies psychosis including auditory and visual hallucinations. Patient admits to cannabis use. She is at low risk for lethality. Patient has been hospitalized 7 times due to suicidal ideations but states she has never attempted due to her religious faith.   Physical: Patient has been sleeping well. She has had an increase in physical pain. Patient has also been smoking a lot of marijuana lately to manage mood and pain. Patient was "losing" her thoughts. She denies alcohol use or "pill" abuse.  Spiritual/value: Patient is spiritually healthy. Relationships:  No issues identified.  Emotional/Mental/Behavior: Patient was depressed. She feels like the feelings have increased. She didn't feel like attending the session and didn't feel like she had a lot to say. Patient admitted passive suicidal thoughts but denied plan or means. After discussion, patient understood that she needs to focus on the small things such as getting out of bed, maintaining her hygiene and doing small things around the house to keep it clean. Patient expressed a concern that her medicine was not benefiting her anymore.   Patient engaged in session. She responded well to interventions. Patient continues to meet criteria for Major Depressive disorder, recurrent episode, severe with anxious distress. Patient will continue in outpatient therapy due  to being the least restrictive service to meet her needs. Patient made moderate progress on her goals at this time.   Suicidal/Homicidal: Negativewithout intent/plan  Therapist Response: Therapist reviewed patient's recent thoughts and behaviors. Therapist utilized CBT to address depression. Therapist reviewed patient's goals.  Therapist processed patient's feelings to identify triggers for mood. Therapist assigned patient small "tasks" to help manage her depression.   Plan: Return again in 4 weeks.        Diagnosis: Axis I: Major depressive disorder, recurrent episode, severe with anxious distress    Axis II: No diagnosis    [name], LCSW 01/06/2018

## 2018-01-11 ENCOUNTER — Ambulatory Visit (HOSPITAL_COMMUNITY): Payer: Self-pay | Admitting: Psychiatry

## 2018-01-20 ENCOUNTER — Ambulatory Visit (HOSPITAL_COMMUNITY): Payer: Self-pay | Admitting: Licensed Clinical Social Worker

## 2018-02-01 ENCOUNTER — Ambulatory Visit (INDEPENDENT_AMBULATORY_CARE_PROVIDER_SITE_OTHER): Payer: Medicare Other | Admitting: Licensed Clinical Social Worker

## 2018-02-01 DIAGNOSIS — F332 Major depressive disorder, recurrent severe without psychotic features: Secondary | ICD-10-CM | POA: Diagnosis not present

## 2018-02-02 ENCOUNTER — Encounter (HOSPITAL_COMMUNITY): Payer: Self-pay | Admitting: Licensed Clinical Social Worker

## 2018-02-02 NOTE — Progress Notes (Signed)
   THERAPIST PROGRESS NOTE  Session Time: 10:00 am -10:40 am  Participation Level: Active  Behavioral Response: CasualAlertEuthymic  Type of Therapy: Individual Therapy  Treatment Goals addressed: Coping  Interventions: CBT and Solution Focused  Summary: Kathleen Palmer is a 56 y.o. female who presents oriented x4 (person, place, situation, and object), distracted, casually groomed, causually dressed and cooperative to address depression. Patient has a history of medical issues and mental health treatment. She denies symptoms of mania. Patient denies suicidal and homicidal ideations. She denies psychosis including auditory and visual hallucinations. Patient admits to cannabis use. She is at low risk for lethality. Patient has been hospitalized 7 times due to suicidal ideations but states she has never attempted due to her religious faith.   Physical: Patient has been sleeping a lot.   Spiritual/value: Patient continues to have spiritual health. Relationships:  No issues identified.  Emotional/Mental/Behavior: Patient is experiencing grief. Her twin sister passed away. She has been overwhelmed with sadness and grief. Patient was tearful and talked about her sister. After discussion, patient stated that her sister was no longer in pain and that she lived a good lift. Patient agreed to take very small steps including staying out of bed and not spending all of her time in bed.   Patient engaged in session. She responded well to interventions. Patient continues to meet criteria for Major Depressive disorder, recurrent episode, severe with anxious distress. Patient will continue in outpatient therapy due to being the least restrictive service to meet her needs. Patient made moderate progress on her goals at this time.   Suicidal/Homicidal: Negativewithout intent/plan  Therapist Response: Therapist reviewed patient's recent thoughts and behaviors. Therapist utilized CBT to address depression.  Therapist reviewed patient's goals.  Therapist processed patient's feelings to identify triggers for mood. Therapist discussed patient's sister passing away and grief.   Plan: Return again in 4 weeks.        Diagnosis: Axis I: Major depressive disorder, recurrent episode, severe with anxious distress    Axis II: No diagnosis    [name], LCSW 02/01/2018

## 2018-02-03 ENCOUNTER — Ambulatory Visit (HOSPITAL_COMMUNITY): Payer: Medicare Other | Attending: Nurse Practitioner

## 2018-02-12 ENCOUNTER — Ambulatory Visit (HOSPITAL_COMMUNITY): Payer: Self-pay | Admitting: Licensed Clinical Social Worker

## 2018-02-19 ENCOUNTER — Ambulatory Visit (HOSPITAL_COMMUNITY): Admission: RE | Admit: 2018-02-19 | Payer: Medicare Other | Source: Ambulatory Visit

## 2018-02-23 ENCOUNTER — Ambulatory Visit (INDEPENDENT_AMBULATORY_CARE_PROVIDER_SITE_OTHER): Payer: Medicare Other | Admitting: Psychiatry

## 2018-02-23 ENCOUNTER — Encounter (HOSPITAL_COMMUNITY): Payer: Self-pay | Admitting: Psychiatry

## 2018-02-23 VITALS — BP 155/85 | HR 82 | Ht 63.0 in | Wt 177.0 lb

## 2018-02-23 DIAGNOSIS — F332 Major depressive disorder, recurrent severe without psychotic features: Secondary | ICD-10-CM | POA: Diagnosis not present

## 2018-02-23 DIAGNOSIS — R45851 Suicidal ideations: Secondary | ICD-10-CM | POA: Diagnosis not present

## 2018-02-23 DIAGNOSIS — F4310 Post-traumatic stress disorder, unspecified: Secondary | ICD-10-CM

## 2018-02-23 DIAGNOSIS — F419 Anxiety disorder, unspecified: Secondary | ICD-10-CM

## 2018-02-23 MED ORDER — FLUOXETINE HCL 20 MG PO CAPS: ORAL_CAPSULE | ORAL | 2 refills | Status: DC

## 2018-02-23 MED ORDER — ALPRAZOLAM 1 MG PO TABS: 1.0000 mg | ORAL_TABLET | Freq: Three times a day (TID) | ORAL | 2 refills | Status: DC

## 2018-02-23 MED ORDER — HYDROXYZINE HCL 25 MG PO TABS: 25.0000 mg | ORAL_TABLET | Freq: Four times a day (QID) | ORAL | 2 refills | Status: DC | PRN

## 2018-02-23 MED ORDER — TRAZODONE HCL 100 MG PO TABS: 200.0000 mg | ORAL_TABLET | Freq: Every evening | ORAL | 2 refills | Status: DC | PRN

## 2018-02-23 NOTE — Progress Notes (Signed)
[facility] MD/PA/NP OP Progress Note  02/23/2018 11:32 AM Kathleen Palmer  MRN:  [id number]  Chief Complaint:  Chief Complaint    Depression; Anxiety; Follow-up     HPI: This patient is a 57 year old married white female who lives with her husband.  She is currently on disability.  She returns for follow-up for treatment of depression and anxiety.  The patient returns after 3 months.  She is very tearful and almost hysterical today.  She states that she is very depressed and the Cymbalta is not working anymore.  However she then mentions that her twin sister died about a month ago in [place].  The sister had ALS.  On top of this she was not allowed to come to the funeral because the sister's girlfriend arranged all of this and told her not to come.  When asked about suicidal ideation she says that time she is thought about killing herself and that her husband has a gun in the closet the she is thought about using.  I immediately brought the husband in from the waiting room and told him the gun had to be removed from the home today.  The patient stated that in truth she really does not had a loaded gun and would never use it but I still want the firearm removed and the husband agrees.  She denies any other type of suicidal ideation or plan.  The patient states that she wakes up crying every morning and after couple of hours it subsides.  This seems particularly worse since the sister died.  She thinks however that Prozac helped her more than any other antidepressant and wants to go back to it so we will start back at 20 and work up to 40 mg after 2 weeks.  Xanax hydroxyzine and trazodone are still helping with anxiety and sleep.  She denies any psychotic symptoms. Visit Diagnosis:    ICD-10-CM   1. Major depressive disorder, recurrent episode, severe with anxious distress ([place]) F33.2   2. PTSD (post-traumatic stress disorder) F43.10     Past Psychiatric History: Long-term outpatient treatment here,  several previous hospitalizations for depression and suicidal ideation  Past Medical History:  Past Medical History:  Diagnosis Date  . Angiomyolipoma    Left kidney  . Anxiety   . Arthritis   . Attention to urostomy [facility])   . Chronic pain   . Depression   . Hepatitis C    ? Contracted through [facility]  . Panic 04/29/1999  . PTSD (post-traumatic stress disorder)   . Substance abuse ([place])    Remote history- cocaine, ETOH, Marijuana  . Thyroid disease     Past Surgical History:  Procedure Laterality Date  . ABDOMINAL HYSTERECTOMY    . ABDOMINAL SURGERY    . BIOPSY  01/27/2017   Procedure: BIOPSY;  Surgeon: Danie Binder, MD;  Location: [facility];  Service: Endoscopy;;  gastric  . CHOLECYSTECTOMY    . COLONOSCOPY WITH PROPOFOL N/A 01/27/2017   Procedure: COLONOSCOPY WITH PROPOFOL;  Surgeon: Danie Binder, MD;  Location: [facility];  Service: Endoscopy;  Laterality: N/A;  12:00pm  . ESOPHAGOGASTRODUODENOSCOPY (EGD) WITH PROPOFOL N/A 01/27/2017   Procedure: ESOPHAGOGASTRODUODENOSCOPY (EGD) WITH PROPOFOL;  Surgeon: Danie Binder, MD;  Location: [facility];  Service: Endoscopy;  Laterality: N/A;  . HERNIA REPAIR     x2- abdomen  . ILEO LOOP CONDUIT    . 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 VAGINA RECONSTRUCTION SURGERY      Family Psychiatric History: See below  Family History:  Family History  Adopted: Yes  Problem Relation Age of Onset  . Depression Mother   . Depression Sister   . Anxiety disorder Sister   . Bipolar disorder Sister   . Depression Maternal Grandmother   . ADD / ADHD Neg Hx   . Alcohol abuse Neg Hx   . Drug abuse Neg Hx   . Dementia Neg Hx   . OCD Neg Hx   . Paranoid behavior Neg Hx   . Schizophrenia Neg Hx   . Seizures Neg Hx   . Sexual abuse Neg Hx   . Physical abuse Neg Hx   . Suicidality Neg Hx   . Colon cancer Neg  Hx     Social History:  Social History   Socioeconomic History  . Marital status: Married    Spouse name: Not on file  . Number of children: 0  . Years of education: Not on file  . Highest education level: Not on file  Occupational History  . Occupation: disability  Social Needs  . Financial resource strain: Not on file  . Food insecurity:    Worry: Not on file    Inability: Not on file  . Transportation needs:    Medical: Not on file    Non-medical: Not on file  Tobacco Use  . Smoking status: Former Smoker    Packs/day: 0.25    Years: 33.00    Pack years: 8.25    Types: Cigarettes    Last attempt to quit: 01/21/2015    Years since quitting: 3.0  . Smokeless tobacco: Never Used  . Tobacco comment: 9-10 cigs a day as of 10/20/2012, (02-07-15 per pt, she stopped smoking 01-21-15)  Substance and Sexual Activity  . Alcohol use: No    Alcohol/week: 0.0 standard drinks  . Drug use: Yes    Types: Marijuana    Comment: cocaine back in the 1980s; Marijuana rarely  . Sexual activity: Yes    Birth control/protection: Surgical  Lifestyle  . Physical activity:    Days per week: Not on file    Minutes per session: Not on file  . Stress: Not on file  Relationships  . Social connections:    Talks on phone: Not on file    Gets together: Not on file    Attends religious service: Not on file    Active member of club or organization: Not on file    Attends meetings of clubs or organizations: Not on file    Relationship status: Not on file  Other Topics Concern  . Not on file  Social History Narrative  . Not on file    Allergies:  Allergies  Allergen Reactions  . Oxycodone Anxiety  . Abilify [Aripiprazole]     Stiff neck  . Bactrim [Sulfamethoxazole-Trimethoprim]     Tongue swelled  . Gabapentin     Dizziness to the point she actually fell  . Ampicillin Rash    Has patient had a PCN reaction causing immediate rash, facial/tongue/throat swelling, SOB or lightheadedness  with hypotension: Unknown Has patient had a PCN reaction causing severe rash involving mucus membranes or skin necrosis: Unknown Has patient had a PCN reaction that required hospitalization: No Has patient had a PCN reaction occurring within the last 10 years: No If all of  the above answers are "NO", then may proceed with Cephalosporin use.      Metabolic Disorder Labs: Lab Results  Component Value Date   HGBA1C 5.2 12/05/2016   MPG 102.54 12/05/2016   MPG 117 (H) 10/23/2014   No results found for: PROLACTIN Lab Results  Component Value Date   CHOL 129 12/05/2016   TRIG 69 12/05/2016   HDL 45 12/05/2016   CHOLHDL 2.9 12/05/2016   VLDL 14 12/05/2016   LDLCALC 70 12/05/2016   LDLCALC 107 (H) 10/23/2014   Lab Results  Component Value Date   TSH 4.65 (H) 06/09/2017   TSH 6.90 (H) 01/19/2017    Therapeutic Level Labs: No results found for: LITHIUM No results found for: VALPROATE No components found for:  CBMZ  Current Medications: Current Outpatient Medications  Medication Sig Dispense Refill  . ALPRAZolam (XANAX) 1 MG tablet Take 1 tablet (1 mg total) by mouth 3 (three) times daily. 90 tablet 2  . Cyanocobalamin (B-12) 500 MCG TABS Take 1 tablet by mouth daily.    . hydrOXYzine (ATARAX/VISTARIL) 25 MG tablet Take 1 tablet (25 mg total) by mouth every 6 (six) hours as needed for anxiety. 90 tablet 2  . linaclotide (LINZESS) 72 MCG capsule Take 1 capsule (72 mcg total) by mouth daily before breakfast. 90 capsule 3  . Multiple Vitamins-Minerals (CENTRUM SILVER PO) Take 1 tablet by mouth daily.    Marland Kitchen neomycin-bacitracin-polymyxin (NEOSPORIN) ointment Apply 1 application topically as needed for wound care. apply to eye    . tetrahydrozoline 0.05 % ophthalmic solution Place 1 drop into both eyes daily.    . traZODone (DESYREL) 100 MG tablet Take 2 tablets (200 mg total) by mouth at bedtime as needed for sleep. 60 tablet 2  . FLUoxetine (PROZAC) 20 MG capsule Take one daily for 2  weeks, then increase to one twice a day 60 capsule 2   No current facility-administered medications for this visit.      Musculoskeletal: Strength & Muscle Tone: decreased Gait & Station: unsteady Patient leans: N/A  Psychiatric Specialty Exam: Review of Systems  Constitutional: Positive for malaise/fatigue.  Musculoskeletal: Positive for joint pain.  Psychiatric/Behavioral: Positive for depression. The patient is nervous/anxious.   All other systems reviewed and are negative.   Blood pressure (!) 155/85, pulse 82, height 5\' 3"  (1.6 m), weight 177 lb (80.3 kg), SpO2 95 %.Body mass index is 31.35 kg/m.  General Appearance: Casual and Fairly Groomed  Eye Contact:  Fair  Speech:  Clear and Coherent  Volume:  Increased  Mood:  Anxious and Depressed  Affect:  Depressed, Labile and Tearful  Thought Process:  Goal Directed  Orientation:  Full (Time, Place, and Person)  Thought Content: Rumination   Suicidal Thoughts:  Yes.  without intent/plan  Homicidal Thoughts:  No  Memory:  Immediate;   Good Recent;   Good Remote;   Good  Judgement:  Poor  Insight:  Lacking  Psychomotor Activity:  Decreased  Concentration:  Concentration: Fair and Attention Span: Fair  Recall:  Good  Fund of Knowledge: Good  Language: Good  Akathisia:  No  Handed:  Right  AIMS (if indicated): not done  Assets:  Communication Skills Desire for Improvement Resilience Social Support Talents/Skills  ADL's:  Intact  Cognition: WNL  Sleep:  Good   Screenings: AIMS     Admission (Discharged) from 12/03/2016 in [facility]  AIMS Total Score  0    AUDIT  Admission (Discharged) from 12/03/2016 in [facility]  Alcohol Use Disorder Identification Test Final Score (AUDIT)  0    MDI     Office Visit from 11/15/2015 in [facility]  Total Score (max 50)  24    PHQ2-9     Office Visit from  10/27/2017 in [facility] Office Visit from 01/19/2017 in [facility] Office Visit from 12/10/2016 in [facility] Office Visit from 10/17/2016 in [facility] Office Visit from 03/07/2014 in [facility]  PHQ-2 Total Score  0  4  4  2  4   PHQ-9 Total Score  -  17  15  8  14        Assessment and Plan: This patient is a 57 year old female with a history of depression and borderline personality traits.  Unfortunately she recently lost her sister her and her mood has deteriorated even further.  She claims that Cymbalta is not working but I think it has more to do with the death of the sister.  Nevertheless she would like to go back on Prozac and we will start with Prozac 20 mg daily for 2 weeks and then increase to 20 mg twice daily for depression.  Continue Xanax 1 mg 3 times daily for anxiety as well as hydroxyzine 25 mg every 6 hours as needed for anxiety and trazodone 200 mg at bedtime for sleep.  She will return to see me in 4 weeks and her husband has promised to remove all firearms from the home.   Levonne Spiller, MD 02/23/2018, 11:32 AM

## 2018-02-26 ENCOUNTER — Ambulatory Visit (INDEPENDENT_AMBULATORY_CARE_PROVIDER_SITE_OTHER): Payer: Medicare Other | Admitting: Licensed Clinical Social Worker

## 2018-02-26 ENCOUNTER — Encounter (HOSPITAL_COMMUNITY): Payer: Self-pay | Admitting: Licensed Clinical Social Worker

## 2018-02-26 ENCOUNTER — Ambulatory Visit (HOSPITAL_COMMUNITY)
Admission: RE | Admit: 2018-02-26 | Discharge: 2018-02-26 | Disposition: A | Discharge status: Home / Self Care | Payer: Medicare Other | Source: Ambulatory Visit | Attending: Nurse Practitioner | Admitting: Nurse Practitioner

## 2018-02-26 DIAGNOSIS — F332 Major depressive disorder, recurrent severe without psychotic features: Secondary | ICD-10-CM | POA: Diagnosis not present

## 2018-02-26 DIAGNOSIS — F419 Anxiety disorder, unspecified: Secondary | ICD-10-CM | POA: Diagnosis not present

## 2018-02-26 DIAGNOSIS — R1011 Right upper quadrant pain: Secondary | ICD-10-CM

## 2018-02-26 DIAGNOSIS — K760 Fatty (change of) liver, not elsewhere classified: Secondary | ICD-10-CM | POA: Diagnosis not present

## 2018-02-26 DIAGNOSIS — K7469 Other cirrhosis of liver: Secondary | ICD-10-CM | POA: Insufficient documentation

## 2018-02-26 NOTE — Progress Notes (Signed)
   THERAPIST PROGRESS NOTE  Session Time: 11:00 am -11:40 am  Participation Level: Active  Behavioral Response: CasualAlertEuthymic  Type of Therapy: Individual Therapy  Treatment Goals addressed: Coping  Interventions: CBT and Solution Focused  Summary: Kathleen Palmer is a 57 y.o. female who presents oriented x4 (person, place, situation, and object), distracted, casually groomed, causually dressed and cooperative to address depression. Patient has a history of medical issues and mental health treatment. She denies symptoms of mania. Patient denies suicidal and homicidal ideations. She denies psychosis including auditory and visual hallucinations. Patient admits to cannabis use. She is at low risk for lethality. Patient has been hospitalized 7 times due to suicidal ideations but states she has never attempted due to her religious faith.   Physical: Patient deals with constant chronic pain. She is going to have a surgery to repair some previous surgeries which caused physical pain. Patient is delaying her surgery due to dealing with grief. Spiritual/value: No issues identified. Relationships:  No issues identified.  Emotional/Mental/Behavior: Patient continues to grieve her twin sister. She feels very angry at her sister's partner who has not sent her any ashes or a death certificate. Patient stated she feels crazy. She threw herself in the floor and expressed anger and grief related to her sister to demonstrate how she acts "behind closed doors." Patient admitted to thoughts of suicide but stated her religous beliefs stop her. She contracted for safety and agreed that if her thoughts increase she will go to the hospital. Her husband is aware of her feelings. Patient was talking about her future, her future surgery, etc. She cried during session. She understood that her sister wouldn't want her to be sad or harm herself. She noted that the best way she can honor her sister is by living.    Patient engaged in session. She responded well to interventions. Patient continues to meet criteria for Major Depressive disorder, recurrent episode, severe with anxious distress. Patient will continue in outpatient therapy due to being the least restrictive service to meet her needs. Patient made moderate progress on her goals at this time.   Suicidal/Homicidal: Negativewithout intent/plan  Therapist Response: Therapist reviewed patient's recent thoughts and behaviors. Therapist utilized CBT to address depression. Therapist reviewed patient's goals.  Therapist processed patient's feelings to identify triggers for mood. Therapist discussed patient anger and grief. Therapist contracted for safety with patient.   Plan: Return again in 4 weeks.        Diagnosis: Axis I: Major depressive disorder, recurrent episode, severe with anxious distress    Axis II: No diagnosis    [name], LCSW 02/26/2018

## 2018-03-09 ENCOUNTER — Other Ambulatory Visit: Payer: Self-pay | Admitting: Family Medicine

## 2018-03-09 DIAGNOSIS — Z1231 Encounter for screening mammogram for malignant neoplasm of breast: Secondary | ICD-10-CM

## 2018-03-10 ENCOUNTER — Encounter (HOSPITAL_COMMUNITY): Payer: Self-pay | Admitting: Licensed Clinical Social Worker

## 2018-03-10 ENCOUNTER — Encounter (HOSPITAL_COMMUNITY): Payer: Self-pay | Admitting: *Deleted

## 2018-03-10 ENCOUNTER — Ambulatory Visit (INDEPENDENT_AMBULATORY_CARE_PROVIDER_SITE_OTHER): Payer: Medicare Other | Admitting: Licensed Clinical Social Worker

## 2018-03-10 ENCOUNTER — Inpatient Hospital Stay (HOSPITAL_COMMUNITY)
Admission: RE | Admit: 2018-03-10 | Discharge: 2018-03-19 | DRG: 885 | Disposition: A | Discharge status: Home / Self Care | Payer: Medicare Other | Attending: Psychiatry | Admitting: Psychiatry

## 2018-03-10 ENCOUNTER — Other Ambulatory Visit: Payer: Self-pay | Admitting: Registered Nurse

## 2018-03-10 ENCOUNTER — Other Ambulatory Visit: Payer: Self-pay

## 2018-03-10 DIAGNOSIS — R748 Abnormal levels of other serum enzymes: Secondary | ICD-10-CM

## 2018-03-10 DIAGNOSIS — G4700 Insomnia, unspecified: Secondary | ICD-10-CM | POA: Diagnosis not present

## 2018-03-10 DIAGNOSIS — Q899 Congenital malformation, unspecified: Secondary | ICD-10-CM

## 2018-03-10 DIAGNOSIS — Z881 Allergy status to other antibiotic agents status: Secondary | ICD-10-CM | POA: Diagnosis not present

## 2018-03-10 DIAGNOSIS — R7989 Other specified abnormal findings of blood chemistry: Secondary | ICD-10-CM | POA: Diagnosis present

## 2018-03-10 DIAGNOSIS — Z880 Allergy status to penicillin: Secondary | ICD-10-CM

## 2018-03-10 DIAGNOSIS — B182 Chronic viral hepatitis C: Secondary | ICD-10-CM | POA: Diagnosis present

## 2018-03-10 DIAGNOSIS — Z885 Allergy status to narcotic agent status: Secondary | ICD-10-CM | POA: Diagnosis not present

## 2018-03-10 DIAGNOSIS — F1290 Cannabis use, unspecified, uncomplicated: Secondary | ICD-10-CM | POA: Diagnosis present

## 2018-03-10 DIAGNOSIS — G894 Chronic pain syndrome: Secondary | ICD-10-CM | POA: Diagnosis present

## 2018-03-10 DIAGNOSIS — Z818 Family history of other mental and behavioral disorders: Secondary | ICD-10-CM

## 2018-03-10 DIAGNOSIS — Z87891 Personal history of nicotine dependence: Secondary | ICD-10-CM

## 2018-03-10 DIAGNOSIS — F332 Major depressive disorder, recurrent severe without psychotic features: Secondary | ICD-10-CM | POA: Diagnosis not present

## 2018-03-10 DIAGNOSIS — F419 Anxiety disorder, unspecified: Secondary | ICD-10-CM | POA: Diagnosis present

## 2018-03-10 DIAGNOSIS — Z90710 Acquired absence of both cervix and uterus: Secondary | ICD-10-CM

## 2018-03-10 DIAGNOSIS — Z888 Allergy status to other drugs, medicaments and biological substances status: Secondary | ICD-10-CM | POA: Diagnosis not present

## 2018-03-10 DIAGNOSIS — Z9049 Acquired absence of other specified parts of digestive tract: Secondary | ICD-10-CM

## 2018-03-10 DIAGNOSIS — F410 Panic disorder [episodic paroxysmal anxiety] without agoraphobia: Secondary | ICD-10-CM | POA: Diagnosis present

## 2018-03-10 DIAGNOSIS — E039 Hypothyroidism, unspecified: Secondary | ICD-10-CM | POA: Diagnosis not present

## 2018-03-10 DIAGNOSIS — Z634 Disappearance and death of family member: Secondary | ICD-10-CM

## 2018-03-10 DIAGNOSIS — F4000 Agoraphobia, unspecified: Secondary | ICD-10-CM | POA: Diagnosis present

## 2018-03-10 DIAGNOSIS — F4310 Post-traumatic stress disorder, unspecified: Secondary | ICD-10-CM | POA: Diagnosis present

## 2018-03-10 DIAGNOSIS — R45851 Suicidal ideations: Secondary | ICD-10-CM | POA: Diagnosis present

## 2018-03-10 DIAGNOSIS — E038 Other specified hypothyroidism: Secondary | ICD-10-CM | POA: Diagnosis present

## 2018-03-10 DIAGNOSIS — D696 Thrombocytopenia, unspecified: Secondary | ICD-10-CM | POA: Diagnosis not present

## 2018-03-10 DIAGNOSIS — D709 Neutropenia, unspecified: Secondary | ICD-10-CM | POA: Diagnosis not present

## 2018-03-10 DIAGNOSIS — M8580 Other specified disorders of bone density and structure, unspecified site: Secondary | ICD-10-CM | POA: Diagnosis present

## 2018-03-10 LAB — COMPREHENSIVE METABOLIC PANEL
ALBUMIN: 3.8 g/dL (ref 3.5–5.0)
ALT: 192 U/L — ABNORMAL HIGH (ref 0–44)
ANION GAP: 8 (ref 5–15)
AST: 244 U/L — AB (ref 15–41)
Alkaline Phosphatase: 84 U/L (ref 38–126)
BUN: 10 mg/dL (ref 6–20)
CHLORIDE: 101 mmol/L (ref 98–111)
CO2: 30 mmol/L (ref 22–32)
Calcium: 9.2 mg/dL (ref 8.9–10.3)
Creatinine, Ser: 0.85 mg/dL (ref 0.44–1.00)
GFR calc Af Amer: >60 mL/min (ref >60)
GFR calc non Af Amer: >60 mL/min (ref >60)
GLUCOSE: 139 mg/dL — AB (ref 70–99)
Potassium: 3.3 mmol/L — ABNORMAL LOW (ref 3.5–5.1)
SODIUM: 139 mmol/L (ref 135–145)
Total Bilirubin: 1.4 mg/dL — ABNORMAL HIGH (ref 0.3–1.2)
Total Protein: 7.4 g/dL (ref 6.5–8.1)

## 2018-03-10 LAB — LIPID PANEL
CHOL/HDL RATIO: 3.7 ratio
Cholesterol: 137 mg/dL (ref 0–200)
HDL: 37 mg/dL — AB (ref >40)
LDL CALC: 84 mg/dL (ref 0–99)
TRIGLYCERIDES: 78 mg/dL (ref <150)
VLDL: 16 mg/dL (ref 0–40)

## 2018-03-10 LAB — CBC
HCT: 45.9 % (ref 36.0–46.0)
Hemoglobin: 14.6 g/dL (ref 12.0–15.0)
MCH: 29 pg (ref 26.0–34.0)
MCHC: 31.8 g/dL (ref 30.0–36.0)
MCV: 91.1 fL (ref 80.0–100.0)
NRBC: 0 % (ref 0.0–0.2)
PLATELETS: 65 10*3/uL — AB (ref 150–400)
RBC: 5.04 MIL/uL (ref 3.87–5.11)
RDW: 14.9 % (ref 11.5–15.5)
WBC: 3.4 10*3/uL — AB (ref 4.0–10.5)

## 2018-03-10 LAB — TSH: TSH: 14.938 u[IU]/mL — AB (ref 0.350–4.500)

## 2018-03-10 LAB — ETHANOL: Alcohol, Ethyl (B): <10 mg/dL (ref <10)

## 2018-03-10 MED ORDER — IBUPROFEN 400 MG PO TABS
400.0000 mg | ORAL_TABLET | Freq: Four times a day (QID) | ORAL | Status: DC | PRN
  Administered 2018-03-10 – 2018-03-12 (×5): 400 mg via ORAL
  Filled 2018-03-10 (×5): qty 1

## 2018-03-10 MED ORDER — TRAZODONE HCL 50 MG PO TABS: 50.0000 mg | ORAL_TABLET | Freq: Every evening | ORAL | Status: DC | PRN

## 2018-03-10 MED ORDER — FLUOXETINE HCL 20 MG PO CAPS
20.0000 mg | ORAL_CAPSULE | Freq: Every day | ORAL | Status: DC
  Administered 2018-03-11: 20 mg via ORAL
  Filled 2018-03-10 (×2): qty 1

## 2018-03-10 MED ORDER — TRAZODONE HCL 100 MG PO TABS
100.0000 mg | ORAL_TABLET | Freq: Every evening | ORAL | Status: DC | PRN
  Administered 2018-03-10 – 2018-03-18 (×9): 100 mg via ORAL
  Filled 2018-03-10 (×9): qty 1

## 2018-03-10 MED ORDER — ALPRAZOLAM 1 MG PO TABS
1.0000 mg | ORAL_TABLET | Freq: Three times a day (TID) | ORAL | Status: DC | PRN
  Administered 2018-03-10 – 2018-03-17 (×20): 1 mg via ORAL
  Filled 2018-03-10 (×20): qty 1

## 2018-03-10 NOTE — Progress Notes (Signed)
Patient ID: Kathleen Palmer, female   DOB: [birth year], 57 y.o.   MRN: [id number]   D: Patient pleasant on approach tonight. Rested some in her bed after admitted. She reports that earlier she couldn't stop crying but has not cried in a couple of hours now. Patient very pleasant and smiling some. Eyes red from crying earlier. No active SI but has had some passive SI today.  A: Staff will monitor on q 15 minute checks, follow treatment plan, and give medications as ordered. R: Cooperative on the unit.

## 2018-03-10 NOTE — Progress Notes (Signed)
Admission note:  Patient is a 57 yo female that was a walk-in this afternoon.  She is admitted for suicidal ideation and depression.  Patient is grieving over the loss of her twin sister in September.  Patient states her twin died from [place].  She has hx of mental illness in her family.  She states her mother committed suicide and her sister attempted before she was diagnosed with ALS.  She is able to contract for safety on the unit.  Patient has urostomy and has supplies with her.  She is able to take care of her own needs.  Patient has medical hx of angiomyolipoma, arthritis, urostomy, chronic pain, hepititis C, PTSD.  She has a remote hx of cocaine, etoh and marijuana, thyroid disease.  Patient has also been on xanax 3 times a day for over 20 years.  She is complaining of chronic pain. Patient is tearful, sobbing at times. She is pleasant and appreciative of staff's assistance.  She reports no current use of alcohol or drugs.  She does reports smoking  "a couple of hits of weed" at night for chronic pain and sleep.  Patient was oriented to room and unit.
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
## 2018-03-10 NOTE — Progress Notes (Signed)
Patient ID: Kathleen Palmer, female   DOB: 1960-09-29, 57 y.o.   MRN: [id number] PER STATE REGULATIONS 482.30  THIS CHART WAS REVIEWED FOR MEDICAL NECESSITY WITH RESPECT TO THE [name] ADMISSION/DURATION OF STAY.  NEXT REVIEW DATE:03/14/18  Roma Schanz, RN, BSN CASE MANAGER

## 2018-03-10 NOTE — Tx Team (Signed)
Initial Treatment Plan 03/10/2018 6:06 PM Kathleen Palmer [id number]    PATIENT STRESSORS: Health problems Traumatic event   PATIENT STRENGTHS: Average or above average intelligence Communication skills General fund of knowledge Supportive family/friends   PATIENT IDENTIFIED PROBLEMS: Suicidal ideation to cut wrists  Ongoing grief issue/loss of twin recently  Health problems  Urostomy/Multiple surgeries  Hernia left side Depression/anxiety              DISCHARGE CRITERIA:  Medical problems require only outpatient monitoring Motivation to continue treatment in a less acute level of care Verbal commitment to aftercare and medication compliance  PRELIMINARY DISCHARGE PLAN: Outpatient therapy Return to previous living arrangement  PATIENT/FAMILY INVOLVEMENT: This treatment plan has been presented to and reviewed with the patient, Kathleen Palmer.  The patient and family have been given the opportunity to ask questions and make suggestions.  Zipporah Plants, RN 03/10/2018, 6:06 PM

## 2018-03-10 NOTE — Progress Notes (Signed)
   THERAPIST PROGRESS NOTE  Session Time: 11:00 am -11:40 am  Participation Level: Active  Behavioral Response: CasualAlertEuthymic  Type of Therapy: Family Therapy  Treatment Goals addressed: Coping  Interventions: CBT and Solution Focused  Summary: Kathleen Palmer is a 57 y.o. female who presents oriented x4 (person, place, situation, and object), distracted, casually groomed, causually dressed and cooperative to address depression. Patient has a history of medical issues and mental health treatment. She denies symptoms of mania. Patient denies suicidal and homicidal ideations. She denies psychosis including auditory and visual hallucinations. Patient admits to cannabis use. She is at low risk for lethality. Patient has been hospitalized 7 times due to suicidal ideations but states she has never attempted due to her religious faith.   Physical: Patient continues to have chronic pain.  Spiritual/value: Patient has a strong faith.  Relationships: Patient is getting along with others. She continues to grieve her sister.  Emotional/Mental/Behavior: Patient is overwhelmed with grief and sadness. She stated that she didn't want to be here anymore and admitted that her thoughts of suicide have increased. Patient and husband were instructed to take her to the hospital to be assessed.   Patient engaged in session. She responded well to interventions. Patient continues to meet criteria for Major Depressive disorder, recurrent episode, severe with anxious distress. Patient will continue in outpatient therapy due to being the least restrictive service to meet her needs. Patient made moderate progress on her goals at this time.   Suicidal/Homicidal: Negativewithout intent/plan  Therapist Response: Therapist reviewed patient's recent thoughts and behaviors. Therapist utilized CBT to address depression. Therapist reviewed patient's goals.  Therapist processed patient's feelings to identify triggers for  mood. Therapist assessed for safety and got patient to agree to go to the hospital.   Plan: Return again in 4 weeks.        Diagnosis: Axis I: Major depressive disorder, recurrent episode, severe with anxious distress    Axis II: No diagnosis    [name], LCSW 03/10/2018

## 2018-03-10 NOTE — H&P (Signed)
Behavioral Health Medical Screening Exam  Kathleen Palmer is an 57 y.o. female patient presents to [facility] as walk in with complaints of worsening depression and suicidal ideation since the death of her identical twin.  Patient unable to contract for safety  Total Time spent with patient: 30 minutes  Psychiatric Specialty Exam: Physical Exam  Vitals reviewed. Constitutional: She is oriented to person, place, and time. She appears well-developed and well-nourished.  Neck: Normal range of motion. Neck supple.  Respiratory: Effort normal.  Musculoskeletal: Normal range of motion.  Neurological: She is alert and oriented to person, place, and time.  Skin: Skin is warm and dry.  Psychiatric: Her speech is normal and behavior is normal. Her mood appears anxious. Thought content is not paranoid and not delusional. Cognition and memory are normal. She expresses impulsivity. She exhibits a depressed mood. She expresses suicidal ideation. She expresses no homicidal ideation.    Review of Systems  Psychiatric/Behavioral: Positive for depression and suicidal ideas. Negative for hallucinations.  All other systems reviewed and are negative.   Blood pressure (!) 147/74, pulse 75, temperature 98.4 F (36.9 C), resp. rate 16.There is no height or weight on file to calculate BMI.  General Appearance: Casual  Eye Contact:  Good  Speech:  Clear and Coherent and Normal Rate  Volume:  Normal  Mood:  Depressed  Affect:  Congruent, Depressed and Tearful  Thought Process:  Coherent and Goal Directed  Orientation:  Full (Time, Place, and Person)  Thought Content:  WDL  Suicidal Thoughts:  Yes.  without intent/plan  Homicidal Thoughts:  No  Memory:  Immediate;   Good Recent;   Good Remote;   Good  Judgement:  Impaired  Insight:  Lacking  Psychomotor Activity:  Normal  Concentration: Concentration: Good and Attention Span: Good  Recall:  Good  Fund of Knowledge:Good  Language: Good  Akathisia:  No   Handed:  Right  AIMS (if indicated):     Assets:  Communication Skills Desire for Improvement Housing Social Support  Sleep:       Musculoskeletal: Strength & Muscle Tone: within normal limits Gait & Station: normal Patient leans: N/A  Blood pressure (!) 147/74, pulse 75, temperature 98.4 F (36.9 C), resp. rate 16.  Recommendations:  Inpatient psychiatric treatment  Based on my evaluation the patient does not appear to have an emergency medical condition.   , NP 03/10/2018, 5:04 PM

## 2018-03-11 DIAGNOSIS — B182 Chronic viral hepatitis C: Secondary | ICD-10-CM

## 2018-03-11 DIAGNOSIS — D696 Thrombocytopenia, unspecified: Secondary | ICD-10-CM

## 2018-03-11 DIAGNOSIS — F332 Major depressive disorder, recurrent severe without psychotic features: Principal | ICD-10-CM

## 2018-03-11 DIAGNOSIS — E039 Hypothyroidism, unspecified: Secondary | ICD-10-CM

## 2018-03-11 LAB — URINALYSIS, COMPLETE (UACMP) WITH MICROSCOPIC
Bilirubin Urine: NEGATIVE
GLUCOSE, UA: NEGATIVE mg/dL
KETONES UR: NEGATIVE mg/dL
Leukocytes, UA: NEGATIVE
NITRITE: POSITIVE — AB
PH: 6.5 (ref 5.0–8.0)
PROTEIN: NEGATIVE mg/dL
Specific Gravity, Urine: <1.005 — ABNORMAL LOW (ref 1.005–1.030)

## 2018-03-11 LAB — RAPID URINE DRUG SCREEN, HOSP PERFORMED
Amphetamines: NOT DETECTED
BARBITURATES: NOT DETECTED
Benzodiazepines: POSITIVE — AB
Cocaine: NOT DETECTED
Opiates: NOT DETECTED
Tetrahydrocannabinol: POSITIVE — AB

## 2018-03-11 LAB — HEMOGLOBIN A1C
HEMOGLOBIN A1C: 5.2 % (ref 4.8–5.6)
MEAN PLASMA GLUCOSE: 102.54 mg/dL

## 2018-03-11 MED ORDER — FLUOXETINE HCL 20 MG PO CAPS
40.0000 mg | ORAL_CAPSULE | Freq: Every day | ORAL | Status: DC
  Administered 2018-03-12: 40 mg via ORAL
  Filled 2018-03-11 (×4): qty 2

## 2018-03-11 MED ORDER — POTASSIUM CHLORIDE CRYS ER 20 MEQ PO TBCR
20.0000 meq | EXTENDED_RELEASE_TABLET | Freq: Two times a day (BID) | ORAL | Status: AC
  Administered 2018-03-11 (×2): 20 meq via ORAL
  Filled 2018-03-11 (×2): qty 1

## 2018-03-11 NOTE — Consult Note (Signed)
Medical Consultation   Kathleen Palmer  KKX:[id number]  DOB: 1960/12/07  DOA: 03/10/2018  PCP: Alycia Rossetti, MD   Outpatient Specialists: Sees liver specialist in [place]   Requesting physician: Neita Garnet, psychiatry  Reason for consultation: Thrombocytopenia, hepatitis C history   History of Present Illness: Kathleen Palmer is an 57 y.o. female with past medical history of chronic hepatitis C, subclinical hypothyroidism who voluntarily presented to [facility] on the morning of 11/14 for worsening depression and suicidal ideation.  During patient's lab intake, she was noted to have some mild transaminitis, thrombocytopenia with a platelet count of 65 and a TSH of 14.9.  Hospitalist were called for further evaluation.   Review of Systems:  ROS Pt complains of feeling tired.  She complains of some worsening abdominal distention from a hernia  Pt denies any headache, vision changes, dysphagia, chest pain, palpitations, shortness of breath, wheeze, cough, hematuria, dysuria, constipation, diarrhea, focal extremity numbness weakness or pain.  Review of systems are otherwise negative.    Past Medical History: Past Medical History:  Diagnosis Date  . Angiomyolipoma    Left kidney  . Anxiety   . Arthritis   . Attention to urostomy [facility])   . Chronic pain   . Depression   . Hepatitis C    ? Contracted through [facility]DA  . Panic 04/29/1999  . PTSD (post-traumatic stress disorder)   . Substance abuse ([place])    Remote history- cocaine, ETOH, Marijuana  . Thyroid disease     Past Surgical History: Past Surgical History:  Procedure Laterality Date  . ABDOMINAL HYSTERECTOMY    . ABDOMINAL SURGERY    . BIOPSY  01/27/2017   Procedure: BIOPSY;  Surgeon: Danie Binder, MD;  [facility];  Service: Endoscopy;;  gastric  . CHOLECYSTECTOMY    . COLONOSCOPY WITH PROPOFOL N/A 01/27/2017   Procedure: COLONOSCOPY WITH PROPOFOL;  Surgeon:  Danie Binder, MD;  Location: [facility];  Service: Endoscopy;  Laterality: N/A;  12:00pm  . ESOPHAGOGASTRODUODENOSCOPY (EGD) WITH PROPOFOL N/A 01/27/2017   Procedure: ESOPHAGOGASTRODUODENOSCOPY (EGD) WITH PROPOFOL;  Surgeon: Danie Binder, MD;  [facility];  Service: Endoscopy;  Laterality: N/A;  . HERNIA REPAIR     x2- abdomen  . ILEO LOOP CONDUIT    . 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 VAGINA RECONSTRUCTION SURGERY       Allergies:   Allergies  Allergen Reactions  . Oxycodone Anxiety  . Abilify [Aripiprazole]     Stiff neck  . Bactrim [Sulfamethoxazole-Trimethoprim]     Tongue swelled  . Gabapentin     Dizziness to the point she actually fell  . Ampicillin Rash    Has patient had a PCN reaction causing immediate rash, facial/tongue/throat swelling, SOB or lightheadedness with hypotension: Unknown Has patient had a PCN reaction causing severe rash involving mucus membranes or skin necrosis: Unknown Has patient had a PCN reaction that required hospitalization: No Has patient had a PCN reaction occurring within the last 10 years: No If all of the above answers are "NO", then may proceed with Cephalosporin use.       Social History:  reports that she quit smoking about 3 years ago. Her smoking use included cigarettes. She has a 8.25 pack-year smoking  history. She has never used smokeless tobacco. She reports that she has current or past drug history. Drug: Marijuana. She reports that she does not drink alcohol.  Lives at home with husband, ambulates without assistance   Family History: Family History  Adopted: Yes  Problem Relation Age of Onset  . Depression Mother   . Depression Sister   . Anxiety disorder Sister   . Bipolar disorder Sister   . Depression Maternal Grandmother   . ADD / ADHD Neg Hx   . Alcohol abuse Neg Hx   . Drug  abuse Neg Hx   . Dementia Neg Hx   . OCD Neg Hx   . Paranoid behavior Neg Hx   . Schizophrenia Neg Hx   . Seizures Neg Hx   . Sexual abuse Neg Hx   . Physical abuse Neg Hx   . Suicidality Neg Hx   . Colon cancer Neg Hx      Physical Exam: Vitals:   03/10/18 1700 03/10/18 1803 03/11/18 0748  BP: (!) 147/74 135/74 117/77  Pulse: 75 65 86  Resp: 16 16   Temp: 98.4 F (36.9 C) 98.5 F (36.9 C)   TempSrc:  Oral   SpO2:  98%   Weight:  80.3 kg   Height:  5\' 3"  (1.6 m)     Constitutional: Alert and oriented x3, no acute distress Eyes: Sclera nonicteric, extraocular movements are intact ENMT: Normocephalic and atraumatic, mucous memories are moist Neck: Supple, no JVD CVS: Regular rate and rhythm, S1-S2 Respiratory: Clear to auscultation bilaterally Abdomen: Soft, nontender, ostomy noted on the right hand side.  Some abdominal distention on left secondary to hernia Musculoskeletal: : No clubbing or cyanosis or edema Neuro: No focal deficits Psych: Answers appropriately, calm Skin: No skin breaks, tears or lesions   Data reviewed:  I have personally reviewed following labs and imaging studies Labs:  CBC: Recent Labs  Lab 03/10/18 1835  WBC 3.4*  HGB 14.6  HCT 45.9  MCV 91.1  PLT 65*    Basic Metabolic Panel: Recent Labs  Lab 03/10/18 1835  NA 139  K 3.3*  CL 101  CO2 30  GLUCOSE 139*  BUN 10  CREATININE 0.85  CALCIUM 9.2   GFR Estimated Creatinine Clearance: 71.6 mL/min (by C-G formula based on SCr of 0.85 mg/dL). Liver Function Tests: Recent Labs  Lab 03/10/18 1835  AST 244*  ALT 192*  ALKPHOS 84  BILITOT 1.4*  PROT 7.4  ALBUMIN 3.8   No results for input(s): LIPASE, AMYLASE in the last 168 hours. No results for input(s): AMMONIA in the last 168 hours. Coagulation profile No results for input(s): INR, PROTIME in the last 168 hours.  Cardiac Enzymes: No results for input(s): CKTOTAL, CKMB, CKMBINDEX, TROPONINI in the last 168  hours. BNP: Invalid input(s): POCBNP CBG: No results for input(s): GLUCAP in the last 168 hours. D-Dimer No results for input(s): DDIMER in the last 72 hours. Hgb A1c Recent Labs    03/10/18 1835  HGBA1C 5.2   Lipid Profile Recent Labs    03/10/18 1835  CHOL 137  HDL 37*  LDLCALC 84  TRIG 78  CHOLHDL 3.7   Thyroid function studies Recent Labs    03/10/18 1835  TSH 14.938*   Anemia work up No results for input(s): VITAMINB12, FOLATE, FERRITIN, TIBC, IRON, RETICCTPCT in the last 72 hours. Urinalysis    Component Value Date/Time   COLORURINE YELLOW 03/10/2018 [place] 03/10/2018 1716  LABSPEC <1.005 (L) 03/10/2018 1716   PHURINE 6.5 03/10/2018 1716   GLUCOSEU NEGATIVE 03/10/2018 1716   HGBUR SMALL (A) 03/10/2018 1716   BILIRUBINUR NEGATIVE 03/10/2018 1716   KETONESUR NEGATIVE 03/10/2018 1716   PROTEINUR NEGATIVE 03/10/2018 1716   UROBILINOGEN 0.2 03/07/2015 0840   NITRITE POSITIVE (A) 03/10/2018 1716   LEUKOCYTESUR NEGATIVE 03/10/2018 1716     Microbiology No results found for this or any previous visit (from the past 240 hour(s)).     Inpatient Medications:   Scheduled Meds: . [START ON 03/12/2018] FLUoxetine  40 mg Oral Daily   Continuous Infusions:   Radiological Exams on Admission: No results found.  Impression/Recommendations Active Problems:   Subclinical hypothyroidism   Chronic hepatitis C ([place]): In review of patient's hospital records, she was [facility] to [facility] clinic here in [place] with her first appointment being October 2018.  She has been continued to be followed by them.  Initial treatment options did not work and patient has a follow-up visit in January.  She has good insight on this and has good follow-up.  She last saw her gastroenterologist less than a month ago and at that time underwent a screening liver ultrasound.I suspect her transaminitis may be in part from this.  They  are only mildly elevated and at this time, would not make any changes or follow these numbers.  Patient is good about keeping her follow-up appointments with her gastroenterologist    MDD (major depressive disorder), recurrent severe, without psychosis ([place]): As per psychiatry.    Thrombocytopenia ([place]): Suspect her thrombocytopenia is secondary to her liver disease.  No signs of active bleeding.  In review of old blood work, her platelet counts have been low in the past, although lower now.,  She has some active bleeding, would not recheck this.  Would avoid aspirin or other NSAIDs.  History of subclinical hypothyroidism: Had thyroid function studies done earlier this year.  At that time her TSH was only 4.7 and today at 14.9.  She was told that she has subclinical hypothyroidism (which is probably correct given that her free T4 was only 1.0 and her free T3 was low at 2.2.  Will recheck free T3 and free T4.  If these are off, we will plan to start Synthroid.    Thank you for this consultation.  Our [facility] hospitalist team will follow patient's thyroid further studies and follow-up accordingly.     Annita Brod M.D. [facility] [email or web address] Password [email or web address]  03/11/2018, 9:07 PM

## 2018-03-11 NOTE — BHH Group Notes (Signed)
Adult Psychoeducational Group Note  Date:  03/11/2018 Time:  10:57 AM  Group Topic/Focus:  Crisis Planning:   The purpose of this group is to help patients create a crisis plan for use upon discharge or in the future, as needed.  Participation Level:  Active  Participation Quality:  Appropriate  Affect:  Appropriate  Cognitive:  Alert  Insight: Appropriate  Engagement in Group:  Engaged  Modes of Intervention:  Discussion  Additional Comments:  Pt attended and participated in crisis intervention group facilitated by MHT [place] 03/11/2018, 10:57 AM

## 2018-03-11 NOTE — B[facility] Suicide Risk Assessment (Signed)
[facility] Admission Suicide Risk Assessment   Nursing information obtained from:  Patient Demographic factors:  Caucasian, Unemployed Current Mental Status:  Self-harm thoughts, Intention to act on suicide plan, Belief that plan would result in death Loss Factors:  Loss of significant relationship Historical Factors:  Family history of suicide Risk Reduction Factors:  Sense of responsibility to family, Living with another person, especially a relative  Total Time spent with patient: 45 minutes Principal Problem: MDD Diagnosis:   Patient Active Problem List   Diagnosis Date Noted  . Ventral hernia [K43.9] 10/27/2017  . Gastritis due to nonsteroidal anti-inflammatory drug [K29.60, T39.395A]   . Constipation [K59.00] 12/30/2016  . Substance induced mood disorder ([place]) [F19.94] 12/04/2016  . MDD (major depressive disorder), recurrent severe, without psychosis ([place]) [F33.2] 12/04/2016  . Family hx of ALS (amyotrophic lateral sclerosis) [Z82.0] 08/31/2015  . Chronic hepatitis C ([place]) [B18.2] 01/24/2014  . Osteopenia [M85.80] 12/02/2013  . OA (osteoarthritis) of knee [M17.10] 09/20/2013  . Clitoral irritation [N90.89] 08/01/2013  . Epidermoid cyst of skin [L72.0] 08/01/2013  . Atypical chest pain [R07.89] 03/25/2013  . Encounter for screening colonoscopy [Z12.11] 03/25/2013  . Knee pain [M25.569] 03/25/2013  . Marijuana use [F12.90] 07/20/2012  . Unspecified vitamin D deficiency [E55.9] 06/02/2012  . Pain [R52] 04/07/2012  . Elevated blood pressure (not hypertension) [R03.0] 04/06/2012  . 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 Subclinical hypothyroidism [E03.9] 02/06/2012  . Chronic pain syndrome [G89.4] 02/06/2012  . H/O vaginal surgery [Z98.890] 02/06/2012  . 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 Ileostomy status ([place]) [Z93.2] 02/06/2012  . 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 PTSD (post-traumatic stress disorder) [F43.10] 02/06/2012  . MDD (major depressive  disorder) ([place]) [F32.9] 02/06/2012   Subjective Data:   Continued Clinical Symptoms:  Alcohol Use Disorder Identification Test Final Score (AUDIT): 0 The "Alcohol Use Disorders Identification Test", Guidelines for Use in Primary Care, Second Edition.  World Pharmacologist [facility]). Score between 0-7:  no or low risk or alcohol related problems. Score between 8-15:  moderate risk of alcohol related problems. Score between 16-19:  high risk of alcohol related problems. Score 20 or above:  warrants further diagnostic evaluation for alcohol dependence and treatment.   CLINICAL FACTORS:  57 year old married, on disability,long history of depression, presents for worsening depression triggered by the death of her twin sister 2 months ago, endorses neuro-vegetative symptoms of depression, suicidal ideations.    Psychiatric Specialty Exam: Physical Exam  ROS  Blood pressure 117/77, pulse 86, temperature 98.5 F (36.9 C), temperature source Oral, resp. rate 16, height 5\' 3"  (1.6 m), weight 80.3 kg, SpO2 98 %.Body mass index is 31.35 kg/m.  See admit note MSE   COGNITIVE FEATURES THAT CONTRIBUTE TO RISK:  Closed-mindedness and Loss of executive function    SUICIDE RISK:   Moderate:  Frequent suicidal ideation with limited intensity, and duration, some specificity in terms of plans, no associated intent, good self-control, limited dysphoria/symptomatology, some risk factors present, and identifiable protective factors, including available and accessible social support.  PLAN OF CARE: Patient will be admitted to inpatient psychiatric unit for stabilization and safety. Will provide and encourage milieu participation. Provide medication management and maked adjustments as needed.  Will follow daily.    I certify that inpatient services furnished can reasonably be expected to improve the patient's condition.   Jenne Campus, MD 03/11/2018, 10:21 AM

## 2018-03-11 NOTE — Plan of Care (Signed)
  Problem: Education: Goal: Knowledge of [facility] Education information/materials will improve Outcome: Progressing Goal: Emotional status will improve Outcome: Progressing   Problem: Activity: Goal: Interest or engagement in activities will improve Outcome: Progressing Goal: Sleeping patterns will improve Outcome: Progressing   Problem: Health Behavior/Discharge Planning: Goal: Compliance with treatment plan for underlying cause of condition will improve Outcome: Progressing  D: [name] affect is brighter today; she is less tearful.  She rates her depression and anxiety as a 9; hopelessness as an 8.  She states the ibuprofen is "taking the edge" off her pain.  She has chronic pain bilateral hips.  She states she slept "very well" last night.  She states she is not suicidal "because I'm here."  Her energy level is low and her concentration is poor.  Patient is taking her xanax before meals.  She did request to see the chaplain today.  I called and left message.  Her main stressor is the grief over losing her twin sister to [place] in September.  A: Continue to monitor medication management and MD orders.  Safety checks completed every 15 minutes per protocol.  Offer support and encouragement as needed.  R: Patient is receptive to staff; her behavior is appropriate.
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
## 2018-03-11 NOTE — BHH Group Notes (Signed)
LCSW Group Therapy Note 03/11/2018 2:21 PM  Type of Therapy/Topic: Group Therapy: Emotion Regulation  Participation Level: Active   Description of Group:  The purpose of this group is to assist patients in learning to regulate negative emotions and experience positive emotions. Patients will be guided to discuss ways in which they have been vulnerable to their negative emotions. These vulnerabilities will be juxtaposed with experiences of positive emotions or situations, and patients will be challenged to use positive emotions to combat negative ones. Special emphasis will be placed on coping with negative emotions in conflict situations, and patients will process healthy conflict resolution skills.  Therapeutic Goals: 1. Patient will identify two positive emotions or experiences to reflect on in order to balance out negative emotions 2. Patient will label two or more emotions that they find the most difficult to experience 3. Patient will demonstrate positive conflict resolution skills through discussion and/or role plays  Summary of Patient Progress:  Kathleen Palmer was engaged and participated throughout the group session. Kathleen Palmer states that she continues to struggle with grief and sadness. Kathleen Palmer states that she recently lost her twin sister a month ago and that she experiences a range of emotions in regards to her untimely death.    Therapeutic Modalities:  Cognitive Behavioral Therapy Feelings Identification Dialectical Behavioral Therapy   Theresa Duty Clinical Social Worker

## 2018-03-11 NOTE — Progress Notes (Signed)
D: Pt was in dayroom upon initial approach.  Pt presents with depressed affect and mood.  Pt discussed the events leading to her hospitalization with Probation officer.  She reports "for two and a half months I cried and the doctor told me to come in."  Pt states "I'm going to be dealing with surgeries and I needed closure for my sister first."  Pt reports her identical died recently and "I found her."  Pt denies SI/HI, denies hallucinations, reports abdominal pain of 7/10.  Pt has been visible in milieu interacting with peers and staff appropriately.  Pt attended evening group.    A: Introduced self to pt.  Met with pt 1:1.  Actively listened to pt and offered support and encouragement.  PRN medication administered for pain and sleep.  Q15 minute safety checks maintained.  R: Pt is safe on the unit.  Pt is compliant with medications.  Pt verbally contracts for safety.  Will continue to monitor and assess.

## 2018-03-11 NOTE — BHH Suicide Risk Assessment (Addendum)
[facility] INPATIENT:  Family/Significant Other Suicide Prevention Education  Suicide Prevention Education:  Contact Attempts: Amadi Yoshino, husband [phone]) has been identified by the patient as the family member/significant other with whom the patient will be residing, and identified as the person(s) who will aid the patient in the event of a mental health crisis.  With written consent from the patient, two attempts were made to provide suicide prevention education, prior to and/or following the patient's discharge.  We were unsuccessful in providing suicide prevention education.  A suicide education pamphlet was given to the patient to share with family/significant other.  Date and time of first attempt:03/11/18 / 3:26pm Date and time of second attempt:  03/14/2018 1:15 PM   Suicide Prevention Education was reviewed thoroughly with patient, including risk factors, warning signs, and what to do.  Mobile Crisis services were described and that telephone number pointed out, with encouragement to patient to put this number in personal cell phone.  Brochure was provided to patient to share with natural supports.  Patient acknowledged the ways in which they are at risk, and how working through each of their issues can gradually start to reduce their risk factors.  Patient was encouraged to think of the information in the context of people in their own lives.  Patient denied having access to firearms  Patient verbalized understanding of information provided.  Patient endorsed a desire to live.       Marylee Floras 03/11/2018, 3:26 PM

## 2018-03-11 NOTE — BHH Group Notes (Signed)
Adult Psychoeducational Group Note  Date:  03/11/2018 Time:  8:42 AM  Group Topic/Focus:  Orientation:   The focus of this group is to educate the patient on the purpose and policies of crisis stabilization and provide a format to answer questions about their admission.  The group details unit policies and expectations of patients while admitted.  Participation Level:  Active  Participation Quality:  Appropriate  Affect:  Appropriate  Cognitive:  Alert  Insight: Appropriate  Engagement in Group:  Engaged  Modes of Intervention:  Orientation  Additional Comments:  Pt attended orientation group facilitated by MHT Loa Socks 03/11/2018, 8:42 AM

## 2018-03-12 LAB — CBC WITH DIFFERENTIAL/PLATELET
ABS IMMATURE GRANULOCYTES: 0.01 10*3/uL (ref 0.00–0.07)
Basophils Absolute: 0 10*3/uL (ref 0.0–0.1)
Basophils Relative: 0 %
EOS PCT: 1 %
Eosinophils Absolute: 0 10*3/uL (ref 0.0–0.5)
HEMATOCRIT: 43.1 % (ref 36.0–46.0)
HEMOGLOBIN: 13.9 g/dL (ref 12.0–15.0)
Immature Granulocytes: 0 %
LYMPHS ABS: 0.9 10*3/uL (ref 0.7–4.0)
LYMPHS PCT: 29 %
MCH: 30 pg (ref 26.0–34.0)
MCHC: 32.3 g/dL (ref 30.0–36.0)
MCV: 93.1 fL (ref 80.0–100.0)
MONO ABS: 0.3 10*3/uL (ref 0.1–1.0)
MONOS PCT: 8 %
NEUTROS ABS: 1.9 10*3/uL (ref 1.7–7.7)
Neutrophils Relative %: 62 %
Platelets: 60 10*3/uL — ABNORMAL LOW (ref 150–400)
RBC: 4.63 MIL/uL (ref 3.87–5.11)
RDW: 15 % (ref 11.5–15.5)
WBC: 3 10*3/uL — AB (ref 4.0–10.5)
nRBC: 0 % (ref 0.0–0.2)

## 2018-03-12 LAB — BASIC METABOLIC PANEL
Anion gap: 5 (ref 5–15)
BUN: 12 mg/dL (ref 6–20)
CHLORIDE: 110 mmol/L (ref 98–111)
CO2: 28 mmol/L (ref 22–32)
CREATININE: 1.01 mg/dL — AB (ref 0.44–1.00)
Calcium: 9.2 mg/dL (ref 8.9–10.3)
GFR calc Af Amer: >60 mL/min (ref >60)
GFR calc non Af Amer: 60 mL/min — ABNORMAL LOW (ref >60)
Glucose, Bld: 109 mg/dL — ABNORMAL HIGH (ref 70–99)
POTASSIUM: 4.4 mmol/L (ref 3.5–5.1)
Sodium: 143 mmol/L (ref 135–145)

## 2018-03-12 LAB — T4, FREE: Free T4: 0.98 ng/dL (ref 0.82–1.77)

## 2018-03-12 MED ORDER — LIDOCAINE 5 % EX PTCH
1.0000 | MEDICATED_PATCH | Freq: Every day | CUTANEOUS | Status: DC
  Administered 2018-03-12 – 2018-03-13 (×2): 1 via TRANSDERMAL
  Filled 2018-03-12 (×4): qty 1

## 2018-03-12 NOTE — Plan of Care (Signed)
  Problem: Activity: Goal: Interest or engagement in activities will improve Outcome: Progressing   Problem: Coping: Goal: Ability to verbalize frustrations and anger appropriately will improve Outcome: Progressing   D: Pt alert and oriented on the unit. Pt engaging with RN staff and other pts. Pt denies SI/HI, A/VH. Pt also participated during unit groups and activities. Pt is pleasant and cooperative. A: Education, support and encouragement provided, q15 minute safety checks remain in effect. Medications administered per MD orders. R: No reactions/side effects to medicine noted. Pt denies any concerns at this time, and verbally contracts for safety. Pt ambulating on the unit with no issues. Pt remains safe on and off the unit.

## 2018-03-12 NOTE — Tx Team (Signed)
Interdisciplinary Treatment and Diagnostic Plan Update  03/12/2018 Time of Session:  Kathleen Palmer MRN: [id number]  Principal Diagnosis: <principal problem not specified>  Secondary Diagnoses: Active Problems:   Subclinical hypothyroidism   Chronic hepatitis C ([place])   MDD (major depressive disorder), recurrent severe, without psychosis (HCC)   Thrombocytopenia (HCC)   Current Medications:  Current Facility-Administered Medications  Medication Dose Route Frequency Provider Last Rate Last Dose  . ALPRAZolam Duanne Moron) tablet 1 mg  1 mg Oral TID PRN Cobos, Myer Peer, MD   1 mg at 03/12/18 0756  . FLUoxetine (PROZAC) capsule 40 mg  40 mg Oral Daily Cobos, Myer Peer, MD   40 mg at 03/12/18 0753  . lidocaine (LIDODERM) 5 % 1 patch  1 patch Transdermal Daily Cobos, Myer Peer, MD   1 patch at 03/12/18 1159  . traZODone (DESYREL) tablet 100 mg  100 mg Oral QHS PRN Cobos, Myer Peer, MD   100 mg at 03/11/18 2103   PTA Medications: Medications Prior to Admission  Medication Sig Dispense Refill Last Dose  . ALPRAZolam (XANAX) 1 MG tablet Take 1 tablet (1 mg total) by mouth 3 (three) times daily. 90 tablet 2   . Cyanocobalamin (B-12) 500 MCG TABS Take 1 tablet by mouth daily.   Taking  . FLUoxetine (PROZAC) 20 MG capsule Take one daily for 2 weeks, then increase to one twice a day 60 capsule 2   . hydrOXYzine (ATARAX/VISTARIL) 25 MG tablet Take 1 tablet (25 mg total) by mouth every 6 (six) hours as needed for anxiety. 90 tablet 2   . linaclotide (LINZESS) 72 MCG capsule Take 1 capsule (72 mcg total) by mouth daily before breakfast. 90 capsule 3 Taking  . Multiple Vitamins-Minerals (CENTRUM SILVER PO) Take 1 tablet by mouth daily.   Taking  . traZODone (DESYREL) 100 MG tablet Take 2 tablets (200 mg total) by mouth at bedtime as needed for sleep. 60 tablet 2     Patient Stressors: Health problems Traumatic event  Patient Strengths: Average or above average intelligence Communication  skills General fund of knowledge Supportive family/friends  Treatment Modalities: Medication Management, Group therapy, Case management,  1 to 1 session with clinician, Psychoeducation, Recreational therapy.   Physician Treatment Plan for Primary Diagnosis: <principal problem not specified> Long Term Goal(s): Improvement in symptoms so as ready for discharge Improvement in symptoms so as ready for discharge   Short Term Goals: Ability to identify changes in lifestyle to reduce recurrence of condition will improve Ability to maintain clinical measurements within normal limits will improve Ability to identify changes in lifestyle to reduce recurrence of condition will improve Ability to verbalize feelings will improve Ability to disclose and discuss suicidal ideas Ability to demonstrate self-control will improve Ability to identify and develop effective coping behaviors will improve Ability to maintain clinical measurements within normal limits will improve  Medication Management: Evaluate patient's response, side effects, and tolerance of medication regimen.  Therapeutic Interventions: 1 to 1 sessions, Unit Group sessions and Medication administration.  Evaluation of Outcomes: Progressing  Physician Treatment Plan for Secondary Diagnosis: Active Problems:   Subclinical hypothyroidism   Chronic hepatitis C ([place])   MDD (major depressive disorder), recurrent severe, without psychosis ([place])   Thrombocytopenia ([place])  Long Term Goal(s): Improvement in symptoms so as ready for discharge Improvement in symptoms so as ready for discharge   Short Term Goals: Ability to identify changes in lifestyle to reduce recurrence of condition will improve Ability to maintain clinical measurements within  normal limits will improve Ability to identify changes in lifestyle to reduce recurrence of condition will improve Ability to verbalize feelings will improve Ability to disclose and discuss suicidal  ideas Ability to demonstrate self-control will improve Ability to identify and develop effective coping behaviors will improve Ability to maintain clinical measurements within normal limits will improve     Medication Management: Evaluate patient's response, side effects, and tolerance of medication regimen.  Therapeutic Interventions: 1 to 1 sessions, Unit Group sessions and Medication administration.  Evaluation of Outcomes: Progressing   RN Treatment Plan for Primary Diagnosis: <principal problem not specified> Long Term Goal(s): Knowledge of disease and therapeutic regimen to maintain health will improve  Short Term Goals: Ability to verbalize feelings will improve, Ability to disclose and discuss suicidal ideas and Ability to identify and develop effective coping behaviors will improve  Medication Management: RN will administer medications as ordered by provider, will assess and evaluate patient's response and provide education to patient for prescribed medication. RN will report any adverse and/or side effects to prescribing provider.  Therapeutic Interventions: 1 on 1 counseling sessions, Psychoeducation, Medication administration, Evaluate responses to treatment, Monitor vital signs and CBGs as ordered, Perform/monitor CIWA, COWS, AIMS and Fall Risk screenings as ordered, Perform wound care treatments as ordered.  Evaluation of Outcomes: Progressing   LCSW Treatment Plan for Primary Diagnosis: <principal problem not specified> Long Term Goal(s): Safe transition to appropriate next level of care at discharge, Engage patient in therapeutic group addressing interpersonal concerns.  Short Term Goals: Engage patient in aftercare planning with referrals and resources  Therapeutic Interventions: Assess for all discharge needs, 1 to 1 time with Social worker, Explore available resources and support systems, Assess for adequacy in community support network, Educate family and significant  other(s) on suicide prevention, Complete Psychosocial Assessment, Interpersonal group therapy.  Evaluation of Outcomes: Progressing   Progress in Treatment: Attending groups: Yes. Participating in groups: Yes. Taking medication as prescribed: Yes. Toleration medication: Yes. Family/Significant other contact made: No, will contact:  the patient's husband; CSW attempted one time Patient understands diagnosis: Yes. Discussing patient identified problems/goals with staff: Yes. Medical problems stabilized or resolved: Yes. Denies suicidal/homicidal ideation: Yes. Issues/concerns per patient self-inventory: No. Other:   New problem(s) identified: None   New Short Term/Long Term Goal(s): medication stabilization, elimination of SI thoughts, development of comprehensive mental wellness plan.    Patient Goals: "I want to eliminate suicidal thoughts and I want to realize that everyone passes away"  Discharge Plan or Barriers: Patient plans to discharge home with her husband and continue to follow up with [name] and Hinda Glatter at [facility] in [place].   Reason for Continuation of Hospitalization: Anxiety Depression Medication stabilization Suicidal ideation  Estimated Length of Stay: 3-5 days   Attendees: Patient: Kathleen Palmer  03/12/2018 12:17 PM  Physician: Dr. Neita Garnet, MD  03/12/2018 12:17 PM  Nursing: Elberta Fortis RN  03/12/2018 12:17 PM  RN Care Manager: Lars Pinks  03/12/2018 12:17 PM  Social Worker: Radonna Ricker, [place] 03/12/2018 12:17 PM  Recreational Therapist: Rhunette Croft 03/12/2018 12:17 PM  Other: Agustina Caroli, NP  03/12/2018 12:17 PM  Other:  03/12/2018 12:17 PM  Other: 03/12/2018 12:17 PM    Scribe for Treatment Team: Marylee Floras, [place] 03/12/2018 12:17 PM
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
## 2018-03-12 NOTE — [facility] Group Notes (Signed)
LCSW Group Therapy Note 03/12/2018 2:11 PM  Type of Therapy and Topic: Group Therapy: Avoiding Self-Sabotaging and Enabling Behaviors  Participation Level: Did Not Attend  Description of Group:  In this group, patients will learn how to identify obstacles, self-sabotaging and enabling behaviors, as well as: what are they, why do we do them and what needs these behaviors meet. Discuss unhealthy relationships and how to have positive healthy boundaries with those that sabotage and enable. Explore aspects of self-sabotage and enabling in yourself and how to limit these self-destructive behaviors in everyday life.  Therapeutic Goals: 1. Patient will identify one obstacle that relates to self-sabotage and enabling behaviors 2. Patient will identify one personal self-sabotaging or enabling behavior they did prior to admission 3. Patient will state a plan to change the above identified behavior 4. Patient will demonstrate ability to communicate their needs through discussion and/or role play.   Summary of Patient Progress:  Invited, chose not to attend.     Therapeutic Modalities:  Cognitive Behavioral Therapy Person-Centered Therapy Motivational Interviewing   [place] Clinical Social Worker

## 2018-03-12 NOTE — Progress Notes (Signed)
Recreation Therapy Notes  Date: 11.15.19 Time: 0930 Location: 300 Hall Dayroom  Group Topic: Stress Management  Goal Area(s) Addresses:  Patient will verbalize importance of using healthy stress management.  Patient will identify positive emotions associated with healthy stress management.   Intervention: Stress Management  Activity :  Guided Imagery.  LRT introduced the stress management technique of guided imagery.  LRT read a script that allowed patients to envision patients being on the beach enjoying the sounds of the waves.  Patients were to listen and follow along as script was read.  Education:  Stress Management, Discharge Planning.   Education Outcome: Acknowledges edcuation/In group clarification offered/Needs additional education  Clinical Observations/Feedback: Pt did not attend group.     Victorino Sparrow, LRT/CTRS         Ria Comment, Irfan Veal A 03/12/2018 11:13 AM

## 2018-03-12 NOTE — Progress Notes (Signed)
Chaplain support in response to pt request, spiritual care consult, RN referral.   Providing support around grief and loss, as Kathleen Palmer's identical twin sister died two months ago.   Provided pastoral presence, normalizing feelings, psycho-social ed around depression and grief, engaging in life review, honoring Kathleen Palmer's sister, recognizing values that Kathleen Palmer holds around her sister's passing, engaged in prayers around these values.       Kathleen Palmer reports she found her identical twin sister when they were 27.  The two had been adopted by different families.  Celebrated the closeness of their connection - noting the uniqueness of having an identical twin. 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  Kathleen Palmer recognizes ways her sister was caring for her prior to her death.  Spoke with chaplain about seeking help for depression is honoring sister's care for her.

## 2018-03-12 NOTE — Progress Notes (Signed)
[facility] MD Progress Note  03/12/2018 1:32 PM Kathleen Palmer  MRN:  [id number] Subjective: Patient reports persistent depression but acknowledges feeling better than she did prior to admission.  She describes persistent episodes of crying but less frequently than before.  She denies suicidal ideations. Currently denies medication side effects. Objective: I have discussed case with treatment team and have met with patient. 57 year old married, on disability,long history of depression, presents for worsening depression triggered by the death of her twin sister 2 months ago, endorses neuro-vegetative symptoms of depression, suicidal ideations.  Patient presents depressed, affect constricted but more reactive than on admission and not tearful today, smiles briefly at times, appropriately.  Continues to grieve recent death of her sister. Labs reviewed . K+ normalized to 4.4WBC 3.0, diff WNL, ANC 1.9. PLTs 60 K. T4 0.98 ( WNL) Appreciate hospitalist consultation .  Based on recommendations regarding thrombocytopenia, will avoid NSAID management for chronic pain. We discussed other options with patient. Agrees to Lidoderm patch locally to affected hip area.  She is visible in dayroom, becoming more interactive with peers, pleasant on approach, no disruptive or agitated behaviors. Tolerating Prozac/Alprazolam well thus far Principal Problem: MDD Diagnosis:   Patient Active Problem List   Diagnosis Date Noted  . Thrombocytopenia ([place]) [D69.6] 03/11/2018  . Ventral hernia [K43.9] 10/27/2017  . Gastritis due to nonsteroidal anti-inflammatory drug [K29.60, T39.395A]   . Constipation [K59.00] 12/30/2016  . Substance induced mood disorder ([place]) [F19.94] 12/04/2016  . MDD (major depressive disorder), recurrent severe, without psychosis ([place]) [F33.2] 12/04/2016  . Family hx of ALS (amyotrophic lateral sclerosis) [Z82.0] 08/31/2015  . Chronic hepatitis C ([place]) [B18.2] 01/24/2014  . Osteopenia [M85.80] 12/02/2013   . OA (osteoarthritis) of knee [M17.10] 09/20/2013  . Clitoral irritation [N90.89] 08/01/2013  . Epidermoid cyst of skin [L72.0] 08/01/2013  . Atypical chest pain [R07.89] 03/25/2013  . Encounter for screening colonoscopy [Z12.11] 03/25/2013  . Knee pain [M25.569] 03/25/2013  . Marijuana use [F12.90] 07/20/2012  . Unspecified vitamin D deficiency [E55.9] 06/02/2012  . Pain [R52] 04/07/2012  . Elevated blood pressure (not hypertension) [R03.0] 04/06/2012  . 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 Subclinical hypothyroidism [E03.9] 02/06/2012  . Chronic pain syndrome [G89.4] 02/06/2012  . H/O vaginal surgery [Z98.890] 02/06/2012  . 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 Ileostomy status ([place]) [Z93.2] 02/06/2012  . 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 PTSD (post-traumatic stress disorder) [F43.10] 02/06/2012  . MDD (major depressive disorder) ([place]) [F32.9] 02/06/2012   Total Time spent with patient: 20 minutes  Past Psychiatric History:   Past Medical History:  Past Medical History:  Diagnosis Date  . Angiomyolipoma    Left kidney  . Anxiety   . Arthritis   . Attention to urostomy [facility])   . Chronic pain   . Depression   . Hepatitis C    ? Contracted through IVDA  . Panic 04/29/1999  . PTSD (post-traumatic stress disorder)   . Substance abuse ([place])    Remote history- cocaine, ETOH, Marijuana  . Thyroid disease     Past Surgical History:  Procedure Laterality Date  . ABDOMINAL HYSTERECTOMY    . ABDOMINAL SURGERY    . BIOPSY  01/27/2017   Procedure: BIOPSY;  Surgeon: Danie Binder, MD;  [facility];  Service: Endoscopy;;  gastric  . CHOLECYSTECTOMY    . COLONOSCOPY WITH PROPOFOL N/A 01/27/2017   Procedure: COLONOSCOPY WITH PROPOFOL;  Surgeon: Danie Binder, MD;  Location: [facility];  Service: Endoscopy;  Laterality: N/A;  12:00pm  . ESOPHAGOGASTRODUODENOSCOPY (EGD) WITH PROPOFOL N/A 01/27/2017   Procedure:  ESOPHAGOGASTRODUODENOSCOPY (EGD) WITH PROPOFOL;  Surgeon: Danie Binder, MD;  Location[facility];  Service: Endoscopy;  Laterality: N/A;  . HERNIA REPAIR     x2- abdomen  . ILEO LOOP CONDUIT    . 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 VAGINA RECONSTRUCTION SURGERY     Family History:  Family History  Adopted: Yes  Problem Relation Age of Onset  . Depression Mother   . Depression Sister   . Anxiety disorder Sister   . Bipolar disorder Sister   . Depression Maternal Grandmother   . ADD / ADHD Neg Hx   . Alcohol abuse Neg Hx   . Drug abuse Neg Hx   . Dementia Neg Hx   . OCD Neg Hx   . Paranoid behavior Neg Hx   . Schizophrenia Neg Hx   . Seizures Neg Hx   . Sexual abuse Neg Hx   . Physical abuse Neg Hx   . Suicidality Neg Hx   . Colon cancer Neg Hx    Family Psychiatric  History:  Social History:  Social History   Substance and Sexual Activity  Alcohol Use No  . Alcohol/week: 0.0 standard drinks     Social History   Substance and Sexual Activity  Drug Use Yes  . Types: Marijuana   Comment: cocaine back in the 1980s; Marijuana rarely    Social History   Socioeconomic History  . Marital status: Married    Spouse name: Not on file  . Number of children: 0  . Years of education: Not on file  . Highest education level: Not on file  Occupational History  . Occupation: disability  Social Needs  . Financial resource strain: Not on file  . Food insecurity:    Worry: Not on file    Inability: Not on file  . Transportation needs:    Medical: Not on file    Non-medical: Not on file  Tobacco Use  . Smoking status: Former Smoker    Packs/day: 0.25    Years: 33.00    Pack years: 8.25    Types: Cigarettes    Last attempt to quit: 01/21/2015    Years since quitting: 3.1  . Smokeless tobacco: Never Used  . Tobacco comment: 9-10 cigs a day as of  10/20/2012, (02-07-15 per pt, she stopped smoking 01-21-15)  Substance and Sexual Activity  . Alcohol use: No    Alcohol/week: 0.0 standard drinks  . Drug use: Yes    Types: Marijuana    Comment: cocaine back in the 1980s; Marijuana rarely  . Sexual activity: Yes    Birth control/protection: Surgical  Lifestyle  . Physical activity:    Days per week: Not on file    Minutes per session: Not on file  . Stress: Not on file  Relationships  . Social connections:    Talks on phone: Not on file    Gets together: Not on file    Attends religious service: Not on file    Active member of club or organization: Not on file    Attends meetings of clubs or organizations: Not on file    Relationship status: Not on file  Other Topics Concern  . Not on file  Social History Narrative  .  Not on file   Additional Social History:    Pain Medications: see MAR Prescriptions: see MAR Over the Counter: see MAR History of alcohol / drug use?: No history of alcohol / drug abuse  Sleep: Fair  Appetite:  Fair  Current Medications: Current Facility-Administered Medications  Medication Dose Route Frequency Provider Last Rate Last Dose  . ALPRAZolam Duanne Moron) tablet 1 mg  1 mg Oral TID PRN Clarivel Callaway, Myer Peer, MD   1 mg at 03/12/18 0756  . FLUoxetine (PROZAC) capsule 40 mg  40 mg Oral Daily Mieshia Pepitone, Myer Peer, MD   40 mg at 03/12/18 0753  . lidocaine (LIDODERM) 5 % 1 patch  1 patch Transdermal Daily Nguyen Butler, Myer Peer, MD   1 patch at 03/12/18 1159  . traZODone (DESYREL) tablet 100 mg  100 mg Oral QHS PRN Kiele Heavrin, Myer Peer, MD   100 mg at 03/11/18 2103    Lab Results:  Results for orders placed or performed during the hospital encounter of 03/10/18 (from the past 48 hour(s))  Urinalysis, Complete w Microscopic     Status: Abnormal   Collection Time: 03/10/18  5:16 PM  Result Value Ref Range   Color, Urine YELLOW YELLOW   APPearance CLEAR CLEAR   Specific Gravity, Urine <1.005 (L) 1.005 - 1.030   pH  6.5 5.0 - 8.0   Glucose, UA NEGATIVE NEGATIVE mg/dL   Hgb urine dipstick SMALL (A) NEGATIVE   Bilirubin Urine NEGATIVE NEGATIVE   Ketones, ur NEGATIVE NEGATIVE mg/dL   Protein, ur NEGATIVE NEGATIVE mg/dL   Nitrite POSITIVE (A) NEGATIVE   Leukocytes, UA NEGATIVE NEGATIVE   Squamous Epithelial / LPF 0-5 0 - 5   WBC, UA 0-5 0 - 5 WBC/hpf   RBC / HPF 0-5 0 - 5 RBC/hpf   Bacteria, UA FEW (A) NONE SEEN    Comment: Performed at [facility], [place] [address], [place], [place] [address]  Urine rapid drug screen (hosp performed)not at [facility]     Status: Abnormal   Collection Time: 03/10/18  5:16 PM  Result Value Ref Range   Opiates NONE DETECTED NONE DETECTED   Cocaine NONE DETECTED NONE DETECTED   Benzodiazepines POSITIVE (A) NONE DETECTED   Amphetamines NONE DETECTED NONE DETECTED   Tetrahydrocannabinol POSITIVE (A) NONE DETECTED   Barbiturates NONE DETECTED NONE DETECTED    Comment: (NOTE) DRUG SCREEN FOR MEDICAL PURPOSES ONLY.  IF CONFIRMATION IS NEEDED FOR ANY PURPOSE, NOTIFY LAB WITHIN 5 DAYS. LOWEST DETECTABLE LIMITS FOR URINE DRUG SCREEN Drug Class                     Cutoff (ng/mL) Amphetamine and metabolites    1000 Barbiturate and metabolites    200 Benzodiazepine                 027 Tricyclics and metabolites     300 Opiates and metabolites        300 Cocaine and metabolites        300 THC                            50 Performed at [facility], [place] [address] [place], [place] [address]   CBC     Status: Abnormal   Collection Time: 03/10/18  6:35 PM  Result Value Ref Range   WBC 3.4 (L) 4.0 - 10.5 K/uL   RBC 5.04 3.87 - 5.11 MIL/uL  Hemoglobin 14.6 12.0 - 15.0 g/dL   HCT 45.9 36.0 - 46.0 %   MCV 91.1 80.0 - 100.0 fL   MCH 29.0 26.0 - 34.0 pg   MCHC 31.8 30.0 - 36.0 g/dL   RDW 14.9 11.5 - 15.5 %   Platelets 65 (L) 150 - 400 K/uL    Comment: REPEATED TO VERIFY PLATELET COUNT CONFIRMED BY SMEAR SPECIMEN CHECKED FOR  CLOTS Immature Platelet Fraction may be clinically indicated, consider ordering this additional test [id number]    nRBC 0.0 0.0 - 0.2 %    Comment: Performed at [facility], [place] [address] [place], [place] [address]  Comprehensive metabolic panel     Status: Abnormal   Collection Time: 03/10/18  6:35 PM  Result Value Ref Range   Sodium 139 135 - 145 mmol/L   Potassium 3.3 (L) 3.5 - 5.1 mmol/L   Chloride 101 98 - 111 mmol/L   CO2 30 22 - 32 mmol/L   Glucose, Bld 139 (H) 70 - 99 mg/dL   BUN 10 6 - 20 mg/dL   Creatinine, Ser 0.85 0.44 - 1.00 mg/dL   Calcium 9.2 8.9 - 10.3 mg/dL   Total Protein 7.4 6.5 - 8.1 g/dL   Albumin 3.8 3.5 - 5.0 g/dL   AST 244 (H) 15 - 41 U/L   ALT 192 (H) 0 - 44 U/L   Alkaline Phosphatase 84 38 - 126 U/L   Total Bilirubin 1.4 (H) 0.3 - 1.2 mg/dL   GFR calc non Af Amer >60 >60 mL/min   GFR calc Af Amer >60 >60 mL/min    Comment: (NOTE) The eGFR has been calculated using the CKD EPI equation. This calculation has not been validated in all clinical situations. eGFR's persistently <60 mL/min signify possible Chronic Kidney Disease.    Anion gap 8 5 - 15    Comment: Performed at [facility], [place] [address] [place], [place] [address]  Ethanol     Status: None   Collection Time: 03/10/18  6:35 PM  Result Value Ref Range   Alcohol, Ethyl (B) <10 <10 mg/dL    Comment: (NOTE) Lowest detectable limit for serum alcohol is 10 mg/dL. For medical purposes only. Performed at [facility], [place] [address], [place], [place] [address]   Hemoglobin A1c     Status: None   Collection Time: 03/10/18  6:35 PM  Result Value Ref Range   Hgb A1c MFr Bld 5.2 4.8 - 5.6 %    Comment: (NOTE) Pre diabetes:          5.7%-6.4% Diabetes:              >6.4% Glycemic control for   <7.0% adults with diabetes    Mean Plasma Glucose 102.54 mg/dL    Comment: Performed at [place] [address]  [place], [place] [address]  Lipid panel     Status: Abnormal   Collection Time: 03/10/18  6:35 PM  Result Value Ref Range   Cholesterol 137 0 - 200 mg/dL   Triglycerides 78 <150 mg/dL   HDL 37 (L) >40 mg/dL   Total CHOL/HDL Ratio 3.7 RATIO   VLDL 16 0 - 40 mg/dL   LDL Cholesterol 84 0 - 99 mg/dL    Comment:        Total Cholesterol/HDL:CHD Risk Coronary Heart Disease Risk Table  Men   Women  1/2 Average Risk   3.4   3.3  Average Risk       5.0   4.4  2 X Average Risk   9.6   7.1  3 X Average Risk  23.4   11.0        Use the calculated Patient Ratio above and the CHD Risk Table to determine the patient's CHD Risk.        ATP III CLASSIFICATION (LDL):  <100     mg/dL   Optimal  100-129  mg/dL   Near or Above                    Optimal  130-159  mg/dL   Borderline  160-189  mg/dL   High  >190     mg/dL   Very High Performed at [place] [address] [place], [place] [address]   TSH     Status: Abnormal   Collection Time: 03/10/18  6:35 PM  Result Value Ref Range   TSH 14.938 (H) 0.350 - 4.500 uIU/mL    Comment: Performed by a 3rd Generation assay with a functional sensitivity of <=0.01 uIU/mL. Performed at [facility], [place] [address], [place], [place] [address]   T4, free     Status: None   Collection Time: 03/12/18  6:23 AM  Result Value Ref Range   Free T4 0.98 0.82 - 1.77 ng/dL    Comment: (NOTE) Biotin ingestion may interfere with free T4 tests. If the results are inconsistent with the TSH level, previous test results, or the clinical presentation, then consider biotin interference. If needed, order repeat testing after stopping biotin. Performed at [facility], [place] [address] [place], [place] [address]   CBC with Differential/Platelet     Status: Abnormal   Collection Time: 03/12/18  6:23 AM  Result Value Ref Range   WBC 3.0 (L) 4.0 - 10.5 K/uL   RBC 4.63 3.87 - 5.11 MIL/uL   Hemoglobin 13.9  12.0 - 15.0 g/dL   HCT 43.1 36.0 - 46.0 %   MCV 93.1 80.0 - 100.0 fL   MCH 30.0 26.0 - 34.0 pg   MCHC 32.3 30.0 - 36.0 g/dL   RDW 15.0 11.5 - 15.5 %   Platelets 60 (L) 150 - 400 K/uL    Comment: REPEATED TO VERIFY Immature Platelet Fraction may be clinically indicated, consider ordering this additional test [id number] CONSISTENT WITH PREVIOUS RESULT    nRBC 0.0 0.0 - 0.2 %   Neutrophils Relative % 62 %   Neutro Abs 1.9 1.7 - 7.7 K/uL   Lymphocytes Relative 29 %   Lymphs Abs 0.9 0.7 - 4.0 K/uL   Monocytes Relative 8 %   Monocytes Absolute 0.3 0.1 - 1.0 K/uL   Eosinophils Relative 1 %   Eosinophils Absolute 0.0 0.0 - 0.5 K/uL   Basophils Relative 0 %   Basophils Absolute 0.0 0.0 - 0.1 K/uL   Immature Granulocytes 0 %   Abs Immature Granulocytes 0.01 0.00 - 0.07 K/uL    Comment: Performed at [facility], [place] [address] [place], [place] [address]  Basic metabolic panel     Status: Abnormal   Collection Time: 03/12/18  6:23 AM  Result Value Ref Range   Sodium 143 135 - 145 mmol/L   Potassium 4.4 3.5 - 5.1 mmol/L    Comment: DELTA CHECK NOTED REPEATED TO VERIFY NO VISIBLE HEMOLYSIS  Chloride 110 98 - 111 mmol/L   CO2 28 22 - 32 mmol/L   Glucose, Bld 109 (H) 70 - 99 mg/dL   BUN 12 6 - 20 mg/dL   Creatinine, Ser 1.01 (H) 0.44 - 1.00 mg/dL   Calcium 9.2 8.9 - 10.3 mg/dL   GFR calc non Af Amer 60 (L) >60 mL/min   GFR calc Af Amer >60 >60 mL/min    Comment: (NOTE) The eGFR has been calculated using the CKD EPI equation. This calculation has not been validated in all clinical situations. eGFR's persistently <60 mL/min signify possible Chronic Kidney Disease.    Anion gap 5 5 - 15    Comment: Performed at [facility], [place] [address], [place], [place] [address]    Blood Alcohol level:  Lab Results  Component Value Date   [facility] <10 03/10/2018   ETH <5 48/18/5631    Metabolic Disorder Labs: Lab Results  Component Value Date    HGBA1C 5.2 03/10/2018   MPG 102.54 03/10/2018   MPG 102.54 12/05/2016   No results found for: PROLACTIN Lab Results  Component Value Date   CHOL 137 03/10/2018   TRIG 78 03/10/2018   HDL 37 (L) 03/10/2018   CHOLHDL 3.7 03/10/2018   VLDL 16 03/10/2018   LDLCALC 84 03/10/2018   LDLCALC 70 12/05/2016    Physical Findings: AIMS: Facial and Oral Movements Muscles of Facial Expression: None, normal Lips and Perioral Area: None, normal Jaw: None, normal Tongue: None, normal,Extremity Movements Upper (arms, wrists, hands, fingers): None, normal Lower (legs, knees, ankles, toes): None, normal, Trunk Movements Neck, shoulders, hips: None, normal, Overall Severity Severity of abnormal movements (highest score from questions above): None, normal Incapacitation due to abnormal movements: None, normal [name] awareness of abnormal movements (rate only patient's report): No Awareness, Dental Status Current problems with teeth and/or dentures?: No Does patient usually wear dentures?: No  CIWA:    COWS:     Musculoskeletal: Strength & Muscle Tone: within normal limits Gait & Station: affected by chronic pain- steady Patient leans: N/A  Psychiatric Specialty Exam: Physical Exam  ROS no headaches, no chest pain, no shortness of breath, reports chronic pain affecting mostly her hip area and also some chronic abdominal pain.  Denies vomiting, no diarrhea.  Blood pressure 131/82, pulse 79, temperature 98.2 F (36.8 C), temperature source Oral, resp. rate 16, height '5\' 3"'$  (1.6 m), weight 80.3 kg, SpO2 98 %.Body mass index is 31.35 kg/m.  General Appearance: Improving grooming  Eye Contact:  Good  Speech:  Normal Rate  Volume:  Normal  Mood:  remains depressed, but partially improved compared to admission  Affect:  Still constricted but becoming more reactive, smiles at times appropriately  Thought Process:  Linear and Descriptions of Associations: Intact  Orientation:  Other:  Fully  alert and attentive  Thought Content:  No hallucinations, no delusions expressed  Suicidal Thoughts:  No-denies suicidal or self-injurious ideations, no homicidal or violent ideations, currently contracts for safety on unit  Homicidal Thoughts:  No  Memory:  Recent and remote grossly intact  Judgement:  Other:  Improving  Insight:  Improving  Psychomotor Activity:  Normal-no psychomotor agitation or restlessness  Concentration:  Concentration: Good and Attention Span: Good  Recall:  Good  Fund of Knowledge:  Good  Language:  Good  Akathisia:  Negative  Handed:  Right  AIMS (if indicated):     Assets:  Communication Skills Desire for Improvement Resilience  ADL's:  Intact  Cognition:  WNL  Sleep:  Number of Hours: 5.75   Assessment -  57 year old married, on disability,long history of depression, presents for worsening depression triggered by the death of her twin sister 2 months ago, endorses neuro-vegetative symptoms of depression, suicidal ideations.  Patient presents depressed, affect constricted but more reactive than on admission and not tearful today, smiles briefly at times, appropriately.  Continues to grieve recent death of her sister.  Patient remains depressed, but today affect is less severely constricted and becoming more reactive.  Denies suicidal ideations at this time.  Is noted to be more visible in milieu and interacting with peers more.  She does continue to ruminate and to grieve the death of her sister.  Hospitalist consultant following for thrombocytopenia, elevated transaminases, elevated TSH.  Currently on Prozac which she is tolerating well thus far.  Also on Xanax which she states she has been on for years.  Treatment Plan Summary: Daily contact with patient to assess and evaluate symptoms and progress in treatment, Medication management, Plan Inpatient treatment and Medications as below Encourage group and milieu participation to work on coping skills and  symptom reduction Continue Prozac 40 mg daily for depression Start Lidocaine patch topically for pain Continue Xanax 1 mg 3 times daily PRN for anxiety Continue Trazodone 100 mg nightly PRN for insomnia Spiritual care/chaplain consultation requested at patient's request Jenne Campus, MD 03/12/2018, 1:32 PM

## 2018-03-12 NOTE — Progress Notes (Signed)
D: Pt was in dayroom upon initial approach.  Pt presents with anxious, depressed affect and mood.  She states "I got high anxiety."  Pt reports her goal tonight is "not to cry."  She reports the best part of her day was "listening to 80's music" with peers.  She states "makes me feel young."  Pt denies SI/HI, denies hallucinations, reports R hip pain of 9/10.  Pt has been visible in milieu interacting with peers and staff appropriately.    A: Met with pt 1:1.  Actively listened to pt and offered support and encouragement.  PRN medication administered for anxiety and sleep.  Heat packs provided for pain with some alleviation.  Pt encouraged to rest.  Q15 minute safety checks maintained.  R: Pt is safe on the unit.  Pt is compliant with medications.  Pt verbally contracts for safety.  Will continue to monitor and assess.

## 2018-03-13 DIAGNOSIS — F419 Anxiety disorder, unspecified: Secondary | ICD-10-CM

## 2018-03-13 DIAGNOSIS — G4700 Insomnia, unspecified: Secondary | ICD-10-CM

## 2018-03-13 DIAGNOSIS — R45851 Suicidal ideations: Secondary | ICD-10-CM

## 2018-03-13 LAB — T3, FREE: T3, Free: 3.5 pg/mL (ref 2.0–4.4)

## 2018-03-13 MED ORDER — ACETAMINOPHEN 500 MG PO TABS
ORAL_TABLET | ORAL | Status: AC
  Filled 2018-03-13: qty 1

## 2018-03-13 MED ORDER — ACETAMINOPHEN 500 MG PO TABS
500.0000 mg | ORAL_TABLET | Freq: Four times a day (QID) | ORAL | Status: DC | PRN
  Administered 2018-03-13 – 2018-03-14 (×4): 500 mg via ORAL
  Filled 2018-03-13 (×3): qty 1

## 2018-03-13 MED ORDER — FLUOXETINE HCL 20 MG PO CAPS
60.0000 mg | ORAL_CAPSULE | Freq: Every day | ORAL | Status: DC
  Administered 2018-03-13 – 2018-03-15 (×3): 60 mg via ORAL
  Filled 2018-03-13 (×4): qty 3

## 2018-03-13 MED ORDER — COMPLETENATE 29-1 MG PO CHEW
1.0000 | CHEWABLE_TABLET | Freq: Every day | ORAL | Status: DC
  Filled 2018-03-13 (×2): qty 1

## 2018-03-13 MED ORDER — PRENATAL MULTIVITAMIN CH
1.0000 | ORAL_TABLET | Freq: Every day | ORAL | Status: DC
  Administered 2018-03-13 – 2018-03-15 (×3): 1 via ORAL
  Filled 2018-03-13 (×4): qty 1

## 2018-03-13 NOTE — Progress Notes (Signed)
Patient ID: Kathleen Palmer, female   DOB: 08-17-1960, 57 y.o.   MRN: [id number] [facility] Group Notes:  (Nursing/MHT/Case Management/Adjunct)  Date:  03/13/2018  Time:  1315   Type of Therapy:  Nurse Education  Participation Level:  Active  Participation Quality:  Appropriate  Affect:  Appropriate  Cognitive:  Alert and Oriented  Insight:  Improving  Engagement in Group:  Improving  Modes of Intervention:  Activity, Discussion, Education and Support  Summary of Progress/Problems:   [name] 03/13/2018, 1:26 PM

## 2018-03-13 NOTE — Plan of Care (Signed)
  Problem: Education: Goal: Emotional status will improve Outcome: Not Progressing   

## 2018-03-13 NOTE — Progress Notes (Signed)
The patient stated that she had a bad start to her day but that she felt better later in the day. According to the pt., her medications were increased and that she was dealing with that issue. She also stated that she was pleased with the fact that she does not feel suicidal.

## 2018-03-13 NOTE — Progress Notes (Signed)
D: Pt was in dayroom upon initial approach.  Pt presents with depressed affect and mood.  She smile on approach.  Pt reports her day "started out really rough but it turned out better."  Her goal tonight is to "go to group, have a snack, and go to bed."  Pt denies SI/HI, denies hallucinations, reports R hip pain of 8/10.  Pt has been visible in milieu interacting with peers and staff appropriately.  Pt attended evening group.    A: Met with pt 1:1.  Actively listened to pt and offered support and encouragement.  PRN medication administered for pain and sleep.  Heat packs provided for pain.  Q15 minute safety checks maintained.  R: Pt is safe on the unit.  Pt is compliant with medications.  Pt verbally contracts for safety.  Will continue to monitor and assess.

## 2018-03-13 NOTE — Progress Notes (Addendum)
D Pt is observed OOB UAL on hte 400 hall today. She is pleasant, cooperative and interactive with her peers and staff on hte unit. SHe got very emotional, wailed and cried loudly when her husband, here via special permission to visit off visitation hours today, was turned away from the front desk because he could not remember the pt's security code. She was redirected to call him at home ( which she did).     A She completed her daily assessment and on this she wrote she denied SI today and she rated her dperession, hopelessness and anxeity " 7/5/8", resepctively. She is upset because she wants physicina to order her tramadol ( for her abdominal pain) and he is reluctantto do so ( It interacts with medicaitons she is already taking). SHe is given po tylenol instead.     R Safety is in place.

## 2018-03-13 NOTE — Progress Notes (Signed)
[facility] MD Progress Note  03/13/2018 7:37 AM Kathleen Palmer  MRN:  [id number] Subjective:   Patient discusses her chronic depression but also the exacerbation related to the death of her twin sister she is processing this well states she does not feel as depressed as when she came and she can contract here has less suicidal thoughts and again no plans or intent at this point.  Only intermittent thoughts that are negative.  Discussed augmentation strategies of B vitamins and the escalation of Prozac  Principal Problem: <principal problem not specified> Diagnosis:   Patient Active Problem List   Diagnosis Date Noted  . Thrombocytopenia ([place]) [D69.6] 03/11/2018  . Ventral hernia [K43.9] 10/27/2017  . Gastritis due to nonsteroidal anti-inflammatory drug [K29.60, T39.395A]   . Constipation [K59.00] 12/30/2016  . Substance induced mood disorder ([place]) [F19.94] 12/04/2016  . MDD (major depressive disorder), recurrent severe, without psychosis ([place]) [F33.2] 12/04/2016  . Family hx of ALS (amyotrophic lateral sclerosis) [Z82.0] 08/31/2015  . Chronic hepatitis C ([place]) [B18.2] 01/24/2014  . Osteopenia [M85.80] 12/02/2013  . OA (osteoarthritis) of knee [M17.10] 09/20/2013  . Clitoral irritation [N90.89] 08/01/2013  . Epidermoid cyst of skin [L72.0] 08/01/2013  . Atypical chest pain [R07.89] 03/25/2013  . Encounter for screening colonoscopy [Z12.11] 03/25/2013  . Knee pain [M25.569] 03/25/2013  . Marijuana use [F12.90] 07/20/2012  . Unspecified vitamin D deficiency [E55.9] 06/02/2012  . Pain [R52] 04/07/2012  . Elevated blood pressure (not hypertension) [R03.0] 04/06/2012  . 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 Subclinical hypothyroidism [E03.9] 02/06/2012  . Chronic pain syndrome [G89.4] 02/06/2012  . H/O vaginal surgery [Z98.890] 02/06/2012  . 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 Ileostomy status ([place]) [Z93.2] 02/06/2012  . 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 PTSD  (post-traumatic stress disorder) [F43.10] 02/06/2012  . MDD (major depressive disorder) ([place]) [F32.9] 02/06/2012   Total Time spent with patient: 20 minutes Past Medical History:  Past Medical History:  Diagnosis Date  . Angiomyolipoma    Left kidney  . Anxiety   . Arthritis   . Attention to urostomy [facility])   . Chronic pain   . Depression   . Hepatitis C    ? Contracted through IVDA  . Panic 04/29/1999  . PTSD (post-traumatic stress disorder)   . Substance abuse ([place])    Remote history- cocaine, ETOH, Marijuana  . Thyroid disease     Past Surgical History:  Procedure Laterality Date  . ABDOMINAL HYSTERECTOMY    . ABDOMINAL SURGERY    . BIOPSY  01/27/2017   Procedure: BIOPSY;  Surgeon: Danie Binder, MD;  Location[facility];  Service: Endoscopy;;  gastric  . CHOLECYSTECTOMY    . COLONOSCOPY WITH PROPOFOL N/A 01/27/2017   Procedure: COLONOSCOPY WITH PROPOFOL;  Surgeon: Danie Binder, MD;  Location: [facility];  Service: Endoscopy;  Laterality: N/A;  12:00pm  . ESOPHAGOGASTRODUODENOSCOPY (EGD) WITH PROPOFOL N/A 01/27/2017   Procedure: ESOPHAGOGASTRODUODENOSCOPY (EGD) WITH PROPOFOL;  Surgeon: Danie Binder, MD;  Location: [facility];  Service: Endoscopy;  Laterality: N/A;  . HERNIA REPAIR     x2- abdomen  . ILEO LOOP CONDUIT    . 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 VAGINA RECONSTRUCTION SURGERY     Family History:  Family History  Adopted: Yes  Problem Relation Age of Onset  . Depression Mother   .  Depression Sister   . Anxiety disorder Sister   . Bipolar disorder Sister   . Depression Maternal Grandmother   . ADD / ADHD Neg Hx   . Alcohol abuse Neg Hx   . Drug abuse Neg Hx   . Dementia Neg Hx   . OCD Neg Hx   . Paranoid behavior Neg Hx   . Schizophrenia Neg Hx   . Seizures Neg Hx   . Sexual abuse Neg Hx   . Physical abuse Neg Hx    . Suicidality Neg Hx   . Colon cancer Neg Hx     Social History:  Social History   Substance and Sexual Activity  Alcohol Use No  . Alcohol/week: 0.0 standard drinks     Social History   Substance and Sexual Activity  Drug Use Yes  . Types: Marijuana   Comment: cocaine back in the 1980s; Marijuana rarely    Social History   Socioeconomic History  . Marital status: Married    Spouse name: Not on file  . Number of children: 0  . Years of education: Not on file  . Highest education level: Not on file  Occupational History  . Occupation: disability  Social Needs  . Financial resource strain: Not on file  . Food insecurity:    Worry: Not on file    Inability: Not on file  . Transportation needs:    Medical: Not on file    Non-medical: Not on file  Tobacco Use  . Smoking status: Former Smoker    Packs/day: 0.25    Years: 33.00    Pack years: 8.25    Types: Cigarettes    Last attempt to quit: 01/21/2015    Years since quitting: 3.1  . Smokeless tobacco: Never Used  . Tobacco comment: 9-10 cigs a day as of 10/20/2012, (02-07-15 per pt, she stopped smoking 01-21-15)  Substance and Sexual Activity  . Alcohol use: No    Alcohol/week: 0.0 standard drinks  . Drug use: Yes    Types: Marijuana    Comment: cocaine back in the 1980s; Marijuana rarely  . Sexual activity: Yes    Birth control/protection: Surgical  Lifestyle  . Physical activity:    Days per week: Not on file    Minutes per session: Not on file  . Stress: Not on file  Relationships  . Social connections:    Talks on phone: Not on file    Gets together: Not on file    Attends religious service: Not on file    Active member of club or organization: Not on file    Attends meetings of clubs or organizations: Not on file    Relationship status: Not on file  Other Topics Concern  . Not on file  Social History Narrative  . Not on file   Additional Social History:    Pain Medications: see  MAR Prescriptions: see MAR Over the Counter: see MAR History of alcohol / drug use?: No history of alcohol / drug abuse               Current Medications: Current Facility-Administered Medications  Medication Dose Route Frequency Provider Last Rate Last Dose  . ALPRAZolam Duanne Moron) tablet 1 mg  1 mg Oral TID PRN Cobos, Myer Peer, MD   1 mg at 03/12/18 1952  . FLUoxetine (PROZAC) capsule 60 mg  60 mg Oral Daily Johnn Hai, MD      . lidocaine (LIDODERM) 5 % 1 patch  1  patch Transdermal Daily Cobos, Myer Peer, MD   1 patch at 03/12/18 1159  . prenatal vitamin w/FE, FA (NATACHEW) chewable tablet 1 tablet  1 tablet Oral Q1200 Johnn Hai, MD      . traZODone (DESYREL) tablet 100 mg  100 mg Oral QHS PRN Cobos, Myer Peer, MD   100 mg at 03/12/18 2103    Lab Results:  Results for orders placed or performed during the hospital encounter of 03/10/18 (from the past 48 hour(s))  T3, free     Status: None   Collection Time: 03/12/18  6:23 AM  Result Value Ref Range   T3, Free 3.5 2.0 - 4.4 pg/mL    Comment: (NOTE) Performed At: [facility] [place], [place] [id number] Rush Farmer MD YK:[id number]   T4, free     Status: None   Collection Time: 03/12/18  6:23 AM  Result Value Ref Range   Free T4 0.98 0.82 - 1.77 ng/dL    Comment: (NOTE) Biotin ingestion may interfere with free T4 tests. If the results are inconsistent with the TSH level, previous test results, or the clinical presentation, then consider biotin interference. If needed, order repeat testing after stopping biotin. Performed at [facility], [place] [address] [place], [place] [address]   CBC with Differential/Platelet     Status: Abnormal   Collection Time: 03/12/18  6:23 AM  Result Value Ref Range   WBC 3.0 (L) 4.0 - 10.5 K/uL   RBC 4.63 3.87 - 5.11 MIL/uL   Hemoglobin 13.9 12.0 - 15.0 g/dL   HCT 43.1 36.0 - 46.0 %   MCV 93.1 80.0 - 100.0 fL   MCH 30.0 26.0 - 34.0 pg   MCHC 32.3  30.0 - 36.0 g/dL   RDW 15.0 11.5 - 15.5 %   Platelets 60 (L) 150 - 400 K/uL    Comment: REPEATED TO VERIFY Immature Platelet Fraction may be clinically indicated, consider ordering this additional test [id number] CONSISTENT WITH PREVIOUS RESULT    nRBC 0.0 0.0 - 0.2 %   Neutrophils Relative % 62 %   Neutro Abs 1.9 1.7 - 7.7 K/uL   Lymphocytes Relative 29 %   Lymphs Abs 0.9 0.7 - 4.0 K/uL   Monocytes Relative 8 %   Monocytes Absolute 0.3 0.1 - 1.0 K/uL   Eosinophils Relative 1 %   Eosinophils Absolute 0.0 0.0 - 0.5 K/uL   Basophils Relative 0 %   Basophils Absolute 0.0 0.0 - 0.1 K/uL   Immature Granulocytes 0 %   Abs Immature Granulocytes 0.01 0.00 - 0.07 K/uL    Comment: Performed at [facility], [place] [address] [place], [place] [address]  Basic metabolic panel     Status: Abnormal   Collection Time: 03/12/18  6:23 AM  Result Value Ref Range   Sodium 143 135 - 145 mmol/L   Potassium 4.4 3.5 - 5.1 mmol/L    Comment: DELTA CHECK NOTED REPEATED TO VERIFY NO VISIBLE HEMOLYSIS    Chloride 110 98 - 111 mmol/L   CO2 28 22 - 32 mmol/L   Glucose, Bld 109 (H) 70 - 99 mg/dL   BUN 12 6 - 20 mg/dL   Creatinine, Ser 1.01 (H) 0.44 - 1.00 mg/dL   Calcium 9.2 8.9 - 10.3 mg/dL   GFR calc non Af Amer 60 (L) >60 mL/min   GFR calc Af Amer >60 >60 mL/min    Comment: (NOTE) The eGFR has been calculated using the CKD  EPI equation. This calculation has not been validated in all clinical situations. eGFR's persistently <60 mL/min signify possible Chronic Kidney Disease.    Anion gap 5 5 - 15    Comment: Performed at [facility], [place] [address] [place], [place] [address]    Blood Alcohol level:  Lab Results  Component Value Date   [facility] <10 03/10/2018   ETH <5 60/45/4098    Metabolic Disorder Labs: Lab Results  Component Value Date   HGBA1C 5.2 03/10/2018   MPG 102.54 03/10/2018   MPG 102.54 12/05/2016   No results found for: PROLACTIN Lab  Results  Component Value Date   CHOL 137 03/10/2018   TRIG 78 03/10/2018   HDL 37 (L) 03/10/2018   CHOLHDL 3.7 03/10/2018   VLDL 16 03/10/2018   LDLCALC 84 03/10/2018   LDLCALC 70 12/05/2016    Physical Findings: AIMS: Facial and Oral Movements Muscles of Facial Expression: None, normal Lips and Perioral Area: None, normal Jaw: None, normal Tongue: None, normal,Extremity Movements Upper (arms, wrists, hands, fingers): None, normal Lower (legs, knees, ankles, toes): None, normal, Trunk Movements Neck, shoulders, hips: None, normal, Overall Severity Severity of abnormal movements (highest score from questions above): None, normal Incapacitation due to abnormal movements: None, normal [name] awareness of abnormal movements (rate only patient's report): No Awareness, Dental Status Current problems with teeth and/or dentures?: No Does patient usually wear dentures?: No  CIWA:    COWS:     Musculoskeletal: Strength & Muscle Tone: within normal limits Gait & Station: normal  Psychiatric Specialty Exam: Physical Exam  ROS  Blood pressure 131/82, pulse 79, temperature 98.2 F (36.8 C), temperature source Oral, resp. rate 16, height '5\' 3"'$  (1.6 m), weight 80.3 kg, SpO2 98 %.Body mass index is 31.35 kg/m.  General Appearance: Casual  Eye Contact:  Fair  Speech:  Clear and Coherent  Volume:  Decreased  Mood:  Depressed  Affect:  Restricted  Thought Process:  Coherent  Orientation:  Full (Time, Place, and Person)  Thought Content:  Logical  Suicidal Thoughts:  Yes.  without intent/plan  Homicidal Thoughts:  No  Memory:  Immediate;   Fair  Judgement:  Intact  Insight:  Good  Psychomotor Activity:  Normal  Concentration:  Concentration: Good  Recall:  Good  Fund of Knowledge:  Good  Language:  Good  Akathisia:  Negative  Handed:  Right  AIMS (if indicated):     Assets:  Communication Skills  ADL's:  Intact  Cognition:  WNL  Sleep:  Number of Hours: 4.75   Escalate  fluoxetine augment with the B vitamins  Treatment Plan Summary: Daily contact with patient to assess and evaluate symptoms and progress in treatment and Medication management  Philicia Heyne, MD 03/13/2018, 7:37 AM
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
## 2018-03-13 NOTE — BHH Group Notes (Signed)
Adult Psychoeducational Group Note  Date:  03/13/2018 Time:  4:38 AM  Group Topic/Focus:  Wrap-Up Group:   The focus of this group is to help patients review their daily goal of treatment and discuss progress on daily workbooks.  Participation Level:  Active  Participation Quality:  Appropriate and Attentive  Affect:  Appropriate  Cognitive:  Alert and Appropriate  Insight: Appropriate and Good  Engagement in Group:  Engaged  Modes of Intervention:  Discussion and Education  Additional Comments:  Pt attended and participated in wrap up group this evening. Pt rated their day a 6/10, due to them crying a lot in their room. Pt spoke with [place] today, which made her fel a little better. Pt is still working on their goal, which is to not cry about the loss of their twin.   Kathleen Palmer 03/13/2018, 4:38 AM

## 2018-03-13 NOTE — BHH Group Notes (Signed)
Pt attended goals and orientation group this morning, pt goal for today is to work on medication increase

## 2018-03-14 LAB — HEPATIC FUNCTION PANEL
ALBUMIN: 3.5 g/dL (ref 3.5–5.0)
ALK PHOS: 82 U/L (ref 38–126)
ALT: 246 U/L — ABNORMAL HIGH (ref 0–44)
AST: 338 U/L — ABNORMAL HIGH (ref 15–41)
BILIRUBIN INDIRECT: 0.8 mg/dL (ref 0.3–0.9)
BILIRUBIN TOTAL: 1.2 mg/dL (ref 0.3–1.2)
Bilirubin, Direct: 0.4 mg/dL — ABNORMAL HIGH (ref 0.0–0.2)
TOTAL PROTEIN: 6.8 g/dL (ref 6.5–8.1)

## 2018-03-14 LAB — CBC WITH DIFFERENTIAL/PLATELET
Abs Immature Granulocytes: 0 10*3/uL (ref 0.00–0.07)
BASOS ABS: 0 10*3/uL (ref 0.0–0.1)
BASOS PCT: 0 %
EOS ABS: 0 10*3/uL (ref 0.0–0.5)
Eosinophils Relative: 0 %
HCT: 44.1 % (ref 36.0–46.0)
Hemoglobin: 13.8 g/dL (ref 12.0–15.0)
Immature Granulocytes: 0 %
Lymphocytes Relative: 33 %
Lymphs Abs: 0.8 10*3/uL (ref 0.7–4.0)
MCH: 29.4 pg (ref 26.0–34.0)
MCHC: 31.3 g/dL (ref 30.0–36.0)
MCV: 94 fL (ref 80.0–100.0)
Monocytes Absolute: 0.2 10*3/uL (ref 0.1–1.0)
Monocytes Relative: 9 %
NRBC: 0 % (ref 0.0–0.2)
Neutro Abs: 1.4 10*3/uL — ABNORMAL LOW (ref 1.7–7.7)
Neutrophils Relative %: 58 %
Platelets: 59 10*3/uL — ABNORMAL LOW (ref 150–400)
RBC: 4.69 MIL/uL (ref 3.87–5.11)
RDW: 14.9 % (ref 11.5–15.5)
WBC: 2.3 10*3/uL — AB (ref 4.0–10.5)
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
## 2018-03-14 NOTE — Progress Notes (Signed)
D: Pt was at nurse's station upon initial approach.  Pt presents with appropriate affect and depressed mood.  She reports her day was "pretty good.  I'm feeling a whole lot better than when I came in."  Pt seen smiling and laughing at times tonight.  Reports she had a good visit with friend and niece.  Pt denies SI/HI, denies hallucinations, reports R hip pain of 8/10.  Pt has been visible in milieu interacting with peers and staff appropriately.  Pt attended evening group.    A: Met with pt 1:1.  Actively listened to pt and offered support and encouragement. PRN medication administered for pain and sleep.  Heat packs provided for pain.  Q15 minute safety checks maintained.  R: Pt is safe on the unit.  Pt is compliant with medications.  Pt verbally contracts for safety.  Will continue to monitor and assess.

## 2018-03-14 NOTE — Progress Notes (Signed)
Patient ID: Kathleen Palmer, female   DOB: 31-Aug-1960, 57 y.o.   MRN: [id number] PER STATE REGULATIONS 482.30  THIS CHART WAS REVIEWED FOR MEDICAL NECESSITY WITH RESPECT TO THE [name] ADMISSION/DURATION OF STAY.  NEXT REVIEW DATE: 03/18/18  Roma Schanz, RN, BSN CASE MANAGER

## 2018-03-14 NOTE — Progress Notes (Signed)
D: [name] affect is brighter.  She states that she is not as tearful or depressed.  She continues to have ongoing anxiety stating, "I've had anxiety attacks since I was admitted here."  She is observed in the day room interacting with her peers.  She denies any thoughts of self harm.  She rates her depression as a 4; hopelessness as a 3; anxiety as a 6.  She asked MD if her husband could visit during non-visiting times and it was approved.  Her husband visited this morning for about 15 minutes, and the visit appeared to go well.  Her goal today is "to be happy."  She declined the lidocaine patch and opted for hot packs for her hip pain. Patient takes her xanax 3 times per day, usually has her last one at dinner.  A: Continue to monitor medication management and MD orders.  Safety checks completed every 15 minutes per protocol.  Offer support and encouragement as needed.  R: Patient is receptive to staff; her behavior is appropriate.

## 2018-03-14 NOTE — Progress Notes (Signed)
[facility] MD Progress Note  03/14/2018 8:23 AM [name]  MRN:  [id number] Subjective:    Kathleen Palmer is pleased with her progress she believes the augmentation strategies are working she states she is pleased that she is not crying today and she usually cries in the morning.  She does not have suicidal thoughts can contract here is requesting to stay until Tuesday because she has had a turnaround and makes wants to make sure that her improvement is sustained before she goes home. Principal Problem: SI Diagnosis: Severe recurrent depression Patient Active Problem List   Diagnosis Date Noted  . Thrombocytopenia ([place]) [D69.6] 03/11/2018  . Ventral hernia [K43.9] 10/27/2017  . Gastritis due to nonsteroidal anti-inflammatory drug [K29.60, T39.395A]   . Constipation [K59.00] 12/30/2016  . Substance induced mood disorder ([name]) [F19.94] 12/04/2016  . MDD (major depressive disorder), recurrent severe, without psychosis ([name]) [F33.2] 12/04/2016  . Family hx of ALS (amyotrophic lateral sclerosis) [Z82.0] 08/31/2015  . Chronic hepatitis C ([place]) [B18.2] 01/24/2014  . Osteopenia [M85.80] 12/02/2013  . OA (osteoarthritis) of knee [M17.10] 09/20/2013  . Clitoral irritation [N90.89] 08/01/2013  . Epidermoid cyst of skin [L72.0] 08/01/2013  . Atypical chest pain [R07.89] 03/25/2013  . Encounter for screening colonoscopy [Z12.11] 03/25/2013  . Knee pain [M25.569] 03/25/2013  . Marijuana use [F12.90] 07/20/2012  . Unspecified vitamin D deficiency [E55.9] 06/02/2012  . Pain [R52] 04/07/2012  . Elevated blood pressure (not hypertension) [R03.0] 04/06/2012  . 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 Subclinical hypothyroidism [E03.9] 02/06/2012  . Chronic pain syndrome [G89.4] 02/06/2012  . H/O vaginal surgery [Z98.890] 02/06/2012  . 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 Ileostomy status ([place]) [Z93.2] 02/06/2012  . 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 PTSD (post-traumatic stress  disorder) [F43.10] 02/06/2012  . MDD (major depressive disorder) ([place]) [F32.9] 02/06/2012   Total Time spent with patient: 20 minutes   Past Medical History:  Past Medical History:  Diagnosis Date  . Angiomyolipoma    Left kidney  . Anxiety   . Arthritis   . Attention to urostomy [facility])   . Chronic pain   . Depression   . Hepatitis C    ? Contracted through IVDA  . Panic 04/29/1999  . PTSD (post-traumatic stress disorder)   . Substance abuse ([place])    Remote history- cocaine, ETOH, Marijuana  . Thyroid disease     Past Surgical History:  Procedure Laterality Date  . ABDOMINAL HYSTERECTOMY    . ABDOMINAL SURGERY    . BIOPSY  01/27/2017   Procedure: BIOPSY;  Surgeon: Danie Binder, MD;  [facility];  Service: Endoscopy;;  gastric  . CHOLECYSTECTOMY    . COLONOSCOPY WITH PROPOFOL N/A 01/27/2017   Procedure: COLONOSCOPY WITH PROPOFOL;  Surgeon: Danie Binder, MD;  Location: [facility];  Service: Endoscopy;  Laterality: N/A;  12:00pm  . ESOPHAGOGASTRODUODENOSCOPY (EGD) WITH PROPOFOL N/A 01/27/2017   Procedure: ESOPHAGOGASTRODUODENOSCOPY (EGD) WITH PROPOFOL;  Surgeon: Danie Binder, MD;  Location: [facility];  Service: Endoscopy;  Laterality: N/A;  . HERNIA REPAIR     x2- abdomen  . ILEO LOOP CONDUIT    . 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 VAGINA RECONSTRUCTION SURGERY     Family History:  Family History  Adopted: Yes  Problem Relation Age of Onset  . Depression Mother   . Depression Sister   .  Anxiety disorder Sister   . Bipolar disorder Sister   . Depression Maternal Grandmother   . ADD / ADHD Neg Hx   . Alcohol abuse Neg Hx   . Drug abuse Neg Hx   . Dementia Neg Hx   . OCD Neg Hx   . Paranoid behavior Neg Hx   . Schizophrenia Neg Hx   . Seizures Neg Hx   . Sexual abuse Neg Hx   . Physical abuse Neg Hx   . Suicidality Neg  Hx   . Colon cancer Neg Hx    Social History:  Social History   Substance and Sexual Activity  Alcohol Use No  . Alcohol/week: 0.0 standard drinks     Social History   Substance and Sexual Activity  Drug Use Yes  . Types: Marijuana   Comment: cocaine back in the 1980s; Marijuana rarely    Social History   Socioeconomic History  . Marital status: Married    Spouse name: Not on file  . Number of children: 0  . Years of education: Not on file  . Highest education level: Not on file  Occupational History  . Occupation: disability  Social Needs  . Financial resource strain: Not on file  . Food insecurity:    Worry: Not on file    Inability: Not on file  . Transportation needs:    Medical: Not on file    Non-medical: Not on file  Tobacco Use  . Smoking status: Former Smoker    Packs/day: 0.25    Years: 33.00    Pack years: 8.25    Types: Cigarettes    Last attempt to quit: 01/21/2015    Years since quitting: 3.1  . Smokeless tobacco: Never Used  . Tobacco comment: 9-10 cigs a day as of 10/20/2012, (02-07-15 per pt, she stopped smoking 01-21-15)  Substance and Sexual Activity  . Alcohol use: No    Alcohol/week: 0.0 standard drinks  . Drug use: Yes    Types: Marijuana    Comment: cocaine back in the 1980s; Marijuana rarely  . Sexual activity: Yes    Birth control/protection: Surgical  Lifestyle  . Physical activity:    Days per week: Not on file    Minutes per session: Not on file  . Stress: Not on file  Relationships  . Social connections:    Talks on phone: Not on file    Gets together: Not on file    Attends religious service: Not on file    Active member of club or organization: Not on file    Attends meetings of clubs or organizations: Not on file    Relationship status: Not on file  Other Topics Concern  . Not on file  Social History Narrative  . Not on file   Additional Social History:    Pain Medications: see MAR Prescriptions: see MAR Over the  Counter: see MAR History of alcohol / drug use?: No history of alcohol / drug abuse         Current Facility-Administered Medications  Medication Dose Route Frequency Provider Last Rate Last Dose  . acetaminophen (TYLENOL) tablet 500 mg  500 mg Oral Q6H PRN Cobos, Myer Peer, MD   500 mg at 03/13/18 2112  . ALPRAZolam Duanne Moron) tablet 1 mg  1 mg Oral TID PRN Cobos, Myer Peer, MD   1 mg at 03/14/18 0626  . FLUoxetine (PROZAC) capsule 60 mg  60 mg Oral Daily Johnn Hai, MD   60 mg  at 03/14/18 0759  . prenatal multivitamin tablet 1 tablet  1 tablet Oral Q1200 Cobos, Myer Peer, MD   1 tablet at 03/13/18 1214  . traZODone (DESYREL) tablet 100 mg  100 mg Oral QHS PRN Cobos, Myer Peer, MD   100 mg at 03/13/18 2113    Lab Results: No results found for this or any previous visit (from the past 64 hour(s)).  Blood Alcohol level:  Lab Results  Component Value Date   ETH <10 03/10/2018   ETH <5 91/79/1505    Metabolic Disorder Labs: Lab Results  Component Value Date   HGBA1C 5.2 03/10/2018   MPG 102.54 03/10/2018   MPG 102.54 12/05/2016   No results found for: PROLACTIN Lab Results  Component Value Date   CHOL 137 03/10/2018   TRIG 78 03/10/2018   HDL 37 (L) 03/10/2018   CHOLHDL 3.7 03/10/2018   VLDL 16 03/10/2018   LDLCALC 84 03/10/2018   LDLCALC 70 12/05/2016    Physical Findings: AIMS: Facial and Oral Movements Muscles of Facial Expression: None, normal Lips and Perioral Area: None, normal Jaw: None, normal Tongue: None, normal,Extremity Movements Upper (arms, wrists, hands, fingers): None, normal Lower (legs, knees, ankles, toes): None, normal, Trunk Movements Neck, shoulders, hips: None, normal, Overall Severity Severity of abnormal movements (highest score from questions above): None, normal Incapacitation due to abnormal movements: None, normal [name] awareness of abnormal movements (rate only patient's report): No Awareness, Dental Status Current problems with  teeth and/or dentures?: No Does patient usually wear dentures?: No  CIWA:    COWS:     Musculoskeletal: Strength & Muscle Tone: within normal limits Gait & Station: normal Patient leans: N/A  Psychiatric Specialty Exam: Physical Exam  ROS  Blood pressure 128/80, pulse 71, temperature 97.9 F (36.6 C), temperature source Oral, resp. rate 18, height 5\' 3"  (1.6 m), weight 80.3 kg, SpO2 98 %.Body mass index is 31.35 kg/m.  General Appearance: Casual  Eye Contact:  Good  Speech:  Clear and Coherent  Volume:  Normal  Mood:  Depressed  Affect:  Congruent  Thought Process:  Goal Directed  Orientation:  Full (Time, Place, and Person)  Thought Content:  Logical  Suicidal Thoughts:  No  Homicidal Thoughts:  No  Memory:  Immediate;   Good  Judgement:  Good  Insight:  Good  Psychomotor Activity:  Normal  Concentration:  Concentration: Good  Recall:  Good  Fund of Knowledge:  Good  Language:  Good  Akathisia:  Negative  Handed:  Right  AIMS (if indicated):     Assets:  Communication Skills  ADL's:  Intact  Cognition:  WNL  Sleep:  Number of Hours: 6.75     Treatment Plan Summary: Daily contact with patient to assess and evaluate symptoms and progress in treatment and Medication management  Jian Hodgman, MD 03/14/2018, 8:23 AM

## 2018-03-14 NOTE — Progress Notes (Signed)
Adult Psychoeducational Group Note  Date:  03/14/2018 Time:  9:30 PM  Group Topic/Focus:  Wrap-Up Group:   The focus of this group is to help patients review their daily goal of treatment and discuss progress on daily workbooks.  Participation Level:  Active  Participation Quality:  Appropriate  Affect:  Appropriate  Cognitive:  Appropriate  Insight: Appropriate  Engagement in Group:  Engaged  Modes of Intervention:  Discussion  Additional Comments:  Patient attended group and said that her day was a 8. The most excited part of her day was when she had a visit from her husband, niece and a friend.   Kathleen Palmer Kathleen Palmer [id number]/19/4712, 9:30 PM

## 2018-03-15 DIAGNOSIS — D709 Neutropenia, unspecified: Secondary | ICD-10-CM

## 2018-03-15 MED ORDER — FLUOXETINE HCL 20 MG PO CAPS
40.0000 mg | ORAL_CAPSULE | Freq: Every day | ORAL | Status: DC
  Filled 2018-03-15: qty 2

## 2018-03-15 MED ORDER — TRAMADOL HCL 50 MG PO TABS
25.0000 mg | ORAL_TABLET | Freq: Two times a day (BID) | ORAL | Status: DC | PRN
  Administered 2018-03-15: 25 mg via ORAL
  Filled 2018-03-15: qty 1

## 2018-03-15 MED ORDER — LEVOTHYROXINE SODIUM 25 MCG PO TABS
25.0000 ug | ORAL_TABLET | Freq: Every day | ORAL | Status: DC
  Administered 2018-03-15 – 2018-03-19 (×5): 25 ug via ORAL
  Filled 2018-03-15 (×6): qty 1

## 2018-03-15 NOTE — Progress Notes (Signed)
D: Patient is alert and cooperative. Patient presents with flat, sad, worried facial expression. Patient denies SI, HI, AVH, and verbally contracts for safety. Patient reports anxiety rated 8/10. Patient reports pain 9/10 in right hip and abdomen(hernia). Patient requests PRN anxiety, pain ,and sleep medication.   A: PRN anxiety, pain, and sleep medication administered per MD order. Support provided. Patient educated on safety on the unit and medications. Routine safety checks every 15 minutes. Patient stated understanding to tell nurse about any new physical symptoms. Patient understands to tell staff of any needs.     R: No adverse drug reactions noted. Patient verbally contracts for safety. Patient remains safe at this time and will continue to monitor.

## 2018-03-15 NOTE — Progress Notes (Addendum)
[facility] MD Progress Note  03/15/2018 9:52 AM Kathleen Palmer  MRN:  [id number] Subjective: patient reports partially improved mood. Denies suicidal ideations.  No medication side effects.   Objective: I have discussed case with treatment team and have met with patient. 57 year old married, on disability,long history of depression, presents for worsening depression triggered by the death of her twin sister 2 months ago, endorses neuro-vegetative symptoms of depression, suicidal ideations.  Patient remains depressed,but reports partial improvement and affect less labile, although still briefly tearful at times. No SI.  Reports chronic pain, which is partially alleviated with local treatment such as heat pad . Have reviewed analgesic options with patient and with hospitalist. Due to to low platelet count advice is to avoid NSAIDs or ASA . Acetaminophen has not been particularly effective and would prefer to avoid/minimize due to elevated LFTs. Would consider Tramadol PRN as an option- patient aware of side effect profile and potential risk of serotonin syndrome . Have reviewed labs with hospitalist . AST and ALT trending up- no associated symptoms. WBC trending down  ( 2.3 today, ANC  1.4) . Platelet count 59 K.  Of note, patient reports she has been on Alprazolam, Trazodone for a long period of time ( years ) , and that these medications have not been associated with side effects. She has been on Prozac for 3 weeks approximately No associated symptoms, no bleeding or bruising reported .  Visible on unit, going to some groups. Pleasant on approach. Denies suicidal ideations. Principal Problem: MDD Diagnosis:   Patient Active Problem List   Diagnosis Date Noted  . Thrombocytopenia ([place]) [D69.6] 03/11/2018  . Ventral hernia [K43.9] 10/27/2017  . Gastritis due to nonsteroidal anti-inflammatory drug [K29.60, T39.395A]   . Constipation [K59.00] 12/30/2016  . Substance induced mood disorder ([place]) [F19.94]  12/04/2016  . MDD (major depressive disorder), recurrent severe, without psychosis ([place]) [F33.2] 12/04/2016  . Family hx of ALS (amyotrophic lateral sclerosis) [Z82.0] 08/31/2015  . Chronic hepatitis C ([place]) [B18.2] 01/24/2014  . Osteopenia [M85.80] 12/02/2013  . OA (osteoarthritis) of knee [M17.10] 09/20/2013  . Clitoral irritation [N90.89] 08/01/2013  . Epidermoid cyst of skin [L72.0] 08/01/2013  . Atypical chest pain [R07.89] 03/25/2013  . Encounter for screening colonoscopy [Z12.11] 03/25/2013  . Knee pain [M25.569] 03/25/2013  . Marijuana use [F12.90] 07/20/2012  . Unspecified vitamin D deficiency [E55.9] 06/02/2012  . Pain [R52] 04/07/2012  . Elevated blood pressure (not hypertension) [R03.0] 04/06/2012  . 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 Subclinical hypothyroidism [E03.9] 02/06/2012  . Chronic pain syndrome [G89.4] 02/06/2012  . H/O vaginal surgery [Z98.890] 02/06/2012  . 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 Ileostomy status ([place]) [Z93.2] 02/06/2012  . 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 PTSD (post-traumatic stress disorder) [F43.10] 02/06/2012  . MDD (major depressive disorder) ([place]) [F32.9] 02/06/2012   Total Time spent with patient: 20 minutes  Past Psychiatric History:   Past Medical History:  Past Medical History:  Diagnosis Date  . Angiomyolipoma    Left kidney  . Anxiety   . Arthritis   . Attention to urostomy [facility])   . Chronic pain   . Depression   . Hepatitis C    ? Contracted through IVDA  . Panic 04/29/1999  . PTSD (post-traumatic stress disorder)   . Substance abuse ([place])    Remote history- cocaine, ETOH, Marijuana  . Thyroid disease     Past Surgical History:  Procedure Laterality Date  . ABDOMINAL HYSTERECTOMY    .  ABDOMINAL SURGERY    . BIOPSY  01/27/2017   Procedure: BIOPSY;  Surgeon: Danie Binder, MD;  Location[facility];  Service: Endoscopy;;  gastric  . CHOLECYSTECTOMY    . COLONOSCOPY WITH PROPOFOL  N/A 01/27/2017   Procedure: COLONOSCOPY WITH PROPOFOL;  Surgeon: Danie Binder, MD;  Location: [facility];  Service: Endoscopy;  Laterality: N/A;  12:00pm  . ESOPHAGOGASTRODUODENOSCOPY (EGD) WITH PROPOFOL N/A 01/27/2017   Procedure: ESOPHAGOGASTRODUODENOSCOPY (EGD) WITH PROPOFOL;  Surgeon: Danie Binder, MD;  Location[facility];  Service: Endoscopy;  Laterality: N/A;  . HERNIA REPAIR     x2- abdomen  . ILEO LOOP CONDUIT    . 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 VAGINA RECONSTRUCTION SURGERY     Family History:  Family History  Adopted: Yes  Problem Relation Age of Onset  . Depression Mother   . Depression Sister   . Anxiety disorder Sister   . Bipolar disorder Sister   . Depression Maternal Grandmother   . ADD / ADHD Neg Hx   . Alcohol abuse Neg Hx   . Drug abuse Neg Hx   . Dementia Neg Hx   . OCD Neg Hx   . Paranoid behavior Neg Hx   . Schizophrenia Neg Hx   . Seizures Neg Hx   . Sexual abuse Neg Hx   . Physical abuse Neg Hx   . Suicidality Neg Hx   . Colon cancer Neg Hx    Family Psychiatric  History:  Social History:  Social History   Substance and Sexual Activity  Alcohol Use No  . Alcohol/week: 0.0 standard drinks     Social History   Substance and Sexual Activity  Drug Use Yes  . Types: Marijuana   Comment: cocaine back in the 1980s; Marijuana rarely    Social History   Socioeconomic History  . Marital status: Married    Spouse name: Not on file  . Number of children: 0  . Years of education: Not on file  . Highest education level: Not on file  Occupational History  . Occupation: disability  Social Needs  . Financial resource strain: Not on file  . Food insecurity:    Worry: Not on file    Inability: Not on file  . Transportation needs:    Medical: Not on file    Non-medical: Not on file  Tobacco Use  . Smoking  status: Former Smoker    Packs/day: 0.25    Years: 33.00    Pack years: 8.25    Types: Cigarettes    Last attempt to quit: 01/21/2015    Years since quitting: 3.1  . Smokeless tobacco: Never Used  . Tobacco comment: 9-10 cigs a day as of 10/20/2012, (02-07-15 per pt, she stopped smoking 01-21-15)  Substance and Sexual Activity  . Alcohol use: No    Alcohol/week: 0.0 standard drinks  . Drug use: Yes    Types: Marijuana    Comment: cocaine back in the 1980s; Marijuana rarely  . Sexual activity: Yes    Birth control/protection: Surgical  Lifestyle  . Physical activity:    Days per week: Not on file    Minutes per session: Not on file  . Stress: Not on file  Relationships  . Social connections:    Talks on phone: Not on file    Gets together:  Not on file    Attends religious service: Not on file    Active member of club or organization: Not on file    Attends meetings of clubs or organizations: Not on file    Relationship status: Not on file  Other Topics Concern  . Not on file  Social History Narrative  . Not on file   Additional Social History:    Pain Medications: see MAR Prescriptions: see MAR Over the Counter: see MAR History of alcohol / drug use?: No history of alcohol / drug abuse  Sleep: improving   Appetite:  Fair  Current Medications: Current Facility-Administered Medications  Medication Dose Route Frequency Provider Last Rate Last Dose  . ALPRAZolam Duanne Moron) tablet 1 mg  1 mg Oral TID PRN Cobos, Myer Peer, MD   1 mg at 03/15/18 0625  . FLUoxetine (PROZAC) capsule 60 mg  60 mg Oral Daily Johnn Hai, MD   60 mg at 03/15/18 0806  . levothyroxine (SYNTHROID, LEVOTHROID) tablet 25 mcg  25 mcg Oral Q0600 Annita Brod, MD      . prenatal multivitamin tablet 1 tablet  1 tablet Oral Q1200 Cobos, Myer Peer, MD   1 tablet at 03/14/18 1208  . traZODone (DESYREL) tablet 100 mg  100 mg Oral QHS PRN Cobos, Myer Peer, MD   100 mg at 03/14/18 2107    Lab Results:   Results for orders placed or performed during the hospital encounter of 03/10/18 (from the past 48 hour(s))  CBC with Differential/Platelet     Status: Abnormal   Collection Time: 03/14/18  6:13 AM  Result Value Ref Range   WBC 2.3 (L) 4.0 - 10.5 K/uL   RBC 4.69 3.87 - 5.11 MIL/uL   Hemoglobin 13.8 12.0 - 15.0 g/dL   HCT 44.1 36.0 - 46.0 %   MCV 94.0 80.0 - 100.0 fL   MCH 29.4 26.0 - 34.0 pg   MCHC 31.3 30.0 - 36.0 g/dL   RDW 14.9 11.5 - 15.5 %   Platelets 59 (L) 150 - 400 K/uL    Comment: Immature Platelet Fraction may be clinically indicated, consider ordering this additional test [id number]    nRBC 0.0 0.0 - 0.2 %   Neutrophils Relative % 58 %   Neutro Abs 1.4 (L) 1.7 - 7.7 K/uL   Lymphocytes Relative 33 %   Lymphs Abs 0.8 0.7 - 4.0 K/uL   Monocytes Relative 9 %   Monocytes Absolute 0.2 0.1 - 1.0 K/uL   Eosinophils Relative 0 %   Eosinophils Absolute 0.0 0.0 - 0.5 K/uL   Basophils Relative 0 %   Basophils Absolute 0.0 0.0 - 0.1 K/uL   Immature Granulocytes 0 %   Abs Immature Granulocytes 0.00 0.00 - 0.07 K/uL    Comment: Performed at [facility], [place] [address] [place], [place] [address]  Hepatic function panel     Status: Abnormal   Collection Time: 03/14/18  6:13 AM  Result Value Ref Range   Total Protein 6.8 6.5 - 8.1 g/dL   Albumin 3.5 3.5 - 5.0 g/dL   AST 338 (H) 15 - 41 U/L   ALT 246 (H) 0 - 44 U/L   Alkaline Phosphatase 82 38 - 126 U/L   Total Bilirubin 1.2 0.3 - 1.2 mg/dL   Bilirubin, Direct 0.4 (H) 0.0 - 0.2 mg/dL   Indirect Bilirubin 0.8 0.3 - 0.9 mg/dL    Comment: Performed at [facility], [place] [place],  [place] [address]    Blood Alcohol level:  Lab Results  Component Value Date   ETH <10 03/10/2018   ETH <5 56/31/4970    Metabolic Disorder Labs: Lab Results  Component Value Date   HGBA1C 5.2 03/10/2018   MPG 102.54 03/10/2018   MPG 102.54 12/05/2016   No results found for: PROLACTIN Lab  Results  Component Value Date   CHOL 137 03/10/2018   TRIG 78 03/10/2018   HDL 37 (L) 03/10/2018   CHOLHDL 3.7 03/10/2018   VLDL 16 03/10/2018   LDLCALC 84 03/10/2018   LDLCALC 70 12/05/2016    Physical Findings: AIMS: Facial and Oral Movements Muscles of Facial Expression: None, normal Lips and Perioral Area: None, normal Jaw: None, normal Tongue: None, normal,Extremity Movements Upper (arms, wrists, hands, fingers): None, normal Lower (legs, knees, ankles, toes): None, normal, Trunk Movements Neck, shoulders, hips: None, normal, Overall Severity Severity of abnormal movements (highest score from questions above): None, normal Incapacitation due to abnormal movements: None, normal [name] awareness of abnormal movements (rate only patient's report): No Awareness, Dental Status Current problems with teeth and/or dentures?: No Does patient usually wear dentures?: No  CIWA:    COWS:     Musculoskeletal: Strength & Muscle Tone: within normal limits Gait & Station: affected by chronic pain- steady Patient leans: N/A  Psychiatric Specialty Exam: Physical Exam  ROS no headache, no chest pain, no vomiting, no bleeding or bruising, no fever, no chills Chronic pain.    Blood pressure (!) 125/93, pulse 84, temperature 98.2 F (36.8 C), temperature source Oral, resp. rate 18, height '5\' 3"'$  (1.6 m), weight 80.3 kg, SpO2 98 %.Body mass index is 31.35 kg/m.  General Appearance: Improving grooming  Eye Contact:  Good  Speech:  Normal Rate  Volume:  Normal  Mood:  partially improved, still depressed   Affect:  less constricted, intermittently tearful  Thought Process:  Linear and Descriptions of Associations: Intact  Orientation:  Other:  Fully alert and attentive  Thought Content:  No hallucinations, no delusions expressed  Suicidal Thoughts:  No-denies suicidal or self-injurious ideations, no homicidal or violent ideations, currently contracts for safety on unit  Homicidal  Thoughts:  No  Memory:  Recent and remote grossly intact  Judgement:  Other:  Improving  Insight:  Improving  Psychomotor Activity:  Normal-no psychomotor agitation or restlessness  Concentration:  Concentration: Good and Attention Span: Good  Recall:  Good  Fund of Knowledge:  Good  Language:  Good  Akathisia:  Negative  Handed:  Right  AIMS (if indicated):     Assets:  Communication Skills Desire for Improvement Resilience  ADL's:  Intact  Cognition:  WNL  Sleep:  Number of Hours: 6.75   Assessment -  57 year old married, on disability,long history of depression, presents for worsening depression triggered by the death of her twin sister 2 months ago, endorses neuro-vegetative symptoms of depression, suicidal ideations.  Patient presents depressed, affect constricted but more reactive than on admission and not tearful today, smiles briefly at times, appropriately.  Continues to grieve recent death of her sister.  Patient presents with some improvement in mood and a more reactive  affect, although still reports some depression and affect remains labile and tearful at time when reviewing stressors, remembering sister. Denies SI. She is presenting with thrombocytopenia, Leukopenia, and elevated AST, ALT, without associated symptoms. She has a history of Hep C  Have reviewed above with hospitalist . It is unlikely Xanax or Trazodone are contributing  to above findings and patient reports she has been on these medications consistently  for years. Prozac is newest medication patient is on, started in late October, but reports she was on it a few years ago with good response and no side effects. Unlikely Prozac is causing transaminase elevation or leukopenia, but since it is a recent medication for patient will discontinue for now / monitor labs further.   Reviewed analgesic options with pharmacist and with hospitalist. Low dose Ultram considered an option. Side effects, including potential risk  of serotonin syndrome ,reviewed with patient.   Treatment Plan Summary: Daily contact with patient to assess and evaluate symptoms and progress in treatment, Medication management, Plan Inpatient treatment and Medications as below  Treatment plan reviewed as below today 11/18 Encourage group and milieu participation to work on coping skills and symptom reduction Discontinue Prozac for now- see rationale above  Started on Synthroid 25 micrograms QDAY  Continue Xanax 1 mg 3 times daily PRN for anxiety Continue Trazodone 100 mg nightly PRN for insomnia Ultram 25 mgrs Q 12 hours PRN for pain  Check EKG to monitor QTc  Monitor CBC /diff and AST/ALT in AM Jenne Campus, MD 03/15/2018, 9:52 AM   Patient ID: Kathleen Palmer, female   DOB: Jun 10, 1960, 57 y.o.   MRN: [id number]

## 2018-03-15 NOTE — Plan of Care (Signed)
  Problem: Safety: Goal: Periods of time without injury will increase Outcome: Progressing Note:  Pt has not harmed self or others tonight.  She denies SI/HI and verbally contracts for safety.

## 2018-03-15 NOTE — Plan of Care (Signed)
  Problem: Education: Goal: Knowledge of [facility] Education information/materials will improve Outcome: Progressing   Problem: Safety: Goal: Periods of time without injury will increase Outcome: Progressing   Patient oriented to the unit. Patient remains safe and will continue to monitor.  

## 2018-03-15 NOTE — Progress Notes (Signed)
Follow-up:  [name] follow-up thyroid studies noteworthy for a normal free T3 and free T4, indicative of subclinical hypothyroidism.  Unlike her TSH which was mildly elevated in February, her TSH now is almost 15.  Studies have shown benefit in treating subclinical hypothyroidism when TSH is greater than 10.  We will start Synthroid at 25 mcg p.o. daily.  Patient will need repeat thyroid function studies in May, 6 months from now for dose adjustment.  Abnormal LFTs: Mild increase in transaminitis compared to several days ago.  This could be from a number of factors including fatty liver disease (seen on abdominal CT a year ago) and patient also has a history of hepatitis C.  Fortunately, both are being followed as outpatient by liver specialist in [place].  Would not make any intervention at this time.  Thrombocytopenia: Secondary to liver disease.  No active bleeding.  Neutropenia: Mild.  Unclear etiology.  [name] Prozac would not cause this.  Monitor for now.

## 2018-03-15 NOTE — Progress Notes (Signed)
Recreation Therapy Notes  Date: 11.18.19 Time: 0930 Location: 300 Hall Dayroom  Group Topic: Stress Management  Goal Area(s) Addresses:  Patient will verbalize importance of using healthy stress management.  Patient will identify positive emotions associated with healthy stress management.   Intervention: Stress Management  Activity :  Guided Imagery.  LRT introduced the stress management technique of guided imagery.  LRT read a script that took patients on a journey through the forest.  Patients were to listen as the script was read to engage in the activity.  Education:  Stress Management, Discharge Planning.   Education Outcome: Acknowledges edcuation/In group clarification offered/Needs additional education  Clinical Observations/Feedback: Pt did not attend group.     Victorino Sparrow,  LRT/CTRS         Victorino Sparrow A 03/15/2018 11:28 AM

## 2018-03-15 NOTE — Plan of Care (Signed)
Problem: Medication: Goal: Compliance with prescribed medication regimen will improve Outcome: Progressing   Problem: Safety: Goal: Ability to disclose and discuss suicidal ideas will improve Outcome: Progressing  DAR Note: Pt A & O to self, place and situation. Observed in dayroom on initial contact. Denies SI, HI and AVH when assessed. Pt presents tearful with depressed affect and mood for majority of this shift. States "well if my blood work is so high I might as well die and go meet my twin sister, I miss her do much any ways, I can't be like this". Met with chaplain earlier this shift to discuss grief issues "my sister will not want me to live like this". Prozac discontinued this shift due to elevated AST, ALT per MD.  Pt started on Synthroid related T3, T4 results and hospitalist recommendation. Pt visible in hall and in dayroom at intervals during this shift. Pt did not attend groups as scheduled despite multiple prompts.  Emotional support and encouragement provided throughout this shift. Scheduled and PRN (Xenax) medications given per MD's orders with verbal education and effects monitored. EKG done and tolerated well. Q 15 safety checks maintained without incident to note thus far.  Pt has been cooperative with care. Denies concerns at this time. POC continues for safety and mood stability.

## 2018-03-15 NOTE — Progress Notes (Signed)
Adult Psychoeducational Group Note  Date:  03/15/2018 Time:  9:13 PM  Group Topic/Focus:  Wrap-Up Group:   The focus of this group is to help patients review their daily goal of treatment and discuss progress on daily workbooks.  Participation Level:  Active  Participation Quality:  Appropriate  Affect:  Appropriate  Cognitive:  Appropriate  Insight: Appropriate  Engagement in Group:  Engaged  Modes of Intervention:  Discussion  Additional Comments:  Patient attended group and said that her day was a 7. Her dream career is to be a [employer] of [employer] [place] She chose that career path cause she believes she can fix everything.  Ariane Ditullio W Blonnie Maske 55/83/1674, 9:13 PM

## 2018-03-16 LAB — CBC WITH DIFFERENTIAL/PLATELET
ABS IMMATURE GRANULOCYTES: 0 10*3/uL (ref 0.00–0.07)
BASOS PCT: 0 %
Basophils Absolute: 0 10*3/uL (ref 0.0–0.1)
Eosinophils Absolute: 0 10*3/uL (ref 0.0–0.5)
Eosinophils Relative: 1 %
HCT: 41.9 % (ref 36.0–46.0)
HEMOGLOBIN: 13.1 g/dL (ref 12.0–15.0)
IMMATURE GRANULOCYTES: 0 %
LYMPHS PCT: 29 %
Lymphs Abs: 0.7 10*3/uL (ref 0.7–4.0)
MCH: 29.4 pg (ref 26.0–34.0)
MCHC: 31.3 g/dL (ref 30.0–36.0)
MCV: 94.2 fL (ref 80.0–100.0)
Monocytes Absolute: 0.2 10*3/uL (ref 0.1–1.0)
Monocytes Relative: 10 %
NEUTROS ABS: 1.4 10*3/uL — AB (ref 1.7–7.7)
NEUTROS PCT: 60 %
Platelets: 67 10*3/uL — ABNORMAL LOW (ref 150–400)
RBC: 4.45 MIL/uL (ref 3.87–5.11)
RDW: 15 % (ref 11.5–15.5)
WBC: 2.3 10*3/uL — ABNORMAL LOW (ref 4.0–10.5)
nRBC: 0 % (ref 0.0–0.2)

## 2018-03-16 LAB — ALT: ALT: 300 U/L — AB (ref 0–44)

## 2018-03-16 LAB — AST: AST: 397 U/L — AB (ref 15–41)

## 2018-03-16 LAB — AMMONIA: Ammonia: 25 umol/L (ref 9–35)

## 2018-03-16 LAB — APTT: APTT: 33 s (ref 24–36)

## 2018-03-16 LAB — PROTIME-INR
INR: 1.18
Prothrombin Time: 14.9 seconds (ref 11.4–15.2)

## 2018-03-16 MED ORDER — DESVENLAFAXINE SUCCINATE ER 25 MG PO TB24
25.0000 mg | ORAL_TABLET | Freq: Every day | ORAL | Status: DC
  Administered 2018-03-17 – 2018-03-19 (×3): 25 mg via ORAL
  Filled 2018-03-16 (×4): qty 1

## 2018-03-16 MED ORDER — DESVENLAFAXINE SUCCINATE ER 25 MG PO TB24
25.0000 mg | ORAL_TABLET | Freq: Every day | ORAL | Status: DC
  Filled 2018-03-16 (×2): qty 1

## 2018-03-16 NOTE — Progress Notes (Signed)
Patient ID: Kathleen Palmer, female   DOB: 04/16/1961, 57 y.o.   MRN: [id number]  Nursing Progress Note [id number]  Data: Patient presents initially pleasant and is observed resting in bed on initial approach. Patient became very anxious and tearful when she was informed her Prozac was discontinued. MD notified. Patient reports on-going anxiety and is requesting her PRN Xanax often. Patient completed self-inventory sheet and rates depression, hopelessness, and anxiety 4,3,6 respectively. Patient rates their sleep and appetite as good/good respectively. Patient states goal for today is to "talk to the doctor and think positive thoughts". Patient is seen attending groups and visible in the milieu. Patient currently denies SI/HI/AVH.   Action: Patient is educated about and provided medication per provider's orders. Patient safety maintained with q15 min safety checks and frequent rounding. Low fall risk precautions in place. Emotional support given. 1:1 interaction and active listening provided. Patient encouraged to attend meals, groups, and work on treatment plan and goals. Labs, vital signs and patient behavior monitored throughout shift.   Response: Patient remains safe on the unit at this time and agrees to come to staff with any issues/concerns. Patient is interacting with peers appropriately on the unit. Will continue to support and monitor.

## 2018-03-16 NOTE — Plan of Care (Signed)
  Problem: Activity: Goal: Interest or engagement in activities will improve Outcome: Progressing   Problem: Health Behavior/Discharge Planning: Goal: Compliance with treatment plan for underlying cause of condition will improve Outcome: Progressing   Problem: Safety: Goal: Periods of time without injury will increase Outcome: Progressing   

## 2018-03-16 NOTE — Consult Note (Signed)
ECT: Consult received, chart reviewed.  57 year old woman currently in the hospital in [place] with major depression.  Recent complication of abnormalities in liver enzymes and other blood tests leading to discontinuation of antidepressant medicine.  Primary psychiatrist raised the possibility of ECT.  According to the most recent doctor's note the patient is declining the recommendation at this point.  If the patient changes her mind please get in contact with me I would be glad to reassess.

## 2018-03-16 NOTE — Progress Notes (Signed)
  Follow-up note: Chart reviewed. Vital stable.    Subclinical hypothyroidism (TSH 14.9 with a normal free T3 and T4) Started on Synthroid 25 mcg daily.  Repeat TSH in 3 months.   Transaminitis Noted for elevated transaminases compared to several days back.  Patient had ultrasound of her abdomen on 11/1 which showed finding of hepatic cirrhosis.  No focal liver lesions noted. No hx of varices. D/w primary who plans to arrange appt with GI as inpt vs outpt depending on how her LFTs progress.. Patient has history of hep C and being followed in the liver clinic in [place].  Agree with holding tramadol.  On low-dose Xanax for anxiety which should be used with caution.  Okay to use trazodone.  Was previously on fluoxetine which is held. INR and ammonia level ordered by primary.   Neutropenia/ thrombocytopenia Mild, chronic.  Prozac on hold.   Please check LFTs daily while in the facility. Will sign off . Discussed plan with Dr Mallie Darting on the phone in detail.

## 2018-03-16 NOTE — Progress Notes (Signed)
Adult Psychoeducational Group Note  Date:  03/16/2018 Time:  9:15 AM  Group Topic/Focus:  Goals Group:   The focus of this group is to help patients establish daily goals to achieve during treatment and discuss how the patient can incorporate goal setting into their daily lives to aide in recovery.  Participation Level:  Active  Participation Quality:  Appropriate  Affect:  Appropriate  Cognitive:  Appropriate  Insight: Appropriate  Engagement in Group:  Engaged  Modes of Intervention:  Discussion  Additional Comments:  Pt attended morning group.   Kathleen Palmer Kathleen Palmer 03/16/2018, 9:15 AM

## 2018-03-16 NOTE — Progress Notes (Signed)
Adult Psychoeducational Group Note  Date:  03/16/2018 Time:  9:09 PM  Group Topic/Focus:  Wrap-Up Group:   The focus of this group is to help patients review their daily goal of treatment and discuss progress on daily workbooks.   Participation Level:  Active  Participation Quality:  Appropriate  Affect:  Appropriate  Cognitive:  Appropriate  Insight: Appropriate  Engagement in Group:  Engaged  Modes of Intervention:  Discussion  Additional Comments:  Pt goal was to speak with the doctor about medication management.  Pt did met goal.  Pt rated the day at a 7/10.  Brina Umeda 03/16/2018, 9:09 PM

## 2018-03-16 NOTE — [facility] Group Notes (Signed)
Adult Nursing Psychoeducational Group Note  Date:  03/16/2018 Time:  4:00 PM  Group Topic/Focus: Early Warning Signs Early Warning Signs:   The focus of this group is to help patients identify signs or symptoms they exhibit before slipping into an unhealthy state or crisis.  Participation Level:  Active  Participation Quality:  Appropriate  Affect:  Appropriate  Cognitive:  Alert and Oriented  Insight: Appropriate and Improving  Engagement in Group:  Developing/Improving  Modes of Intervention:  Discussion, Education and Socialization  Additional Comments:  Patient reports her early warning signs included increased sleeping, decreased appetitie and crying spells.  Kathleen Palmer 03/16/2018, 4:45 PM

## 2018-03-16 NOTE — Progress Notes (Signed)
[facility] MD Progress Note  03/16/2018 10:41 AM ONI DIETZMAN  MRN:  [id number] Subjective: Patient is seen and examined.  Patient is a 57 year old female with a past psychiatric history significant for major depression, posttraumatic stress disorder, chronic pain, hepatitis C who was admitted on 03/11/2018 with worsening depression and suicidal ideation.  Objective: Patient is seen and examined.  Patient is a 57 year old female with the above-stated past psychiatric history who is seen in follow-up.  Review of the chart prior to seeing the patient.  Since she was admitted her liver function enzymes have become more highly elevated, her white blood cell count is slightly dropped, and her platelets had decreased (at least until this a.m.).  On admission the patient had her fluoxetine increased to 60 mg p.o. daily.  She was also placed on tramadol for chronic pain.  During the course of her hospitalization her liver function enzymes have continued to climb.  Repeat liver function enzymes from this morning showed that her AST is continued to climb with today being 397, and her ALT is continued to climb at 300.  Her platelets have essentially stabilized and are at 67,000.  Her white blood cell count remains low at 2.3.  The patient continues to endorse depressive symptoms.  She is tearful, helpless, hopeless and worthless.  She is quite frightened by what is going on with her liver.  She has been seen by internal medicine who made some recommendations.  Her fluoxetine was stopped, and she sees this is 1 of the few antidepressants it works.  We discussed the rationale behind stopping it.  Fluoxetine is very highly liver active drug.  As well, I stopped her tramadol and explained the fact that it is too also a quite liver active drug.  She had been on ibuprofen previously, but because of the drop in her platelets that too is been held.  She stated today that she is supposed to see her liver doctor in January.  Apparently  they are aware that the Harvoni has not worked.  They are supposed to discuss different treatment.  She has previously been on Cymbalta, Effexor, Wellbutrin, Abilify without great success.  We discussed electroconvulsive therapy as an option to treat her depression which would not affect her liver, but at this point she declined that.  We discussed the rationale behind stopping the fluoxetine and tramadol in detail. Principal Problem: <principal problem not specified> Diagnosis: Active Problems:   Subclinical hypothyroidism   Chronic hepatitis C (HCC)   MDD (major depressive disorder), recurrent severe, without psychosis (HCC)   Thrombocytopenia ([place])  Total Time spent with patient: 30 minutes  Past Psychiatric History: See admission H&P  Past Medical History:  Past Medical History:  Diagnosis Date  . Angiomyolipoma    Left kidney  . Anxiety   . Arthritis   . Attention to urostomy [facility])   . Chronic pain   . Depression   . Hepatitis C    ? Contracted through IVDA  . Panic 04/29/1999  . PTSD (post-traumatic stress disorder)   . Substance abuse ([place])    Remote history- cocaine, ETOH, Marijuana  . Thyroid disease     Past Surgical History:  Procedure Laterality Date  . ABDOMINAL HYSTERECTOMY    . ABDOMINAL SURGERY    . BIOPSY  01/27/2017   Procedure: BIOPSY;  Surgeon: Danie Binder, MD;  Location: [facility];  Service: Endoscopy;;  gastric  . CHOLECYSTECTOMY    . COLONOSCOPY WITH PROPOFOL N/A 01/27/2017  Procedure: COLONOSCOPY WITH PROPOFOL;  Surgeon: Danie Binder, MD;  Location: [facility];  Service: Endoscopy;  Laterality: N/A;  12:00pm  . ESOPHAGOGASTRODUODENOSCOPY (EGD) WITH PROPOFOL N/A 01/27/2017   Procedure: ESOPHAGOGASTRODUODENOSCOPY (EGD) WITH PROPOFOL;  Surgeon: Danie Binder, MD;  Location: [facility];  Service: Endoscopy;  Laterality: N/A;  . HERNIA REPAIR     x2- abdomen  . ILEO LOOP CONDUIT    . 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 VAGINA RECONSTRUCTION SURGERY     Family History:  Family History  Adopted: Yes  Problem Relation Age of Onset  . Depression Mother   . Depression Sister   . Anxiety disorder Sister   . Bipolar disorder Sister   . Depression Maternal Grandmother   . ADD / ADHD Neg Hx   . Alcohol abuse Neg Hx   . Drug abuse Neg Hx   . Dementia Neg Hx   . OCD Neg Hx   . Paranoid behavior Neg Hx   . Schizophrenia Neg Hx   . Seizures Neg Hx   . Sexual abuse Neg Hx   . Physical abuse Neg Hx   . Suicidality Neg Hx   . Colon cancer Neg Hx    Family Psychiatric  History: See admission H&P Social History:  Social History   Substance and Sexual Activity  Alcohol Use No  . Alcohol/week: 0.0 standard drinks     Social History   Substance and Sexual Activity  Drug Use Yes  . Types: Marijuana   Comment: cocaine back in the 1980s; Marijuana rarely    Social History   Socioeconomic History  . Marital status: Married    Spouse name: Not on file  . Number of children: 0  . Years of education: Not on file  . Highest education level: Not on file  Occupational History  . Occupation: disability  Social Needs  . Financial resource strain: Not on file  . Food insecurity:    Worry: Not on file    Inability: Not on file  . Transportation needs:    Medical: Not on file    Non-medical: Not on file  Tobacco Use  . Smoking status: Former Smoker    Packs/day: 0.25    Years: 33.00    Pack years: 8.25    Types: Cigarettes    Last attempt to quit: 01/21/2015    Years since quitting: 3.1  . Smokeless tobacco: Never Used  . Tobacco comment: 9-10 cigs a day as of 10/20/2012, (02-07-15 per pt, she stopped smoking 01-21-15)  Substance and Sexual Activity  . Alcohol use: No    Alcohol/week: 0.0 standard drinks  . Drug use: Yes    Types: Marijuana    Comment: cocaine back in the 1980s; Marijuana rarely   . Sexual activity: Yes    Birth control/protection: Surgical  Lifestyle  . Physical activity:    Days per week: Not on file    Minutes per session: Not on file  . Stress: Not on file  Relationships  . Social connections:    Talks on phone: Not on file    Gets together: Not on file    Attends religious service: Not on file    Active member of club or organization: Not on file    Attends meetings of clubs or organizations: Not on file    Relationship status:  Not on file  Other Topics Concern  . Not on file  Social History Narrative  . Not on file   Additional Social History:    Pain Medications: see MAR Prescriptions: see MAR Over the Counter: see MAR History of alcohol / drug use?: No history of alcohol / drug abuse                    Sleep: Fair  Appetite:  Fair  Current Medications: Current Facility-Administered Medications  Medication Dose Route Frequency Provider Last Rate Last Dose  . ALPRAZolam Duanne Moron) tablet 1 mg  1 mg Oral TID PRN Cobos, Myer Peer, MD   1 mg at 03/16/18 8338  . levothyroxine (SYNTHROID, LEVOTHROID) tablet 25 mcg  25 mcg Oral Q0600 Annita Brod, MD   25 mcg at 03/16/18 [phone]  . traZODone (DESYREL) tablet 100 mg  100 mg Oral QHS PRN Cobos, Myer Peer, MD   100 mg at 03/15/18 2208    Lab Results:  Results for orders placed or performed during the hospital encounter of 03/10/18 (from the past 48 hour(s))  CBC with Differential/Platelet     Status: Abnormal   Collection Time: 03/16/18  6:34 AM  Result Value Ref Range   WBC 2.3 (L) 4.0 - 10.5 K/uL   RBC 4.45 3.87 - 5.11 MIL/uL   Hemoglobin 13.1 12.0 - 15.0 g/dL   HCT 41.9 36.0 - 46.0 %   MCV 94.2 80.0 - 100.0 fL   MCH 29.4 26.0 - 34.0 pg   MCHC 31.3 30.0 - 36.0 g/dL   RDW 15.0 11.5 - 15.5 %   Platelets 67 (L) 150 - 400 K/uL    Comment: Immature Platelet Fraction may be clinically indicated, consider ordering this additional test [id number] CONSISTENT WITH PREVIOUS RESULT     nRBC 0.0 0.0 - 0.2 %   Neutrophils Relative % 60 %   Neutro Abs 1.4 (L) 1.7 - 7.7 K/uL   Lymphocytes Relative 29 %   Lymphs Abs 0.7 0.7 - 4.0 K/uL   Monocytes Relative 10 %   Monocytes Absolute 0.2 0.1 - 1.0 K/uL   Eosinophils Relative 1 %   Eosinophils Absolute 0.0 0.0 - 0.5 K/uL   Basophils Relative 0 %   Basophils Absolute 0.0 0.0 - 0.1 K/uL   Immature Granulocytes 0 %   Abs Immature Granulocytes 0.00 0.00 - 0.07 K/uL    Comment: Performed at [facility], [place] [address] [place], [place] [address]  AST     Status: Abnormal   Collection Time: 03/16/18  6:34 AM  Result Value Ref Range   AST 397 (H) 15 - 41 U/L    Comment: Performed at [facility], [place] [address] [place], [place] [address]  ALT     Status: Abnormal   Collection Time: 03/16/18  6:34 AM  Result Value Ref Range   ALT 300 (H) 0 - 44 U/L    Comment: Performed at [facility], [place] [address], [place], [place] [address]    Blood Alcohol level:  Lab Results  Component Value Date   ETH <10 03/10/2018   ETH <5 92/42/6834    Metabolic Disorder Labs: Lab Results  Component Value Date   HGBA1C 5.2 03/10/2018   MPG 102.54 03/10/2018   MPG 102.54 12/05/2016   No results found for: PROLACTIN Lab Results  Component Value Date   CHOL 137 03/10/2018   TRIG 78 03/10/2018   HDL 37 (L)  03/10/2018   CHOLHDL 3.7 03/10/2018   VLDL 16 03/10/2018   LDLCALC 84 03/10/2018   LDLCALC 70 12/05/2016    Physical Findings: AIMS: Facial and Oral Movements Muscles of Facial Expression: None, normal Lips and Perioral Area: None, normal Jaw: None, normal Tongue: None, normal,Extremity Movements Upper (arms, wrists, hands, fingers): None, normal Lower (legs, knees, ankles, toes): None, normal, Trunk Movements Neck, shoulders, hips: None, normal, Overall Severity Severity of abnormal movements (highest score from questions above): None, normal Incapacitation due to  abnormal movements: None, normal [name] awareness of abnormal movements (rate only patient's report): No Awareness, Dental Status Current problems with teeth and/or dentures?: No Does patient usually wear dentures?: No  CIWA:    COWS:     Musculoskeletal: Strength & Muscle Tone: within normal limits Gait & Station: normal Patient leans: N/A  Psychiatric Specialty Exam: Physical Exam  Nursing note and vitals reviewed. Constitutional: She is oriented to person, place, and time. She appears well-developed and well-nourished.  HENT:  Head: Normocephalic and atraumatic.  Respiratory: Effort normal.  Neurological: She is alert and oriented to person, place, and time.    ROS  Blood pressure (!) 118/98, pulse 87, temperature 98.4 F (36.9 C), resp. rate 16, height 5\' 3"  (1.6 m), weight 80.3 kg, SpO2 98 %.Body mass index is 31.35 kg/m.  General Appearance: Casual  Eye Contact:  Fair  Speech:  Normal Rate  Volume:  Decreased  Mood:  Anxious and Depressed  Affect:  Congruent  Thought Process:  Coherent and Descriptions of Associations: Intact  Orientation:  Full (Time, Place, and Person)  Thought Content:  Logical  Suicidal Thoughts:  Yes.  without intent/plan  Homicidal Thoughts:  No  Memory:  Immediate;   Fair Recent;   Fair Remote;   Fair  Judgement:  Intact  Insight:  Fair  Psychomotor Activity:  Increased  Concentration:  Concentration: Fair and Attention Span: Fair  Recall:  [employer] of Knowledge:  Fair  Language:  Good  Akathisia:  Negative  Handed:  Right  AIMS (if indicated):     Assets:  Communication Skills Desire for Improvement Financial Resources/Insurance Housing Resilience Social Support  ADL's:  Intact  Cognition:  WNL  Sleep:  Number of Hours: 6.25     Treatment Plan Summary: Daily contact with patient to assess and evaluate symptoms and progress in treatment, Medication management and Plan Patient is seen and examined.  Patient is a  57 year old female with a past psychiatric history significant for major depression, posttraumatic stress disorder, generalized anxiety disorder.  #1 continue to hold fluoxetine 40 mg p.o. daily for depression #2 continue Xanax 1 mg p.o. 3 times daily as needed for anxiety #3 continue trazodone 100 mg p.o. nightly as needed for insomnia #4 contact pharmacy and confirm least active liver medication for depression excluding the ones listed above. #5 discussed electroconvulsive therapy as an alternative treatment for her depression. #6 check PT/INR and ammonia level. #7 stop tramadol secondary to highly active liver drug #8 continue supportive therapy as well as encouraging attendance at support groups.  Sharma Covert, MD 03/16/2018, 10:41 AM

## 2018-03-17 MED ORDER — ALPRAZOLAM 1 MG PO TABS
1.0000 mg | ORAL_TABLET | Freq: Every day | ORAL | Status: DC
  Filled 2018-03-17: qty 1

## 2018-03-17 MED ORDER — ALPRAZOLAM 1 MG PO TABS
1.0000 mg | ORAL_TABLET | Freq: Three times a day (TID) | ORAL | Status: DC | PRN
  Administered 2018-03-17 – 2018-03-19 (×5): 1 mg via ORAL
  Filled 2018-03-17 (×5): qty 1

## 2018-03-17 NOTE — Progress Notes (Signed)
[facility] MD Progress Note  03/17/2018 11:12 AM Kathleen Palmer  MRN:  [id number] Subjective:  Patient is seen and examined.  Patient is a 57 year old female with a past psychiatric history significant for major depression, posttraumatic stress disorder, chronic pain, hepatitis C who was admitted on 03/11/2018 with worsening depression and suicidal ideation.  Objective: Patient is seen and examined.  Patient is a 57 year old female with the above-stated past psychiatric history who is seen in follow-up.  Patient stated she is feeling better today.  She stated her depression was improving.  She stated that she is been having conversations with her recently deceased twin sister.  She stated that her mood really started worsening after her sister died.  She stated that the groups are helping her process the death of her sister, and she feels like that is been beneficial.  She denied any suicidal ideation today.  With regard to her liver disease.  Her coagulation factors came back, and they are only mildly abnormal with a PT of 14.9, and INR of 1.18, and a PTT of 33.  Additionally her ammonia was only 24.  We discussed repeating her liver function enzymes in the a.m., with the hope that perhaps they are either stabilizing, or beginning to drop.  We also discussed the fact that since her mood is improving that we may hold off on the ECT consult.  She has received her 25 mg dose of Pristiq.  She stated that she found it mildly sedating, and we discussed potentially taking it at bedtime.  She denied any suicidal ideation today.  Principal Problem: MDD (major depressive disorder), recurrent severe, without psychosis ([place]) Diagnosis: Principal Problem:   MDD (major depressive disorder), recurrent severe, without psychosis ([place]) Active Problems:   Subclinical hypothyroidism   Chronic hepatitis C ([place])   Thrombocytopenia ([place])  Total Time spent with patient: 20 minutes  Past Psychiatric History: See admission  H&P  Past Medical History:  Past Medical History:  Diagnosis Date  . Angiomyolipoma    Left kidney  . Anxiety   . Arthritis   . Attention to urostomy [facility])   . Chronic pain   . Depression   . Hepatitis C    ? Contracted through IVDA  . Panic 04/29/1999  . PTSD (post-traumatic stress disorder)   . Substance abuse ([place])    Remote history- cocaine, ETOH, Marijuana  . Thyroid disease     Past Surgical History:  Procedure Laterality Date  . ABDOMINAL HYSTERECTOMY    . ABDOMINAL SURGERY    . BIOPSY  01/27/2017   Procedure: BIOPSY;  Surgeon: Danie Binder, MD;  [facility];  Service: Endoscopy;;  gastric  . CHOLECYSTECTOMY    . COLONOSCOPY WITH PROPOFOL N/A 01/27/2017   Procedure: COLONOSCOPY WITH PROPOFOL;  Surgeon: Danie Binder, MD;  Location: [facility];  Service: Endoscopy;  Laterality: N/A;  12:00pm  . ESOPHAGOGASTRODUODENOSCOPY (EGD) WITH PROPOFOL N/A 01/27/2017   Procedure: ESOPHAGOGASTRODUODENOSCOPY (EGD) WITH PROPOFOL;  Surgeon: Danie Binder, MD;  [facility];  Service: Endoscopy;  Laterality: N/A;  . HERNIA REPAIR     x2- abdomen  . ILEO LOOP CONDUIT    . 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 VAGINA RECONSTRUCTION SURGERY     Family History:  Family History  Adopted: Yes  Problem Relation Age of Onset  . Depression Mother   .  Depression Sister   . Anxiety disorder Sister   . Bipolar disorder Sister   . Depression Maternal Grandmother   . ADD / ADHD Neg Hx   . Alcohol abuse Neg Hx   . Drug abuse Neg Hx   . Dementia Neg Hx   . OCD Neg Hx   . Paranoid behavior Neg Hx   . Schizophrenia Neg Hx   . Seizures Neg Hx   . Sexual abuse Neg Hx   . Physical abuse Neg Hx   . Suicidality Neg Hx   . Colon cancer Neg Hx    Family Psychiatric  History: See admission H&P Social History:  Social History   Substance and  Sexual Activity  Alcohol Use No  . Alcohol/week: 0.0 standard drinks     Social History   Substance and Sexual Activity  Drug Use Yes  . Types: Marijuana   Comment: cocaine back in the 1980s; Marijuana rarely    Social History   Socioeconomic History  . Marital status: Married    Spouse name: Not on file  . Number of children: 0  . Years of education: Not on file  . Highest education level: Not on file  Occupational History  . Occupation: disability  Social Needs  . Financial resource strain: Not on file  . Food insecurity:    Worry: Not on file    Inability: Not on file  . Transportation needs:    Medical: Not on file    Non-medical: Not on file  Tobacco Use  . Smoking status: Former Smoker    Packs/day: 0.25    Years: 33.00    Pack years: 8.25    Types: Cigarettes    Last attempt to quit: 01/21/2015    Years since quitting: 3.1  . Smokeless tobacco: Never Used  . Tobacco comment: 9-10 cigs a day as of 10/20/2012, (02-07-15 per pt, she stopped smoking 01-21-15)  Substance and Sexual Activity  . Alcohol use: No    Alcohol/week: 0.0 standard drinks  . Drug use: Yes    Types: Marijuana    Comment: cocaine back in the 1980s; Marijuana rarely  . Sexual activity: Yes    Birth control/protection: Surgical  Lifestyle  . Physical activity:    Days per week: Not on file    Minutes per session: Not on file  . Stress: Not on file  Relationships  . Social connections:    Talks on phone: Not on file    Gets together: Not on file    Attends religious service: Not on file    Active member of club or organization: Not on file    Attends meetings of clubs or organizations: Not on file    Relationship status: Not on file  Other Topics Concern  . Not on file  Social History Narrative  . Not on file   Additional Social History:    Pain Medications: see MAR Prescriptions: see MAR Over the Counter: see MAR History of alcohol / drug use?: No history of alcohol / drug  abuse                    Sleep: Fair  Appetite:  Fair  Current Medications: Current Facility-Administered Medications  Medication Dose Route Frequency Provider Last Rate Last Dose  . ALPRAZolam Duanne Moron) tablet 1 mg  1 mg Oral TID PRN Cobos, Myer Peer, MD   1 mg at 03/17/18 0615  . Desvenlafaxine Succinate ER TB24 25 mg  25 mg  Oral Daily Cobos, Myer Peer, MD      . levothyroxine (SYNTHROID, LEVOTHROID) tablet 25 mcg  25 mcg Oral Q0600 Annita Brod, MD   25 mcg at 03/17/18 0615  . traZODone (DESYREL) tablet 100 mg  100 mg Oral QHS PRN Cobos, Myer Peer, MD   100 mg at 03/16/18 2222    Lab Results:  Results for orders placed or performed during the hospital encounter of 03/10/18 (from the past 48 hour(s))  CBC with Differential/Platelet     Status: Abnormal   Collection Time: 03/16/18  6:34 AM  Result Value Ref Range   WBC 2.3 (L) 4.0 - 10.5 K/uL   RBC 4.45 3.87 - 5.11 MIL/uL   Hemoglobin 13.1 12.0 - 15.0 g/dL   HCT 41.9 36.0 - 46.0 %   MCV 94.2 80.0 - 100.0 fL   MCH 29.4 26.0 - 34.0 pg   MCHC 31.3 30.0 - 36.0 g/dL   RDW 15.0 11.5 - 15.5 %   Platelets 67 (L) 150 - 400 K/uL    Comment: Immature Platelet Fraction may be clinically indicated, consider ordering this additional test [id number] CONSISTENT WITH PREVIOUS RESULT    nRBC 0.0 0.0 - 0.2 %   Neutrophils Relative % 60 %   Neutro Abs 1.4 (L) 1.7 - 7.7 K/uL   Lymphocytes Relative 29 %   Lymphs Abs 0.7 0.7 - 4.0 K/uL   Monocytes Relative 10 %   Monocytes Absolute 0.2 0.1 - 1.0 K/uL   Eosinophils Relative 1 %   Eosinophils Absolute 0.0 0.0 - 0.5 K/uL   Basophils Relative 0 %   Basophils Absolute 0.0 0.0 - 0.1 K/uL   Immature Granulocytes 0 %   Abs Immature Granulocytes 0.00 0.00 - 0.07 K/uL    Comment: Performed at [facility], [place] [address] [place], [place] [address]  AST     Status: Abnormal   Collection Time: 03/16/18  6:34 AM  Result Value Ref Range   AST 397 (H) 15 - 41 U/L     Comment: Performed at [facility], [place] [address], [place], [place] [address]  ALT     Status: Abnormal   Collection Time: 03/16/18  6:34 AM  Result Value Ref Range   ALT 300 (H) 0 - 44 U/L    Comment: Performed at [facility], [place] [address], [place], [place] [address]  APTT     Status: None   Collection Time: 03/16/18  6:29 PM  Result Value Ref Range   aPTT 33 24 - 36 seconds    Comment: Performed at [facility], [place] [address], [place], [place] [address]  Protime-INR     Status: None   Collection Time: 03/16/18  6:29 PM  Result Value Ref Range   Prothrombin Time 14.9 11.4 - 15.2 seconds   INR 1.18     Comment: Performed at [facility] [place], [place] [address], [place], [place] [address]  Ammonia     Status: None   Collection Time: 03/16/18  6:29 PM  Result Value Ref Range   Ammonia 25 9 - 35 umol/L    Comment: Performed at [facility], [place] [address] [place], [place] [address]    Blood Alcohol level:  Lab Results  Component Value Date   [facility] <10 03/10/2018   ETH <5 41/93/7902    Metabolic Disorder Labs: Lab Results  Component Value Date   HGBA1C 5.2 03/10/2018   MPG  102.54 03/10/2018   MPG 102.54 12/05/2016   No results found for: PROLACTIN Lab Results  Component Value Date   CHOL 137 03/10/2018   TRIG 78 03/10/2018   HDL 37 (L) 03/10/2018   CHOLHDL 3.7 03/10/2018   VLDL 16 03/10/2018   LDLCALC 84 03/10/2018   LDLCALC 70 12/05/2016    Physical Findings: AIMS: Facial and Oral Movements Muscles of Facial Expression: None, normal Lips and Perioral Area: None, normal Jaw: None, normal Tongue: None, normal,Extremity Movements Upper (arms, wrists, hands, fingers): None, normal Lower (legs, knees, ankles, toes): None, normal, Trunk Movements Neck, shoulders, hips: None, normal, Overall Severity Severity of abnormal movements (highest score from questions above): None,  normal Incapacitation due to abnormal movements: None, normal [name] awareness of abnormal movements (rate only patient's report): No Awareness, Dental Status Current problems with teeth and/or dentures?: No Does patient usually wear dentures?: No  CIWA:    COWS:     Musculoskeletal: Strength & Muscle Tone: within normal limits Gait & Station: normal Patient leans: N/A  Psychiatric Specialty Exam: Physical Exam  Nursing note and vitals reviewed. Constitutional: She is oriented to person, place, and time. She appears well-developed and well-nourished.  HENT:  Head: Normocephalic and atraumatic.  Respiratory: Effort normal.  Neurological: She is alert and oriented to person, place, and time.    ROS  Blood pressure 130/84, pulse 99, temperature 98.3 F (36.8 C), temperature source Oral, resp. rate 20, height 5\' 3"  (1.6 m), weight 80.3 kg, SpO2 98 %.Body mass index is 31.35 kg/m.  General Appearance: Casual  Eye Contact:  Fair  Speech:  Normal Rate  Volume:  Normal  Mood:  Anxious and Depressed  Affect:  Congruent  Thought Process:  Coherent and Descriptions of Associations: Intact  Orientation:  Full (Time, Place, and Person)  Thought Content:  Logical  Suicidal Thoughts:  No  Homicidal Thoughts:  No  Memory:  Immediate;   Fair Recent;   Fair Remote;   Fair  Judgement:  Intact  Insight:  Fair  Psychomotor Activity:  Normal  Concentration:  Concentration: Fair and Attention Span: Fair  Recall:  [employer] of Knowledge:  Good  Language:  Good  Akathisia:  Negative  Handed:  Right  AIMS (if indicated):     Assets:  Communication Skills Desire for Improvement Financial Resources/Insurance Housing Intimacy Resilience Social Support  ADL's:  Intact  Cognition:  WNL  Sleep:  Number of Hours: 6     Treatment Plan Summary: Daily contact with patient to assess and evaluate symptoms and progress in treatment, Medication management and Plan : Patient is seen and  examined.  Patient is a 57 year old female with a past psychiatric history significant for major depression, posttraumatic stress disorder, generalized anxiety disorder. Patient seems to be improving.  Her mood is improved, her anxiety is decreased, and she is denying any suicidal ideation today. #1 continue Pristiq 25 mg p.o. daily, but changed to at bedtime for mood stability and anxiety. #2 continue Xanax 1 mg p.o. 3 times daily as needed anxiety. #3 continue trazodone 100 mg p.o. nightly as needed insomnia. #4 hold on ECT consult at this time. #5 repeat liver function enzymes in a.m. Tomorrow. #6 continue supportive therapy and grief counseling. #7 disposition planning-in progress.  Sharma Covert, MD 03/17/2018, 11:12 AM

## 2018-03-17 NOTE — Therapy (Signed)
Occupational Therapy Group Note  Date:  03/17/2018 Time:  2:36 PM  Group Topic/Focus:  Self Esteem Action Plan:   The focus of this group is to help patients create a plan to continue to build self-esteem after discharge.  Participation Level:  Active  Participation Quality:  Appropriate  Affect:  Blunted  Cognitive:  Appropriate  Insight: Improving  Engagement in Group:  Engaged  Modes of Intervention:  Activity, Discussion, Education and Socialization  Additional Comments:    S: "I do a lot of gardening which increases my self esteem"  O: OT tx with focus on self esteem building this date. Education given on definition of self esteem, with both causes of low and high self esteem identified. Activity given for pt to identify a positive/aspiring trait for each letter of the alphabet. Pt to work with peers to help complete activity and build positive thinking.   A: Pt presents to group with appropriate affect, engaged and participatory throughout session. Pt helped add to self esteem education through discussion. Pt chose to not complete A-z activity, but completed positive affirmations and openly shared with the group. Noted improved affect after completion of activity.   P: Education given on self esteem and how to improve this date. Handouts and activities given to help facilitate skills when reintegrating into community.    Zenovia Jarred, MSOT, OTR/L Behavioral Health OT/ Acute Relief OT PHP Office: [place] 03/17/2018, 2:36 PM

## 2018-03-17 NOTE — Progress Notes (Signed)
Adult Psychoeducational Group Note  Date:  03/17/2018 Time:  9:57 PM  Group Topic/Focus:  Wrap-Up Group:   The focus of this group is to help patients review their daily goal of treatment and discuss progress on daily workbooks.  Participation Level:  Active  Participation Quality:  Appropriate  Affect:  Depressed  Cognitive:  Appropriate  Insight: Improving  Engagement in Group:  Developing/Improving  Modes of Intervention:  Discussion  Additional Comments:  Patient rated her day an "8." she states that this was the highest since she has been here. As a way to cope with sister's passing, she "talks to her sister" at night and think about good times and positive moments.   Noel Christmas 03/17/2018, 9:57 PM

## 2018-03-17 NOTE — Progress Notes (Signed)
D:  Ireta has been up and visible on the unit.  She has been attending groups and interacting well with staff and peers.  She reported that her day was pretty good.  She denied SI/HI or A/V hallucinations.  She stated that she is looking forward to going home soon because "I have a lot of support at home and I don't want to die.  My sister would smack me for doing something stupid."  She did report some worry about her liver functions and has follow up in January to try to get on another regimen for her Hep C.  She requested trazodone and xanax for bedtime.  She is currently resting with her eyes closed and appears to be asleep. A:  1:1 with RN for support and encouragement.  Medications as ordered.  Q 15 minute checks maintained for safety.  Encouraged participation in group and unit activities.   R:  Nesiah remains safe on the unit.  We will continue to monitor the progress towards her goals.

## 2018-03-17 NOTE — Plan of Care (Signed)
  Problem: Education: Goal: Mental status will improve Outcome: Progressing Note:  Pt reported feeling better and no longer is having suicidal ideation

## 2018-03-17 NOTE — Progress Notes (Signed)
Recreation Therapy Notes  Date: 11.20.19 Time: 0930 Location: 300 Hall Dayroom  Group Topic: Stress Management  Goal Area(s) Addresses:  Patient will verbalize importance of using healthy stress management.  Patient will identify positive emotions associated with healthy stress management.   Intervention: Stress Management  Activity :  Guided Imagery.  LRT introduced the stress management technique of guided imagery.  LRT read a script to guide patients through a peaceful meadow.  Patients were to follow along as script was read to engage in activity.  Education:  Stress Management, Discharge Planning.   Education Outcome: Acknowledges edcuation/In group clarification offered/Needs additional education  Clinical Observations/Feedback: Pt did not attend group.    Victorino Sparrow, LRT/CTRS         Victorino Sparrow A 03/17/2018 11:25 AM

## 2018-03-17 NOTE — Progress Notes (Signed)
Pt presents with a flat affect and anxious mood. Pt reported decreased symptoms of depression and anxiety. Pt denies SI/HI. Pt expressed feeling increasingly tired this morning from the sleep medications last night. Pt expressed having a hard time waking up this morning. Pt verbalized to writer that she have to take xanax every 8 hours because she's been taking it for years. Pt expressed that she's considering getting off of the xanax but she doesn't feel like she could function without it.  Medications administered as ordered per MD. Verbal support provided. Pt encouraged to attend groups. 15 minute checks performed for safety.  Pt compliant with tx plan.

## 2018-03-17 NOTE — Progress Notes (Signed)
Chaplain follow up for continued support around grief.  Kamyah related she has been thinking of her sister and working to hold on to good memories and think of next steps vs "being stuckDispensing optician provided pastoral presence and prayers with [place].  [place] is appreciative of time at [facility], and recognizes this is something her sister would wish for her.    Jerene Pitch, MDiv, [facility]

## 2018-03-17 NOTE — Tx Team (Signed)
Interdisciplinary Treatment and Diagnostic Plan Update  03/17/2018 Time of Session:  Kathleen Palmer MRN: [id number]  Principal Diagnosis: MDD (major depressive disorder), recurrent severe, without psychosis ([place])  Secondary Diagnoses: Principal Problem:   MDD (major depressive disorder), recurrent severe, without psychosis ([place]) Active Problems:   Subclinical hypothyroidism   Chronic hepatitis C ([place])   Thrombocytopenia ([place])   Current Medications:  Current Facility-Administered Medications  Medication Dose Route Frequency Provider Last Rate Last Dose  . ALPRAZolam Duanne Moron) tablet 1 mg  1 mg Oral TID PRN Sharma Covert, MD   1 mg at 03/17/18 1437  . Desvenlafaxine Succinate ER TB24 25 mg  25 mg Oral Daily Cobos, Myer Peer, MD   25 mg at 03/17/18 1149  . levothyroxine (SYNTHROID, LEVOTHROID) tablet 25 mcg  25 mcg Oral Q0600 Annita Brod, MD   25 mcg at 03/17/18 0615  . traZODone (DESYREL) tablet 100 mg  100 mg Oral QHS PRN Cobos, Myer Peer, MD   100 mg at 03/16/18 2222   PTA Medications: Medications Prior to Admission  Medication Sig Dispense Refill Last Dose  . ALPRAZolam (XANAX) 1 MG tablet Take 1 tablet (1 mg total) by mouth 3 (three) times daily. 90 tablet 2   . Cyanocobalamin (B-12) 500 MCG TABS Take 1 tablet by mouth daily.   Taking  . FLUoxetine (PROZAC) 20 MG capsule Take one daily for 2 weeks, then increase to one twice a day 60 capsule 2   . hydrOXYzine (ATARAX/VISTARIL) 25 MG tablet Take 1 tablet (25 mg total) by mouth every 6 (six) hours as needed for anxiety. 90 tablet 2   . linaclotide (LINZESS) 72 MCG capsule Take 1 capsule (72 mcg total) by mouth daily before breakfast. 90 capsule 3 Taking  . Multiple Vitamins-Minerals (CENTRUM SILVER PO) Take 1 tablet by mouth daily.   Taking  . traZODone (DESYREL) 100 MG tablet Take 2 tablets (200 mg total) by mouth at bedtime as needed for sleep. 60 tablet 2     Patient Stressors: Health problems Traumatic  event  Patient Strengths: Average or above average intelligence Communication skills General fund of knowledge Supportive family/friends  Treatment Modalities: Medication Management, Group therapy, Case management,  1 to 1 session with clinician, Psychoeducation, Recreational therapy.   Physician Treatment Plan for Primary Diagnosis: MDD (major depressive disorder), recurrent severe, without psychosis ([place]) Long Term Goal(s): Improvement in symptoms so as ready for discharge Improvement in symptoms so as ready for discharge   Short Term Goals: Ability to identify changes in lifestyle to reduce recurrence of condition will improve Ability to maintain clinical measurements within normal limits will improve Ability to identify changes in lifestyle to reduce recurrence of condition will improve Ability to verbalize feelings will improve Ability to disclose and discuss suicidal ideas Ability to demonstrate self-control will improve Ability to identify and develop effective coping behaviors will improve Ability to maintain clinical measurements within normal limits will improve  Medication Management: Evaluate patient's response, side effects, and tolerance of medication regimen.  Therapeutic Interventions: 1 to 1 sessions, Unit Group sessions and Medication administration.  Evaluation of Outcomes: Progressing  Physician Treatment Plan for Secondary Diagnosis: Principal Problem:   MDD (major depressive disorder), recurrent severe, without psychosis ([place]) Active Problems:   Subclinical hypothyroidism   Chronic hepatitis C ([place])   Thrombocytopenia ([place])  Long Term Goal(s): Improvement in symptoms so as ready for discharge Improvement in symptoms so as ready for discharge   Short Term Goals: Ability to identify changes  in lifestyle to reduce recurrence of condition will improve Ability to maintain clinical measurements within normal limits will improve Ability to identify changes in  lifestyle to reduce recurrence of condition will improve Ability to verbalize feelings will improve Ability to disclose and discuss suicidal ideas Ability to demonstrate self-control will improve Ability to identify and develop effective coping behaviors will improve Ability to maintain clinical measurements within normal limits will improve     Medication Management: Evaluate patient's response, side effects, and tolerance of medication regimen.  Therapeutic Interventions: 1 to 1 sessions, Unit Group sessions and Medication administration.  Evaluation of Outcomes: Progressing   RN Treatment Plan for Primary Diagnosis: MDD (major depressive disorder), recurrent severe, without psychosis ([place]) Long Term Goal(s): Knowledge of disease and therapeutic regimen to maintain health will improve  Short Term Goals: Ability to verbalize feelings will improve, Ability to disclose and discuss suicidal ideas and Ability to identify and develop effective coping behaviors will improve  Medication Management: RN will administer medications as ordered by provider, will assess and evaluate patient's response and provide education to patient for prescribed medication. RN will report any adverse and/or side effects to prescribing provider.  Therapeutic Interventions: 1 on 1 counseling sessions, Psychoeducation, Medication administration, Evaluate responses to treatment, Monitor vital signs and CBGs as ordered, Perform/monitor CIWA, COWS, AIMS and Fall Risk screenings as ordered, Perform wound care treatments as ordered.  Evaluation of Outcomes: Progressing   LCSW Treatment Plan for Primary Diagnosis: MDD (major depressive disorder), recurrent severe, without psychosis ([place]) Long Term Goal(s): Safe transition to appropriate next level of care at discharge, Engage patient in therapeutic group addressing interpersonal concerns.  Short Term Goals: Engage patient in aftercare planning with referrals and  resources  Therapeutic Interventions: Assess for all discharge needs, 1 to 1 time with Social worker, Explore available resources and support systems, Assess for adequacy in community support network, Educate family and significant other(s) on suicide prevention, Complete Psychosocial Assessment, Interpersonal group therapy.  Evaluation of Outcomes: Progressing   Progress in Treatment: Attending groups: Yes. Participating in groups: Yes. Taking medication as prescribed: Yes. Toleration medication: Yes. Family/Significant other contact made: No, will contact:  the patient's husband; CSW attempted one time Patient understands diagnosis: Yes. Discussing patient identified problems/goals with staff: Yes. Medical problems stabilized or resolved: Yes. Denies suicidal/homicidal ideation: Yes. Issues/concerns per patient self-inventory: No. Other:   New problem(s) identified: None   New Short Term/Long Term Goal(s): medication stabilization, elimination of SI thoughts, development of comprehensive mental wellness plan.    Patient Goals: "I want to eliminate suicidal thoughts and I want to realize that everyone passes away"  Discharge Plan or Barriers: Patient plans to discharge home with her husband and continue to follow up with [name] and Hinda Glatter at [facility] in [place]. MD has mentioned possible ECT, however the patient has not made a decision as of yet. CSW will continue to follow.   Reason for Continuation of Hospitalization: Anxiety Depression Medication stabilization Suicidal ideation  Estimated Length of Stay: 3-5 days   Attendees: Patient: Kathleen Palmer  03/17/2018 2:48 PM  Physician: Dr. Neita Garnet, MD; Dr. Myles Lipps, MD 03/17/2018 2:48 PM  Nursing: Elberta Fortis, RN; Sharl Ma. Viona Gilmore, RN  03/17/2018 2:48 PM  RN Care Manager: Lars Pinks  03/17/2018 2:48 PM  Social Worker: Radonna Ricker, [place] 03/17/2018 2:48 PM  Recreational Therapist: Rhunette Croft  03/17/2018 2:48 PM  Other: Agustina Caroli, NP  03/17/2018 2:48 PM  Other:  03/17/2018 2:48 PM  Other:  03/17/2018 2:48 PM    Scribe for Treatment Team: Marylee Floras, [place] 03/17/2018 2:48 PM

## 2018-03-18 ENCOUNTER — Ambulatory Visit (HOSPITAL_COMMUNITY): Payer: Self-pay

## 2018-03-18 LAB — CBC WITH DIFFERENTIAL/PLATELET
ABS IMMATURE GRANULOCYTES: 0.01 10*3/uL (ref 0.00–0.07)
BASOS ABS: 0 10*3/uL (ref 0.0–0.1)
Basophils Relative: 0 %
Eosinophils Absolute: 0 10*3/uL (ref 0.0–0.5)
Eosinophils Relative: 1 %
HEMATOCRIT: 43.5 % (ref 36.0–46.0)
HEMOGLOBIN: 14 g/dL (ref 12.0–15.0)
Immature Granulocytes: 0 %
LYMPHS ABS: 1.2 10*3/uL (ref 0.7–4.0)
LYMPHS PCT: 30 %
MCH: 29.9 pg (ref 26.0–34.0)
MCHC: 32.2 g/dL (ref 30.0–36.0)
MCV: 92.9 fL (ref 80.0–100.0)
Monocytes Absolute: 0.3 10*3/uL (ref 0.1–1.0)
Monocytes Relative: 9 %
NEUTROS ABS: 2.4 10*3/uL (ref 1.7–7.7)
Neutrophils Relative %: 60 %
Platelets: 88 10*3/uL — ABNORMAL LOW (ref 150–400)
RBC: 4.68 MIL/uL (ref 3.87–5.11)
RDW: 14.8 % (ref 11.5–15.5)
WBC: 3.9 10*3/uL — AB (ref 4.0–10.5)
nRBC: 0 % (ref 0.0–0.2)

## 2018-03-18 LAB — HEPATIC FUNCTION PANEL
ALBUMIN: 3.7 g/dL (ref 3.5–5.0)
ALT: 314 U/L — ABNORMAL HIGH (ref 0–44)
AST: 372 U/L — AB (ref 15–41)
Alkaline Phosphatase: 80 U/L (ref 38–126)
BILIRUBIN TOTAL: 1.2 mg/dL (ref 0.3–1.2)
Bilirubin, Direct: 0.4 mg/dL — ABNORMAL HIGH (ref 0.0–0.2)
Indirect Bilirubin: 0.8 mg/dL (ref 0.3–0.9)
Total Protein: 7 g/dL (ref 6.5–8.1)

## 2018-03-18 NOTE — Progress Notes (Signed)
D: Pt was in dayroom upon initial approach.  Pt presents with anxious affect and mood.  She states "I'm doing a lot better.  They're going to let me go home tomorrow."  Pt expressed feeling "happy" and "apprehensive" regarding discharge.  She reports feeling apprehensive because "I'm going to have some talks with my husband."  Pt denies SI/HI, denies hallucinations, reports R hip pain of 8/10.  Pt has been visible in milieu interacting with peers and staff appropriately.  Pt attended evening group.    A: Introduced self to pt.  Met with pt 1:1.  Actively listened to pt and offered support and encouragement.   PRN medication administered for sleep.  Q15 minute safety checks maintained.  R: Pt is safe on the unit.  Pt is compliant with medications.  Pt verbally contracts for safety.  Will continue to monitor and assess.

## 2018-03-18 NOTE — Progress Notes (Signed)
D:  [name] self inventory sheet, patient sleeps good, no sleep medication.  Fair appetite, low energy level, good concentration.  Rated depression 4, hopeless 2, anxiety 3.  Denied withdrawals.  Denied SI thoughts, contracts for safety.  Physical problems, pain, hernia hip, worst pain #7 in past 24 hours.  Plans to talk to MD about medications.  Plans to smile.  No discharge plans. A:  Medications administered per MD orders.  Emotional support and encouragement given patient. R:  Denied SI and HI, contracts for safety.  Denied A/V hallucinations.  Safety maintained with 15 minute checks.

## 2018-03-18 NOTE — Progress Notes (Signed)
Patient ID: Kathleen Palmer, female   DOB: [birth year], 57 y.o.   MRN: [id number] PER STATE REGULATIONS 482.30  THIS CHART WAS REVIEWED FOR MEDICAL NECESSITY WITH RESPECT TO THE [name] ADMISSION/ DURATION OF STAY.  NEXT REVIEW DATE: 03/22/2018 Chauncy Lean, RN, BSN CASE MANAGER
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
## 2018-03-18 NOTE — Plan of Care (Signed)
Nurse discussed anxiety, depression, coping skills with patient. 

## 2018-03-18 NOTE — Progress Notes (Signed)
[facility] MD Progress Note  03/18/2018 10:25 AM Kathleen Palmer  MRN:  [id number] Subjective:  Patient is seen and examined. Patient is a 57 year old female with a past psychiatric history significant for major depression, posttraumatic stress disorder, chronic pain, hepatitis C who was admitted on 03/11/2018 with worsening depression and suicidal ideation.  Objective: Patient is seen and examined.  Patient is a 57 year old female with the above-stated past psychiatric history who is seen in follow-up.   Patient stated she continues to feel better every day.  She stated her mood is better.  She stated that she is processing the death of her sister, and that seems to be helping her a great deal.  She is tolerating the Pristiq, and not having any side effects from it.  She stated that her mood was an 8 out of 10 today.  Her sleep is good.  Her laboratories returned, and her AST is beginning to drop, and her ALT only increased slightly.  She denied any suicidal ideation today.  Her vital signs are stable, she is afebrile.  She slept 6.75 hours last night.  Principal Problem: MDD (major depressive disorder), recurrent severe, without psychosis ([place]) Diagnosis: Principal Problem:   MDD (major depressive disorder), recurrent severe, without psychosis ([place]) Active Problems:   Subclinical hypothyroidism   Chronic hepatitis C ([place])   Thrombocytopenia ([place])  Total Time spent with patient: 15 minutes  Past Psychiatric History: See admission H&P  Past Medical History:  Past Medical History:  Diagnosis Date  . Angiomyolipoma    Left kidney  . Anxiety   . Arthritis   . Attention to urostomy [facility])   . Chronic pain   . Depression   . Hepatitis C    ? Contracted through IVDA  . Panic 04/29/1999  . PTSD (post-traumatic stress disorder)   . Substance abuse ([place])    Remote history- cocaine, ETOH, Marijuana  . Thyroid disease     Past Surgical History:  Procedure Laterality Date  . ABDOMINAL HYSTERECTOMY     . ABDOMINAL SURGERY    . BIOPSY  01/27/2017   Procedure: BIOPSY;  Surgeon: Danie Binder, MD;  [facility];  Service: Endoscopy;;  gastric  . CHOLECYSTECTOMY    . COLONOSCOPY WITH PROPOFOL N/A 01/27/2017   Procedure: COLONOSCOPY WITH PROPOFOL;  Surgeon: Danie Binder, MD;  Location: [facility] END[facility];  Service: Endoscopy;  Laterality: N/A;  12:00pm  . ESOPHAGOGASTRODUODENOSCOPY (EGD) WITH PROPOFOL N/A 01/27/2017   Procedure: ESOPHAGOGASTRODUODENOSCOPY (EGD) WITH PROPOFOL;  Surgeon: Danie Binder, MD;  Location: [facility];  Service: Endoscopy;  Laterality: N/A;  . HERNIA REPAIR     x2- abdomen  . ILEO LOOP CONDUIT    . 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 VAGINA RECONSTRUCTION SURGERY     Family History:  Family History  Adopted: Yes  Problem Relation Age of Onset  . Depression Mother   . Depression Sister   . Anxiety disorder Sister   . Bipolar disorder Sister   . Depression Maternal Grandmother   . ADD / ADHD Neg Hx   . Alcohol abuse Neg Hx   . Drug abuse Neg Hx   . Dementia Neg Hx   . OCD Neg Hx   . Paranoid behavior Neg Hx   . Schizophrenia Neg Hx   . Seizures Neg Hx   . Sexual abuse  Neg Hx   . Physical abuse Neg Hx   . Suicidality Neg Hx   . Colon cancer Neg Hx    Family Psychiatric  History: See admission H&P Social History:  Social History   Substance and Sexual Activity  Alcohol Use No  . Alcohol/week: 0.0 standard drinks     Social History   Substance and Sexual Activity  Drug Use Yes  . Types: Marijuana   Comment: cocaine back in the 1980s; Marijuana rarely    Social History   Socioeconomic History  . Marital status: Married    Spouse name: Not on file  . Number of children: 0  . Years of education: Not on file  . Highest education level: Not on file  Occupational History  . Occupation: disability  Social Needs   . Financial resource strain: Not on file  . Food insecurity:    Worry: Not on file    Inability: Not on file  . Transportation needs:    Medical: Not on file    Non-medical: Not on file  Tobacco Use  . Smoking status: Former Smoker    Packs/day: 0.25    Years: 33.00    Pack years: 8.25    Types: Cigarettes    Last attempt to quit: 01/21/2015    Years since quitting: 3.1  . Smokeless tobacco: Never Used  . Tobacco comment: 9-10 cigs a day as of 10/20/2012, (02-07-15 per pt, she stopped smoking 01-21-15)  Substance and Sexual Activity  . Alcohol use: No    Alcohol/week: 0.0 standard drinks  . Drug use: Yes    Types: Marijuana    Comment: cocaine back in the 1980s; Marijuana rarely  . Sexual activity: Yes    Birth control/protection: Surgical  Lifestyle  . Physical activity:    Days per week: Not on file    Minutes per session: Not on file  . Stress: Not on file  Relationships  . Social connections:    Talks on phone: Not on file    Gets together: Not on file    Attends religious service: Not on file    Active member of club or organization: Not on file    Attends meetings of clubs or organizations: Not on file    Relationship status: Not on file  Other Topics Concern  . Not on file  Social History Narrative  . Not on file   Additional Social History:    Pain Medications: see MAR Prescriptions: see MAR Over the Counter: see MAR History of alcohol / drug use?: No history of alcohol / drug abuse                    Sleep: Good  Appetite:  Good  Current Medications: Current Facility-Administered Medications  Medication Dose Route Frequency Provider Last Rate Last Dose  . ALPRAZolam Duanne Moron) tablet 1 mg  1 mg Oral TID PRN Sharma Covert, MD   1 mg at 03/18/18 [phone]  . Desvenlafaxine Succinate ER TB24 25 mg  25 mg Oral Daily Cobos, Myer Peer, MD   25 mg at 03/18/18 0800  . levothyroxine (SYNTHROID, LEVOTHROID) tablet 25 mcg  25 mcg Oral Q0600 Annita Brod, MD   25 mcg at 03/18/18 0641  . traZODone (DESYREL) tablet 100 mg  100 mg Oral QHS PRN Cobos, Myer Peer, MD   100 mg at 03/17/18 2227    Lab Results:  Results for orders placed or performed during the hospital encounter of  03/10/18 (from the past 48 hour(s))  APTT     Status: None   Collection Time: 03/16/18  6:29 PM  Result Value Ref Range   aPTT 33 24 - 36 seconds    Comment: Performed at [facility], [place] [address], [place], [place] [address]  Protime-INR     Status: None   Collection Time: 03/16/18  6:29 PM  Result Value Ref Range   Prothrombin Time 14.9 11.4 - 15.2 seconds   INR 1.18     Comment: Performed at [facility], [place] [address] [place], [place] [address]  Ammonia     Status: None   Collection Time: 03/16/18  6:29 PM  Result Value Ref Range   Ammonia 25 9 - 35 umol/L    Comment: Performed at [facility], [place] [address], [place], [place] [address]  Hepatic function panel     Status: Abnormal   Collection Time: 03/18/18  6:32 AM  Result Value Ref Range   Total Protein 7.0 6.5 - 8.1 g/dL   Albumin 3.7 3.5 - 5.0 g/dL   AST 372 (H) 15 - 41 U/L   ALT 314 (H) 0 - 44 U/L   Alkaline Phosphatase 80 38 - 126 U/L   Total Bilirubin 1.2 0.3 - 1.2 mg/dL   Bilirubin, Direct 0.4 (H) 0.0 - 0.2 mg/dL   Indirect Bilirubin 0.8 0.3 - 0.9 mg/dL    Comment: Performed at [facility], [place] [address], [place], [place] [address]  CBC with Differential/Platelet     Status: Abnormal   Collection Time: 03/18/18  6:32 AM  Result Value Ref Range   WBC 3.9 (L) 4.0 - 10.5 K/uL   RBC 4.68 3.87 - 5.11 MIL/uL   Hemoglobin 14.0 12.0 - 15.0 g/dL   HCT 43.5 36.0 - 46.0 %   MCV 92.9 80.0 - 100.0 fL   MCH 29.9 26.0 - 34.0 pg   MCHC 32.2 30.0 - 36.0 g/dL   RDW 14.8 11.5 - 15.5 %   Platelets 88 (L) 150 - 400 K/uL    Comment: Immature Platelet Fraction may be clinically indicated, consider ordering this  additional test [id number]    nRBC 0.0 0.0 - 0.2 %   Neutrophils Relative % 60 %   Neutro Abs 2.4 1.7 - 7.7 K/uL   Lymphocytes Relative 30 %   Lymphs Abs 1.2 0.7 - 4.0 K/uL   Monocytes Relative 9 %   Monocytes Absolute 0.3 0.1 - 1.0 K/uL   Eosinophils Relative 1 %   Eosinophils Absolute 0.0 0.0 - 0.5 K/uL   Basophils Relative 0 %   Basophils Absolute 0.0 0.0 - 0.1 K/uL   Immature Granulocytes 0 %   Abs Immature Granulocytes 0.01 0.00 - 0.07 K/uL    Comment: Performed at [facility], [place] [address], [place], [place] [address]    Blood Alcohol level:  Lab Results  Component Value Date   [facility] <10 03/10/2018   ETH <5 02/19/8526    Metabolic Disorder Labs: Lab Results  Component Value Date   HGBA1C 5.2 03/10/2018   MPG 102.54 03/10/2018   MPG 102.54 12/05/2016   No results found for: PROLACTIN Lab Results  Component Value Date   CHOL 137 03/10/2018   TRIG 78 03/10/2018   HDL 37 (L) 03/10/2018   CHOLHDL 3.7 03/10/2018   VLDL 16 03/10/2018   LDLCALC 84 03/10/2018   LDLCALC 70 12/05/2016    Physical Findings: AIMS:  Facial and Oral Movements Muscles of Facial Expression: None, normal Lips and Perioral Area: None, normal Jaw: None, normal Tongue: None, normal,Extremity Movements Upper (arms, wrists, hands, fingers): None, normal Lower (legs, knees, ankles, toes): None, normal, Trunk Movements Neck, shoulders, hips: None, normal, Overall Severity Severity of abnormal movements (highest score from questions above): None, normal Incapacitation due to abnormal movements: None, normal [name] awareness of abnormal movements (rate only patient's report): No Awareness, Dental Status Current problems with teeth and/or dentures?: No Does patient usually wear dentures?: No  CIWA:    COWS:     Musculoskeletal: Strength & Muscle Tone: within normal limits Gait & Station: normal Patient leans: N/A  Psychiatric Specialty Exam: Physical Exam  Nursing  note and vitals reviewed. Constitutional: She is oriented to person, place, and time. She appears well-developed and well-nourished.  HENT:  Head: Normocephalic and atraumatic.  Respiratory: Effort normal.  Neurological: She is alert and oriented to person, place, and time.    ROS  Blood pressure 127/79, pulse 97, temperature 98.1 F (36.7 C), temperature source Oral, resp. rate 20, height 5\' 3"  (1.6 m), weight 80.3 kg, SpO2 98 %.Body mass index is 31.35 kg/m.  General Appearance: Casual  Eye Contact:  Good  Speech:  Normal Rate  Volume:  Normal  Mood:  Euthymic  Affect:  Congruent  Thought Process:  Coherent and Descriptions of Associations: Intact  Orientation:  Full (Time, Place, and Person)  Thought Content:  Logical  Suicidal Thoughts:  No  Homicidal Thoughts:  No  Memory:  Immediate;   Fair Recent;   Fair Remote;   Fair  Judgement:  Intact  Insight:  Fair  Psychomotor Activity:  Normal  Concentration:  Concentration: Fair and Attention Span: Fair  Recall:  [employer] of Knowledge:  Good  Language:  Good  Akathisia:  Negative  Handed:  Right  AIMS (if indicated):     Assets:  Communication Skills Desire for Improvement Financial Resources/Insurance Housing Leisure Time Resilience Social Support  ADL's:  Intact  Cognition:  WNL  Sleep:  Number of Hours: 6.75     Treatment Plan Summary: Daily contact with patient to assess and evaluate symptoms and progress in treatment, Medication management and Plan  : Patient is seen and examined.  Patient is a 57 year old female with a past psychiatric history significant for major depression, posttraumatic stress disorder, generalized anxiety disorder.  Patient continues to improve.  Her mood is improving, her anxiety is decreased, and she continues to deny any suicidal ideation.  She is tolerating the Pristiq without difficulty. #1 continue Pristiq 25 mg p.o. nightly for mood stability and anxiety. #2 continue Xanax 1 mg  p.o. 3 times daily as needed anxiety. #3 continue trazodone 100 mg p.o. nightly as needed insomnia. #4 liver function enzymes stabilizing. #5 continue processing death of sister and coping skills. #6 disposition planning-in progress, if all continues to go well we will plan discharge in the next 2 days.    Sharma Covert, MD 03/18/2018, 10:25 AM

## 2018-03-19 ENCOUNTER — Ambulatory Visit (HOSPITAL_COMMUNITY): Payer: Medicare Other | Admitting: Psychiatry

## 2018-03-19 MED ORDER — DESVENLAFAXINE SUCCINATE ER 25 MG PO TB24: 25.0000 mg | ORAL_TABLET | Freq: Every day | ORAL | 0 refills | Status: DC

## 2018-03-19 MED ORDER — LEVOTHYROXINE SODIUM 25 MCG PO TABS: 25.0000 ug | ORAL_TABLET | Freq: Every day | ORAL | 0 refills | Status: DC

## 2018-03-19 NOTE — BHH Suicide Risk Assessment (Signed)
[facility] Discharge Suicide Risk Assessment   Principal Problem: MDD (major depressive disorder), recurrent severe, without psychosis ([place]) Discharge Diagnoses: Principal Problem:   MDD (major depressive disorder), recurrent severe, without psychosis ([place]) Active Problems:   Subclinical hypothyroidism   Chronic hepatitis C ([place])   Thrombocytopenia ([place])   Total Time spent with patient: 15 minutes  Musculoskeletal: Strength & Muscle Tone: within normal limits Gait & Station: normal Patient leans: N/A  Psychiatric Specialty Exam: Review of Systems  All other systems reviewed and are negative.   Blood pressure 129/84, pulse 89, temperature 97.7 F (36.5 C), temperature source Oral, resp. rate 20, height 5\' 3"  (1.6 m), weight 80.3 kg, SpO2 98 %.Body mass index is 31.35 kg/m.  General Appearance: Casual  Eye Contact::  Fair  Speech:  Normal Rate409  Volume:  Normal  Mood:  Anxious  Affect:  Congruent  Thought Process:  Coherent and Descriptions of Associations: Intact  Orientation:  Full (Time, Place, and Person)  Thought Content:  Logical  Suicidal Thoughts:  No  Homicidal Thoughts:  No  Memory:  Immediate;   Fair Recent;   Fair Remote;   Fair  Judgement:  Intact  Insight:  Fair  Psychomotor Activity:  Normal  Concentration:  Good  Recall:  Good  Fund of Knowledge:Good  Language: Good  Akathisia:  Negative  Handed:  Right  AIMS (if indicated):     Assets:  Communication Skills Desire for Improvement Financial Resources/Insurance Housing Resilience Social Support  Sleep:  Number of Hours: 6.75  Cognition: WNL  ADL's:  Intact   Mental Status Per Nursing Assessment::   On Admission:  Self-harm thoughts, Intention to act on suicide plan, Belief that plan would result in death  Demographic Factors:  Caucasian, Low socioeconomic status and Unemployed  Loss Factors: Loss of significant relationship and Decline in physical health  Historical  Factors: Impulsivity  Risk Reduction Factors:   Sense of responsibility to family, Living with another person, especially a relative and Positive social support  Continued Clinical Symptoms:  Depression:   Impulsivity Medical Diagnoses and Treatments/Surgeries  Cognitive Features That Contribute To Risk:  None    Suicide Risk:  Minimal: No identifiable suicidal ideation.  Patients presenting with no risk factors but with morbid ruminations; may be classified as minimal risk based on the severity of the depressive symptoms  Follow-up Information    [facility]. Go on 03/22/2018.   Why:  Please attend your therapy appointment is Monday, 03/22/18 at 11:00a.  Medication managment appointment with [name] on Tuesday, 03/30/18 at 10:45a.  Contact information: [address] [place] [address] phone: [phone] fax: [phone]           Plan Of Care/Follow-up recommendations:  Activity:  ad lib  Sharma Covert, MD 03/19/2018, 7:15 AM

## 2018-03-19 NOTE — Progress Notes (Signed)
Recreation Therapy Notes  Date: 03/19/18 Time: 0930 Location: [address] Dayroom  Group Topic: Stress Management  Goal Area(s) Addresses:  Patient will verbalize importance of using healthy stress management.  Patient will identify positive emotions associated with healthy stress management.   Behavioral Response: Engaged  Intervention: Stress Management  Activity :  Meditation.  LRT introduced the stress management technique of meditation to the patients.  LRT played a meditation that focused on being resilient in the face of challenge.  Patients were to listen and follow along as the meditation was played to engage in activity.  Education:  Stress Management, Discharge Planning.   Education Outcome: Acknowledges edcuation/In group clarification offered/Needs additional education  Clinical Observations/Feedback: Pt attended and participated in activity.     Victorino Sparrow, LRT/CTRS         Victorino Sparrow A 03/19/2018 12:41 PM

## 2018-03-19 NOTE — Progress Notes (Signed)
  [facility] Adult Case Management Discharge Plan :  Will you be returning to the same living situation after discharge:  Yes,  patient reports she is discharging home with her husband At discharge, do you have transportation home?: Yes,  patient reports her husband is picking her up at discharge Do you have the ability to pay for your medications: Yes,  SSDI, Medicare   Release of information consent forms completed and in the chart;  [name] signature needed at discharge.  Patient to Follow up at: Follow-up Information    [facility]. Go on 03/22/2018.   Why:  Please attend your therapy appointment is Monday, 03/22/18 at 11:00a.  Medication managment appointment with [name] on Tuesday, 03/30/18 at 10:45a.  Contact information: [address] [place] [address] phone: [phone] fax: [phone]        [place] [place] [address]. Go on 04/06/2018.   Specialty:  Nurse Practitioner Why:  Appointment for hepatology follow up services is Tuesday, 04/06/2018 at 9:30am with [place], [place]. Please be sure to bring any discharge paperwork from this hospitalization including updated list of medications.  Contact information: [address] [place] [address] [phone]           Next level of care provider has access to [place] and Suicide Prevention discussed: Yes,  with the patient, attempted to contact husband multiple times  Have you used any form of tobacco in the last 30 days? (Cigarettes, Smokeless Tobacco, Cigars, and/or Pipes): No  Has patient been referred to the Quitline?: N/A patient is not a smoker  Patient has been referred for addiction treatment: N/A  Kathleen Palmer, [place] 03/19/2018, 2:38 PM

## 2018-03-19 NOTE — Progress Notes (Signed)
Patient ID: Kathleen Palmer, female   DOB: 1960-05-13, 57 y.o.   MRN: [id number]  D: Pt alert and oriented on the unit.   A: Education, support, and encouragement provided. Discharge summary, medications and follow up appointments reviewed with pt. Suicide prevention resources provided, including "My 3 App." Pt's belongings in locker # 36 returned and belongings sheet signed.  R: Pt denies SI/HI, A/VH, pain, or any concerns at this time. Pt ambulatory on and off unit. Pt discharged to lobby.

## 2018-03-19 NOTE — Discharge Summary (Signed)
Physician Discharge Summary Note  Patient:  Kathleen Palmer is an 57 y.o., female MRN:  [id number] DOB:  1960-12-06 Patient phone:  [phone] (home)  Patient address:   [place] [address],  Total Time spent with patient: 20 minutes  Date of Admission:  03/10/2018 Date of Discharge: 03/19/18  Reason for Admission:  Worsening depression with SI  Principal Problem: MDD (major depressive disorder), recurrent severe, without psychosis ([place]) Discharge Diagnoses: Principal Problem:   MDD (major depressive disorder), recurrent severe, without psychosis ([place]) Active Problems:   Subclinical hypothyroidism   Chronic hepatitis C ([place])   Thrombocytopenia ([place])   Past Psychiatric History: history of several prior psychiatric admissions for depression, and describes history of chronic depression, " basically all my life ". Denies history of mania/hypomania. Denies history of psychosis. History of suicidal ideations and (+) history of overdoses. Reports history of Panic Attacks, Agoraphobia. Denies history of violence   Past Medical History:  Past Medical History:  Diagnosis Date  . Angiomyolipoma    Left kidney  . Anxiety   . Arthritis   . Attention to urostomy [facility])   . Chronic pain   . Depression   . Hepatitis C    ? Contracted through [facility]  . Panic 04/29/1999  . PTSD (post-traumatic stress disorder)   . Substance abuse ([place])    Remote history- cocaine, ETOH, Marijuana  . Thyroid disease     Past Surgical History:  Procedure Laterality Date  . ABDOMINAL HYSTERECTOMY    . ABDOMINAL SURGERY    . BIOPSY  01/27/2017   Procedure: BIOPSY;  Surgeon: Danie Binder, MD;  Location[facility];  Service: Endoscopy;;  gastric  . CHOLECYSTECTOMY    . COLONOSCOPY WITH PROPOFOL N/A 01/27/2017   Procedure: COLONOSCOPY WITH PROPOFOL;  Surgeon: Danie Binder, MD;  Location: [facility];  Service: Endoscopy;  Laterality: N/A;  12:00pm  . ESOPHAGOGASTRODUODENOSCOPY (EGD) WITH  PROPOFOL N/A 01/27/2017   Procedure: ESOPHAGOGASTRODUODENOSCOPY (EGD) WITH PROPOFOL;  Surgeon: Danie Binder, MD;  Location: [facility];  Service: Endoscopy;  Laterality: N/A;  . HERNIA REPAIR     x2- abdomen  . ILEO LOOP CONDUIT    . 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 VAGINA RECONSTRUCTION SURGERY     Family History:  Family History  Adopted: Yes  Problem Relation Age of Onset  . Depression Mother   . Depression Sister   . Anxiety disorder Sister   . Bipolar disorder Sister   . Depression Maternal Grandmother   . ADD / ADHD Neg Hx   . Alcohol abuse Neg Hx   . Drug abuse Neg Hx   . Dementia Neg Hx   . OCD Neg Hx   . Paranoid behavior Neg Hx   . Schizophrenia Neg Hx   . Seizures Neg Hx   . Sexual abuse Neg Hx   . Physical abuse Neg Hx   . Suicidality Neg Hx   . Colon cancer Neg Hx    Family Psychiatric  History: Patient reports she was adopted, states that there is a strong history of mental illness in her biological family. Biological mother committed suicide and sister had history of depression Social History:  Social History   Substance and Sexual Activity  Alcohol Use No  . Alcohol/week: 0.0 standard drinks     Social History  Substance and Sexual Activity  Drug Use Yes  . Types: Marijuana   Comment: cocaine back in the 1980s; Marijuana rarely    Social History   Socioeconomic History  . Marital status: Married    Spouse name: Not on file  . Number of children: 0  . Years of education: Not on file  . Highest education level: Not on file  Occupational History  . Occupation: disability  Social Needs  . Financial resource strain: Not on file  . Food insecurity:    Worry: Not on file    Inability: Not on file  . Transportation needs:    Medical: Not on file    Non-medical: Not on file  Tobacco Use  . Smoking status: Former  Smoker    Packs/day: 0.25    Years: 33.00    Pack years: 8.25    Types: Cigarettes    Last attempt to quit: 01/21/2015    Years since quitting: 3.1  . Smokeless tobacco: Never Used  . Tobacco comment: 9-10 cigs a day as of 10/20/2012, (02-07-15 per pt, she stopped smoking 01-21-15)  Substance and Sexual Activity  . Alcohol use: No    Alcohol/week: 0.0 standard drinks  . Drug use: Yes    Types: Marijuana    Comment: cocaine back in the 1980s; Marijuana rarely  . Sexual activity: Yes    Birth control/protection: Surgical  Lifestyle  . Physical activity:    Days per week: Not on file    Minutes per session: Not on file  . Stress: Not on file  Relationships  . Social connections:    Talks on phone: Not on file    Gets together: Not on file    Attends religious service: Not on file    Active member of club or organization: Not on file    Attends meetings of clubs or organizations: Not on file    Relationship status: Not on file  Other Topics Concern  . Not on file  Social History Narrative  . Not on file    Hospital Course:  03/10/18 [facility] Counselor Assessment:  57 y.o. female in [facility] as a walk in, being sent by her therapist after having a session with him this morning. Pt is extremely emotional and cries throughout length of assessment. Pt reports that her identical twin sister died 1.5 months ago and she hasn't been able to "get over it". Pt reports that she has gone to grief counseling and has had a med change, but doesn't feel any better. Pt reports SI earlier today.  Pt reports not feeling safe to leave the hospital and is unable to contract for safety.  03/11/18 [facility] MD Assessment: 57 year old married female, lives with husband, on disability. Presented to the hospital voluntarily  for worsening depression, frequent crying , neuro-vegetative symptoms of depression, suicidal ideations with thoughts of shooting self or cutting her wrists . She has a history of chronic depression,  but states it has worsened after her twin sister died from [place] 2 months ago. Reports she had been taking Cymbalta for several months, but states it was not helping so was switched to Prozac about a week ago, by her outpatient psychiatrist . Reports Prozac has helped in the past.   Patient remained on [facility] unit for 8 days. The patient stabilized on medication and therapy. Patient was discharged on Desvenlafaxine succinate ER 25 mg Daily, Xanax 1 mg TID PRN, Synthroid 25 mcg Daily, and Trazodone 100 mg QHS  PRN. Patient has shown improvement with improved mood, affect, sleep, appetite, and interaction. Patient has attended group and participated. Patient has been seen in the day room interacting with peers and staff appropriately. Patient denies any SI/HI/AVH and contracts for safety. Patient agrees to follow up at [facility]. CSW will also assist with scheduling follow-up appointment with hepatologist next week for her elevated LFTs. Patient is provided with prescriptions for their medications upon discharge.   Physical Findings: AIMS: Facial and Oral Movements Muscles of Facial Expression: None, normal Lips and Perioral Area: None, normal Jaw: None, normal Tongue: None, normal,Extremity Movements Upper (arms, wrists, hands, fingers): None, normal Lower (legs, knees, ankles, toes): None, normal, Trunk Movements Neck, shoulders, hips: None, normal, Overall Severity Severity of abnormal movements (highest score from questions above): None, normal Incapacitation due to abnormal movements: None, normal [name] awareness of abnormal movements (rate only patient's report): No Awareness, Dental Status Current problems with teeth and/or dentures?: No Does patient usually wear dentures?: No  CIWA:  CIWA-Ar Total: 1 COWS:  COWS Total Score: 2  Musculoskeletal: Strength & Muscle Tone: within normal limits Gait & Station: normal Patient leans: N/A  Psychiatric Specialty  Exam: Physical Exam  Nursing note and vitals reviewed. Constitutional: She is oriented to person, place, and time. She appears well-developed and well-nourished.  Cardiovascular: Normal rate.  Respiratory: Effort normal.  Musculoskeletal: Normal range of motion.  Neurological: She is alert and oriented to person, place, and time.  Skin: Skin is warm.    Review of Systems  Constitutional: Negative.   HENT: Negative.   Eyes: Negative.   Respiratory: Negative.   Cardiovascular: Negative.   Gastrointestinal: Negative.   Genitourinary: Negative.   Musculoskeletal: Negative.   Skin: Negative.   Neurological: Negative.   Endo/Heme/Allergies: Negative.   Psychiatric/Behavioral: The patient is nervous/anxious.     Blood pressure 129/84, pulse 89, temperature 97.7 F (36.5 C), temperature source Oral, resp. rate 20, height 5\' 3"  (1.6 m), weight 80.3 kg, SpO2 98 %.Body mass index is 31.35 kg/m.  General Appearance: Casual  Eye Contact:  Good  Speech:  Clear and Coherent and Normal Rate  Volume:  Normal  Mood:  Anxious about going home to alcoholic husband  Affect:  Congruent  Thought Process:  Coherent and Descriptions of Associations: Intact  Orientation:  Full (Time, Place, and Person)  Thought Content:  WDL  Suicidal Thoughts:  No  Homicidal Thoughts:  No  Memory:  Immediate;   Good Recent;   Good Remote;   Good  Judgement:  Good  Insight:  Fair  Psychomotor Activity:  Normal  Concentration:  Concentration: Good and Attention Span: Good  Recall:  Good  Fund of Knowledge:  Good  Language:  Good  Akathisia:  No  Handed:  Right  AIMS (if indicated):     Assets:  Communication Skills Desire for Improvement Financial Resources/Insurance Housing Resilience Transportation  ADL's:  Intact  Cognition:  WNL  Sleep:  Number of Hours: 6.75     Have you used any form of tobacco in the last 30 days? (Cigarettes, Smokeless Tobacco, Cigars, and/or Pipes): No  Has this patient  used any form of tobacco in the last 30 days? (Cigarettes, Smokeless Tobacco, Cigars, and/or Pipes) Yes, No  Blood Alcohol level:  Lab Results  Component Value Date   [facility] <10 03/10/2018   ETH <5 25/95/6387    Metabolic Disorder Labs:  Lab Results  Component Value Date   HGBA1C 5.2 03/10/2018  MPG 102.54 03/10/2018   MPG 102.54 12/05/2016   No results found for: PROLACTIN Lab Results  Component Value Date   CHOL 137 03/10/2018   TRIG 78 03/10/2018   HDL 37 (L) 03/10/2018   CHOLHDL 3.7 03/10/2018   VLDL 16 03/10/2018   LDLCALC 84 03/10/2018   LDLCALC 70 12/05/2016    See Psychiatric Specialty Exam and Suicide Risk Assessment completed by Attending Physician prior to discharge.  Discharge destination:  Home  Is patient on multiple antipsychotic therapies at discharge:  No   Has Patient had three or more failed trials of antipsychotic monotherapy by history:  No  Recommended Plan for Multiple Antipsychotic Therapies: NA   Allergies as of 03/19/2018      Reactions   Oxycodone Anxiety   Abilify [aripiprazole]    Stiff neck   Bactrim [sulfamethoxazole-trimethoprim]    Tongue swelled   Gabapentin    Dizziness to the point she actually fell   Ampicillin Rash   Has patient had a PCN reaction causing immediate rash, facial/tongue/throat swelling, SOB or lightheadedness with hypotension: Unknown Has patient had a PCN reaction causing severe rash involving mucus membranes or skin necrosis: Unknown Has patient had a PCN reaction that required hospitalization: No Has patient had a PCN reaction occurring within the last 10 years: No If all of the above answers are "NO", then may proceed with Cephalosporin use.      Medication List    STOP taking these medications   B-12 500 MCG Tabs   CENTRUM SILVER PO   FLUoxetine 20 MG capsule Commonly known as:  PROZAC   hydrOXYzine 25 MG tablet Commonly known as:  ATARAX/VISTARIL   linaclotide 72 MCG capsule Commonly known  as:  LINZESS     TAKE these medications     Indication  ALPRAZolam 1 MG tablet Commonly known as:  XANAX Take 1 tablet (1 mg total) by mouth 3 (three) times daily.  Indication:  Feeling Anxious, Panic Disorder   Desvenlafaxine Succinate ER 25 MG Tb24 Take 25 mg by mouth daily. For mood stability Start taking on:  03/20/2018  Indication:  Major Depressive Disorder   levothyroxine 25 MCG tablet Commonly known as:  SYNTHROID, LEVOTHROID Take 1 tablet (25 mcg total) by mouth daily at 6 (six) AM. For hypothyroidism Start taking on:  03/20/2018  Indication:  Underactive Thyroid   traZODone 100 MG tablet Commonly known as:  DESYREL Take 2 tablets (200 mg total) by mouth at bedtime as needed for sleep.  Indication:  Trouble Sleeping      Follow-up Information    [facility]. Go on 03/22/2018.   Why:  Please attend your therapy appointment is Monday, 03/22/18 at 11:00a.  Medication managment appointment with [name] on Tuesday, 03/30/18 at 10:45a.  Contact information: [address] [place] [address] phone: [phone] fax: [phone]           Follow-up recommendations:  Continue activity as tolerated. Continue diet as recommended by your PCP. Ensure to keep all appointments with outpatient providers.  Comments:  Patient is instructed prior to discharge to: Take all medications as prescribed by his/her mental healthcare provider. Report any adverse effects and or reactions from the medicines to his/her outpatient provider promptly. Patient has been instructed & cautioned: To not engage in alcohol and or illegal drug use while on prescription medicines. In the event of worsening symptoms, patient is instructed to call the crisis hotline, 911 and or go  to the nearest ED for appropriate evaluation and treatment of symptoms. To follow-up with his/her primary care provider for your other medical issues, concerns and or health care needs.     Signed: [place], FNP 03/19/2018, 8:47 AM

## 2018-03-19 NOTE — Progress Notes (Signed)
Adult Psychoeducational Group Note  Date:  03/19/2018 Time:  5:24 AM  Group Topic/Focus:  Wrap-Up Group:   The focus of this group is to help patients review their daily goal of treatment and discuss progress on daily workbooks.  Participation Level:  Active  Participation Quality:  Appropriate  Affect:  Appropriate  Cognitive:  Appropriate  Insight: Appropriate and Good  Engagement in Group:  Engaged  Modes of Intervention:  Discussion  Additional Co pt attend warp up group her eay was a 6. The one positive thing that happened to her she got a chance to eat  Barbette Hair 03/19/2018, 5:24 AM

## 2018-03-22 ENCOUNTER — Ambulatory Visit (INDEPENDENT_AMBULATORY_CARE_PROVIDER_SITE_OTHER): Payer: Medicare Other | Admitting: Licensed Clinical Social Worker

## 2018-03-22 ENCOUNTER — Encounter (HOSPITAL_COMMUNITY): Payer: Self-pay | Admitting: Licensed Clinical Social Worker

## 2018-03-22 DIAGNOSIS — F332 Major depressive disorder, recurrent severe without psychotic features: Secondary | ICD-10-CM | POA: Diagnosis not present

## 2018-03-22 NOTE — Progress Notes (Signed)
   THERAPIST PROGRESS NOTE  Session Time: 11:00 am -11:40 am  Participation Level: Active  Behavioral Response: CasualAlertEuthymic  Type of Therapy: Individual Therapy  Treatment Goals addressed: Coping  Interventions: CBT and Solution Focused  Summary: Kathleen Palmer is a 57 y.o. female who presents oriented x4 (person, place, situation, and object), distracted, casually groomed, causually dressed and cooperative to address depression. Patient has a history of medical issues and mental health treatment. She denies symptoms of mania. Patient denies suicidal and homicidal ideations. She denies psychosis including auditory and visual hallucinations. Patient admits to cannabis use. She is at low risk for lethality. Patient has been hospitalized 7 times due to suicidal ideations but states she has never attempted due to her religious faith.   Physical: Patient is now being treated for issues related to her thyroid. Her medication was changed as well.  Spiritual/value: Patient has a strong faith.  Relationships: Patient is mourning her sister's passing but is doing better with it. She has a short conversation with her daily but moves on with the rest of her day. Patient is concerned with her husbands alcohol use. She is going to bring it up but is going to focus on herself for now.  Emotional/Mental/Behavior: Patient went to the hospital and was hospitalized for 10 days. She feels better than before but recognizes the needs to continue to work on herself. Patient is allowing herself 10 minutes to grieve daily but moving on with her day after that. This allows her to feel but also to set a limit on it.   Patient engaged in session. She responded well to interventions. Patient continues to meet criteria for Major Depressive disorder, recurrent episode, severe with anxious distress. Patient will continue in outpatient therapy due to being the least restrictive service to meet her needs. Patient made  moderate progress on her goals at this time.   Suicidal/Homicidal: Negativewithout intent/plan  Therapist Response: Therapist reviewed patient's recent thoughts and behaviors. Therapist utilized CBT to address depression. Therapist reviewed patient's goals.  Therapist processed patient's feelings to identify triggers for mood. Therapist discussed patient's approach to managing grief and mood.  Plan: Return again in 4 weeks.        Diagnosis: Axis I: Major depressive disorder, recurrent episode, severe with anxious distress    Axis II: No diagnosis    [name], LCSW 03/22/2018

## 2018-03-30 ENCOUNTER — Ambulatory Visit (HOSPITAL_COMMUNITY): Payer: Medicare Other | Admitting: Psychiatry

## 2018-04-02 ENCOUNTER — Ambulatory Visit (HOSPITAL_COMMUNITY): Payer: Self-pay

## 2018-04-06 ENCOUNTER — Encounter (HOSPITAL_COMMUNITY): Payer: Self-pay | Admitting: Psychiatry

## 2018-04-06 ENCOUNTER — Ambulatory Visit (INDEPENDENT_AMBULATORY_CARE_PROVIDER_SITE_OTHER): Payer: Medicare Other | Admitting: Psychiatry

## 2018-04-06 VITALS — BP 114/78 | HR 67 | Ht 63.0 in | Wt 178.0 lb

## 2018-04-06 DIAGNOSIS — F332 Major depressive disorder, recurrent severe without psychotic features: Secondary | ICD-10-CM | POA: Diagnosis not present

## 2018-04-06 DIAGNOSIS — F4310 Post-traumatic stress disorder, unspecified: Secondary | ICD-10-CM

## 2018-04-06 MED ORDER — TRAZODONE HCL 100 MG PO TABS: 200.0000 mg | ORAL_TABLET | Freq: Every evening | ORAL | 2 refills | Status: DC | PRN

## 2018-04-06 MED ORDER — ALPRAZOLAM 1 MG PO TABS: 1.0000 mg | ORAL_TABLET | Freq: Three times a day (TID) | ORAL | 2 refills | Status: DC

## 2018-04-06 MED ORDER — DESVENLAFAXINE SUCCINATE ER 25 MG PO TB24: 25.0000 mg | ORAL_TABLET | Freq: Every day | ORAL | 2 refills | Status: DC

## 2018-04-06 NOTE — Progress Notes (Signed)
[place] MD/PA/NP OP Progress Note  04/06/2018 11:26 AM Kathleen Palmer  MRN:  [id number]  Chief Complaint:  Chief Complaint    Depression; Anxiety; Follow-up     HPI: This patient is a 57 year old married white female who lives with her husband.  She is currently on disability.  She returns for follow-up for treatment of depression anxiety and possible posttraumatic stress disorder.  The patient returns after about 6 weeks.  Since I last saw her she [facility] from 11/13 through 03/19/2018.  This is after she voiced suicidal ideation her therapy session.  While there her Prozac was discontinued in favor of Pristiq 25 mg.  She states this is helping her although it caused quite a bit of money.  She also learned some more coping skills and is feeling better.  Her TSH was elevated and she was started on Synthroid.  Her liver function tests continue to be elevated and she still has not received treatment for hepatitis C.  She saw an internist as a Optometrist there but no new treatments were added.  She is going to see her primary doctor next week.  The patient states that she is sleeping well she is no longer having the crying spells or panic attacks and she is overall feeling better about her sister's recent death. Visit Diagnosis:    ICD-10-CM   1. Major depressive disorder, recurrent episode, severe with anxious distress ([place]) F33.2   2. PTSD (post-traumatic stress disorder) F43.10     Past Psychiatric History: Several previous hospitalizations for depression and suicidal ideation, the last 1 being last month.  Past Medical History:  Past Medical History:  Diagnosis Date  . Angiomyolipoma    Left kidney  . Anxiety   . Arthritis   . Attention to urostomy [facility])   . Chronic pain   . Depression   . Hepatitis C    ? Contracted through [facility]  . Panic 04/29/1999  . PTSD (post-traumatic stress disorder)   . Substance abuse ([place])    Remote history- cocaine,  ETOH, Marijuana  . Thyroid disease     Past Surgical History:  Procedure Laterality Date  . ABDOMINAL HYSTERECTOMY    . ABDOMINAL SURGERY    . BIOPSY  01/27/2017   Procedure: BIOPSY;  Surgeon: Danie Binder, MD;  [facility];  Service: Endoscopy;;  gastric  . CHOLECYSTECTOMY    . COLONOSCOPY WITH PROPOFOL N/A 01/27/2017   Procedure: COLONOSCOPY WITH PROPOFOL;  Surgeon: Danie Binder, MD;  Location: [facility];  Service: Endoscopy;  Laterality: N/A;  12:00pm  . ESOPHAGOGASTRODUODENOSCOPY (EGD) WITH PROPOFOL N/A 01/27/2017   Procedure: ESOPHAGOGASTRODUODENOSCOPY (EGD) WITH PROPOFOL;  Surgeon: Danie Binder, MD;  Location[facility];  Service: Endoscopy;  Laterality: N/A;  . HERNIA REPAIR     x2- abdomen  . ILEO LOOP CONDUIT    . 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 VAGINA RECONSTRUCTION SURGERY      Family Psychiatric History: See below  Family History:  Family History  Adopted: Yes  Problem Relation Age of Onset  . Depression Mother   . Depression Sister   . Anxiety disorder Sister   . Bipolar disorder Sister   . Depression Maternal Grandmother   . ADD / ADHD Neg Hx   . Alcohol abuse Neg Hx   .  Drug abuse Neg Hx   . Dementia Neg Hx   . OCD Neg Hx   . Paranoid behavior Neg Hx   . Schizophrenia Neg Hx   . Seizures Neg Hx   . Sexual abuse Neg Hx   . Physical abuse Neg Hx   . Suicidality Neg Hx   . Colon cancer Neg Hx     Social History:  Social History   Socioeconomic History  . Marital status: Married    Spouse name: Not on file  . Number of children: 0  . Years of education: Not on file  . Highest education level: Not on file  Occupational History  . Occupation: disability  Social Needs  . Financial resource strain: Not on file  . Food insecurity:    Worry: Not on file    Inability: Not on file  . Transportation  needs:    Medical: Not on file    Non-medical: Not on file  Tobacco Use  . Smoking status: Former Smoker    Packs/day: 0.25    Years: 33.00    Pack years: 8.25    Types: Cigarettes    Last attempt to quit: 01/21/2015    Years since quitting: 3.2  . Smokeless tobacco: Never Used  . Tobacco comment: 9-10 cigs a day as of 10/20/2012, (02-07-15 per pt, she stopped smoking 01-21-15)  Substance and Sexual Activity  . Alcohol use: No    Alcohol/week: 0.0 standard drinks  . Drug use: Yes    Types: Marijuana    Comment: cocaine back in the 1980s; Marijuana rarely  . Sexual activity: Yes    Birth control/protection: Surgical  Lifestyle  . Physical activity:    Days per week: Not on file    Minutes per session: Not on file  . Stress: Not on file  Relationships  . Social connections:    Talks on phone: Not on file    Gets together: Not on file    Attends religious service: Not on file    Active member of club or organization: Not on file    Attends meetings of clubs or organizations: Not on file    Relationship status: Not on file  Other Topics Concern  . Not on file  Social History Narrative  . Not on file    Allergies:  Allergies  Allergen Reactions  . Oxycodone Anxiety  . Abilify [Aripiprazole]     Stiff neck  . Bactrim [Sulfamethoxazole-Trimethoprim]     Tongue swelled  . Gabapentin     Dizziness to the point she actually fell  . Ampicillin Rash    Has patient had a PCN reaction causing immediate rash, facial/tongue/throat swelling, SOB or lightheadedness with hypotension: Unknown Has patient had a PCN reaction causing severe rash involving mucus membranes or skin necrosis: Unknown Has patient had a PCN reaction that required hospitalization: No Has patient had a PCN reaction occurring within the last 10 years: No If all of the above answers are "NO", then may proceed with Cephalosporin use.      Metabolic Disorder Labs: Lab Results  Component Value Date   HGBA1C  5.2 03/10/2018   MPG 102.54 03/10/2018   MPG 102.54 12/05/2016   No results found for: PROLACTIN Lab Results  Component Value Date   CHOL 137 03/10/2018   TRIG 78 03/10/2018   HDL 37 (L) 03/10/2018   CHOLHDL 3.7 03/10/2018   VLDL 16 03/10/2018   LDLCALC 84 03/10/2018   LDLCALC 70 12/05/2016  Lab Results  Component Value Date   TSH 14.938 (H) 03/10/2018   TSH 4.65 (H) 06/09/2017    Therapeutic Level Labs: No results found for: LITHIUM No results found for: VALPROATE No components found for:  CBMZ  Current Medications: Current Outpatient Medications  Medication Sig Dispense Refill  . ALPRAZolam (XANAX) 1 MG tablet Take 1 tablet (1 mg total) by mouth 3 (three) times daily. 90 tablet 2  . Desvenlafaxine Succinate ER 25 MG TB24 Take 25 mg by mouth daily. For mood stability 30 tablet 2  . levothyroxine (SYNTHROID, LEVOTHROID) 25 MCG tablet Take 1 tablet (25 mcg total) by mouth daily at 6 (six) AM. For hypothyroidism 30 tablet 0  . traZODone (DESYREL) 100 MG tablet Take 2 tablets (200 mg total) by mouth at bedtime as needed for sleep. 60 tablet 2   No current facility-administered medications for this visit.      Musculoskeletal: Strength & Muscle Tone: within normal limits Gait & Station: normal Patient leans: N/A  Psychiatric Specialty Exam: Review of Systems  Musculoskeletal: Positive for back pain.  All other systems reviewed and are negative.   Blood pressure 114/78, pulse 67, height 5\' 3"  (1.6 m), weight 178 lb (80.7 kg), SpO2 93 %.Body mass index is 31.53 kg/m.  General Appearance: Casual and Fairly Groomed  Eye Contact:  Good  Speech:  Clear and Coherent  Volume:  Normal  Mood:  Anxious  Affect:  Appropriate and Congruent  Thought Process:  Goal Directed  Orientation:  Full (Time, Place, and Person)  Thought Content: Rumination   Suicidal Thoughts:  No  Homicidal Thoughts:  No  Memory:  Immediate;   Good Remote;   Fair  Judgement:  Poor  Insight:   Shallow  Psychomotor Activity:  Decreased  Concentration:  Concentration: Fair and Attention Span: Fair  Recall:  [employer] of Knowledge: Good  Language: Good  Akathisia:  No  Handed:  Right  AIMS (if indicated): not done  Assets:  Communication Skills Desire for Improvement Resilience Social Support Talents/Skills  ADL's:  Intact  Cognition: WNL  Sleep:  Good   Screenings: AIMS     Admission (Discharged) from OP Visit from 03/10/2018 in [place] [facility] Admission (Discharged) from 12/03/2016 in [place] [facility]  AIMS Total Score  0  0    AUDIT     Admission (Discharged) from OP Visit from 03/10/2018 in [place] [facility] Admission (Discharged) from 12/03/2016 in [place] [facility]  Alcohol Use Disorder Identification Test Final Score (AUDIT)  0  0    MDI     Office Visit from 11/15/2015 in [facility]  Total Score (max 50)  24    PHQ2-9     Office Visit from 10/27/2017 in [place] Office Visit from 01/19/2017 in [place] Office Visit from 12/10/2016 in [place] Office Visit from 10/17/2016 in [place] Office Visit from 03/07/2014 in [place]  PHQ-2 Total Score  0  4  4  2  4   PHQ-9 Total Score  -  17  15  8  14        Assessment and Plan: This patient is a 57 year old female with chronic dysphoria possible borderline personality disorder and posttraumatic stress disorder.  She has poor coping skills and seems to have gotten bolstered up again by readmission to the hospital.  At present she is doing better on her current regimen so she will continue Pristiq 25 mg daily for depression Xanax 1 mg 3 times daily for anxiety and trazodone 200 mg at bedtime for sleep.  Her Synthroid will be managed through her primary doctor as well as her  liver issues through gastroenterology.  She will return to see me in 4 weeks   Levonne Spiller, MD 04/06/2018, 11:26 AM

## 2018-04-07 ENCOUNTER — Ambulatory Visit (HOSPITAL_COMMUNITY): Payer: Self-pay | Admitting: Licensed Clinical Social Worker

## 2018-04-09 ENCOUNTER — Telehealth (HOSPITAL_COMMUNITY): Payer: Self-pay | Admitting: *Deleted

## 2018-04-09 NOTE — Telephone Encounter (Signed)
[employer] Medicare Part D Benefit APPROVED  Desvenlafaxine succinate er 25 mg tablet er 24 hr  Approval Timeframe: start date  04/07/2018     End date  Until Further Notice

## 2018-04-12 ENCOUNTER — Ambulatory Visit: Payer: Medicare Other | Admitting: Gastroenterology

## 2018-04-12 ENCOUNTER — Ambulatory Visit: Payer: Medicare Other | Admitting: Family Medicine

## 2018-04-22 ENCOUNTER — Other Ambulatory Visit: Payer: Self-pay | Admitting: Family Medicine

## 2018-04-26 ENCOUNTER — Ambulatory Visit: Payer: Medicare Other | Admitting: Family Medicine

## 2018-04-27 ENCOUNTER — Encounter (HOSPITAL_COMMUNITY): Payer: Self-pay | Admitting: Licensed Clinical Social Worker

## 2018-04-27 ENCOUNTER — Ambulatory Visit (INDEPENDENT_AMBULATORY_CARE_PROVIDER_SITE_OTHER): Payer: Medicare Other | Admitting: Licensed Clinical Social Worker

## 2018-04-27 DIAGNOSIS — F332 Major depressive disorder, recurrent severe without psychotic features: Secondary | ICD-10-CM

## 2018-04-27 DIAGNOSIS — F4310 Post-traumatic stress disorder, unspecified: Secondary | ICD-10-CM | POA: Diagnosis not present

## 2018-04-27 NOTE — Progress Notes (Signed)
   THERAPIST PROGRESS NOTE  Session Time: 11:00 am -11:40 am  Participation Level: Active  Behavioral Response: CasualAlertEuthymic  Type of Therapy: Individual Therapy  Treatment Goals addressed: Coping  Interventions: CBT and Solution Focused  Summary: Kathleen Palmer is a 57 y.o. female who presents oriented x4 (person, place, situation, and object), distracted, casually groomed, causually dressed and cooperative to address depression. Patient has a history of medical issues and mental health treatment. She denies symptoms of mania. Patient denies suicidal and homicidal ideations. She denies psychosis including auditory and visual hallucinations. Patient admits to cannabis use. She is at low risk for lethality. Patient has been hospitalized 7 times due to suicidal ideations but states she has never attempted due to her religious faith.   Physical: Patient has a hernia that causes her a lot of pain. She stays in bed most of the time.  Spiritual/value: Patient has a strong faith. She bought a cross recently and uses that as a reminder of her faith.  Relationships: [name] niece asked if she could stay with patient. Patient turned her down due to niece's substance abuse. She felt a lot of guilt over it due to it being family and the daughter of her sister that passed this year.  Emotional/Mental/Behavior: [name] mood has been depressed. She feels overwhelmed. She has thoughts of suicide but no plan or means. Patient has difficulty with the month of December. She has reminders of trauma, it was her first Christmas without her sister and her niece's drug use impacted her. After discussion, patient understood that this feeling is temporary. She also plans to start going back to [employer] meetings to maintain sobriety and socialize with others.   Patient engaged in session. She responded well to interventions. Patient continues to meet criteria for Major Depressive disorder, recurrent episode, severe  with anxious distress. Patient will continue in outpatient therapy due to being the least restrictive service to meet her needs. Patient made moderate progress on her goals at this time.   Suicidal/Homicidal: Negativewithout intent/plan  Therapist Response: Therapist reviewed patient's recent thoughts and behaviors. Therapist utilized CBT to address depression. Therapist reviewed patient's goals.  Therapist processed patient's feelings to identify triggers for mood. Therapist discussed patient's mood and how she is managing during difficult times.   Plan: Return again in 4 weeks.        Diagnosis: Axis I: Major depressive disorder, recurrent episode, severe with anxious distress    Axis II: No diagnosis    [name], LCSW 04/27/2018

## 2018-05-05 ENCOUNTER — Ambulatory Visit: Payer: Medicare Other | Admitting: Family Medicine

## 2018-05-10 ENCOUNTER — Ambulatory Visit (INDEPENDENT_AMBULATORY_CARE_PROVIDER_SITE_OTHER): Payer: Medicare Other | Admitting: Licensed Clinical Social Worker

## 2018-05-10 ENCOUNTER — Encounter (HOSPITAL_COMMUNITY): Payer: Self-pay | Admitting: Licensed Clinical Social Worker

## 2018-05-10 DIAGNOSIS — F332 Major depressive disorder, recurrent severe without psychotic features: Secondary | ICD-10-CM

## 2018-05-10 NOTE — Progress Notes (Signed)
   THERAPIST PROGRESS NOTE  Session Time: 11:00 am -11:40 am  Participation Level: Active  Behavioral Response: CasualAlertEuthymic  Type of Therapy: Individual Therapy  Treatment Goals addressed: Coping  Interventions: CBT and Solution Focused  Summary: Kathleen Palmer is a 58 y.o. female who presents oriented x4 (person, place, situation, and object), distracted, casually groomed, causually dressed and cooperative to address depression. Patient has a history of medical issues and mental health treatment. She denies symptoms of mania. Patient denies suicidal and homicidal ideations. She denies psychosis including auditory and visual hallucinations. Patient admits to cannabis use. She is at low risk for lethality. Patient has been hospitalized 7 times due to suicidal ideations but states she has never attempted due to her religious faith.   Physical: Patient has a hernia that causes her a lot of pain. Patient is going to get her hernia looked at. She has been worried about having surgery and has been putting it off since 2005. She has had two previous surgeries that didn't correct the hernia and she is worried about another surgery.  Spiritual/value: Patient continues to have a strong faith.  Relationships: Patient set boundaries with her niece and told her that she could no longer give her money or let her stay with her.  Emotional/Mental/Behavior: [name] worries about her physical health has impacted her mood. She has been depressed and anxious but has made the decision to stop putting off her health and get her physical health under control.   Patient engaged in session. She responded well to interventions. Patient continues to meet criteria for Major Depressive disorder, recurrent episode, severe with anxious distress. Patient will continue in outpatient therapy due to being the least restrictive service to meet her needs. Patient made moderate progress on her goals at this time.    Suicidal/Homicidal: Negativewithout intent/plan  Therapist Response: Therapist reviewed patient's recent thoughts and behaviors. Therapist utilized CBT to address depression. Therapist reviewed patient's goals.  Therapist processed patient's feelings to identify triggers for mood. Therapist discussed patient's physical health and reducing her physical which impacts her mood.   Plan: Return again in 4 weeks.        Diagnosis: Axis I: Major depressive disorder, recurrent episode, severe with anxious distress    Axis II: No diagnosis    [name], LCSW 05/10/2018

## 2018-05-12 ENCOUNTER — Ambulatory Visit (INDEPENDENT_AMBULATORY_CARE_PROVIDER_SITE_OTHER): Payer: Medicare Other | Admitting: Psychiatry

## 2018-05-12 ENCOUNTER — Encounter (HOSPITAL_COMMUNITY): Payer: Self-pay | Admitting: Psychiatry

## 2018-05-12 VITALS — BP 119/85 | HR 71 | Ht 63.0 in | Wt 173.0 lb

## 2018-05-12 DIAGNOSIS — F332 Major depressive disorder, recurrent severe without psychotic features: Secondary | ICD-10-CM

## 2018-05-12 MED ORDER — LORAZEPAM 1 MG PO TABS: 1.0000 mg | ORAL_TABLET | Freq: Three times a day (TID) | ORAL | 2 refills | Status: DC

## 2018-05-12 MED ORDER — TRAZODONE HCL 100 MG PO TABS: 200.0000 mg | ORAL_TABLET | Freq: Every evening | ORAL | 2 refills | Status: DC | PRN

## 2018-05-12 MED ORDER — FLUOXETINE HCL 20 MG PO CAPS: ORAL_CAPSULE | ORAL | 2 refills | Status: DC

## 2018-05-12 NOTE — Progress Notes (Signed)
[facility] MD/PA/NP OP Progress Note  05/12/2018 11:36 AM Kathleen Palmer  MRN:  [id number]  Chief Complaint:  Chief Complaint    Depression; Anxiety; Follow-up     HPI: This patient is a 58 year old married white female who lives with her husband.  She is currently on disability.  She returns for follow-up for treatment of depression anxiety and possible posttraumatic stress disorder.  The patient returns after 6 weeks.  She states that she has not been doing that well recently.  She has been low sad crying a lot and feeling hopeless.  However she is not suicidal.  She does not think the Pristiq is working for her and asked to go back on Prozac.  She always seems to do this after medication is changed.  She states that the reason she was hospitalized in 04/23/2023 is because of the death of her sister.  While there they changed Prozac to Pristiq but she thinks overall she did better on Prozac.  She also does not think the Xanax is helping her anxiety is much as it used to and would like to go on Ativan instead.  I think these are reasonable changes.  She continues to participate in therapy.  She is sleeping fairly well with the trazodone.  She is having a lot of pain from abdominal hernia but she is expecting to have surgery for it this year Visit Diagnosis:    ICD-10-CM   1. Major depressive disorder, recurrent episode, severe with anxious distress ([place]) F33.2     Past Psychiatric History: No previous hospitalizations for depression and suicidal ideation, the last 1 being last month  Past Medical History:  Past Medical History:  Diagnosis Date  . Angiomyolipoma    Left kidney  . Anxiety   . Arthritis   . Attention to urostomy [facility])   . Chronic pain   . Depression   . Hepatitis C    ? Contracted through IVDA  . Panic 04/29/1999  . PTSD (post-traumatic stress disorder)   . Substance abuse ([place])    Remote history- cocaine, ETOH, Marijuana  . Thyroid disease     Past Surgical History:   Procedure Laterality Date  . ABDOMINAL HYSTERECTOMY    . ABDOMINAL SURGERY    . BIOPSY  01/27/2017   Procedure: BIOPSY;  Surgeon: Danie Binder, MD;  [facility];  Service: Endoscopy;;  gastric  . CHOLECYSTECTOMY    . COLONOSCOPY WITH PROPOFOL N/A 01/27/2017   Procedure: COLONOSCOPY WITH PROPOFOL;  Surgeon: Danie Binder, MD;  [facility];  Service: Endoscopy;  Laterality: N/A;  12:00pm  . ESOPHAGOGASTRODUODENOSCOPY (EGD) WITH PROPOFOL N/A 01/27/2017   Procedure: ESOPHAGOGASTRODUODENOSCOPY (EGD) WITH PROPOFOL;  Surgeon: Danie Binder, MD;  [facility];  Service: Endoscopy;  Laterality: N/A;  . HERNIA REPAIR     x2- abdomen  . ILEO LOOP CONDUIT    . 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 VAGINA RECONSTRUCTION SURGERY      Family Psychiatric History: See below  Family History:  Family History  Adopted: Yes  Problem Relation Age of Onset  . Depression Mother   . Depression Sister   . Anxiety disorder Sister   . Bipolar disorder Sister   . Depression Maternal Grandmother   . ADD / ADHD Neg Hx   . Alcohol abuse Neg Hx   .  Drug abuse Neg Hx   . Dementia Neg Hx   . OCD Neg Hx   . Paranoid behavior Neg Hx   . Schizophrenia Neg Hx   . Seizures Neg Hx   . Sexual abuse Neg Hx   . Physical abuse Neg Hx   . Suicidality Neg Hx   . Colon cancer Neg Hx     Social History:  Social History   Socioeconomic History  . Marital status: Married    Spouse name: Not on file  . Number of children: 0  . Years of education: Not on file  . Highest education level: Not on file  Occupational History  . Occupation: disability  Social Needs  . Financial resource strain: Not on file  . Food insecurity:    Worry: Not on file    Inability: Not on file  . Transportation needs:    Medical: Not on file    Non-medical: Not on file   Tobacco Use  . Smoking status: Former Smoker    Packs/day: 0.25    Years: 33.00    Pack years: 8.25    Types: Cigarettes    Last attempt to quit: 01/21/2015    Years since quitting: 3.3  . Smokeless tobacco: Never Used  . Tobacco comment: 9-10 cigs a day as of 10/20/2012, (02-07-15 per pt, she stopped smoking 01-21-15)  Substance and Sexual Activity  . Alcohol use: No    Alcohol/week: 0.0 standard drinks  . Drug use: Yes    Types: Marijuana    Comment: cocaine back in the 1980s; Marijuana rarely  . Sexual activity: Yes    Birth control/protection: Surgical  Lifestyle  . Physical activity:    Days per week: Not on file    Minutes per session: Not on file  . Stress: Not on file  Relationships  . Social connections:    Talks on phone: Not on file    Gets together: Not on file    Attends religious service: Not on file    Active member of club or organization: Not on file    Attends meetings of clubs or organizations: Not on file    Relationship status: Not on file  Other Topics Concern  . Not on file  Social History Narrative  . Not on file    Allergies:  Allergies  Allergen Reactions  . Oxycodone Anxiety  . Abilify [Aripiprazole]     Stiff neck  . Bactrim [Sulfamethoxazole-Trimethoprim]     Tongue swelled  . Gabapentin     Dizziness to the point she actually fell  . Ampicillin Rash    Has patient had a PCN reaction causing immediate rash, facial/tongue/throat swelling, SOB or lightheadedness with hypotension: Unknown Has patient had a PCN reaction causing severe rash involving mucus membranes or skin necrosis: Unknown Has patient had a PCN reaction that required hospitalization: No Has patient had a PCN reaction occurring within the last 10 years: No If all of the above answers are "NO", then may proceed with Cephalosporin use.      Metabolic Disorder Labs: Lab Results  Component Value Date   HGBA1C 5.2 03/10/2018   MPG 102.54 03/10/2018   MPG 102.54  12/05/2016   No results found for: PROLACTIN Lab Results  Component Value Date   CHOL 137 03/10/2018   TRIG 78 03/10/2018   HDL 37 (L) 03/10/2018   CHOLHDL 3.7 03/10/2018   VLDL 16 03/10/2018   LDLCALC 84 03/10/2018   LDLCALC 70 12/05/2016  Lab Results  Component Value Date   TSH 14.938 (H) 03/10/2018   TSH 4.65 (H) 06/09/2017    Therapeutic Level Labs: No results found for: LITHIUM No results found for: VALPROATE No components found for:  CBMZ  Current Medications: Current Outpatient Medications  Medication Sig Dispense Refill  . levothyroxine (SYNTHROID, LEVOTHROID) 25 MCG tablet TAKE 1 TABLET BY MOUTH EVERY DAY AT 6 AM FOR HYOPTHYROIDISM 30 tablet 0  . traZODone (DESYREL) 100 MG tablet Take 2 tablets (200 mg total) by mouth at bedtime as needed for sleep. 60 tablet 2  . FLUoxetine (PROZAC) 20 MG capsule Take one daily for 2 weeks, then increase to one twice a day 60 capsule 2  . LORazepam (ATIVAN) 1 MG tablet Take 1 tablet (1 mg total) by mouth 3 (three) times daily. 90 tablet 2   No current facility-administered medications for this visit.      Musculoskeletal: Strength & Muscle Tone: decreased Gait & Station: unsteady Patient leans: N/A  Psychiatric Specialty Exam: Review of Systems  Gastrointestinal: Positive for abdominal pain.  Musculoskeletal: Positive for back pain.  Psychiatric/Behavioral: Positive for depression. The patient is nervous/anxious.   All other systems reviewed and are negative.   Blood pressure 119/85, pulse 71, height 5\' 3"  (1.6 m), weight 173 lb (78.5 kg), SpO2 94 %.Body mass index is 30.65 kg/m.  General Appearance: Casual and Fairly Groomed  Eye Contact:  Good  Speech:  Clear and Coherent  Volume:  Increased  Mood:  Anxious and Dysphoric  Affect:  Constricted and Depressed  Thought Process:  Goal Directed  Orientation:  Full (Time, Place, and Person)  Thought Content: Rumination   Suicidal Thoughts:  No  Homicidal Thoughts:  No   Memory:  Immediate;   Good Recent;   Good Remote;   Fair  Judgement:  Fair  Insight:  Fair  Psychomotor Activity:  Decreased  Concentration:  Concentration: Fair and Attention Span: Fair  Recall:  Good  Fund of Knowledge: Fair  Language: Good  Akathisia:  No  Handed:  Right  AIMS (if indicated): not done  Assets:  Communication Skills Desire for Improvement Resilience Social Support Talents/Skills  ADL's:  Intact  Cognition: WNL  Sleep:  Good   Screenings: AIMS     Admission (Discharged) from OP Visit from 03/10/2018 in [place] [facility] Admission (Discharged) from 12/03/2016 in [place] [facility]  AIMS Total Score  0  0    AUDIT     Admission (Discharged) from OP Visit from 03/10/2018 in [place] [facility] Admission (Discharged) from 12/03/2016 in [place] [facility]  Alcohol Use Disorder Identification Test Final Score (AUDIT)  0  0    MDI     Office Visit from 11/15/2015 in [facility]  Total Score (max 50)  24    PHQ2-9     Office Visit from 10/27/2017 in [place] Office Visit from 01/19/2017 in [place] Office Visit from 12/10/2016 in [place] Office Visit from 10/17/2016 in [place] Office Visit from 03/07/2014 in [place]  PHQ-2 Total Score  0  4  4  2  4   PHQ-9 Total Score  -  17  15  8  14        Assessment and Plan: Patient is a 58 year old female with long-term history of depression anxiety and possible borderline personality disorder.  She again wants to go back on Prozac so she will restart Prozac at 20 mg daily for 2 weeks and then advance to 20 mg twice daily as she had been on the past.  She will switch Xanax to Ativan 1 mg 3 times daily to better help anxiety.  She will continue trazodone 200 mg at bedtime.  She will  continue her counseling here and return to see me in 4 weeks   Levonne Spiller, MD 05/12/2018, 11:36 AM

## 2018-05-14 ENCOUNTER — Ambulatory Visit: Payer: Medicare Other | Admitting: Family Medicine

## 2018-05-18 ENCOUNTER — Other Ambulatory Visit: Payer: Self-pay | Admitting: Family Medicine

## 2018-05-19 ENCOUNTER — Ambulatory Visit: Payer: Medicare Other | Admitting: Family Medicine

## 2018-05-24 ENCOUNTER — Ambulatory Visit (HOSPITAL_COMMUNITY): Payer: Self-pay | Admitting: Licensed Clinical Social Worker

## 2018-05-31 ENCOUNTER — Ambulatory Visit: Payer: Medicare Other | Admitting: Family Medicine

## 2018-06-01 ENCOUNTER — Ambulatory Visit (INDEPENDENT_AMBULATORY_CARE_PROVIDER_SITE_OTHER): Payer: Medicare Other | Admitting: Psychiatry

## 2018-06-01 ENCOUNTER — Encounter (HOSPITAL_COMMUNITY): Payer: Self-pay | Admitting: Psychiatry

## 2018-06-01 VITALS — BP 126/78 | HR 78 | Ht 63.0 in | Wt 169.0 lb

## 2018-06-01 DIAGNOSIS — F332 Major depressive disorder, recurrent severe without psychotic features: Secondary | ICD-10-CM | POA: Diagnosis not present

## 2018-06-01 MED ORDER — FLUOXETINE HCL 20 MG PO CAPS: 20.0000 mg | ORAL_CAPSULE | Freq: Two times a day (BID) | ORAL | 2 refills | Status: DC

## 2018-06-01 MED ORDER — TRAZODONE HCL 100 MG PO TABS: 200.0000 mg | ORAL_TABLET | Freq: Every evening | ORAL | 2 refills | Status: DC | PRN

## 2018-06-01 MED ORDER — ALPRAZOLAM 1 MG PO TABS: 1.0000 mg | ORAL_TABLET | Freq: Three times a day (TID) | ORAL | 2 refills | Status: DC | PRN

## 2018-06-01 NOTE — Progress Notes (Signed)
[name] OP Progress Note  06/01/2018 10:11 AM Kathleen Palmer  MRN:  [id number]  Chief Complaint:  Chief Complaint    Depression; Anxiety     HPI: This patient is a 58 year old married white female who lives with her husband.  She is currently on disability.  She returns for follow-up for treatment of depression anxiety and possible posttraumatic stress disorder.  The patient returns after 3 weeks.  Last time she has to go back to Prozac because she did not think the Pristiq was working.  She states that she is feeling better on the Prozac 40 mg and is not having it as many crying spells or low periods.  We also changed her Xanax to Ativan at her request.  However she states the Ativan is not helping her anxiety very much and she would like to go back on Xanax.  She seems to do this every time we change medications.  She states she may have some abdominal surgeries coming up but it looks like from the record that she missed her consultation at [facility].  She is going to try to sort this out with Dr. Buelah Manis her primary care physician.  She also needs to be retreated for hepatitis C.  She denies suicidal ideation today. Visit Diagnosis:    ICD-10-CM   1. Major depressive disorder, recurrent episode, severe with anxious distress ([place]) F33.2     Past Psychiatric History: Several previous hospitalizations for depression and suicidal ideation, the last one 2 months ago  Past Medical History:  Past Medical History:  Diagnosis Date  . Angiomyolipoma    Left kidney  . Anxiety   . Arthritis   . Attention to urostomy [facility])   . Chronic pain   . Depression   . Hepatitis C    ? Contracted through IVDA  . Panic 04/29/1999  . PTSD (post-traumatic stress disorder)   . Substance abuse ([place])    Remote history- cocaine, ETOH, Marijuana  . Thyroid disease     Past Surgical History:  Procedure Laterality Date  . ABDOMINAL HYSTERECTOMY    . ABDOMINAL SURGERY    . BIOPSY  01/27/2017   Procedure:  BIOPSY;  Surgeon: Danie Binder, MD;  [facility];  Service: Endoscopy;;  gastric  . CHOLECYSTECTOMY    . COLONOSCOPY WITH PROPOFOL N/A 01/27/2017   Procedure: COLONOSCOPY WITH PROPOFOL;  Surgeon: Danie Binder, MD;  [facility];  Service: Endoscopy;  Laterality: N/A;  12:00pm  . ESOPHAGOGASTRODUODENOSCOPY (EGD) WITH PROPOFOL N/A 01/27/2017   Procedure: ESOPHAGOGASTRODUODENOSCOPY (EGD) WITH PROPOFOL;  Surgeon: Danie Binder, MD;  [facility];  Service: Endoscopy;  Laterality: N/A;  . HERNIA REPAIR     x2- abdomen  . ILEO LOOP CONDUIT    . 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 VAGINA RECONSTRUCTION SURGERY      Family Psychiatric History: See below  Family History:  Family History  Adopted: Yes  Problem Relation Age of Onset  . Depression Mother   . Depression Sister   . Anxiety disorder Sister   . Bipolar disorder Sister   . Depression Maternal Grandmother   . ADD / ADHD Neg Hx   . Alcohol abuse Neg Hx   . Drug abuse Neg Hx   . Dementia Neg Hx   . OCD Neg Hx   .  Paranoid behavior Neg Hx   . Schizophrenia Neg Hx   . Seizures Neg Hx   . Sexual abuse Neg Hx   . Physical abuse Neg Hx   . Suicidality Neg Hx   . Colon cancer Neg Hx     Social History:  Social History   Socioeconomic History  . Marital status: Married    Spouse name: Not on file  . Number of children: 0  . Years of education: Not on file  . Highest education level: Not on file  Occupational History  . Occupation: disability  Social Needs  . Financial resource strain: Not on file  . Food insecurity:    Worry: Not on file    Inability: Not on file  . Transportation needs:    Medical: Not on file    Non-medical: Not on file  Tobacco Use  . Smoking status: Former Smoker    Packs/day: 0.25    Years: 33.00    Pack years: 8.25    Types:  Cigarettes    Last attempt to quit: 01/21/2015    Years since quitting: 3.3  . Smokeless tobacco: Never Used  . Tobacco comment: 9-10 cigs a day as of 10/20/2012, (02-07-15 per pt, she stopped smoking 01-21-15)  Substance and Sexual Activity  . Alcohol use: No    Alcohol/week: 0.0 standard drinks  . Drug use: Yes    Types: Marijuana    Comment: cocaine back in the 1980s; Marijuana rarely  . Sexual activity: Yes    Birth control/protection: Surgical  Lifestyle  . Physical activity:    Days per week: Not on file    Minutes per session: Not on file  . Stress: Not on file  Relationships  . Social connections:    Talks on phone: Not on file    Gets together: Not on file    Attends religious service: Not on file    Active member of club or organization: Not on file    Attends meetings of clubs or organizations: Not on file    Relationship status: Not on file  Other Topics Concern  . Not on file  Social History Narrative  . Not on file    Allergies:  Allergies  Allergen Reactions  . Oxycodone Anxiety  . Abilify [Aripiprazole]     Stiff neck  . Bactrim [Sulfamethoxazole-Trimethoprim]     Tongue swelled  . Gabapentin     Dizziness to the point she actually fell  . Ampicillin Rash    Has patient had a PCN reaction causing immediate rash, facial/tongue/throat swelling, SOB or lightheadedness with hypotension: Unknown Has patient had a PCN reaction causing severe rash involving mucus membranes or skin necrosis: Unknown Has patient had a PCN reaction that required hospitalization: No Has patient had a PCN reaction occurring within the last 10 years: No If all of the above answers are "NO", then may proceed with Cephalosporin use.      Metabolic Disorder Labs: Lab Results  Component Value Date   HGBA1C 5.2 03/10/2018   MPG 102.54 03/10/2018   MPG 102.54 12/05/2016   No results found for: PROLACTIN Lab Results  Component Value Date   CHOL 137 03/10/2018   TRIG 78  03/10/2018   HDL 37 (L) 03/10/2018   CHOLHDL 3.7 03/10/2018   VLDL 16 03/10/2018   LDLCALC 84 03/10/2018   LDLCALC 70 12/05/2016   Lab Results  Component Value Date   TSH 14.938 (H) 03/10/2018   TSH 4.65 (H) 06/09/2017  Therapeutic Level Labs: No results found for: LITHIUM No results found for: VALPROATE No components found for:  CBMZ  Current Medications: Current Outpatient Medications  Medication Sig Dispense Refill  . ALPRAZolam (XANAX) 1 MG tablet Take 1 tablet (1 mg total) by mouth 3 (three) times daily as needed for anxiety. 90 tablet 2  . FLUoxetine (PROZAC) 20 MG capsule Take 1 capsule (20 mg total) by mouth 2 (two) times daily. 60 capsule 2  . levothyroxine (SYNTHROID, LEVOTHROID) 25 MCG tablet TAKE 1 TABLET BY MOUTH EVERY DAY AT 6 AM FOR HYOPTHYROIDISM 30 tablet 0  . traZODone (DESYREL) 100 MG tablet Take 2 tablets (200 mg total) by mouth at bedtime as needed for sleep. 60 tablet 2   No current facility-administered medications for this visit.      Musculoskeletal: Strength & Muscle Tone: decreased Gait & Station: unsteady Patient leans: N/A  Psychiatric Specialty Exam: Review of Systems  Constitutional: Positive for malaise/fatigue.  Gastrointestinal: Positive for abdominal pain.  Musculoskeletal: Positive for back pain and joint pain.  Psychiatric/Behavioral: The patient is nervous/anxious.   All other systems reviewed and are negative.   Blood pressure 126/78, pulse 78, height 5\' 3"  (1.6 m), weight 169 lb (76.7 kg), SpO2 97 %.Body mass index is 29.94 kg/m.  General Appearance: Casual and Fairly Groomed  Eye Contact:  Good  Speech:  Clear and Coherent  Volume:  Decreased  Mood:  Anxious  Affect:  Constricted and Flat  Thought Process:  Goal Directed  Orientation:  Full (Time, Place, and Person)  Thought Content: Rumination   Suicidal Thoughts:  No  Homicidal Thoughts:  No  Memory:  Immediate;   Good Recent;   Good Remote;   Fair  Judgement:   Fair  Insight:  Fair  Psychomotor Activity:  Decreased  Concentration:  Concentration: Fair and Attention Span: Fair  Recall:  Good  Fund of Knowledge: Good  Language: Good  Akathisia:  No  Handed:  Right  AIMS (if indicated): not done  Assets:  Communication Skills Desire for Improvement Resilience Social Support Talents/Skills  ADL's:  Intact  Cognition: WNL  Sleep:  Good   Screenings: AIMS     Admission (Discharged) from OP Visit from 03/10/2018 in [place] [facility] Admission (Discharged) from 12/03/2016 in [place] [facility]  AIMS Total Score  0  0    AUDIT     Admission (Discharged) from OP Visit from 03/10/2018 in [place] [facility] Admission (Discharged) from 12/03/2016 in [place] [facility]  Alcohol Use Disorder Identification Test Final Score (AUDIT)  0  0    MDI     Office Visit from 11/15/2015 in [facility] [facility]  Total Score (max 50)  24    PHQ2-9     Office Visit from 10/27/2017 in [place] Office Visit from 01/19/2017 in [place] Office Visit from 12/10/2016 in [place] Office Visit from 10/17/2016 in [place] Office Visit from 03/07/2014 in [place]  PHQ-2 Total Score  0  4  4  2  4   PHQ-9 Total Score  -  17  15  8  14        Assessment and Plan: This this patient is a 58 year old female with a history of depression anxiety and probable borderline personality traits.  Every time her medication is changed she wants to go back to  Prozac and Xanax and feels that these worked the best.  Therefore she will continue Prozac 20 mg twice daily for depression.  She will discontinue Ativan and return to Xanax 1 mg 3 times daily for anxiety and continue trazodone 200 mg at bedtime for sleep.  She will continue her therapy here and return to  see me in 4 weeks   Levonne Spiller, MD 06/01/2018, 10:11 AM

## 2018-06-07 ENCOUNTER — Ambulatory Visit: Payer: Medicare Other | Admitting: Family Medicine

## 2018-06-14 ENCOUNTER — Ambulatory Visit (INDEPENDENT_AMBULATORY_CARE_PROVIDER_SITE_OTHER): Payer: Medicare Other | Admitting: Licensed Clinical Social Worker

## 2018-06-14 ENCOUNTER — Encounter (HOSPITAL_COMMUNITY): Payer: Self-pay | Admitting: Licensed Clinical Social Worker

## 2018-06-14 DIAGNOSIS — F332 Major depressive disorder, recurrent severe without psychotic features: Secondary | ICD-10-CM

## 2018-06-14 NOTE — Progress Notes (Signed)
   THERAPIST PROGRESS NOTE  Session Time: 11:00 am -11:40 am  Participation Level: Active  Behavioral Response: CasualAlertEuthymic  Type of Therapy: Individual Therapy  Treatment Goals addressed: Coping  Interventions: CBT and Solution Focused  Summary: Kathleen Palmer is a 58 y.o. female who presents oriented x4 (person, place, situation, and object), distracted, casually groomed, causually dressed and cooperative to address depression. Patient has a history of medical issues and mental health treatment. She denies symptoms of mania. Patient denies suicidal and homicidal ideations. She denies psychosis including auditory and visual hallucinations. Patient admits to cannabis use. She is at low risk for lethality. Patient has been hospitalized 7 times due to suicidal ideations but states she has never attempted due to her religious faith.   Physical: Patient has been tired and staying in bed a lot.  Spiritual/value: Patient continues to have a strong faith. She prays daily and expresses gratitude for small things like having soap.  Relationships: Patient is getting along with her husband.   Emotional/Mental/Behavior: Patient has felt stuck. She feels like she needs small steps to take to work on her mood and get unstuck. After discussion, patient identified that she needs to get out of bed daily even if she doesn't have anything to do for the day. Patient agreed to go for a walk 3 times a week for 20 minutes. She agreed to shower and work on her hygiene 3 times a week. Patient also noted that she wants to pay more attention to her husband since he does a good job taking care of him. She wants to spend time with him or even make meals for him.   Patient engaged in session. She responded well to interventions. Patient continues to meet criteria for Major Depressive disorder, recurrent episode, severe with anxious distress. Patient will continue in outpatient therapy due to being the least  restrictive service to meet her needs. Patient made moderate progress on her goals at this time.   Suicidal/Homicidal: Negativewithout intent/plan  Therapist Response: Therapist reviewed patient's recent thoughts and behaviors. Therapist utilized CBT to address depression. Therapist reviewed patient's goals.  Therapist processed patient's feelings to identify triggers for mood. Therapist discussed small steps that patient could take to get unstuck and improve mood.    Plan: Return again in 4 weeks.        Diagnosis: Axis I: Major depressive disorder, recurrent episode, severe with anxious distress    Axis II: No diagnosis    [name], LCSW 06/14/2018

## 2018-06-15 ENCOUNTER — Ambulatory Visit (HOSPITAL_COMMUNITY): Payer: Medicare Other | Admitting: Psychiatry

## 2018-06-27 IMAGING — CT CT ABD-PELV W/ CM
2 of 5 series · 15 of 46 positions shown, 17 images · IV contrast (Isovue)
Comparison: 03/07/2015

ADDENDUM:
Omitted finding of mildly enlarged lymph nodes in the deep liver
drainage, up to 1 cm, usually reactive and incidental in isolation.
Cirrhosis workup has been recommended.
CLINICAL DATA: Constipation.  Last bowel movement 5 days ago.

EXAM:
CT ABDOMEN AND PELVIS WITH CONTRAST
TECHNIQUE: Multidetector CT imaging of the abdomen and pelvis was performed
using the standard protocol following bolus administration of
intravenous contrast.
CONTRAST:  100mL YGKGZL-OYY IOPAMIDOL (YGKGZL-OYY) INJECTION 61%

[Series 2: axial st · axial · 0.67mm/px · z∈[-464,-89]mm · 12 of 87 slices shown, 14 images]
[im 6/87  soft-tissue]
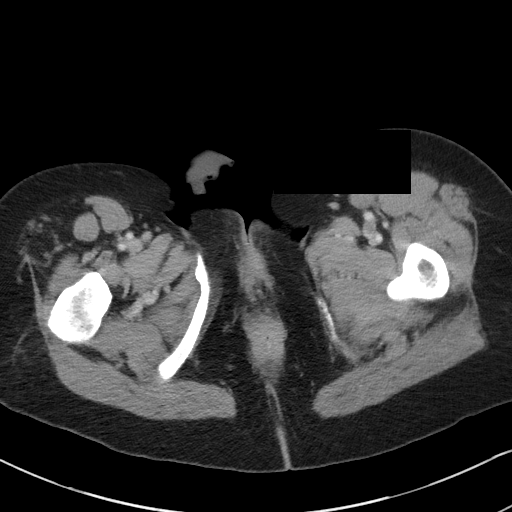
[im 6/87  bone]
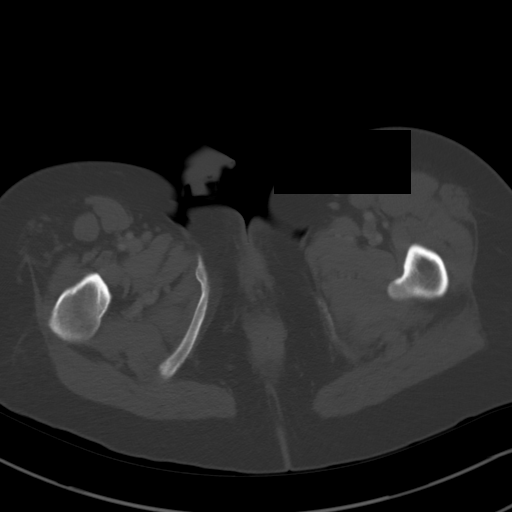
[im 16/87  soft-tissue]
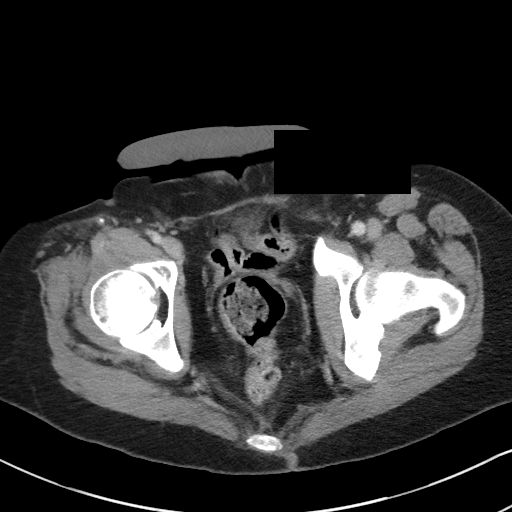
[im 21/87  soft-tissue]
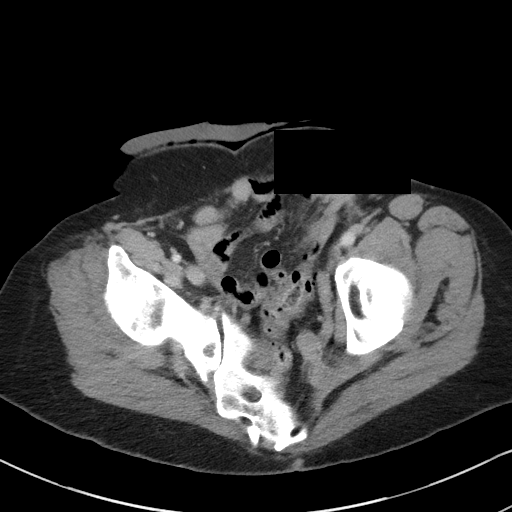
[im 26/87  soft-tissue]
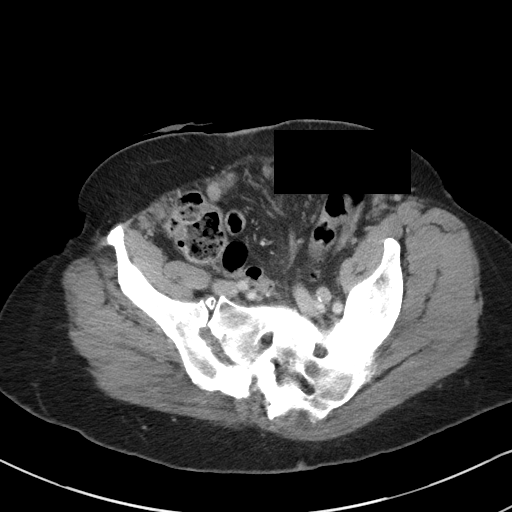
[im 36/87  soft-tissue]
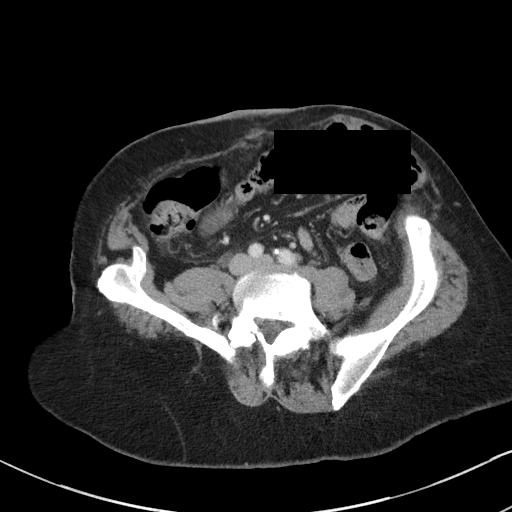
[im 41/87  soft-tissue]
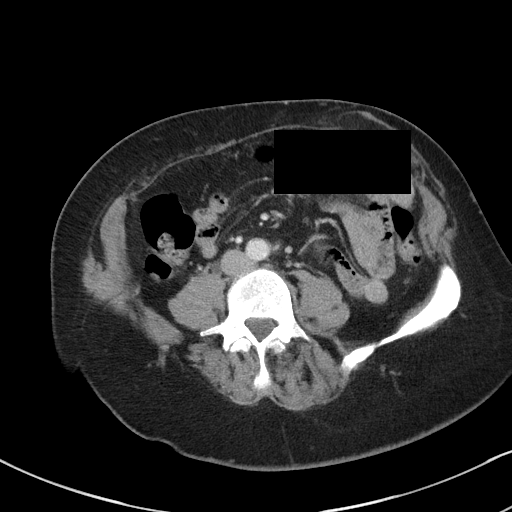
[im 46/87  soft-tissue]
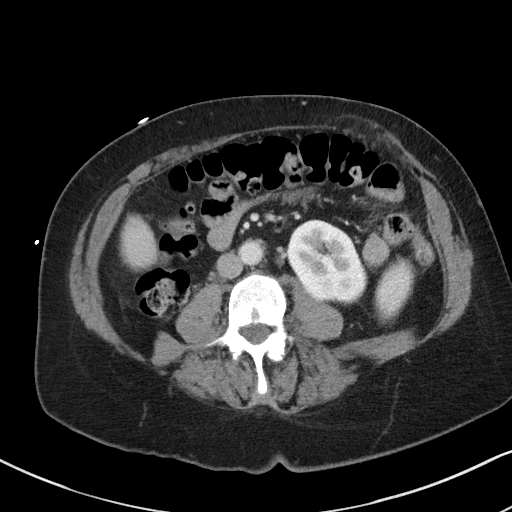
[im 56/87  soft-tissue]
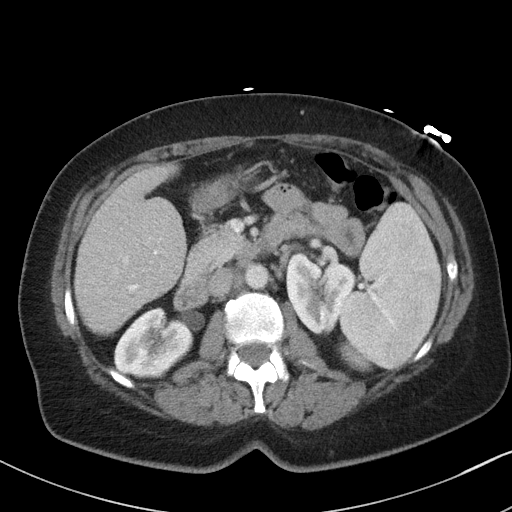
[im 61/87  soft-tissue]
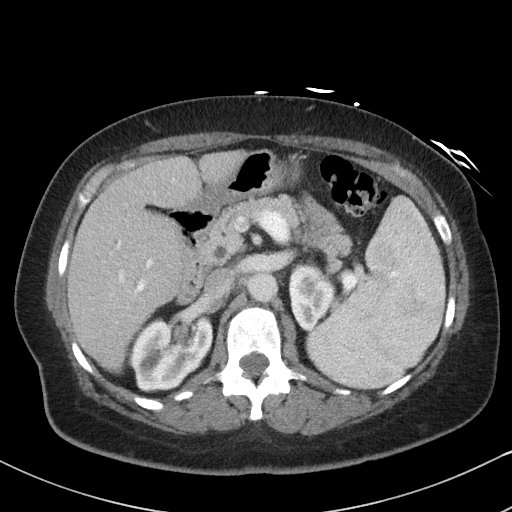
[im 61/87  bone]
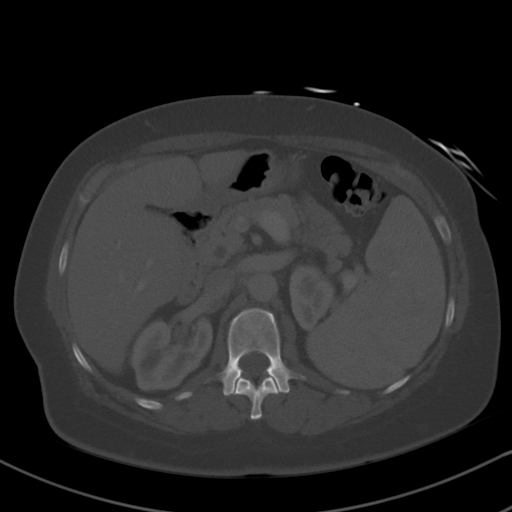
[im 66/87  soft-tissue]
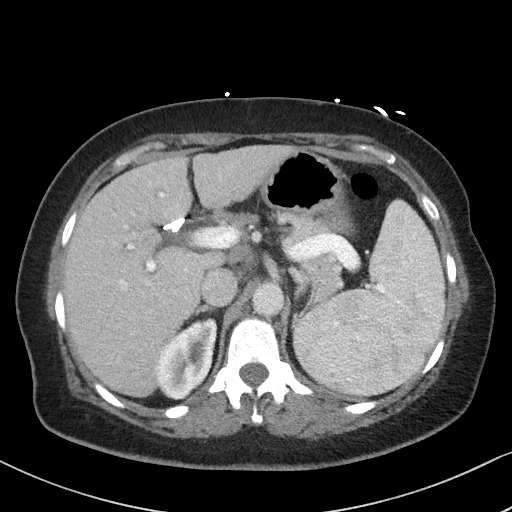
[im 76/87  soft-tissue]
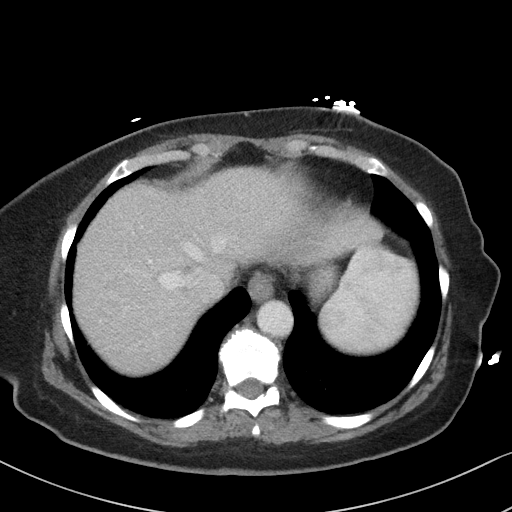
[im 81/87  soft-tissue]
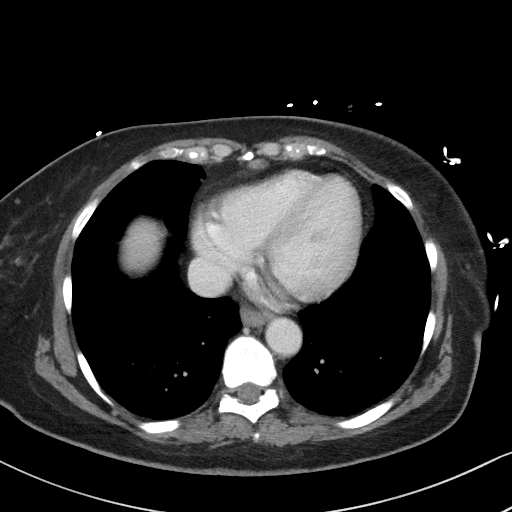

[Series 4: coronal st · coronal · 0.65mm/px · 3 of 81 slices shown]
[im 27/81  soft-tissue]
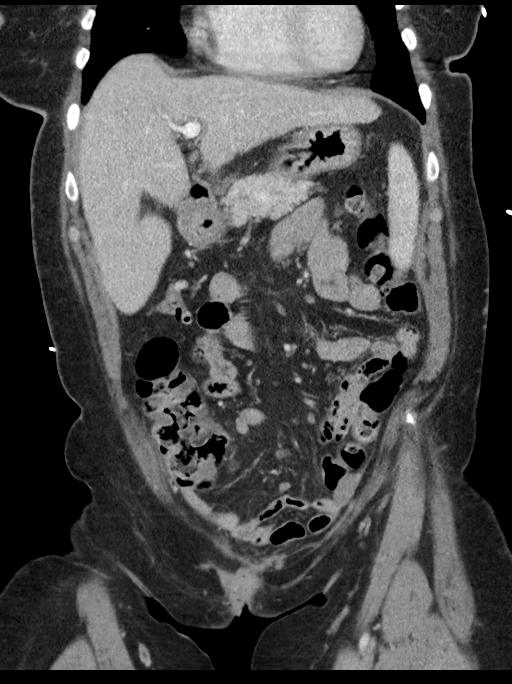
[im 36/81  soft-tissue]
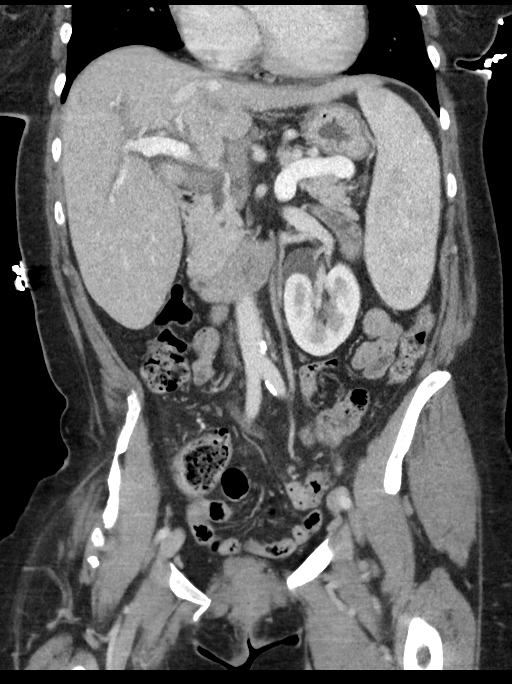
[im 45/81  soft-tissue]
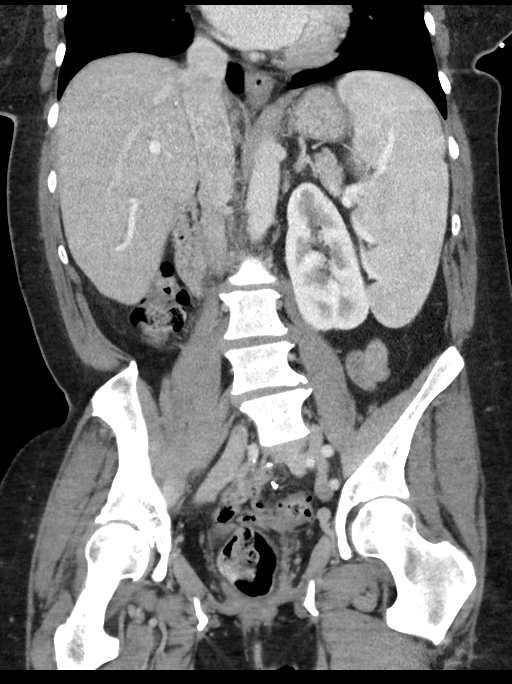

[15 of 46 positions shown; findings below may reference images not displayed]

FINDINGS: Lower chest:  No contributory findings.

Hepatobiliary: No focal liver abnormality.Cholecystectomy with
chronic intrahepatic bile duct prominence. Mild surface lobulation
of the liver.

Pancreas: Unremarkable.

Spleen: No splenomegaly with 17 cm length and nearly 7 cm thickness.
No focal abnormality is seen. No venous compromise.

Adrenals/Urinary Tract: Negative adrenals. There is bilateral renal
cortical scarring, worse on the right. Unremarkable appearance of
the urostomy. Mild hydroureter and pelviectasis. No suspected
obstruction. History of bladder exstrophy. Soft tissue density at
the level of the bladder with channel extending to the skin surface
at the mons pubis, chronic. Patient has history of fistula.

Stomach/Bowel: Mild colonic diverticulosis. No abnormal stool
retention or rectal impaction. No evidence of bowel inflammation.
The appendix is not well seen, potentially resected at ileal conduit
formation. No pericecal inflammation.

Vascular/Lymphatic: No acute vascular abnormality. Mild
atherosclerotic calcification of the aorta. Stable tortuosity and
intermittent narrowing of the left common femoral vein. No mass or
adenopathy.

Reproductive:Hysterectomy.

Other: No ascites or pneumoperitoneum. Lax abdominal wall with mesh
repair of a left-sided hernia. Mild herniation of fat at the
superior margin of the mesh that has progressed.

Musculoskeletal: Dysmorphic pelvis with symphysis pubis diastasis.
The left sacroiliac joint is fused. There is advanced degeneration
of the right sacroiliac joint. Asymmetric right hip osteoarthritis.
IMPRESSION: 1. No acute finding. No abnormal stool retention to correlate with
constipation history.
2. New splenomegaly, nonspecific. Patient has history of hepatitis-C
and there is mild liver surface lobulation, suggest cirrhosis
workup.
3. History of bladder exstrophy with stable pelvic wound. Urostomy
and chronic renal scarring greater on the right.
4. Left abdominal wall hernia with mesh repair. Interval recurrent
fatty herniation along the superior mesh.
5. Colonic diverticulosis

## 2018-06-30 ENCOUNTER — Other Ambulatory Visit: Payer: Self-pay

## 2018-06-30 ENCOUNTER — Emergency Department (HOSPITAL_COMMUNITY)
Admission: EM | Admit: 2018-06-30 | Discharge: 2018-06-30 | Disposition: A | Discharge status: Home / Self Care | Payer: Medicare Other | Attending: Emergency Medicine | Admitting: Emergency Medicine

## 2018-06-30 ENCOUNTER — Encounter (HOSPITAL_COMMUNITY): Payer: Self-pay

## 2018-06-30 ENCOUNTER — Ambulatory Visit (HOSPITAL_COMMUNITY): Payer: Medicare Other | Admitting: Psychiatry

## 2018-06-30 ENCOUNTER — Emergency Department (HOSPITAL_COMMUNITY): Payer: Medicare Other

## 2018-06-30 DIAGNOSIS — N132 Hydronephrosis with renal and ureteral calculous obstruction: Secondary | ICD-10-CM | POA: Diagnosis not present

## 2018-06-30 DIAGNOSIS — R1012 Left upper quadrant pain: Secondary | ICD-10-CM | POA: Diagnosis not present

## 2018-06-30 DIAGNOSIS — R109 Unspecified abdominal pain: Secondary | ICD-10-CM | POA: Diagnosis not present

## 2018-06-30 DIAGNOSIS — Z87891 Personal history of nicotine dependence: Secondary | ICD-10-CM | POA: Insufficient documentation

## 2018-06-30 LAB — URINALYSIS, ROUTINE W REFLEX MICROSCOPIC
BILIRUBIN URINE: NEGATIVE
Glucose, UA: NEGATIVE mg/dL
Hgb urine dipstick: NEGATIVE
Ketones, ur: NEGATIVE mg/dL
Leukocytes,Ua: NEGATIVE
Nitrite: POSITIVE — AB
PROTEIN: NEGATIVE mg/dL
SPECIFIC GRAVITY, URINE: 1.008 (ref 1.005–1.030)
pH: 7 (ref 5.0–8.0)

## 2018-06-30 LAB — BASIC METABOLIC PANEL
ANION GAP: 6 (ref 5–15)
BUN: 12 mg/dL (ref 6–20)
CALCIUM: 8.8 mg/dL — AB (ref 8.9–10.3)
CO2: 25 mmol/L (ref 22–32)
Chloride: 105 mmol/L (ref 98–111)
Creatinine, Ser: 0.71 mg/dL (ref 0.44–1.00)
GFR calc non Af Amer: >60 mL/min (ref >60)
Glucose, Bld: 81 mg/dL (ref 70–99)
Potassium: 3.4 mmol/L — ABNORMAL LOW (ref 3.5–5.1)
SODIUM: 136 mmol/L (ref 135–145)

## 2018-06-30 LAB — CBC
HEMATOCRIT: 45.3 % (ref 36.0–46.0)
HEMOGLOBIN: 14.7 g/dL (ref 12.0–15.0)
MCH: 29.6 pg (ref 26.0–34.0)
MCHC: 32.5 g/dL (ref 30.0–36.0)
MCV: 91.1 fL (ref 80.0–100.0)
NRBC: 0 % (ref 0.0–0.2)
Platelets: 63 10*3/uL — ABNORMAL LOW (ref 150–400)
RBC: 4.97 MIL/uL (ref 3.87–5.11)
RDW: 13.7 % (ref 11.5–15.5)
WBC: 4.7 10*3/uL (ref 4.0–10.5)

## 2018-06-30 NOTE — Discharge Instructions (Signed)
You were evaluated in the Emergency Department and after careful evaluation, we did not find any emergent condition requiring admission or further testing in the hospital.  Your labs and CT are reassuring today.  We sent your urine sample to the lab for a closer look, we will call you with any abnormal results.  Please follow up with the urologist provided.  Please return to the Emergency Department if you experience any worsening of your condition.  We encourage you to follow up with a primary care provider.  Thank you for allowing [place] to be a part of your care.

## 2018-06-30 NOTE — ED Provider Notes (Signed)
[facility] Emergency Department Provider Note MRN:  [id number]  Arrival date & time: 06/30/18     Chief Complaint   Back Pain   History of Present Illness   Kathleen Palmer is a 58 y.o. year-old female with a history of urostomy, substance abuse presenting to the ED with chief complaint of back pain.  The pain is located in the left flank, present for 1 month, began suddenly, intermittent, associated with decreased output from her urostomy for the past several days.  Denies fever, no chest pain or shortness of breath, pain to her left upper quadrant which she describes as chronic, present for several months.  Pain to the left flank described as a burning pain, worse with standing up or sitting down.  Review of Systems  A complete 10 system review of systems was obtained and all systems are negative except as noted in the HPI and PMH.   [name] Health History    Past Medical History:  Diagnosis Date  . Angiomyolipoma    Left kidney  . Anxiety   . Arthritis   . Attention to urostomy [facility])   . Chronic pain   . Depression   . Hepatitis C    ? Contracted through IVDA  . Panic 04/29/1999  . PTSD (post-traumatic stress disorder)   . Substance abuse ([place])    Remote history- cocaine, ETOH, Marijuana  . Thyroid disease     Past Surgical History:  Procedure Laterality Date  . ABDOMINAL HYSTERECTOMY    . ABDOMINAL SURGERY    . BIOPSY  01/27/2017   Procedure: BIOPSY;  Surgeon: Danie Binder, MD;  Location: [facility];  Service: Endoscopy;;  gastric  . CHOLECYSTECTOMY    . COLONOSCOPY WITH PROPOFOL N/A 01/27/2017   Procedure: COLONOSCOPY WITH PROPOFOL;  Surgeon: Danie Binder, MD;  Location: [facility];  Service: Endoscopy;  Laterality: N/A;  12:00pm  . ESOPHAGOGASTRODUODENOSCOPY (EGD) WITH PROPOFOL N/A 01/27/2017   Procedure: ESOPHAGOGASTRODUODENOSCOPY (EGD) WITH PROPOFOL;  Surgeon: Danie Binder, MD;  Location: [facility];  Service: Endoscopy;   Laterality: N/A;  . HERNIA REPAIR     x2- abdomen  . ILEO LOOP CONDUIT    . 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 VAGINA RECONSTRUCTION SURGERY      Family History  Adopted: Yes  Problem Relation Age of Onset  . Depression Mother   . Depression Sister   . Anxiety disorder Sister   . Bipolar disorder Sister   . Depression Maternal Grandmother   . ADD / ADHD Neg Hx   . Alcohol abuse Neg Hx   . Drug abuse Neg Hx   . Dementia Neg Hx   . OCD Neg Hx   . Paranoid behavior Neg Hx   . Schizophrenia Neg Hx   . Seizures Neg Hx   . Sexual abuse Neg Hx   . Physical abuse Neg Hx   . Suicidality Neg Hx   . Colon cancer Neg Hx     Social History   Socioeconomic History  . Marital status: Married    Spouse name: Not on file  . Number of children: 0  . Years of education: Not on file  . Highest education level: Not on file  Occupational History  . Occupation: disability  Social Needs  . Financial resource strain: Not on file  .  Food insecurity:    Worry: Not on file    Inability: Not on file  . Transportation needs:    Medical: Not on file    Non-medical: Not on file  Tobacco Use  . Smoking status: Former Smoker    Packs/day: 0.25    Years: 33.00    Pack years: 8.25    Types: Cigarettes    Last attempt to quit: 01/21/2015    Years since quitting: 3.4  . Smokeless tobacco: Never Used  . Tobacco comment: 9-10 cigs a day as of 10/20/2012, (02-07-15 per pt, she stopped smoking 01-21-15)  Substance and Sexual Activity  . Alcohol use: No    Alcohol/week: 0.0 standard drinks  . Drug use: Not Currently    Types: Marijuana    Comment: quit 10 days   . Sexual activity: Yes    Birth control/protection: Surgical  Lifestyle  . Physical activity:    Days per week: Not on file    Minutes per session: Not on file  . Stress: Not on file  Relationships  . Social  connections:    Talks on phone: Not on file    Gets together: Not on file    Attends religious service: Not on file    Active member of club or organization: Not on file    Attends meetings of clubs or organizations: Not on file    Relationship status: Not on file  . Intimate partner violence:    Fear of current or ex partner: Not on file    Emotionally abused: Not on file    Physically abused: Not on file    Forced sexual activity: Not on file  Other Topics Concern  . Not on file  Social History Narrative  . Not on file     Physical Exam  Vital Signs and Nursing Notes reviewed Vitals:   06/30/18 1242  BP: 129/76  Pulse: 71  Resp: 18  Temp: 98.1 F (36.7 C)  SpO2: 97%    CONSTITUTIONAL: Well-appearing, NAD NEURO:  Alert and oriented x 3, no focal deficits EYES:  eyes equal and reactive ENT/NECK:  no LAD, no JVD CARDIO: Regular rate, well-perfused, normal S1 and S2 PULM:  CTAB no wheezing or rhonchi GI/GU:  normal bowel sounds, non-distended, non-tender; urostomy in place periumbilically MSK/SPINE:  No gross deformities, no edema SKIN:  no rash, atraumatic PSYCH:  Appropriate speech and behavior  Diagnostic and Interventional Summary    Labs Reviewed  URINALYSIS, ROUTINE W REFLEX MICROSCOPIC - Abnormal; Notable for the following components:      Result Value   APPearance HAZY (*)    Nitrite POSITIVE (*)    Bacteria, UA RARE (*)    All other components within normal limits  CBC - Abnormal; Notable for the following components:   Platelets 63 (*)    All other components within normal limits  BASIC METABOLIC PANEL - Abnormal; Notable for the following components:   Potassium 3.4 (*)    Calcium 8.8 (*)    All other components within normal limits  URINE CULTURE    CT Renal Stone Study  Final Result      Medications - No data to display   Procedures Critical Care  ED Course and Medical Decision Making  I have reviewed the triage vital signs and the  nursing notes.  Pertinent labs & imaging results that were available during my care of the patient were reviewed by me and considered in my medical decision making (see  below for details).  Will eval for AKI, UTI, kidney stone, complication related to prior surgeries.  Labs and CT pending.  CT with no acute process, labs unremarkable, urinalysis with weak evidence to suggest infection, will send for culture for further clarification.  Patient will follow-up with [facility] urologist.  After the discussed management above, the patient was determined to be safe for discharge.  The patient was in agreement with this plan and all questions regarding their care were answered.  ED return precautions were discussed and the patient will return to the ED with any significant worsening of condition.  Barth Kirks. Sedonia Small, [place] [email or web address]  Final Clinical Impressions(s) / ED Diagnoses     ICD-10-CM   1. Flank pain R10.9     ED Discharge Orders    None         Maudie Flakes, MD 06/30/18 [phone]

## 2018-06-30 NOTE — ED Triage Notes (Signed)
Pt reports that her back/"kidneys" for a week. Pt reports she has a urostomy and has to drink water in the morning to produce urine and urine is dark

## 2018-07-01 ENCOUNTER — Emergency Department (HOSPITAL_COMMUNITY): Payer: Medicare Other

## 2018-07-01 ENCOUNTER — Encounter (HOSPITAL_COMMUNITY): Payer: Self-pay | Admitting: *Deleted

## 2018-07-01 ENCOUNTER — Emergency Department (HOSPITAL_COMMUNITY)
Admission: EM | Admit: 2018-07-01 | Discharge: 2018-07-01 | Disposition: A | Discharge status: Home / Self Care | Payer: Medicare Other | Attending: Emergency Medicine | Admitting: Emergency Medicine

## 2018-07-01 DIAGNOSIS — Z932 Ileostomy status: Secondary | ICD-10-CM | POA: Diagnosis not present

## 2018-07-01 DIAGNOSIS — N898 Other specified noninflammatory disorders of vagina: Secondary | ICD-10-CM | POA: Diagnosis not present

## 2018-07-01 DIAGNOSIS — G8929 Other chronic pain: Secondary | ICD-10-CM | POA: Diagnosis not present

## 2018-07-01 DIAGNOSIS — K435 Parastomal hernia without obstruction or  gangrene: Secondary | ICD-10-CM | POA: Diagnosis not present

## 2018-07-01 DIAGNOSIS — R079 Chest pain, unspecified: Secondary | ICD-10-CM | POA: Diagnosis not present

## 2018-07-01 DIAGNOSIS — Z79899 Other long term (current) drug therapy: Secondary | ICD-10-CM | POA: Diagnosis not present

## 2018-07-01 DIAGNOSIS — M5489 Other dorsalgia: Secondary | ICD-10-CM | POA: Diagnosis not present

## 2018-07-01 DIAGNOSIS — K5730 Diverticulosis of large intestine without perforation or abscess without bleeding: Secondary | ICD-10-CM | POA: Diagnosis not present

## 2018-07-01 DIAGNOSIS — R109 Unspecified abdominal pain: Secondary | ICD-10-CM | POA: Diagnosis not present

## 2018-07-01 DIAGNOSIS — Z87891 Personal history of nicotine dependence: Secondary | ICD-10-CM | POA: Diagnosis not present

## 2018-07-01 DIAGNOSIS — R112 Nausea with vomiting, unspecified: Secondary | ICD-10-CM | POA: Diagnosis not present

## 2018-07-01 DIAGNOSIS — Z9889 Other specified postprocedural states: Secondary | ICD-10-CM | POA: Diagnosis not present

## 2018-07-01 LAB — COMPREHENSIVE METABOLIC PANEL
ALK PHOS: 68 U/L (ref 38–126)
ALT: 63 U/L — AB (ref 0–44)
ANION GAP: 8 (ref 5–15)
AST: 57 U/L — ABNORMAL HIGH (ref 15–41)
Albumin: 3.6 g/dL (ref 3.5–5.0)
BILIRUBIN TOTAL: 0.8 mg/dL (ref 0.3–1.2)
BUN: 12 mg/dL (ref 6–20)
CALCIUM: 8.7 mg/dL — AB (ref 8.9–10.3)
CO2: 26 mmol/L (ref 22–32)
CREATININE: 0.63 mg/dL (ref 0.44–1.00)
Chloride: 105 mmol/L (ref 98–111)
GFR calc Af Amer: >60 mL/min (ref >60)
GFR calc non Af Amer: >60 mL/min (ref >60)
Glucose, Bld: 88 mg/dL (ref 70–99)
Potassium: 3.2 mmol/L — ABNORMAL LOW (ref 3.5–5.1)
Sodium: 139 mmol/L (ref 135–145)
TOTAL PROTEIN: 6.8 g/dL (ref 6.5–8.1)

## 2018-07-01 LAB — CBC WITH DIFFERENTIAL/PLATELET
ABS IMMATURE GRANULOCYTES: 0.01 10*3/uL (ref 0.00–0.07)
BASOS ABS: 0 10*3/uL (ref 0.0–0.1)
Basophils Relative: 0 %
EOS PCT: 1 %
Eosinophils Absolute: 0.1 10*3/uL (ref 0.0–0.5)
HEMATOCRIT: 42.1 % (ref 36.0–46.0)
HEMOGLOBIN: 13.9 g/dL (ref 12.0–15.0)
Immature Granulocytes: 0 %
LYMPHS PCT: 21 %
Lymphs Abs: 0.9 10*3/uL (ref 0.7–4.0)
MCH: 30.6 pg (ref 26.0–34.0)
MCHC: 33 g/dL (ref 30.0–36.0)
MCV: 92.7 fL (ref 80.0–100.0)
MONO ABS: 0.4 10*3/uL (ref 0.1–1.0)
MONOS PCT: 10 %
NEUTROS ABS: 2.9 10*3/uL (ref 1.7–7.7)
Neutrophils Relative %: 68 %
Platelets: 57 10*3/uL — ABNORMAL LOW (ref 150–400)
RBC: 4.54 MIL/uL (ref 3.87–5.11)
RDW: 13.5 % (ref 11.5–15.5)
WBC: 4.3 10*3/uL (ref 4.0–10.5)
nRBC: 0 % (ref 0.0–0.2)

## 2018-07-01 LAB — I-STAT TROPONIN, ED: Troponin i, poc: 0 ng/mL (ref 0.00–0.08)

## 2018-07-01 LAB — LIPASE, BLOOD: Lipase: 118 U/L — ABNORMAL HIGH (ref 11–51)

## 2018-07-01 MED ORDER — DROPERIDOL 2.5 MG/ML IJ SOLN
2.5000 mg | Freq: Once | INTRAMUSCULAR | Status: AC
  Administered 2018-07-01: 2.5 mg via INTRAVENOUS
  Filled 2018-07-01: qty 2

## 2018-07-01 MED ORDER — POTASSIUM CHLORIDE CRYS ER 20 MEQ PO TBCR
40.0000 meq | EXTENDED_RELEASE_TABLET | Freq: Once | ORAL | Status: AC
  Administered 2018-07-01: 40 meq via ORAL
  Filled 2018-07-01: qty 2

## 2018-07-01 MED ORDER — IOPAMIDOL (ISOVUE-300) INJECTION 61%
100.0000 mL | Freq: Once | INTRAVENOUS | Status: AC | PRN
  Administered 2018-07-01: 100 mL via INTRAVENOUS

## 2018-07-01 MED ORDER — KETOROLAC TROMETHAMINE 15 MG/ML IJ SOLN
15.0000 mg | Freq: Once | INTRAMUSCULAR | Status: AC
  Administered 2018-07-01: 15 mg via INTRAVENOUS
  Filled 2018-07-01: qty 1

## 2018-07-01 MED ORDER — FLUOXETINE HCL 20 MG PO CAPS
20.0000 mg | ORAL_CAPSULE | Freq: Every day | ORAL | Status: DC
  Administered 2018-07-01: 20 mg via ORAL
  Filled 2018-07-01: qty 1

## 2018-07-01 MED ORDER — ALPRAZOLAM 0.5 MG PO TABS
1.0000 mg | ORAL_TABLET | Freq: Once | ORAL | Status: AC
  Administered 2018-07-01: 1 mg via ORAL
  Filled 2018-07-01: qty 2

## 2018-07-01 MED ORDER — SODIUM CHLORIDE (PF) 0.9 % IJ SOLN
INTRAMUSCULAR | Status: AC
  Filled 2018-07-01: qty 50

## 2018-07-01 MED ORDER — IOPAMIDOL (ISOVUE-300) INJECTION 61%
INTRAVENOUS | Status: AC
  Filled 2018-07-01: qty 100

## 2018-07-01 MED ORDER — SODIUM CHLORIDE 0.9 % IV BOLUS
1000.0000 mL | Freq: Once | INTRAVENOUS | Status: AC
  Administered 2018-07-01: 1000 mL via INTRAVENOUS

## 2018-07-01 MED ORDER — DICYCLOMINE HCL 20 MG PO TABS: 20.0000 mg | ORAL_TABLET | Freq: Two times a day (BID) | ORAL | 0 refills | Status: DC

## 2018-07-01 NOTE — ED Provider Notes (Signed)
[facility] DEPT Provider Note   CSN: [id number] Arrival date & time: 07/01/18  1641    History   Chief Complaint Chief Complaint  Patient presents with  . Flank Pain    HPI Kathleen Palmer is a 58 y.o. female.     HPI   Abdominal hernia, when stand up it sticks out, burning in the back Pain has been ongoing for 6 months, but worsened this AM. Lives with other pains and didn't think much of this at first, but getting worse, has had 30 major operations, trying to self analyze, thinks could be scar tissue, prayed today and the lord and her husband wanted her to come get checked out to see what was going on.  Pain starts left flank and radiates around to left groin, and radiating into vagina, feels like it is all scar tissue.  Sometimes pain radiates up and now heart is aching too. Sharp pain. Ice pack helps a little ,sometimes excedrin helps.  Has tried to put up with this pain but has been miserable and it has continued to get worse.  Went to ED yesterday and also saw urologist.   Vomiting at times, thinks because of pain.  No diarrhea or constipation. No fevers.    Past Medical History:  Diagnosis Date  . Angiomyolipoma    Left kidney  . Anxiety   . Arthritis   . Attention to urostomy [facility])   . Chronic pain   . Depression   . Hepatitis C    ? Contracted through IVDA  . Panic 04/29/1999  . PTSD (post-traumatic stress disorder)   . Substance abuse ([place])    Remote history- cocaine, ETOH, Marijuana  . Thyroid disease     Patient Active Problem List   Diagnosis Date Noted  . Thrombocytopenia ([place]) 03/11/2018  . Ventral hernia 10/27/2017  . Gastritis due to nonsteroidal anti-inflammatory drug   . Constipation 12/30/2016  . Substance induced mood disorder ([place]) 12/04/2016  . MDD (major depressive disorder), recurrent severe, without psychosis ([place]) 12/04/2016  . Family hx of ALS (amyotrophic lateral sclerosis) 08/31/2015  . Chronic hepatitis  C ([place]) 01/24/2014  . Osteopenia 12/02/2013  . OA (osteoarthritis) of knee 09/20/2013  . Clitoral irritation 08/01/2013  . Epidermoid cyst of skin 08/01/2013  . Atypical chest pain 03/25/2013  . Encounter for screening colonoscopy 03/25/2013  . Knee pain 03/25/2013  . Marijuana use 07/20/2012  . Unspecified vitamin D deficiency 06/02/2012  . Pain 04/07/2012  . Elevated blood pressure (not hypertension) 04/06/2012  . 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 Subclinical hypothyroidism 02/06/2012  . Chronic pain syndrome 02/06/2012  . H/O vaginal surgery 02/06/2012  . 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 Ileostomy status ([place]) 02/06/2012  . 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 PTSD (post-traumatic stress disorder) 02/06/2012  . MDD (major depressive disorder) ([place]) 02/06/2012    Past Surgical History:  Procedure Laterality Date  . ABDOMINAL HYSTERECTOMY    . ABDOMINAL SURGERY    . BIOPSY  01/27/2017   Procedure: BIOPSY;  Surgeon: Danie Binder, MD;  Location: [facility];  Service: Endoscopy;;  gastric  . CHOLECYSTECTOMY    . COLONOSCOPY WITH PROPOFOL N/A 01/27/2017   Procedure: COLONOSCOPY WITH PROPOFOL;  Surgeon: Danie Binder, MD;  Location: [facility] END[facility] SUITE;  Service: Endoscopy;  Laterality: N/A;  12:00pm  . ESOPHAGOGASTRODUODENOSCOPY (EGD) WITH PROPOFOL N/A 01/27/2017   Procedure: ESOPHAGOGASTRODUODENOSCOPY (EGD) WITH PROPOFOL;  Surgeon: Danie Binder, MD;  Location: [facility];  Service: Endoscopy;  Laterality: N/A;  . HERNIA REPAIR     x2- abdomen  . ILEO LOOP CONDUIT    . 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 VAGINA RECONSTRUCTION SURGERY       OB History   No obstetric history on file.      Home Medications    Prior to Admission medications   Medication Sig Start Date End Date Taking? Authorizing Provider  Acetaminophen-Caffeine (EXCEDRIN  ASPIRIN FREE PO) Take 2 tablets by mouth daily as needed.   Yes [provider]  ALPRAZolam Duanne Moron) 1 MG tablet Take 1 tablet (1 mg total) by mouth 3 (three) times daily as needed for anxiety. 06/01/18 06/01/19 Yes Cloria Spring, MD  FLUoxetine (PROZAC) 20 MG capsule Take 1 capsule (20 mg total) by mouth 2 (two) times daily. 06/01/18  Yes Cloria Spring, MD  Multiple Vitamins-Minerals (CENTRUM PO) Take 1 tablet by mouth every evening.   Yes [provider]  traZODone (DESYREL) 100 MG tablet Take 2 tablets (200 mg total) by mouth at bedtime as needed for sleep. 06/01/18  Yes Cloria Spring, MD  dicyclomine (BENTYL) 20 MG tablet Take 1 tablet (20 mg total) by mouth 2 (two) times daily. 07/01/18   Gareth Morgan, MD    Family History Family History  Adopted: Yes  Problem Relation Age of Onset  . Depression Mother   . Depression Sister   . Anxiety disorder Sister   . Bipolar disorder Sister   . Depression Maternal Grandmother   . ADD / ADHD Neg Hx   . Alcohol abuse Neg Hx   . Drug abuse Neg Hx   . Dementia Neg Hx   . OCD Neg Hx   . Paranoid behavior Neg Hx   . Schizophrenia Neg Hx   . Seizures Neg Hx   . Sexual abuse Neg Hx   . Physical abuse Neg Hx   . Suicidality Neg Hx   . Colon cancer Neg Hx     Social History Social History   Tobacco Use  . Smoking status: Former Smoker    Packs/day: 0.25    Years: 33.00    Pack years: 8.25    Types: Cigarettes    Last attempt to quit: 01/21/2015    Years since quitting: 3.4  . Smokeless tobacco: Never Used  . Tobacco comment: 9-10 cigs a day as of 10/20/2012, (02-07-15 per pt, she stopped smoking 01-21-15)  Substance Use Topics  . Alcohol use: No    Alcohol/week: 0.0 standard drinks  . Drug use: Not Currently    Types: Marijuana    Comment: quit 10 days      Allergies   Oxycodone; Abilify [aripiprazole]; Bactrim [sulfamethoxazole-trimethoprim]; Gabapentin; and Ampicillin   Review of Systems Review of Systems    Constitutional: Negative for fever.  Respiratory: Negative for cough and shortness of breath.   Cardiovascular: Positive for chest pain.  Gastrointestinal: Positive for abdominal pain, nausea and vomiting. Negative for constipation and diarrhea.  Genitourinary: Positive for flank pain and vaginal discharge (for 6 mos). Negative for vaginal bleeding.  Musculoskeletal: Negative for neck pain.  Skin: Negative for rash.  Neurological: Positive for headaches.     Physical Exam Updated Vital Signs BP 110/62   Pulse (!) 50   Temp 98.1 F (36.7 C) (Oral)   Resp 15   Ht  5\' 3"  (1.6 m)   Wt 73 kg   SpO2 98%   BMI 28.52 kg/m   Physical Exam Vitals signs and nursing note reviewed.  Constitutional:      General: She is not in acute distress.    Appearance: She is well-developed. She is not diaphoretic.  HENT:     Head: Normocephalic and atraumatic.  Eyes:     Conjunctiva/sclera: Conjunctivae normal.  Neck:     Musculoskeletal: Normal range of motion.  Cardiovascular:     Rate and Rhythm: Normal rate and regular rhythm.     Heart sounds: Normal heart sounds. No murmur. No friction rub. No gallop.   Pulmonary:     Effort: Pulmonary effort is normal. No respiratory distress.     Breath sounds: Normal breath sounds. No wheezing or rales.  Abdominal:     General: There is no distension.     Palpations: Abdomen is soft.     Tenderness: There is abdominal tenderness (diffuse). There is no guarding.     Comments: Urostomy, multiple abdominal scars   Genitourinary:    Comments: Clitoris with erythema, mild Musculoskeletal:        General: No tenderness.  Skin:    General: Skin is warm and dry.     Findings: No erythema or rash.  Neurological:     Mental Status: She is alert and oriented to person, place, and time.      ED Treatments / Results  Labs (all labs ordered are listed, but only abnormal results are displayed) Labs Reviewed  CBC WITH DIFFERENTIAL/PLATELET -  Abnormal; Notable for the following components:      Result Value   Platelets 57 (*)    All other components within normal limits  COMPREHENSIVE METABOLIC PANEL - Abnormal; Notable for the following components:   Potassium 3.2 (*)    Calcium 8.7 (*)    AST 57 (*)    ALT 63 (*)    All other components within normal limits  LIPASE, BLOOD - Abnormal; Notable for the following components:   Lipase 118 (*)    All other components within normal limits  WET PREP, GENITAL  I-STAT TROPONIN, ED  GC/CHLAMYDIA PROBE AMP ([place]) NOT AT [facility]    EKG None  Radiology Dg Chest 2 View  Result Date: 07/01/2018 CLINICAL DATA:  Right flank pain x6 months with pain in the stomach related to hernia. Ex-smoker. EXAM: CHEST - 2 VIEW COMPARISON:  None. FINDINGS: The heart size and mediastinal contours are within normal limits. Both lungs are clear. The visualized skeletal structures are unremarkable. IMPRESSION: No active cardiopulmonary disease. Electronically Signed   By: Ashley Royalty M.D.   On: 07/01/2018 18:54   Ct Abdomen Pelvis W Contrast  Result Date: 07/01/2018 CLINICAL DATA:  Abd pain, diverticulitis suspected. Patient reports right flank pain for 6 months as well as stomach pain. EXAM: CT ABDOMEN AND PELVIS WITH CONTRAST TECHNIQUE: Multidetector CT imaging of the abdomen and pelvis was performed using the standard protocol following bolus administration of intravenous contrast. CONTRAST:  157mL ISOVUE-300 IOPAMIDOL (ISOVUE-300) INJECTION 61% COMPARISON:  Noncontrast CT performed yesterday at [facility], CT 12/02/2016 also reviewed FINDINGS: Lower chest: The lung bases are clear. Hepatobiliary: Nodular hepatic contours. No discrete lesion. Clips in the gallbladder fossa postcholecystectomy. No biliary dilatation. Pancreas: No ductal dilatation or inflammation. Spleen: Chronic splenomegaly with spleen measuring 17.9 x 13.6 x 7.1 cm (volume = 910 cm^3). Tiny subcentimeter low-density in the anterior  spleen is nonspecific.  Splenic cleft posteriorly. Adrenals/Urinary Tract: Normal adrenal glands. Unchanged mild pelvicaliectasis without hydronephrosis. No hydroureter on the current exam. Punctate nonobstructing right renal stone on noncontrast exam is not appreciated. Urostomy is unremarkable in appearance. Similar appearance of bilateral renal cortical scarring. Patient with history of bladder exstrophy. Unchanged soft tissue density at the level of the bladder with channel extending to the skin surface, unchanged from 2018 exam. Stomach/Bowel: Stomach partially distended. No small bowel wall thickening inflammatory change, or obstruction. Postsurgical change of the anterior abdominal wall with bowel loops opposed in the lower pelvis, possible adhesions. Colonic diverticulosis without diverticulitis. Appendix not visualized and may be surgically absent. No acute bowel inflammation. Vascular/Lymphatic: Aortic atherosclerosis without aneurysm. Portal vein is patent. Prominent porta hepatis nodes are unchanged dating back to 2018 and presumed reactive. No new or progressive adenopathy. Reproductive: Post hysterectomy. No adnexal mass. Other: No ascites. No free air. Postsurgical change of the anterior abdominal wall with probable mesh and abdominal wall laxity. Mild herniation of fat at the superior margin of the mesh in the left upper quadrant has progressed from 2018 exam. Musculoskeletal: Dysplastic appearance of pelvis with left sacroiliac joint effusion and right sacroiliac joint osteoarthritis. Degenerative change of the right hip. No acute osseous abnormalities. IMPRESSION: 1. No acute abnormality. No change from noncontrast CT performed yesterday. 2. Colonic diverticulosis without diverticulitis. 3. Postsurgical changes the anterior abdominal wall with herniation of fat superior to the mesh in the left upper quadrant. Fat herniation has slightly progressed in 2018 CT. 4. Stable splenomegaly. Nodular hepatic  contours suggesting cirrhosis. 5. Bilateral renal cortical scarring. Urostomy unremarkable in appearance. 6.  Aortic Atherosclerosis (ICD10-I70.0). Electronically Signed   By: Keith Rake M.D.   On: 07/01/2018 20:13   Ct Renal Stone Study  Result Date: 06/30/2018 CLINICAL DATA:  Dark urine for a week.  Patient has a urostomy. EXAM: CT ABDOMEN AND PELVIS WITHOUT CONTRAST TECHNIQUE: Multidetector CT imaging of the abdomen and pelvis was performed following the standard protocol without IV contrast. COMPARISON:  12/02/2016 FINDINGS: Lower chest: The included heart size is top normal. No pericardial effusion. Small hiatal hernia. Hepatobiliary: The unenhanced liver is unremarkable. No biliary dilatation or space-occupying mass given limitations of a noncontrast study. Slight surface nodularity of the liver raises concern for morphologic changes of cirrhosis. Cholecystectomy clips are present. Pancreas: Normal Spleen: Splenomegaly with the spleen measuring 16.4 x 13.2 x 5.3 cm (volume = 600 cm^3). No mass noted. Adrenals/Urinary Tract: Normal bilateral adrenal glands. Punctate nonobstructing calculus is noted in the lower pole the right kidney. Bilateral renal cortical scarring, worse on the right with patent ureters leading to a ileal loop conduit exiting via the ventral upper pelvis. Mild pelvocaliectasis is noted bilaterally. Patient has a history of bladder exstrophy and history of fistula. Stomach/Bowel: Colonic diverticulosis without acute diverticulitis. No bowel inflammation or obstruction. Vascular/Lymphatic: Aortoiliac atherosclerosis without aneurysm. No pathologically enlarged lymph nodes by CT size criteria. Reproductive: Hysterectomy. No adnexal mass. Presumed postop change and scarring in the region of the vulva and vagina. Other: Left ventral hernia containing omental fat and nonobstructed small bowel. Atrophy of the lower rectus muscles bilaterally. Musculoskeletal: Dysmorphic appearance of the  pelvis with marked pubic symphysis diastasis, fused left SI joint and osteoarthritis of the right SI joint. Dysplastic appearance of the right iliac bone. Both femoral heads are seated within their respective acetabular components with degenerative joint space narrowing of both hips. Gentle levoconvex curvature of the lumbar spine. IMPRESSION: 1. Dysplastic appearance of the pelvis with  pubic symphysis diastasis, fused left SI joint and osteoarthritis of both hips and right SI joint in this patient with history of bladder exstrophy. 2. Chronic bilateral renal cortical scarring and pelvocaliectasis without obstructive uropathy. Nonobstructing punctate calculus is noted in the lower pole the right kidney. Ileal conduit appears patent. 3. Stable splenomegaly. 4. Mild morphologic changes of cirrhosis without mass or ascites. Electronically Signed   By: Ashley Royalty M.D.   On: 06/30/2018 16:44    Procedures Procedures (including critical care time)  Medications Ordered in ED Medications  sodium chloride 0.9 % bolus 1,000 mL (0 mLs Intravenous Stopped 07/01/18 2110)  ketorolac (TORADOL) 15 MG/ML injection 15 mg (15 mg Intravenous Given 07/01/18 1848)  droperidol (INAPSINE) 2.5 MG/ML injection 2.5 mg (2.5 mg Intravenous Given 07/01/18 1902)  ALPRAZolam (XANAX) tablet 1 mg (1 mg Oral Given 07/01/18 1843)  iopamidol (ISOVUE-300) 61 % injection 100 mL (100 mLs Intravenous Contrast Given 07/01/18 1941)  ketorolac (TORADOL) 15 MG/ML injection 15 mg (15 mg Intravenous Given 07/01/18 2211)  potassium chloride SA (K-DUR,KLOR-CON) CR tablet 40 mEq (40 mEq Oral Given 07/01/18 2211)     Initial Impression / Assessment and Plan / ED Course  I have reviewed the triage vital signs and the nursing notes.  Pertinent labs & imaging results that were available during my care of the patient were reviewed by me and considered in my medical decision making (see chart for details).        58yo female with history of multiple  abdominal surgeries, surgeries, PTSD, depression, presents with good friend at bedside with concern for abdominal pain. She was seen yesterday at [facility] for the same and had stone study that was WNL.  Will order contrasted scan today to more closely evaluate for other inflammatory changes, abscesses.  Reports pain radiates to chest and checked ECG and troponin which were wihout acute changes and doubt ACS.  Has hx of hysterectomy and doubt PID, attempted pelvic exam however she declined. She is noted to have clitoral irritation on exam and recommend GYN follow up.  Doubt torsion, TOA by history, exam. Urine cx collected yesterday from urostomy, multiple species present.  CT returned showing no acute abnormalities. Suspect pain secondary to scar tissue, exacerbation of chronic pain in setting of multiple abdominal surgeries.  Recommend follow up with PCP and pain specialist.  Given toradol, droperidol with improvement in nausea and pain. Given rx for bentyl.  Patient discharged in stable condition with understanding of reasons to return.   Final Clinical Impressions(s) / ED Diagnoses   Final diagnoses:  H/O major abdominal surgery  Chronic abdominal pain    ED Discharge Orders         Ordered    dicyclomine (BENTYL) 20 MG tablet  2 times daily     07/01/18 2132           Gareth Morgan, MD 07/02/18 1405

## 2018-07-01 NOTE — ED Triage Notes (Signed)
Pt complains of right flank pain x 6 months and pain in stomach related to hernia.

## 2018-07-02 LAB — URINE CULTURE

## 2018-07-06 DIAGNOSIS — K7290 Hepatic failure, unspecified without coma: Secondary | ICD-10-CM | POA: Diagnosis not present

## 2018-07-06 DIAGNOSIS — B182 Chronic viral hepatitis C: Secondary | ICD-10-CM | POA: Diagnosis not present

## 2018-07-06 DIAGNOSIS — K7469 Other cirrhosis of liver: Secondary | ICD-10-CM | POA: Diagnosis not present

## 2018-07-07 ENCOUNTER — Ambulatory Visit (INDEPENDENT_AMBULATORY_CARE_PROVIDER_SITE_OTHER): Payer: Medicare Other | Admitting: Family Medicine

## 2018-07-07 ENCOUNTER — Other Ambulatory Visit: Payer: Self-pay

## 2018-07-07 ENCOUNTER — Encounter: Payer: Self-pay | Admitting: Family Medicine

## 2018-07-07 VITALS — BP 128/78 | HR 82 | Temp 98.4°F | Resp 14 | Ht 63.0 in | Wt 171.0 lb

## 2018-07-07 DIAGNOSIS — T39395A Adverse effect of other nonsteroidal anti-inflammatory drugs [NSAID], initial encounter: Secondary | ICD-10-CM

## 2018-07-07 DIAGNOSIS — K439 Ventral hernia without obstruction or gangrene: Secondary | ICD-10-CM | POA: Diagnosis not present

## 2018-07-07 DIAGNOSIS — K2960 Other gastritis without bleeding: Secondary | ICD-10-CM

## 2018-07-07 DIAGNOSIS — G894 Chronic pain syndrome: Secondary | ICD-10-CM | POA: Diagnosis not present

## 2018-07-07 DIAGNOSIS — E876 Hypokalemia: Secondary | ICD-10-CM

## 2018-07-07 DIAGNOSIS — I700 Atherosclerosis of aorta: Secondary | ICD-10-CM | POA: Diagnosis not present

## 2018-07-07 DIAGNOSIS — D696 Thrombocytopenia, unspecified: Secondary | ICD-10-CM

## 2018-07-07 DIAGNOSIS — B182 Chronic viral hepatitis C: Secondary | ICD-10-CM

## 2018-07-07 DIAGNOSIS — K5900 Constipation, unspecified: Secondary | ICD-10-CM

## 2018-07-07 DIAGNOSIS — Z932 Ileostomy status: Secondary | ICD-10-CM | POA: Diagnosis not present

## 2018-07-07 MED ORDER — LACTULOSE 10 GM/15ML PO SOLN: 20.0000 g | Freq: Two times a day (BID) | ORAL | 3 refills | Status: AC | PRN

## 2018-07-07 NOTE — Patient Instructions (Addendum)
Referral to [facility] for hernia Referral to GI- Dr. Gala Romney Use bentyl (dicyclomine only as needed ) I have sent the lactulose F/U 4 months for wellness exam

## 2018-07-07 NOTE — Progress Notes (Signed)
   Subjective:    Patient ID: Kathleen Palmer, female    DOB: Mar 10, 1961, 58 y.o.   MRN: [id number]  Patient presents for ER F/U (abd pain)  Pt here for F/U chronic medical problems  Her sister died in 01-26-23 and she states she went  into mourning and was very depressed for many months, she did finially go talk to her psychiatrist, now on prozac and she feel much better    Burning sensation around the top abdomen, has been in ER twice, CT scan showed diverticulosis, small kidney stone, but no acute infection to cause her pain. seen by liver specialist/ Hep C clinic in followup , recommended EGD , recommended she start lactulose 65ml BID but has been unable to get this med  She also wants to go back to [facility] to discuss her ventral hernia and states she will follow up with the appointment this time    Review Of Systems:  GEN- denies fatigue, fever, weight loss,weakness, recent illness HEENT- denies eye drainage, change in vision, nasal discharge, CVS- denies chest pain, palpitations RESP- denies SOB, cough, wheeze ABD- denies N/V, change in stools, +abd pain GU- denies dysuria, hematuria, dribbling, incontinence MSK- denies joint pain, muscle aches, injury Neuro- denies headache, dizziness, syncope, seizure activity       Objective:    BP 128/78   Pulse 82   Temp 98.4 F (36.9 C) (Oral)   Resp 14   Ht 5\' 3"  (1.6 m)   Wt 171 lb (77.6 kg)   SpO2 96%   BMI 30.29 kg/m  GEN- NAD, alert and oriented x3 HEENT- PERRL, EOMI, non injected sclera, pink conjunctiva, MMM, oropharynx clear Neck- Supple, no thyromegaly CVS- RRR, no murmur RESP-CTAB ABD-NABS,soft,ostomy in tact, right ventral hernia EXT- No edema Pulses- Radial 2+        Assessment & Plan:      Problem List Items Addressed This Visit      Unprioritized   Aortic atherosclerosis (HCC)    Noted on CT scan, but in setting of cirrhosis of liver and chronic hepatitis will hold on starting statin drug at this time      Chronic hepatitis C (HCC) - Primary   Chronic pain syndrome    Complains of chronic abd and GU pain, but declines going to pain clinic      Constipation    Lactulose sent, continue linzess Send back to GI for evaluation, no longer on PPI      Relevant Orders   Ambulatory referral to Gastroenterology   Gastritis due to nonsteroidal anti-inflammatory drug   Relevant Orders   Ambulatory referral to Gastroenterology   Ileostomy status [facility])   Relevant Orders   Ambulatory referral to General Surgery   Thrombocytopenia ([place])   Ventral hernia    Referral to tertiatry center as she has had multiple operations in the past       Relevant Orders   Ambulatory referral to General Surgery    Other Visit Diagnoses    Hypokalemia       Relevant Orders   Basic metabolic panel (Completed)      Note: This dictation was prepared with Dragon dictation along with smaller phrase technology. Any transcriptional errors that result from this process are unintentional.

## 2018-07-08 ENCOUNTER — Encounter: Payer: Self-pay | Admitting: Family Medicine

## 2018-07-08 ENCOUNTER — Encounter: Payer: Self-pay | Admitting: *Deleted

## 2018-07-08 LAB — BASIC METABOLIC PANEL
BUN: 9 mg/dL (ref 7–25)
CO2: 32 mmol/L (ref 20–32)
Calcium: 9 mg/dL (ref 8.6–10.4)
Chloride: 105 mmol/L (ref 98–110)
Creat: 0.77 mg/dL (ref 0.50–1.05)
Glucose, Bld: 83 mg/dL (ref 65–99)
Potassium: 4.4 mmol/L (ref 3.5–5.3)
Sodium: 141 mmol/L (ref 135–146)

## 2018-07-08 NOTE — Assessment & Plan Note (Signed)
Complains of chronic abd and GU pain, but declines going to pain clinic

## 2018-07-08 NOTE — Assessment & Plan Note (Signed)
Referral to tertiatry center as she has had multiple operations in the past

## 2018-07-08 NOTE — Assessment & Plan Note (Signed)
Noted on CT scan, but in setting of cirrhosis of liver and chronic hepatitis will hold on starting statin drug at this time

## 2018-07-08 NOTE — Assessment & Plan Note (Signed)
Lactulose sent, continue linzess Send back to GI for evaluation, no longer on PPI

## 2018-07-14 ENCOUNTER — Ambulatory Visit (HOSPITAL_COMMUNITY): Payer: Medicare Other | Admitting: Licensed Clinical Social Worker

## 2018-07-15 ENCOUNTER — Encounter: Payer: Self-pay | Admitting: Gastroenterology

## 2018-07-28 ENCOUNTER — Other Ambulatory Visit: Payer: Self-pay

## 2018-07-28 ENCOUNTER — Ambulatory Visit (INDEPENDENT_AMBULATORY_CARE_PROVIDER_SITE_OTHER): Payer: Medicare Other | Admitting: Licensed Clinical Social Worker

## 2018-07-28 ENCOUNTER — Encounter (HOSPITAL_COMMUNITY): Payer: Self-pay | Admitting: Licensed Clinical Social Worker

## 2018-07-28 DIAGNOSIS — F332 Major depressive disorder, recurrent severe without psychotic features: Secondary | ICD-10-CM | POA: Diagnosis not present

## 2018-07-28 NOTE — Progress Notes (Signed)
Virtual Visit via Telephone Note  I connected with Kathleen Palmer on 07/28/18 at 11:00 AM EDT by telephone and verified that I am speaking with the correct person using two identifiers.   I discussed the limitations, risks, security and privacy concerns of performing an evaluation and management service by telephone and the availability of in person appointments. I also discussed with the patient that there may be a patient responsible charge related to this service. The patient expressed understanding and agreed to proceed.   History of Present Illness: who presents oriented x4 (person, place, situation, and object), distracted, casually groomed, causually dressed and cooperative to address depression. Patient has a history of medical issues and mental health treatment. She denies symptoms of mania. Patient denies suicidal and homicidal ideations. She denies psychosis including auditory and visual hallucinations. Patient admits to cannabis use. She is at low risk for lethality. Patient has been hospitalized 7 times due to suicidal ideations but states she has never attempted due to her religious faith.    Observations/Objective: Physical: Patient was in the hospital at the beginning of the month. She was dehydrated. She went to the hospital and got treatment. She is going to have surgery on her liver and hernia.  Spiritual/value: Patient continues to have a strong faith. She watches Lorriane Shire every morning and does Bible study every day.   Relationships: Patient and husband are getting along. Patient is staying connected with friends by text and phone call.  Emotional/Mental/Behavior: Patient is feeling a little better. She recognizes that she will experience depression all her life but she has to manage it. She has been challenging her thoughts and not allowing the negative thoughts to take root. She made the decision to stop grieving her sister because it was overwhelming her. Patient was tired of  crying and feeling down. Patient has made the choice to get up daily, open the blinds to let the sun in, make coffee, and clean her home on a daily basis. She recognizes that she has a lot of life to live.   Patient engaged in session. She responded well to interventions. Patient continues to meet criteria for Major Depressive disorder, recurrent episode, severe with anxious distress. Patient will continue in outpatient therapy due to being the least restrictive service to meet her needs. Patient made moderate progress on her goals at this time.   Assessment and Plan: Therapist reviewed patient's recent thoughts and behaviors. Therapist utilized CBT to address depression. Therapist reviewed patient's goals.  Therapist processed patient's feelings to identify triggers for mood. Therapist discussed with patient daily steps she can take to manage her depression and anxiety.   Suicidal/Homicidal: Negativewithout intent/plan  Follow Up Instructions: Return again in 4 weeks.   I discussed the assessment and treatment plan with the patient. The patient was provided an opportunity to ask questions and all were answered. The patient agreed with the plan and demonstrated an understanding of the instructions.   The patient was advised to call back or seek an in-person evaluation if the symptoms worsen or if the condition fails to improve as anticipated.  I provided 40 minutes of non-face-to-face time during this encounter.   [name], LCSW

## 2018-07-30 ENCOUNTER — Other Ambulatory Visit: Payer: Self-pay

## 2018-07-30 ENCOUNTER — Encounter (HOSPITAL_COMMUNITY): Payer: Self-pay | Admitting: Psychiatry

## 2018-07-30 ENCOUNTER — Ambulatory Visit (INDEPENDENT_AMBULATORY_CARE_PROVIDER_SITE_OTHER): Payer: Medicare Other | Admitting: Psychiatry

## 2018-07-30 DIAGNOSIS — F332 Major depressive disorder, recurrent severe without psychotic features: Secondary | ICD-10-CM

## 2018-07-30 DIAGNOSIS — Z79899 Other long term (current) drug therapy: Secondary | ICD-10-CM

## 2018-07-30 MED ORDER — ALPRAZOLAM 1 MG PO TABS: 1.0000 mg | ORAL_TABLET | Freq: Three times a day (TID) | ORAL | 2 refills | Status: DC | PRN

## 2018-07-30 MED ORDER — TRAZODONE HCL 100 MG PO TABS: 200.0000 mg | ORAL_TABLET | Freq: Every evening | ORAL | 2 refills | Status: DC | PRN

## 2018-07-30 MED ORDER — FLUOXETINE HCL 20 MG PO CAPS: 20.0000 mg | ORAL_CAPSULE | Freq: Two times a day (BID) | ORAL | 2 refills | Status: DC

## 2018-07-30 NOTE — Progress Notes (Signed)
Virtual Visit via Telephone Note  I connected with Kathleen Palmer on 07/30/18 at 10:40 AM EDT by telephone and verified that I am speaking with the correct person using two identifiers.   I discussed the limitations, risks, security and privacy concerns of performing an evaluation and management service by telephone and the availability of in person appointments. I also discussed with the patient that there may be a patient responsible charge related to this service. The patient expressed understanding and agreed to proceed.      I discussed the assessment and treatment plan with the patient. The patient was provided an opportunity to ask questions and all were answered. The patient agreed with the plan and demonstrated an understanding of the instructions.   The patient was advised to call back or seek an in-person evaluation if the symptoms worsen or if the condition fails to improve as anticipated.  I provided 15 minutes of non-face-to-face time during this encounter.   Levonne Spiller, MD  [facility] MD/PA/NP OP Progress Note  07/30/2018 10:56 AM Kathleen Palmer  MRN:  [id number]  Chief Complaint:  Chief Complaint    Depression; Anxiety; Follow-up     HPI: This patient is a 58 year old married white female who lives with her husband in [place].  She is on disability.  The patient has a diagnoses of depression anxiety and posttraumatic stress disorder.  The patient is assessed today via telephone interview due to the coronavirus epidemic.  She states that overall she is doing okay emotionally.  She is in a lot of pain from her abdominal hernia.  She was seen in the emergency room earlier in the month and a CT scan did not reveal anything new.  Her primary doctor has referred her to [facility] but the visit is not until June.  She states that the pain is not generally unbearable right now.  She is staying around home and not getting out because of the state home orders.  Surprisingly her her mood and  anxiety are under good control.  She states that she is sleeping quite well.  She is trying to stay active and do a little bit walking around her house.  She denies any thoughts of self-harm or suicide or crying spells.  She is not showing the same level of desperation that she has in the past and she thinks the fluoxetine is really helping her depression. Visit Diagnosis:    ICD-10-CM   1. Major depressive disorder, recurrent episode, severe with anxious distress ([place]) F33.2     Past Psychiatric History: Several previous hospitalizations for depression and suicidal ideation, the last 1 about 5 months ago  Past Medical History:  Past Medical History:  Diagnosis Date  . Angiomyolipoma    Left kidney  . Anxiety   . Arthritis   . Attention to urostomy [facility])   . Chronic pain   . Depression   . Hepatitis C    ? Contracted through IVDA  . Panic 04/29/1999  . PTSD (post-traumatic stress disorder)   . Substance abuse ([place])    Remote history- cocaine, ETOH, Marijuana  . Thyroid disease     Past Surgical History:  Procedure Laterality Date  . ABDOMINAL HYSTERECTOMY    . ABDOMINAL SURGERY    . BIOPSY  01/27/2017   Procedure: BIOPSY;  Surgeon: Danie Binder, MD;  [facility];  Service: Endoscopy;;  gastric  . CHOLECYSTECTOMY    . COLONOSCOPY WITH PROPOFOL N/A 01/27/2017   Procedure:  COLONOSCOPY WITH PROPOFOL;  Surgeon: Danie Binder, MD;  Location[facility];  Service: Endoscopy;  Laterality: N/A;  12:00pm  . ESOPHAGOGASTRODUODENOSCOPY (EGD) WITH PROPOFOL N/A 01/27/2017   Procedure: ESOPHAGOGASTRODUODENOSCOPY (EGD) WITH PROPOFOL;  Surgeon: Danie Binder, MD;  Location: [facility];  Service: Endoscopy;  Laterality: N/A;  . HERNIA REPAIR     x2- abdomen  . ILEO LOOP CONDUIT    . 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 VAGINA RECONSTRUCTION SURGERY       Family Psychiatric History: See below  Family History:  Family History  Adopted: Yes  Problem Relation Age of Onset  . Depression Mother   . Depression Sister   . Anxiety disorder Sister   . Bipolar disorder Sister   . Depression Maternal Grandmother   . ADD / ADHD Neg Hx   . Alcohol abuse Neg Hx   . Drug abuse Neg Hx   . Dementia Neg Hx   . OCD Neg Hx   . Paranoid behavior Neg Hx   . Schizophrenia Neg Hx   . Seizures Neg Hx   . Sexual abuse Neg Hx   . Physical abuse Neg Hx   . Suicidality Neg Hx   . Colon cancer Neg Hx     Social History:  Social History   Socioeconomic History  . Marital status: Married    Spouse name: Not on file  . Number of children: 0  . Years of education: Not on file  . Highest education level: Not on file  Occupational History  . Occupation: disability  Social Needs  . Financial resource strain: Not on file  . Food insecurity:    Worry: Not on file    Inability: Not on file  . Transportation needs:    Medical: Not on file    Non-medical: Not on file  Tobacco Use  . Smoking status: Former Smoker    Packs/day: 0.25    Years: 33.00    Pack years: 8.25    Types: Cigarettes    Last attempt to quit: 01/21/2015    Years since quitting: 3.5  . Smokeless tobacco: Never Used  . Tobacco comment: 9-10 cigs a day as of 10/20/2012, (02-07-15 per pt, she stopped smoking 01-21-15)  Substance and Sexual Activity  . Alcohol use: No    Alcohol/week: 0.0 standard drinks  . Drug use: Not Currently    Types: Marijuana    Comment: quit 10 days   . Sexual activity: Yes    Birth control/protection: Surgical  Lifestyle  . Physical activity:    Days per week: Not on file    Minutes per session: Not on file  . Stress: Not on file  Relationships  . Social connections:    Talks on phone: Not on file    Gets together: Not on file    Attends religious service: Not on file    Active member of club or organization: Not on file    Attends meetings of  clubs or organizations: Not on file    Relationship status: Not on file  Other Topics Concern  . Not on file  Social History Narrative  . Not on file    Allergies:  Allergies  Allergen Reactions  . Oxycodone Anxiety  . Abilify [Aripiprazole]     Stiff neck  . Bactrim [Sulfamethoxazole-Trimethoprim]     Tongue  swelled  . Gabapentin     Dizziness to the point she actually fell  . Ampicillin Rash    Has patient had a PCN reaction causing immediate rash, facial/tongue/throat swelling, SOB or lightheadedness with hypotension: Unknown Has patient had a PCN reaction causing severe rash involving mucus membranes or skin necrosis: Unknown Has patient had a PCN reaction that required hospitalization: No Has patient had a PCN reaction occurring within the last 10 years: No If all of the above answers are "NO", then may proceed with Cephalosporin use.      Metabolic Disorder Labs: Lab Results  Component Value Date   HGBA1C 5.2 03/10/2018   MPG 102.54 03/10/2018   MPG 102.54 12/05/2016   No results found for: PROLACTIN Lab Results  Component Value Date   CHOL 137 03/10/2018   TRIG 78 03/10/2018   HDL 37 (L) 03/10/2018   CHOLHDL 3.7 03/10/2018   VLDL 16 03/10/2018   LDLCALC 84 03/10/2018   LDLCALC 70 12/05/2016   Lab Results  Component Value Date   TSH 14.938 (H) 03/10/2018   TSH 4.65 (H) 06/09/2017    Therapeutic Level Labs: No results found for: LITHIUM No results found for: VALPROATE No components found for:  CBMZ  Current Medications: Current Outpatient Medications  Medication Sig Dispense Refill  . Acetaminophen-Caffeine (EXCEDRIN ASPIRIN FREE PO) Take 2 tablets by mouth daily as needed.    . ALPRAZolam (XANAX) 1 MG tablet Take 1 tablet (1 mg total) by mouth 3 (three) times daily as needed for anxiety. 90 tablet 2  . dicyclomine (BENTYL) 20 MG tablet Take 1 tablet (20 mg total) by mouth 2 (two) times daily. 20 tablet 0  . FLUoxetine (PROZAC) 20 MG capsule Take 1  capsule (20 mg total) by mouth 2 (two) times daily. 60 capsule 2  . lactulose (CHRONULAC) 10 GM/15ML solution Take 30 mLs (20 g total) by mouth 2 (two) times daily as needed for mild constipation. 1892 mL 3  . Multiple Vitamins-Minerals (CENTRUM PO) Take 1 tablet by mouth every evening.    . traZODone (DESYREL) 100 MG tablet Take 2 tablets (200 mg total) by mouth at bedtime as needed for sleep. 60 tablet 2   No current facility-administered medications for this visit.      Musculoskeletal: Strength & Muscle Tone: unable to assss, telephone interview Gait & Station:  Patient leans:   Psychiatric Specialty Exam: Review of Systems  Gastrointestinal: Positive for abdominal pain.  All other systems reviewed and are negative.   There were no vitals taken for this visit.There is no height or weight on file to calculate BMI.  General Appearance: NA  Eye Contact:  NA  Speech:  Clear and Coherent  Volume:  Normal  Mood:  Euthymic  Affect:  NA  Thought Process:  Goal Directed  Orientation:  Full (Time, Place, and Person)  Thought Content: Rumination   Suicidal Thoughts:  No  Homicidal Thoughts:  No  Memory:  Immediate;   Good Recent;   Good Remote;   Fair  Judgement:  Fair  Insight:  Fair  Psychomotor Activity:  Decreased  Concentration:  Concentration: Good and Attention Span: Good  Recall:  Good  Fund of Knowledge: Good  Language: Good  Akathisia:  No  Handed:  Right  AIMS (if indicated): not done  Assets:  Communication Skills Desire for Improvement Resilience Social Support Talents/Skills  ADL's:  Intact  Cognition: WNL  Sleep:  Good   Screenings: AIMS     Admission (  Discharged) from OP Visit from 03/10/2018 in [facility] Admission (Discharged) from 12/03/2016 in [facility]  AIMS Total Score  0  0    AUDIT     Admission (Discharged) from OP Visit from 03/10/2018 in [facility] Admission (Discharged) from 12/03/2016 in [facility]  Alcohol Use Disorder Identification Test Final Score (AUDIT)  0  0    MDI     Office Visit from 11/15/2015 in [facility]  Total Score (max 50)  24    PHQ2-9     Office Visit from 07/07/2018 in [place] Office Visit from 10/27/2017 in [place] Office Visit from 01/19/2017 in [place] Office Visit from 12/10/2016 in [place] Office Visit from 10/17/2016 in [place]  PHQ-2 Total Score  0  0  4  4  2   PHQ-9 Total Score  -  -  17  15  8        Assessment and Plan:  This patient is a 58 year old female with a history of depression and anxiety and 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  Right now she seems to be stable on Prozac 20 mg twice daily for depression, Xanax 1 mg 3 times daily for anxiety and trazodone 200 mg at bedtime for sleep.  She will return to see me in 4 weeks  Levonne Spiller, MD 07/30/2018, 10:56 AM

## 2018-08-11 ENCOUNTER — Ambulatory Visit (HOSPITAL_COMMUNITY): Payer: Medicare Other | Admitting: Licensed Clinical Social Worker

## 2018-08-12 ENCOUNTER — Ambulatory Visit (INDEPENDENT_AMBULATORY_CARE_PROVIDER_SITE_OTHER): Payer: Medicare Other | Admitting: Licensed Clinical Social Worker

## 2018-08-12 ENCOUNTER — Encounter (HOSPITAL_COMMUNITY): Payer: Self-pay | Admitting: Licensed Clinical Social Worker

## 2018-08-12 ENCOUNTER — Other Ambulatory Visit: Payer: Self-pay

## 2018-08-12 DIAGNOSIS — F332 Major depressive disorder, recurrent severe without psychotic features: Secondary | ICD-10-CM

## 2018-08-12 NOTE — Progress Notes (Signed)
Virtual Visit via Telephone Note  I connected with Kathleen Palmer on 08/12/18 at  8:00 AM EDT by telephone and verified that I am speaking with the correct person using two identifiers.   I discussed the limitations, risks, security and privacy concerns of performing an evaluation and management service by telephone and the availability of in person appointments. I also discussed with the patient that there may be a patient responsible charge related to this service. The patient expressed understanding and agreed to proceed.   History of Present Illness: Major Depressive disorder, recurrent episode, severe with anxious distress.  Kathleen Palmer presents oriented x4 (person, place, situation, and object), distracted, casually groomed, causually dressed and cooperative to address depression. Patient has a history of medical issues and mental health treatment. She denies symptoms of mania. Patient denies suicidal and homicidal ideations. She denies psychosis including auditory and visual hallucinations. Patient admits to cannabis use. She is at low risk for lethality. Patient has been hospitalized 7 times due to suicidal ideations but states she has never attempted due to her religious faith.    Observations/Objective: Physical: [name] physical pain has increased. She tried to lift something that was too heavy and this triggered an increase in her physical pain. Patient was then in bed recovering for two days.  Spiritual/value: No issues identified. Relationships: Patient and husband are getting along but patient did note that her husband has been drinking more which he can get mean and argumentative when this happens.  Emotional/Mental/Behavior: Patient is feeling depressed and overwhelmed. She was in physical pain which caused her to stay in bed to recover which lead to feelings of depression. She feels like she is maintaining but it is a struggle for her.   Patient engaged in session. She responded well to  interventions. Patient continues to meet criteria for Major Depressive disorder, recurrent episode, severe with anxious distress. Patient will continue in outpatient therapy due to being the least restrictive service to meet her needs. Patient made moderate progress on her goals at this time.   Assessment and Plan: Therapist reviewed patient's recent thoughts and behaviors. Therapist utilized CBT to address depression. Therapist reviewed patient's goals.  Therapist processed patient's feelings to identify triggers for mood. Therapist discussed with patient managing her physical pain to in turn manage her mood.   Suicidal/Homicidal: Negativewithout intent/plan  Follow Up Instructions: Return again in 4 weeks.   I discussed the assessment and treatment plan with the patient. The patient was provided an opportunity to ask questions and all were answered. The patient agreed with the plan and demonstrated an understanding of the instructions.   The patient was advised to call back or seek an in-person evaluation if the symptoms worsen or if the condition fails to improve as anticipated.  I provided 40 minutes of non-face-to-face time during this encounter.   [name], LCSW

## 2018-08-26 ENCOUNTER — Ambulatory Visit (INDEPENDENT_AMBULATORY_CARE_PROVIDER_SITE_OTHER): Payer: Medicare Other | Admitting: Licensed Clinical Social Worker

## 2018-08-26 ENCOUNTER — Other Ambulatory Visit: Payer: Self-pay

## 2018-08-26 ENCOUNTER — Encounter (HOSPITAL_COMMUNITY): Payer: Self-pay | Admitting: Licensed Clinical Social Worker

## 2018-08-26 DIAGNOSIS — F332 Major depressive disorder, recurrent severe without psychotic features: Secondary | ICD-10-CM | POA: Diagnosis not present

## 2018-08-26 NOTE — Progress Notes (Signed)
Virtual Visit via Telephone Note  I connected with Kathleen Palmer on 08/26/18 at  1:00 PM EDT by telephone and verified that I am speaking with the correct person using two identifiers.   I discussed the limitations, risks, security and privacy concerns of performing an evaluation and management service by telephone and the availability of in person appointments. I also discussed with the patient that there may be a patient responsible charge related to this service. The patient expressed understanding and agreed to proceed.   History of Present Illness: Major Depressive disorder, recurrent episode, severe with anxious distress.  Kathleen Palmer presents oriented x4 (person, place, situation, and object), distracted, casually groomed, causually dressed and cooperative to address depression. Patient has a history of medical issues and mental health treatment. She denies symptoms of mania. Patient denies suicidal and homicidal ideations. She denies psychosis including auditory and visual hallucinations. Patient admits to cannabis use. She is at low risk for lethality. Patient has been hospitalized 7 times due to suicidal ideations but states she has never attempted due to her religious faith.    Observations/Objective: Physical: Patient is in great physical pain. She has been in bed for the last two days. She has upcoming surgeries which may help her with some of the pain. Patient is not connected with a pain management clinic. She has been using over the counter pain relief which no longer touch her pain. She has been fearful of doing pain management due to her past history of drug abuse. She has a healthy level of fear of medication and feels like she would not abuse it.  Spiritual/value: No issues identified. Relationships: Patient noted that her husband has been drinking more and spent the previous day yelling at her. She didn't want to be around him and stayed in bed.   Emotional/Mental/Behavior: Patient is  feeling depressed due to her pain level. She had a suicidal thought (no plan or means) this morning but allowed it to pass through and leave her mind. She has a strong spiritual faith which prevents her from harming herself. She is in a lot of physical pain that limits what she is able to do which leads to feelings of depression. Patient has contacted her doctor and is going in this week for her pain level.   Patient engaged in session. She responded well to interventions. Patient continues to meet criteria for Major Depressive disorder, recurrent episode, severe with anxious distress. Patient will continue in outpatient therapy due to being the least restrictive service to meet her needs. Patient made moderate progress on her goals at this time.   Assessment and Plan: Therapist reviewed patient's recent thoughts and behaviors. Therapist utilized CBT to address depression. Therapist reviewed patient's goals.  Therapist processed patient's feelings to identify triggers for mood. Therapist discussed with patient managing her physical pain to in turn manage her mood.   Suicidal/Homicidal: Negativewithout intent/plan  Follow Up Instructions: Return again in 4 weeks.   I discussed the assessment and treatment plan with the patient. The patient was provided an opportunity to ask questions and all were answered. The patient agreed with the plan and demonstrated an understanding of the instructions.   The patient was advised to call back or seek an in-person evaluation if the symptoms worsen or if the condition fails to improve as anticipated.  I provided 30 minutes of non-face-to-face time during this encounter.   [name], LCSW

## 2018-08-27 ENCOUNTER — Ambulatory Visit (INDEPENDENT_AMBULATORY_CARE_PROVIDER_SITE_OTHER): Payer: Medicare Other | Admitting: Family Medicine

## 2018-08-27 DIAGNOSIS — G894 Chronic pain syndrome: Secondary | ICD-10-CM

## 2018-08-27 DIAGNOSIS — N820 Vesicovaginal fistula: Secondary | ICD-10-CM

## 2018-08-27 DIAGNOSIS — Z9889 Other specified postprocedural states: Secondary | ICD-10-CM | POA: Diagnosis not present

## 2018-08-27 DIAGNOSIS — F1290 Cannabis use, unspecified, uncomplicated: Secondary | ICD-10-CM | POA: Diagnosis not present

## 2018-08-27 MED ORDER — HYDROCODONE-ACETAMINOPHEN 5-325 MG PO TABS: 1.0000 | ORAL_TABLET | Freq: Two times a day (BID) | ORAL | 0 refills | Status: DC | PRN

## 2018-08-27 NOTE — Progress Notes (Signed)
Virtual Visit via Telephone Note  I connected with Kathleen Palmer on 08/27/18 at 1:09pm  by telephone and verified that I am speaking with the correct person using two identifiers.   Pt location: at home  Physician location: in office, Vic Blackbird MD, [place]  On call- pt, husband, and physician I discussed the limitations, risks, security and privacy concerns of performing an evaluation and management service by telephone and the availability of in person appointments. I also discussed with the patient that there may be a patient responsible charge related to this service. The patient expressed understanding and agreed to proceed.   History of Present Illness: Pt called in has chronic pain, see previous notes, I stopped filling her narcotic after an intentional overdose in 2018. She states pain from her hernia and her chronic vaginal pain are severe, as well as her back pain, she is unable to walk. She has been following with her psychiatrist regulary, taking all her meds, she has been seeing follow up with specialist Afraid to go to pain clinic at this time OTC meds are not helping  Would like to restart her pain meds She still does occ smoke Marijuana    Observations/Objective: Unable to observe   Assessment and Plan:  Chronic pain- has had extensive reconstruction to her pelvis, has chronic back pain, has hernia, has neuropathy. She has been taking meds as prescribed and following with psychiatry/therapy regulary. No ER visits for pain meds Husband is dispensing her meds At this time in setting of COVID unable to get to pain clinic Discussed with pt, After reviewing her chart I will give her Norco at lower dose 5-325mg  BID only for next 3 months, but she must then establish with pain clinic unless for some reason our quarentine or Gov orders permits high risk patients from being out She agreed to this discuused she has to continue working on [place]  cessation Follow Up Instructions:    I discussed the assessment and treatment plan with the patient. The patient was provided an opportunity to ask questions and all were answered. The patient agreed with the plan and demonstrated an understanding of the instructions.   The patient was advised to call back or seek an in-person evaluation if the symptoms worsen or if the condition fails to improve as anticipated.  I provided  12 minutes of non-face-to-face time during this encounter. End time: 1:21pm   Vic Blackbird, MD

## 2018-08-29 ENCOUNTER — Encounter: Payer: Self-pay | Admitting: Family Medicine

## 2018-09-02 ENCOUNTER — Other Ambulatory Visit: Payer: Self-pay

## 2018-09-02 ENCOUNTER — Encounter (HOSPITAL_COMMUNITY): Payer: Self-pay | Admitting: Psychiatry

## 2018-09-02 ENCOUNTER — Ambulatory Visit (INDEPENDENT_AMBULATORY_CARE_PROVIDER_SITE_OTHER): Payer: Medicare Other | Admitting: Psychiatry

## 2018-09-02 DIAGNOSIS — F4310 Post-traumatic stress disorder, unspecified: Secondary | ICD-10-CM | POA: Diagnosis not present

## 2018-09-02 DIAGNOSIS — F332 Major depressive disorder, recurrent severe without psychotic features: Secondary | ICD-10-CM | POA: Diagnosis not present

## 2018-09-02 MED ORDER — TRAZODONE HCL 100 MG PO TABS: 200.0000 mg | ORAL_TABLET | Freq: Every evening | ORAL | 2 refills | Status: DC | PRN

## 2018-09-02 MED ORDER — ALPRAZOLAM 1 MG PO TABS: 1.0000 mg | ORAL_TABLET | Freq: Three times a day (TID) | ORAL | 2 refills | Status: DC | PRN

## 2018-09-02 MED ORDER — FLUOXETINE HCL 20 MG PO CAPS: 20.0000 mg | ORAL_CAPSULE | Freq: Three times a day (TID) | ORAL | 2 refills | Status: DC

## 2018-09-02 NOTE — Progress Notes (Signed)
Virtual Visit via Telephone Note  I connected with Kathleen Palmer on 09/02/18 at  1:20 PM EDT by telephone and verified that I am speaking with the correct person using two identifiers.   I discussed the limitations, risks, security and privacy concerns of performing an evaluation and management service by telephone and the availability of in person appointments. I also discussed with the patient that there may be a patient responsible charge related to this service. The patient expressed understanding and agreed to proceed.       I discussed the assessment and treatment plan with the patient. The patient was provided an opportunity to ask questions and all were answered. The patient agreed with the plan and demonstrated an understanding of the instructions.   The patient was advised to call back or seek an in-person evaluation if the symptoms worsen or if the condition fails to improve as anticipated.  I provided73minutes of non-face-to-face time during this encounter.   Levonne Spiller, MD  [facility] MD/PA/NP OP Progress Note  09/02/2018 1:32 PM Kathleen Palmer  MRN:  [id number]  Chief Complaint:  Chief Complaint    Depression; Anxiety; Follow-up     HPI: This patient is a 58 year old married white female who lives with her husband in [place].  She is on disability.  She has a history of depression anxiety and posttraumatic stress disorder.  The patient is assessed via telephone interview due to the coronavirus pandemic.  She was last seen 4 weeks ago.  She states over the last week or 2 she has been more depressed.  She is grieving over her sister who died last year.  She found some of her sisters possessions in her home and this made her feel worse all over again.  She has been having a lot more crying spells.  She is sleeping okay and her anxiety is under fair control.  She still having chronic pain from past abdominal surgeries and will be going to a pain clinic soon.  She asked if she can  increase the Prozac and I think this is a reasonable idea.  She denies suicidal ideation Visit Diagnosis:    ICD-10-CM   1. Major depressive disorder, recurrent episode, severe with anxious distress ([place]) F33.2   2. PTSD (post-traumatic stress disorder) F43.10     Past Psychiatric History: Several previous hospitalizations for depression and suicidal ideation, the last 1 about 6 months ago  Past Medical History:  Past Medical History:  Diagnosis Date  . Angiomyolipoma    Left kidney  . Anxiety   . Arthritis   . Attention to urostomy [facility])   . Chronic pain   . Depression   . Hepatitis C    ? Contracted through IVDA  . Panic 04/29/1999  . PTSD (post-traumatic stress disorder)   . Substance abuse ([place])    Remote history- cocaine, ETOH, Marijuana  . Thyroid disease     Past Surgical History:  Procedure Laterality Date  . ABDOMINAL HYSTERECTOMY    . ABDOMINAL SURGERY    . BIOPSY  01/27/2017   Procedure: BIOPSY;  Surgeon: Danie Binder, MD;  [facility];  Service: Endoscopy;;  gastric  . CHOLECYSTECTOMY    . COLONOSCOPY WITH PROPOFOL N/A 01/27/2017   Procedure: COLONOSCOPY WITH PROPOFOL;  Surgeon: Danie Binder, MD;  Location: [facility];  Service: Endoscopy;  Laterality: N/A;  12:00pm  . ESOPHAGOGASTRODUODENOSCOPY (EGD) WITH PROPOFOL N/A 01/27/2017   Procedure: ESOPHAGOGASTRODUODENOSCOPY (EGD) WITH PROPOFOL;  Surgeon:  Fields, Marga Melnick, MD;  Location: [facility];  Service: Endoscopy;  Laterality: N/A;  . HERNIA REPAIR     x2- abdomen  . ILEO LOOP CONDUIT    . 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 VAGINA RECONSTRUCTION SURGERY      Family Psychiatric History: see below  Family History:  Family History  Adopted: Yes  Problem Relation Age of Onset  . Depression Mother   . Depression Sister   . Anxiety disorder Sister   . Bipolar disorder  Sister   . Depression Maternal Grandmother   . ADD / ADHD Neg Hx   . Alcohol abuse Neg Hx   . Drug abuse Neg Hx   . Dementia Neg Hx   . OCD Neg Hx   . Paranoid behavior Neg Hx   . Schizophrenia Neg Hx   . Seizures Neg Hx   . Sexual abuse Neg Hx   . Physical abuse Neg Hx   . Suicidality Neg Hx   . Colon cancer Neg Hx     Social History:  Social History   Socioeconomic History  . Marital status: Married    Spouse name: Not on file  . Number of children: 0  . Years of education: Not on file  . Highest education level: Not on file  Occupational History  . Occupation: disability  Social Needs  . Financial resource strain: Not on file  . Food insecurity:    Worry: Not on file    Inability: Not on file  . Transportation needs:    Medical: Not on file    Non-medical: Not on file  Tobacco Use  . Smoking status: Former Smoker    Packs/day: 0.25    Years: 33.00    Pack years: 8.25    Types: Cigarettes    Last attempt to quit: 01/21/2015    Years since quitting: 3.6  . Smokeless tobacco: Never Used  . Tobacco comment: 9-10 cigs a day as of 10/20/2012, (02-07-15 per pt, she stopped smoking 01-21-15)  Substance and Sexual Activity  . Alcohol use: No    Alcohol/week: 0.0 standard drinks  . Drug use: Not Currently    Types: Marijuana    Comment: quit 10 days   . Sexual activity: Yes    Birth control/protection: Surgical  Lifestyle  . Physical activity:    Days per week: Not on file    Minutes per session: Not on file  . Stress: Not on file  Relationships  . Social connections:    Talks on phone: Not on file    Gets together: Not on file    Attends religious service: Not on file    Active member of club or organization: Not on file    Attends meetings of clubs or organizations: Not on file    Relationship status: Not on file  Other Topics Concern  . Not on file  Social History Narrative  . Not on file    Allergies:  Allergies  Allergen Reactions  . Oxycodone  Anxiety  . Abilify [Aripiprazole]     Stiff neck  . Bactrim [Sulfamethoxazole-Trimethoprim]     Tongue swelled  . Gabapentin     Dizziness to the point she actually fell  . Ampicillin Rash    Has patient had a PCN reaction causing immediate rash, facial/tongue/throat swelling, SOB or lightheadedness with hypotension: Unknown  Has patient had a PCN reaction causing severe rash involving mucus membranes or skin necrosis: Unknown Has patient had a PCN reaction that required hospitalization: No Has patient had a PCN reaction occurring within the last 10 years: No If all of the above answers are "NO", then may proceed with Cephalosporin use.      Metabolic Disorder Labs: Lab Results  Component Value Date   HGBA1C 5.2 03/10/2018   MPG 102.54 03/10/2018   MPG 102.54 12/05/2016   No results found for: PROLACTIN Lab Results  Component Value Date   CHOL 137 03/10/2018   TRIG 78 03/10/2018   HDL 37 (L) 03/10/2018   CHOLHDL 3.7 03/10/2018   VLDL 16 03/10/2018   LDLCALC 84 03/10/2018   LDLCALC 70 12/05/2016   Lab Results  Component Value Date   TSH 14.938 (H) 03/10/2018   TSH 4.65 (H) 06/09/2017    Therapeutic Level Labs: No results found for: LITHIUM No results found for: VALPROATE No components found for:  CBMZ  Current Medications: Current Outpatient Medications  Medication Sig Dispense Refill  . Acetaminophen-Caffeine (EXCEDRIN ASPIRIN FREE PO) Take 2 tablets by mouth daily as needed.    . ALPRAZolam (XANAX) 1 MG tablet Take 1 tablet (1 mg total) by mouth 3 (three) times daily as needed for anxiety. 90 tablet 2  . dicyclomine (BENTYL) 20 MG tablet Take 1 tablet (20 mg total) by mouth 2 (two) times daily. 20 tablet 0  . FLUoxetine (PROZAC) 20 MG capsule Take 1 capsule (20 mg total) by mouth 3 (three) times daily. 90 capsule 2  . HYDROcodone-acetaminophen (NORCO) 5-325 MG tablet Take 1 tablet by mouth 2 (two) times daily as needed for moderate pain. 60 tablet 0  . lactulose  (CHRONULAC) 10 GM/15ML solution Take 30 mLs (20 g total) by mouth 2 (two) times daily as needed for mild constipation. 1892 mL 3  . Multiple Vitamins-Minerals (CENTRUM PO) Take 1 tablet by mouth every evening.    . traZODone (DESYREL) 100 MG tablet Take 2 tablets (200 mg total) by mouth at bedtime as needed for sleep. 60 tablet 2   No current facility-administered medications for this visit.      Musculoskeletal: Strength & Muscle Tone: within normal limits Gait & Station: normal Patient leans: N/A  Psychiatric Specialty Exam: Review of Systems  Gastrointestinal: Positive for abdominal pain.  Psychiatric/Behavioral: Positive for depression.  All other systems reviewed and are negative.   There were no vitals taken for this visit.There is no height or weight on file to calculate BMI.  General Appearance: NA  Eye Contact:  NA  Speech:  Clear and Coherent  Volume:  Decreased  Mood:  Depressed  Affect:  NA  Thought Process:  Goal Directed  Orientation:  Full (Time, Place, and Person)  Thought Content: Rumination   Suicidal Thoughts:  No  Homicidal Thoughts:  No  Memory:  Immediate;   Good Recent;   Good Remote;   Good  Judgement:  Fair  Insight:  Fair  Psychomotor Activity:  Decreased  Concentration:  Concentration: Fair and Attention Span: Fair  Recall:  Good  Fund of Knowledge: Good  Language: Good  Akathisia:  No  Handed:  Right  AIMS (if indicated): not done  Assets:  Communication Skills Desire for Improvement Resilience Social Support Talents/Skills  ADL's:  Intact  Cognition: normal  Sleep:  Good   Screenings: AIMS     Admission (Discharged) from OP Visit from 03/10/2018 in [place]  [facility] Admission (Discharged) from 12/03/2016 in [place] [facility]  AIMS Total Score  0  0    AUDIT     Admission (Discharged) from OP Visit from 03/10/2018 in [place] [facility] Admission  (Discharged) from 12/03/2016 in [place] [facility]  Alcohol Use Disorder Identification Test Final Score (AUDIT)  0  0    MDI     Office Visit from 11/15/2015 in [place] [facility]  Total Score (max 50)  24    PHQ2-9     Office Visit from 07/07/2018 in [place] Office Visit from 10/27/2017 in [place] Office Visit from 01/19/2017 in [place] Office Visit from 12/10/2016 in [place] Office Visit from 10/17/2016 in [place]  PHQ-2 Total Score  0  0  4  4  2   PHQ-9 Total Score  -  -  17  15  8        Assessment and Plan: This patient is a 58 year old female with a history of depression anxiety and posttraumatic stress disorder. She seems to be getting more depressed regarding her sister's death so we will increase Prozac to 60 mg daily for depression, continue trazodone 200 mg at bedtime for sleep and Xanax 1 mg 3 times daily as needed for anxiety.  She will return to see me in 6 weeks  Levonne Spiller, MD 09/02/2018, 1:32 PM

## 2018-09-09 ENCOUNTER — Other Ambulatory Visit: Payer: Self-pay

## 2018-09-09 ENCOUNTER — Encounter (HOSPITAL_COMMUNITY): Payer: Self-pay | Admitting: Licensed Clinical Social Worker

## 2018-09-09 ENCOUNTER — Ambulatory Visit (INDEPENDENT_AMBULATORY_CARE_PROVIDER_SITE_OTHER): Payer: Medicare Other | Admitting: Licensed Clinical Social Worker

## 2018-09-09 DIAGNOSIS — F332 Major depressive disorder, recurrent severe without psychotic features: Secondary | ICD-10-CM | POA: Diagnosis not present
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
## 2018-09-16 ENCOUNTER — Ambulatory Visit (HOSPITAL_COMMUNITY): Payer: Medicare Other | Admitting: Licensed Clinical Social Worker

## 2018-09-23 ENCOUNTER — Ambulatory Visit (INDEPENDENT_AMBULATORY_CARE_PROVIDER_SITE_OTHER): Payer: Medicare Other | Admitting: Licensed Clinical Social Worker

## 2018-09-23 ENCOUNTER — Other Ambulatory Visit: Payer: Self-pay

## 2018-09-23 ENCOUNTER — Encounter (HOSPITAL_COMMUNITY): Payer: Self-pay | Admitting: Licensed Clinical Social Worker

## 2018-09-23 DIAGNOSIS — F332 Major depressive disorder, recurrent severe without psychotic features: Secondary | ICD-10-CM | POA: Diagnosis not present

## 2018-09-23 NOTE — Progress Notes (Signed)
Virtual Visit via Telephone Note  I connected with [name] on 09/23/18 at 10:00 AM EDT by telephone and verified that I am speaking with the correct person using two identifiers.   I discussed the limitations, risks, security and privacy concerns of performing an evaluation and management service by telephone and the availability of in person appointments. I also discussed with the patient that there may be a patient responsible charge related to this service. The patient expressed understanding and agreed to proceed.   History of Present Illness: Major Depressive disorder, recurrent episode, severe with anxious distress.  Kathleen Palmer presents oriented x4 (person, place, situation, and object), distracted, casually groomed, causually dressed and cooperative to address depression. Patient has a history of medical issues and mental health treatment. She denies symptoms of mania. Patient denies suicidal and homicidal ideations. She denies psychosis including auditory and visual hallucinations. Patient admits to cannabis use. She is at low risk for lethality. Patient has been hospitalized 7 times due to suicidal ideations but states she has never attempted due to her religious faith.    Observations/Objective: Physical: Patient continues to have physical pain. She is managing a little better. She had a "slip up" and drank 5 beers the other weekend. She also "slipped up" and smoked cannabis 2 times. She is stopping her cannabis use due to how it impacts her mood and prevents her from getting pain management.  Spiritual/value: No issues identified. Relationships: Patient reported that things are better with her husband. He is not drinking as much and is not being mean to her.  Emotional/Mental/Behavior: Patient is feeling a little better mentally. She describes it as "crawling out of the hole." She is trying to read her Bible, check in with her sponsor, she is planning on going for walks again, and taking  things one day at a time.   Patient engaged in session. She responded well to interventions. Patient continues to meet criteria for Major Depressive disorder, recurrent episode, severe with anxious distress. Patient will continue in outpatient therapy due to being the least restrictive service to meet her needs. Patient made moderate progress on her goals at this time.   Assessment and Plan: Therapist reviewed patient's recent thoughts and behaviors. Therapist utilized CBT to address depression. Therapist reviewed patient's goals.  Therapist processed patient's feelings to identify triggers for mood. Therapist discussed with patient taking things day by day and focusing on what she can control.   Suicidal/Homicidal: Negativewithout intent/plan  Follow Up Instructions: Return again in 4 weeks.   I discussed the assessment and treatment plan with the patient. The patient was provided an opportunity to ask questions and all were answered. The patient agreed with the plan and demonstrated an understanding of the instructions.   The patient was advised to call back or seek an in-person evaluation if the symptoms worsen or if the condition fails to improve as anticipated.  I provided 40 minutes of non-face-to-face time during this encounter.   [name], LCSW

## 2018-09-27 ENCOUNTER — Ambulatory Visit (INDEPENDENT_AMBULATORY_CARE_PROVIDER_SITE_OTHER): Payer: Medicare Other | Admitting: Family Medicine

## 2018-09-27 ENCOUNTER — Other Ambulatory Visit: Payer: Self-pay

## 2018-09-27 DIAGNOSIS — G894 Chronic pain syndrome: Secondary | ICD-10-CM

## 2018-09-27 MED ORDER — HYDROCODONE-ACETAMINOPHEN 5-325 MG PO TABS: 1.0000 | ORAL_TABLET | Freq: Two times a day (BID) | ORAL | 0 refills | Status: DC | PRN

## 2018-09-27 NOTE — Progress Notes (Signed)
Virtual Visit via Telephone Note  I connected with Kathleen Palmer on 09/27/18 at 11:40am   by telephone and verified that I am speaking with the correct person using two identifiers.      Pt location: at home   Physician location:  In office, [facility], Vic Blackbird MD     On call: patient and physician   I discussed the limitations, risks, security and privacy concerns of performing an evaluation and management service by telephone and the availability of in person appointments. I also discussed with the patient that there may be a patient responsible charge related to this service. The patient expressed understanding and agreed to proceed.   History of Present Illness:  Taking excedrin with pain pill in morning and evening for pain  - acetaminophen- 225mg   Still has daily pain of course but states that norco has helped  Still on xanax tid, prozac 20mg  three times a day  No new concerns Husband is dispensing her meds   Observations/Objective: NAD noted, normal speech on phone, answers questions without difficulty  Assessment and Plan: Chronic pain, continue norco 1 tab BID, D/C excedrin with overuse of caffiene/ASA , instead take acetaminophen 650mg    Follow Up Instructions:  F/U 2 months for CPE     I discussed the assessment and treatment plan with the patient. The patient was provided an opportunity to ask questions and all were answered. The patient agreed with the plan and demonstrated an understanding of the instructions.   The patient was advised to call back or seek an in-person evaluation if the symptoms worsen or if the condition fails to improve as anticipated.  I provided 11 minutes of non-face-to-face time during this encounter. End Time: 11:51am   Vic Blackbird, MD

## 2018-09-28 ENCOUNTER — Encounter: Payer: Self-pay | Admitting: Family Medicine

## 2018-10-06 ENCOUNTER — Other Ambulatory Visit: Payer: Self-pay

## 2018-10-06 ENCOUNTER — Ambulatory Visit (HOSPITAL_COMMUNITY): Payer: Medicare Other | Admitting: Licensed Clinical Social Worker

## 2018-10-13 ENCOUNTER — Ambulatory Visit: Payer: Medicare Other | Admitting: Nurse Practitioner

## 2018-10-15 ENCOUNTER — Ambulatory Visit: Payer: Medicare Other | Admitting: Gastroenterology

## 2018-10-18 ENCOUNTER — Ambulatory Visit (HOSPITAL_COMMUNITY): Payer: Medicare Other | Admitting: Psychiatry

## 2018-10-19 ENCOUNTER — Other Ambulatory Visit: Payer: Self-pay

## 2018-10-19 ENCOUNTER — Ambulatory Visit (INDEPENDENT_AMBULATORY_CARE_PROVIDER_SITE_OTHER): Payer: Medicare Other | Admitting: Psychiatry

## 2018-10-19 ENCOUNTER — Encounter (HOSPITAL_COMMUNITY): Payer: Self-pay | Admitting: Psychiatry

## 2018-10-19 DIAGNOSIS — F332 Major depressive disorder, recurrent severe without psychotic features: Secondary | ICD-10-CM

## 2018-10-19 DIAGNOSIS — F4310 Post-traumatic stress disorder, unspecified: Secondary | ICD-10-CM | POA: Diagnosis not present

## 2018-10-19 MED ORDER — FLUOXETINE HCL 20 MG PO CAPS: 20.0000 mg | ORAL_CAPSULE | Freq: Three times a day (TID) | ORAL | 2 refills | Status: DC

## 2018-10-19 MED ORDER — ALPRAZOLAM 1 MG PO TABS: 1.0000 mg | ORAL_TABLET | Freq: Three times a day (TID) | ORAL | 2 refills | Status: DC | PRN

## 2018-10-19 MED ORDER — TRAZODONE HCL 100 MG PO TABS: 200.0000 mg | ORAL_TABLET | Freq: Every evening | ORAL | 2 refills | Status: DC | PRN

## 2018-10-19 MED ORDER — RISPERIDONE 0.5 MG PO TABS: 0.5000 mg | ORAL_TABLET | Freq: Every day | ORAL | 2 refills | Status: DC

## 2018-10-19 NOTE — Progress Notes (Signed)
Virtual Visit via Telephone Note  I connected with Kathleen Palmer on 10/19/18 at 10:20 AM EDT by telephone and verified that I am speaking with the correct person using two identifiers.   I discussed the limitations, risks, security and privacy concerns of performing an evaluation and management service by telephone and the availability of in person appointments. I also discussed with the patient that there may be a patient responsible charge related to this service. The patient expressed understanding and agreed to proceed.    I discussed the assessment and treatment plan with the patient. The patient was provided an opportunity to ask questions and all were answered. The patient agreed with the plan and demonstrated an understanding of the instructions.   The patient was advised to call back or seek an in-person evaluation if the symptoms worsen or if the condition fails to improve as anticipated.  I provided 15 minutes of non-face-to-face time during this encounter.   Kathleen Spiller, MD  [facility] MD/PA/NP OP Progress Note  10/19/2018 10:53 AM Kathleen Palmer  MRN:  [id number]  Chief Complaint:  Chief Complaint    Anxiety; Depression; Follow-up     HPI: This patient is a 58 year old married white female who lives with her husband in [place].  She is on disability.  She has a history of depression anxiety and posttraumatic stress disorder.  The patient is assessed via telephone interview due to the coronavirus pandemic.  She was last seen 6 weeks ago.  She states that she has been more stressed lately her mind is racing her moods are shifting rapidly.  She is upset because she saw a GI physician about 3 months ago and they talk to her about having her hernia surgery which would mean having a temporary colostomy bag.  This upset her greatly and she does not want to go through with it.  She also needs to get treatment for hepatitis C before any of this can happen and she is going back to the  physician in July to discuss this.  All of this has gotten her worried.  In the past she took Seroquel for mood stabilization but then it "stopped working."  I suggested that she take a low-dose of Risperdal at bedtime to help with her sleep and agitation.  She is not sleeping that well right now.  She stated that she is been quite anxious although her medications are still helping with the depression.  She denies suicidal ideation or plan.  She tends to get herself worked up is easily. Visit Diagnosis:    ICD-10-CM   1. Major depressive disorder, recurrent episode, severe with anxious distress ([place])  F33.2   2. PTSD (post-traumatic stress disorder)  F43.10     Past Psychiatric History: Several previous hospitalizations for depression and suicidal ideation, the last one being last year  Past Medical History:  Past Medical History:  Diagnosis Date  . Angiomyolipoma    Left kidney  . Anxiety   . Arthritis   . Attention to urostomy [facility])   . Chronic pain   . Depression   . Hepatitis C    ? Contracted through IVDA  . Panic 04/29/1999  . PTSD (post-traumatic stress disorder)   . Substance abuse ([place])    Remote history- cocaine, ETOH, Marijuana  . Thyroid disease     Past Surgical History:  Procedure Laterality Date  . ABDOMINAL HYSTERECTOMY    . ABDOMINAL SURGERY    . BIOPSY  01/27/2017  Procedure: BIOPSY;  Surgeon: Danie Binder, MD;  [facility];  Service: Endoscopy;;  gastric  . CHOLECYSTECTOMY    . COLONOSCOPY WITH PROPOFOL N/A 01/27/2017   Procedure: COLONOSCOPY WITH PROPOFOL;  Surgeon: Danie Binder, MD;  Location: [facility];  Service: Endoscopy;  Laterality: N/A;  12:00pm  . ESOPHAGOGASTRODUODENOSCOPY (EGD) WITH PROPOFOL N/A 01/27/2017   Procedure: ESOPHAGOGASTRODUODENOSCOPY (EGD) WITH PROPOFOL;  Surgeon: Danie Binder, MD;  [facility]: [facility];  Service: Endoscopy;  Laterality: N/A;  . HERNIA REPAIR     x2- abdomen  . ILEO LOOP CONDUIT    . 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 VAGINA RECONSTRUCTION SURGERY      Family Psychiatric History: See below  Family History:  Family History  Adopted: Yes  Problem Relation Age of Onset  . Depression Mother   . Depression Sister   . Anxiety disorder Sister   . Bipolar disorder Sister   . Depression Maternal Grandmother   . ADD / ADHD Neg Hx   . Alcohol abuse Neg Hx   . Drug abuse Neg Hx   . Dementia Neg Hx   . OCD Neg Hx   . Paranoid behavior Neg Hx   . Schizophrenia Neg Hx   . Seizures Neg Hx   . Sexual abuse Neg Hx   . Physical abuse Neg Hx   . Suicidality Neg Hx   . Colon cancer Neg Hx     Social History:  Social History   Socioeconomic History  . Marital status: Married    Spouse name: Not on file  . Number of children: 0  . Years of education: Not on file  . Highest education level: Not on file  Occupational History  . Occupation: disability  Social Needs  . Financial resource strain: Not on file  . Food insecurity    Worry: Not on file    Inability: Not on file  . Transportation needs    Medical: Not on file    Non-medical: Not on file  Tobacco Use  . Smoking status: Former Smoker    Packs/day: 0.25    Years: 33.00    Pack years: 8.25    Types: Cigarettes    Quit date: 01/21/2015    Years since quitting: 3.7  . Smokeless tobacco: Never Used  . Tobacco comment: 9-10 cigs a day as of 10/20/2012, (02-07-15 per pt, she stopped smoking 01-21-15)  Substance and Sexual Activity  . Alcohol use: No    Alcohol/week: 0.0 standard drinks  . Drug use: Not Currently    Types: Marijuana    Comment: quit 10 days   . Sexual activity: Yes    Birth control/protection: Surgical  Lifestyle  . Physical activity    Days per week: Not on file    Minutes per session: Not on file  . Stress: Not on file  Relationships  . Social Herbalist on  phone: Not on file    Gets together: Not on file    Attends religious service: Not on file    Active member of club or organization: Not on file    Attends meetings of clubs or organizations: Not on file    Relationship status: Not on file  Other Topics Concern  . Not on file  Social History Narrative  . Not on file  Allergies:  Allergies  Allergen Reactions  . Oxycodone Anxiety  . Abilify [Aripiprazole]     Stiff neck  . Bactrim [Sulfamethoxazole-Trimethoprim]     Tongue swelled  . Gabapentin     Dizziness to the point she actually fell  . Ampicillin Rash    Has patient had a PCN reaction causing immediate rash, facial/tongue/throat swelling, SOB or lightheadedness with hypotension: Unknown Has patient had a PCN reaction causing severe rash involving mucus membranes or skin necrosis: Unknown Has patient had a PCN reaction that required hospitalization: No Has patient had a PCN reaction occurring within the last 10 years: No If all of the above answers are "NO", then may proceed with Cephalosporin use.      Metabolic Disorder Labs: Lab Results  Component Value Date   HGBA1C 5.2 03/10/2018   MPG 102.54 03/10/2018   MPG 102.54 12/05/2016   No results found for: PROLACTIN Lab Results  Component Value Date   CHOL 137 03/10/2018   TRIG 78 03/10/2018   HDL 37 (L) 03/10/2018   CHOLHDL 3.7 03/10/2018   VLDL 16 03/10/2018   LDLCALC 84 03/10/2018   LDLCALC 70 12/05/2016   Lab Results  Component Value Date   TSH 14.938 (H) 03/10/2018   TSH 4.65 (H) 06/09/2017    Therapeutic Level Labs: No results found for: LITHIUM No results found for: VALPROATE No components found for:  CBMZ  Current Medications: Current Outpatient Medications  Medication Sig Dispense Refill  . Acetaminophen-Caffeine (EXCEDRIN ASPIRIN FREE PO) Take 2 tablets by mouth daily as needed.    . ALPRAZolam (XANAX) 1 MG tablet Take 1 tablet (1 mg total) by mouth 3 (three) times daily as needed for  anxiety. 90 tablet 2  . dicyclomine (BENTYL) 20 MG tablet Take 1 tablet (20 mg total) by mouth 2 (two) times daily. 20 tablet 0  . FLUoxetine (PROZAC) 20 MG capsule Take 1 capsule (20 mg total) by mouth 3 (three) times daily. 90 capsule 2  . HYDROcodone-acetaminophen (NORCO) 5-325 MG tablet Take 1 tablet by mouth 2 (two) times daily as needed for moderate pain. 60 tablet 0  . lactulose (CHRONULAC) 10 GM/15ML solution Take 30 mLs (20 g total) by mouth 2 (two) times daily as needed for mild constipation. 1892 mL 3  . Multiple Vitamins-Minerals (CENTRUM PO) Take 1 tablet by mouth every evening.    . risperiDONE (RISPERDAL) 0.5 MG tablet Take 1 tablet (0.5 mg total) by mouth at bedtime. 30 tablet 2  . traZODone (DESYREL) 100 MG tablet Take 2 tablets (200 mg total) by mouth at bedtime as needed for sleep. 60 tablet 2   No current facility-administered medications for this visit.      Musculoskeletal: Strength & Muscle Tone: within normal limits Gait & Station: normal Patient leans: N/A  Psychiatric Specialty Exam: Review of Systems  Gastrointestinal: Positive for abdominal pain.  Psychiatric/Behavioral: The patient is nervous/anxious and has insomnia.   All other systems reviewed and are negative.   There were no vitals taken for this visit.There is no height or weight on file to calculate BMI.  General Appearance: NA  Eye Contact:  NA  Speech:  Clear and Coherent  Volume:  Normal  Mood:  Anxious  Affect:  NA  Thought Process:  Goal Directed  Orientation:  Full (Time, Place, and Person)  Thought Content: Rumination   Suicidal Thoughts:  No  Homicidal Thoughts:  No  Memory:  Immediate;   Good Recent;   Good Remote;  Fair  Judgement:  Fair  Insight:  Fair  Psychomotor Activity:  Decreased  Concentration:  Concentration: Fair and Attention Span: Fair  Recall:  Good  Fund of Knowledge: Good  Language: Good  Akathisia:  No  Handed:right  AIMS (if indicated): not done  Assets:   Communication Skills Desire for Improvement Resilience Social Support Talents/Skills  ADL's:  Intact  Cognition: WNL  Sleep:  Poor   Screenings: AIMS     Admission (Discharged) from OP Visit from 03/10/2018 in [place] [facility] Admission (Discharged) from 12/03/2016 in [place] [facility]  AIMS Total Score  0  0    AUDIT     Admission (Discharged) from OP Visit from 03/10/2018 in [place] [facility] Admission (Discharged) from 12/03/2016 in [place] [facility]  Alcohol Use Disorder Identification Test Final Score (AUDIT)  0  0    MDI     Office Visit from 11/15/2015 in [facility]  Total Score (max 50)  24    PHQ2-9     Office Visit from 07/07/2018 in [place] Office Visit from 10/27/2017 in [place] Office Visit from 01/19/2017 in [place] Office Visit from 12/10/2016 in [place] Office Visit from 10/17/2016 in [place]  PHQ-2 Total Score  0  0  4  4  2   PHQ-9 Total Score  -  -  17  15  8        Assessment and Plan: This patient is a 58 year old female with a history of PTSD depression and anxiety.  She gets easily thrown off by various stressors.  Right now she is upset about the treatment for hepatitis C and/or the hernia surgery possibility.  Because she is having racing thoughts and difficulty sleeping we will add Risperdal 0.5 mg at bedtime.  She will continue trazodone 200 mg at bedtime for sleep, Prozac 20 mg 3 times daily for depression and Xanax 1 mg 3 times daily as needed for anxiety.  She will return to see me in 3 weeks   Kathleen Spiller, MD 10/19/2018, 10:53 AM

## 2018-10-20 ENCOUNTER — Other Ambulatory Visit: Payer: Self-pay

## 2018-10-20 ENCOUNTER — Encounter (HOSPITAL_COMMUNITY): Payer: Self-pay | Admitting: Licensed Clinical Social Worker

## 2018-10-20 ENCOUNTER — Ambulatory Visit (INDEPENDENT_AMBULATORY_CARE_PROVIDER_SITE_OTHER): Payer: Medicare Other | Admitting: Licensed Clinical Social Worker

## 2018-10-20 DIAGNOSIS — F332 Major depressive disorder, recurrent severe without psychotic features: Secondary | ICD-10-CM

## 2018-10-20 NOTE — Progress Notes (Signed)
Virtual Visit via Telephone Note  I connected with Kathleen Palmer on 10/20/18 at 11:00 AM EDT by telephone and verified that I am speaking with the correct person using two identifiers.   I discussed the limitations, risks, security and privacy concerns of performing an evaluation and management service by telephone and the availability of in person appointments. I also discussed with the patient that there may be a patient responsible charge related to this service. The patient expressed understanding and agreed to proceed.   History of Present Illness: Major Depressive disorder, recurrent episode, severe with anxious distress.  Kathleen Palmer presents oriented x4 (person, place, situation, and object), distracted, casually groomed, causually dressed and cooperative to address depression. Patient has a history of medical issues and mental health treatment. She denies symptoms of mania. Patient denies suicidal and homicidal ideations. She denies psychosis including auditory and visual hallucinations. Patient admits to cannabis use. She is at low risk for lethality. Patient has been hospitalized 7 times due to suicidal ideations but states she has never attempted due to her religious faith.    Observations/Objective: Physical: Patient is struggling with her physical health. She has several surgeries coming that she is worried about.  Spiritual/value: No issues identified. Relationships: Patient is still feeling supported by her husband.  Emotional/Mental/Behavior: Patient is feeling depressed. She had passive SI but denies plans or means. Patient has racing thoughts. She feels overwhelmed. After discussion, patient was able to identify her concern over her surgeries that have triggered her mood. Patient was able to use self talk to calm herself down. She understood that she needs to take it one day at a time and focus on addressing one medical need at a time.   Patient engaged in session. She responded well to  interventions. Patient continues to meet criteria for Major Depressive disorder, recurrent episode, severe with anxious distress. Patient will continue in outpatient therapy due to being the least restrictive service to meet her needs. Patient made moderate progress on her goals at this time.   Assessment and Plan: Therapist reviewed patient's recent thoughts and behaviors. Therapist utilized CBT to address depression. Therapist reviewed patient's goals.  Therapist processed patient's feelings to identify triggers for mood. Therapist discussed with patient taking things day by day and not getting overwhelmed with her medical conditions.   Suicidal/Homicidal: Negativewithout intent/plan  Follow Up Instructions: Return again in 4 weeks.   I discussed the assessment and treatment plan with the patient. The patient was provided an opportunity to ask questions and all were answered. The patient agreed with the plan and demonstrated an understanding of the instructions.   The patient was advised to call back or seek an in-person evaluation if the symptoms worsen or if the condition fails to improve as anticipated.  I provided 40 minutes of non-face-to-face time during this encounter.   [name], LCSW

## 2018-10-25 ENCOUNTER — Other Ambulatory Visit: Payer: Self-pay | Admitting: Family Medicine

## 2018-10-25 ENCOUNTER — Ambulatory Visit: Payer: Medicare Other | Admitting: Gastroenterology

## 2018-10-25 MED ORDER — HYDROCODONE-ACETAMINOPHEN 5-325 MG PO TABS: 1.0000 | ORAL_TABLET | Freq: Two times a day (BID) | ORAL | 0 refills | Status: DC | PRN

## 2018-10-25 NOTE — Telephone Encounter (Signed)
Ok to refill??  Last office visit/ refill 09/27/2018.

## 2018-10-25 NOTE — Telephone Encounter (Signed)
[phone]  [facility] in [place] she is requesting a refill on her hydrocodone.

## 2018-10-27 ENCOUNTER — Ambulatory Visit: Payer: Medicare Other | Admitting: Gastroenterology

## 2018-10-28 DIAGNOSIS — B182 Chronic viral hepatitis C: Secondary | ICD-10-CM | POA: Diagnosis not present

## 2018-10-28 DIAGNOSIS — K7469 Other cirrhosis of liver: Secondary | ICD-10-CM | POA: Diagnosis not present

## 2018-10-28 DIAGNOSIS — K7290 Hepatic failure, unspecified without coma: Secondary | ICD-10-CM | POA: Diagnosis not present

## 2018-11-10 ENCOUNTER — Other Ambulatory Visit: Payer: Self-pay

## 2018-11-10 ENCOUNTER — Ambulatory Visit (HOSPITAL_COMMUNITY): Payer: Medicare Other | Admitting: Licensed Clinical Social Worker

## 2018-11-11 ENCOUNTER — Encounter (HOSPITAL_COMMUNITY): Payer: Self-pay | Admitting: Psychiatry

## 2018-11-11 ENCOUNTER — Other Ambulatory Visit: Payer: Self-pay

## 2018-11-11 ENCOUNTER — Ambulatory Visit (INDEPENDENT_AMBULATORY_CARE_PROVIDER_SITE_OTHER): Payer: Medicare Other | Admitting: Psychiatry

## 2018-11-11 DIAGNOSIS — F4310 Post-traumatic stress disorder, unspecified: Secondary | ICD-10-CM | POA: Diagnosis not present

## 2018-11-11 DIAGNOSIS — F332 Major depressive disorder, recurrent severe without psychotic features: Secondary | ICD-10-CM | POA: Diagnosis not present

## 2018-11-11 MED ORDER — ALPRAZOLAM 1 MG PO TABS: 1.0000 mg | ORAL_TABLET | Freq: Three times a day (TID) | ORAL | 2 refills | Status: DC | PRN

## 2018-11-11 MED ORDER — TRAZODONE HCL 100 MG PO TABS: 200.0000 mg | ORAL_TABLET | Freq: Every evening | ORAL | 2 refills | Status: DC | PRN

## 2018-11-11 MED ORDER — FLUOXETINE HCL 20 MG PO CAPS: 20.0000 mg | ORAL_CAPSULE | Freq: Three times a day (TID) | ORAL | 2 refills | Status: DC

## 2018-11-11 MED ORDER — RISPERIDONE 0.5 MG PO TABS: 0.5000 mg | ORAL_TABLET | Freq: Every day | ORAL | 2 refills | Status: DC

## 2018-11-11 NOTE — Progress Notes (Signed)
Virtual Visit via Telephone Note  I connected with Kathleen Palmer on 11/11/18 at  2:40 PM EDT by telephone and verified that I am speaking with the correct person using two identifiers.   I discussed the limitations, risks, security and privacy concerns of performing an evaluation and management service by telephone and the availability of in person appointments. I also discussed with the patient that there may be a patient responsible charge related to this service. The patient expressed understanding and agreed to proceed.     I discussed the assessment and treatment plan with the patient. The patient was provided an opportunity to ask questions and all were answered. The patient agreed with the plan and demonstrated an understanding of the instructions.   The patient was advised to call back or seek an in-person evaluation if the symptoms worsen or if the condition fails to improve as anticipated.  I provided 15 minutes of non-face-to-face time during this encounter.   Levonne Spiller, MD  [facility] MD/PA/NP OP Progress Note  11/11/2018 2:53 PM Kathleen Palmer  MRN:  [id number]  Chief Complaint:  Chief Complaint    Depression; Anxiety; Follow-up     HPI: This patient is a 58 year old married white female who lives with her husband in [place].  She is on disability.  She has a history of depression anxiety and posttraumatic stress disorder.  The patient returns after 4 weeks for follow-up regarding depression.  Last time she states she was very anxious and having racing thoughts.  We added Risperdal at bedtime which seems to be helping somewhat.  She is very worried about her health issues.  She is supposed to have possible hernia surgery but her GI specialist wants her to go through treatment for hepatitis C first which makes sense.  She states that friends of her sisters scattered her sisters ashes in [place] a few days ago and this upset her greatly of although she knew this was her sister's  wishes.  She did not feel up to being there with them.  Right now the patient is doing okay she is having a lot of abdominal and pelvic pain today and has been resting a lot.  She denies any thoughts of self-harm and states that her mood has been fairly stable. Visit Diagnosis:    ICD-10-CM   1. Major depressive disorder, recurrent episode, severe with anxious distress ([place])  F33.2   2. PTSD (post-traumatic stress disorder)  F43.10     Past Psychiatric History: Several previous hospitalizations for depression and suicidal ideation, the last one being last year  Past Medical History:  Past Medical History:  Diagnosis Date  . Angiomyolipoma    Left kidney  . Anxiety   . Arthritis   . Attention to urostomy [facility])   . Chronic pain   . Depression   . Hepatitis C    ? Contracted through IVDA  . Panic 04/29/1999  . PTSD (post-traumatic stress disorder)   . Substance abuse ([place])    Remote history- cocaine, ETOH, Marijuana  . Thyroid disease     Past Surgical History:  Procedure Laterality Date  . ABDOMINAL HYSTERECTOMY    . ABDOMINAL SURGERY    . BIOPSY  01/27/2017   Procedure: BIOPSY;  Surgeon: Danie Binder, MD;  [facility];  Service: Endoscopy;;  gastric  . CHOLECYSTECTOMY    . COLONOSCOPY WITH PROPOFOL N/A 01/27/2017   Procedure: COLONOSCOPY WITH PROPOFOL;  Surgeon: Danie Binder, MD;  [facility]  [facility];  Service: Endoscopy;  Laterality: N/A;  12:00pm  . ESOPHAGOGASTRODUODENOSCOPY (EGD) WITH PROPOFOL N/A 01/27/2017   Procedure: ESOPHAGOGASTRODUODENOSCOPY (EGD) WITH PROPOFOL;  Surgeon: Danie Binder, MD;  [facility];  Service: Endoscopy;  Laterality: N/A;  . HERNIA REPAIR     x2- abdomen  . ILEO LOOP CONDUIT    . 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 VAGINA RECONSTRUCTION SURGERY      Family Psychiatric History: See  below  Family History:  Family History  Adopted: Yes  Problem Relation Age of Onset  . Depression Mother   . Depression Sister   . Anxiety disorder Sister   . Bipolar disorder Sister   . Depression Maternal Grandmother   . ADD / ADHD Neg Hx   . Alcohol abuse Neg Hx   . Drug abuse Neg Hx   . Dementia Neg Hx   . OCD Neg Hx   . Paranoid behavior Neg Hx   . Schizophrenia Neg Hx   . Seizures Neg Hx   . Sexual abuse Neg Hx   . Physical abuse Neg Hx   . Suicidality Neg Hx   . Colon cancer Neg Hx     Social History:  Social History   Socioeconomic History  . Marital status: Married    Spouse name: Not on file  . Number of children: 0  . Years of education: Not on file  . Highest education level: Not on file  Occupational History  . Occupation: disability  Social Needs  . Financial resource strain: Not on file  . Food insecurity    Worry: Not on file    Inability: Not on file  . Transportation needs    Medical: Not on file    Non-medical: Not on file  Tobacco Use  . Smoking status: Former Smoker    Packs/day: 0.25    Years: 33.00    Pack years: 8.25    Types: Cigarettes    Quit date: 01/21/2015    Years since quitting: 3.8  . Smokeless tobacco: Never Used  . Tobacco comment: 9-10 cigs a day as of 10/20/2012, (02-07-15 per pt, she stopped smoking 01-21-15)  Substance and Sexual Activity  . Alcohol use: No    Alcohol/week: 0.0 standard drinks  . Drug use: Not Currently    Types: Marijuana    Comment: quit 10 days   . Sexual activity: Yes    Birth control/protection: Surgical  Lifestyle  . Physical activity    Days per week: Not on file    Minutes per session: Not on file  . Stress: Not on file  Relationships  . Social Herbalist on phone: Not on file    Gets together: Not on file    Attends religious service: Not on file    Active member of club or organization: Not on file    Attends meetings of clubs or organizations: Not on file    Relationship  status: Not on file  Other Topics Concern  . Not on file  Social History Narrative  . Not on file    Allergies:  Allergies  Allergen Reactions  . Oxycodone Anxiety  . Abilify [Aripiprazole]     Stiff neck  . Bactrim [Sulfamethoxazole-Trimethoprim]     Tongue swelled  . Gabapentin     Dizziness to the point she  actually fell  . Ampicillin Rash    Has patient had a PCN reaction causing immediate rash, facial/tongue/throat swelling, SOB or lightheadedness with hypotension: Unknown Has patient had a PCN reaction causing severe rash involving mucus membranes or skin necrosis: Unknown Has patient had a PCN reaction that required hospitalization: No Has patient had a PCN reaction occurring within the last 10 years: No If all of the above answers are "NO", then may proceed with Cephalosporin use.      Metabolic Disorder Labs: Lab Results  Component Value Date   HGBA1C 5.2 03/10/2018   MPG 102.54 03/10/2018   MPG 102.54 12/05/2016   No results found for: PROLACTIN Lab Results  Component Value Date   CHOL 137 03/10/2018   TRIG 78 03/10/2018   HDL 37 (L) 03/10/2018   CHOLHDL 3.7 03/10/2018   VLDL 16 03/10/2018   LDLCALC 84 03/10/2018   LDLCALC 70 12/05/2016   Lab Results  Component Value Date   TSH 14.938 (H) 03/10/2018   TSH 4.65 (H) 06/09/2017    Therapeutic Level Labs: No results found for: LITHIUM No results found for: VALPROATE No components found for:  CBMZ  Current Medications: Current Outpatient Medications  Medication Sig Dispense Refill  . Acetaminophen-Caffeine (EXCEDRIN ASPIRIN FREE PO) Take 2 tablets by mouth daily as needed.    . ALPRAZolam (XANAX) 1 MG tablet Take 1 tablet (1 mg total) by mouth 3 (three) times daily as needed for anxiety. 90 tablet 2  . dicyclomine (BENTYL) 20 MG tablet Take 1 tablet (20 mg total) by mouth 2 (two) times daily. 20 tablet 0  . FLUoxetine (PROZAC) 20 MG capsule Take 1 capsule (20 mg total) by mouth 3 (three) times daily.  90 capsule 2  . HYDROcodone-acetaminophen (NORCO) 5-325 MG tablet Take 1 tablet by mouth 2 (two) times daily as needed for moderate pain. 60 tablet 0  . lactulose (CHRONULAC) 10 GM/15ML solution Take 30 mLs (20 g total) by mouth 2 (two) times daily as needed for mild constipation. 1892 mL 3  . Multiple Vitamins-Minerals (CENTRUM PO) Take 1 tablet by mouth every evening.    . risperiDONE (RISPERDAL) 0.5 MG tablet Take 1 tablet (0.5 mg total) by mouth at bedtime. 30 tablet 2  . traZODone (DESYREL) 100 MG tablet Take 2 tablets (200 mg total) by mouth at bedtime as needed for sleep. 60 tablet 2   No current facility-administered medications for this visit.      Musculoskeletal: Strength & Muscle Tone: within normal limits Gait & Station: normal Patient leans: N/A  Psychiatric Specialty Exam: Review of Systems  Gastrointestinal: Positive for abdominal pain.  Musculoskeletal: Positive for joint pain.  Psychiatric/Behavioral: Positive for depression. The patient is nervous/anxious.   All other systems reviewed and are negative.   There were no vitals taken for this visit.There is no height or weight on file to calculate BMI.  General Appearance: NA  Eye Contact:  NA  Speech:  Clear and Coherent  Volume:  Normal  Mood:  Anxious  Affect:  NA  Thought Process:  Goal Directed  Orientation:  Full (Time, Place, and Person)  Thought Content: Rumination   Suicidal Thoughts:  No  Homicidal Thoughts:  No  Memory:  Immediate;   Good Recent;   Fair Remote;   Fair  Judgement:  Fair  Insight:  Fair  Psychomotor Activity:  Decreased  Concentration:  Concentration: Fair and Attention Span: Fair  Recall:  Good  Fund of Knowledge: Fair  Language: Kermit Balo  Akathisia:  No  Handed:  Right  AIMS (if indicated): not done  Assets:  Communication Skills Desire for Improvement Resilience Social Support  ADL's:  Intact  Cognition: WNL  Sleep:  Good   Screenings: AIMS     Admission (Discharged)  from OP Visit from 03/10/2018 in [facility] Admission (Discharged) from 12/03/2016 in [facility]  AIMS Total Score  0  0    AUDIT     Admission (Discharged) from OP Visit from 03/10/2018 in [facility] Admission (Discharged) from 12/03/2016 in [facility]  Alcohol Use Disorder Identification Test Final Score (AUDIT)  0  0    MDI     Office Visit from 11/15/2015 in [facility]  Total Score (max 50)  24    PHQ2-9     Office Visit from 07/07/2018 in [facility] Office Visit from 10/27/2017 in [place] Office Visit from 01/19/2017 in [place] Office Visit from 12/10/2016 in [place] Office Visit from 10/17/2016 in [facility]  PHQ-2 Total Score  0  0  4  4  2   PHQ-9 Total Score  -  -  17  15  8        Assessment and Plan: This patient is a 58 year old female with a history of PTSD depression and anxiety.  She tends to over worry about everything.  Because of the racing thoughts we have started Risperdal 0.5 mg at bedtime it seems to be helping.  She will continue this as well as trazodone 200 mg at bedtime for sleep, Prozac 20 mg 3 times daily for depression and Xanax 1 mg 3 times daily as needed for anxiety.  She will return to see me in 4 weeks   Levonne Spiller, MD 11/11/2018, 2:53 PM

## 2018-11-18 ENCOUNTER — Ambulatory Visit: Payer: Medicare Other | Admitting: Gastroenterology

## 2018-11-24 ENCOUNTER — Other Ambulatory Visit: Payer: Self-pay

## 2018-11-24 ENCOUNTER — Encounter (HOSPITAL_COMMUNITY): Payer: Self-pay | Admitting: Licensed Clinical Social Worker

## 2018-11-24 ENCOUNTER — Ambulatory Visit (INDEPENDENT_AMBULATORY_CARE_PROVIDER_SITE_OTHER): Payer: Medicare Other | Admitting: Licensed Clinical Social Worker

## 2018-11-24 DIAGNOSIS — F332 Major depressive disorder, recurrent severe without psychotic features: Secondary | ICD-10-CM

## 2018-11-24 NOTE — Progress Notes (Signed)
Virtual Visit via Telephone Note  I connected with Kathleen Palmer on 11/24/18 at 11:00 AM EDT by telephone and verified that I am speaking with the correct person using two identifiers.   I discussed the limitations, risks, security and privacy concerns of performing an evaluation and management service by telephone and the availability of in person appointments. I also discussed with the patient that there may be a patient responsible charge related to this service. The patient expressed understanding and agreed to proceed.   History of Present Illness: Major Depressive disorder, recurrent episode, severe with anxious distress.  Kathleen Palmer presents oriented x4 (person, place, situation, and object), distracted, casually groomed, causually dressed and cooperative to address depression. Patient has a history of medical issues and mental health treatment. She denies symptoms of mania. Patient denies suicidal and homicidal ideations. She denies psychosis including auditory and visual hallucinations. Patient admits to cannabis use. She is at low risk for lethality. Patient has been hospitalized 7 times due to suicidal ideations but states she has never attempted due to her religious faith.    Observations/Objective: Physical: Physical health is ok. She is having trouble with her hips. She is in pain but is taking it day by day.  Spiritual/value: No issues identified. Relationships: Patient is still feeling supported by her husband.  Emotional/Mental/Behavior: Patient is feeling ok. Patient is no longer hearing voices. She is not depressed,or anxious. She still feels like crying at times but feels like she is doing better emotionally than before.   Patient engaged in session. She responded well to interventions. Patient continues to meet criteria for Major Depressive disorder, recurrent episode, severe with anxious distress. Patient will continue in outpatient therapy due to being the least restrictive service  to meet her needs. Patient made moderate progress on her goals at this time.   Assessment and Plan: Therapist reviewed patient's recent thoughts and behaviors. Therapist utilized CBT to address depression. Therapist reviewed patient's goals.  Therapist processed patient's feelings to identify triggers for mood. Therapist discussed with patient taking things day by day and not getting overwhelmed with her medical conditions.   Suicidal/Homicidal: Negativewithout intent/plan  Follow Up Instructions: Return again in 4 weeks.   I discussed the assessment and treatment plan with the patient. The patient was provided an opportunity to ask questions and all were answered. The patient agreed with the plan and demonstrated an understanding of the instructions.   The patient was advised to call back or seek an in-person evaluation if the symptoms worsen or if the condition fails to improve as anticipated.  I provided 30 minutes of non-face-to-face time during this encounter.   [name], LCSW

## 2018-11-29 ENCOUNTER — Other Ambulatory Visit: Payer: Self-pay | Admitting: Family Medicine

## 2018-11-29 MED ORDER — HYDROCODONE-ACETAMINOPHEN 5-325 MG PO TABS: 1.0000 | ORAL_TABLET | Freq: Two times a day (BID) | ORAL | 0 refills | Status: DC | PRN

## 2018-11-29 NOTE — Telephone Encounter (Signed)
[facility] Kathleen Palmer  Patient needs refill on her hydrocodone  [phone]

## 2018-11-29 NOTE — Telephone Encounter (Signed)
Ok to refill??  Last office visit 09/27/2018.  Last refill 10/25/2018.

## 2018-12-06 ENCOUNTER — Telehealth (HOSPITAL_COMMUNITY): Payer: Self-pay | Admitting: *Deleted

## 2018-12-06 NOTE — Telephone Encounter (Signed)
SPOKE WITH PATIENT AFTER RECEIVING SEVERAL V.M. LEFT ON Saturday. SHE STATED THAT SHE ACCIDENTALLY THREW AWAY HER RX XANAX BOTTLE WITH MEDICINE INSIDE. STATED HER HUSBAND & SHE WENT THREW THE TRASH & COULDN'T FIND IT. I DIRECTED HER TO HER Rx TO ASK WHAT'S THE PROCESS FOR HER TO GET ANOTHER SCRIPT. THE EARLIEST AVAILABLE RX REFILL AFTER INSURANCE JUST FILED & SCRIPTS JUST FILLED WILL BE AVAILABLE 12/15/2018. PATIENT INFORMED

## 2018-12-08 ENCOUNTER — Other Ambulatory Visit: Payer: Self-pay

## 2018-12-08 ENCOUNTER — Ambulatory Visit (HOSPITAL_COMMUNITY): Payer: Medicare Other | Admitting: Licensed Clinical Social Worker

## 2018-12-10 ENCOUNTER — Ambulatory Visit (INDEPENDENT_AMBULATORY_CARE_PROVIDER_SITE_OTHER): Payer: Medicare Other | Admitting: Psychiatry

## 2018-12-10 ENCOUNTER — Encounter (HOSPITAL_COMMUNITY): Payer: Self-pay | Admitting: Psychiatry

## 2018-12-10 ENCOUNTER — Other Ambulatory Visit: Payer: Self-pay

## 2018-12-10 DIAGNOSIS — F332 Major depressive disorder, recurrent severe without psychotic features: Secondary | ICD-10-CM

## 2018-12-10 DIAGNOSIS — F4310 Post-traumatic stress disorder, unspecified: Secondary | ICD-10-CM

## 2018-12-10 MED ORDER — FLUOXETINE HCL 20 MG PO CAPS: 20.0000 mg | ORAL_CAPSULE | Freq: Three times a day (TID) | ORAL | 2 refills | Status: DC

## 2018-12-10 MED ORDER — TRAZODONE HCL 100 MG PO TABS: 200.0000 mg | ORAL_TABLET | Freq: Every evening | ORAL | 2 refills | Status: DC | PRN

## 2018-12-10 MED ORDER — ALPRAZOLAM 1 MG PO TABS: 1.0000 mg | ORAL_TABLET | Freq: Three times a day (TID) | ORAL | 2 refills | Status: DC | PRN

## 2018-12-10 MED ORDER — RISPERIDONE 0.5 MG PO TABS: 0.5000 mg | ORAL_TABLET | Freq: Every day | ORAL | 2 refills | Status: DC

## 2018-12-10 NOTE — Progress Notes (Signed)
Virtual Visit via Telephone Note  I connected with Kathleen Palmer on 12/10/18 at  9:20 AM EDT by telephone and verified that I am speaking with the correct person using two identifiers.   I discussed the limitations, risks, security and privacy concerns of performing an evaluation and management service by telephone and the availability of in person appointments. I also discussed with the patient that there may be a patient responsible charge related to this service. The patient expressed understanding and agreed to proceed.     I discussed the assessment and treatment plan with the patient. The patient was provided an opportunity to ask questions and all were answered. The patient agreed with the plan and demonstrated an understanding of the instructions.   The patient was advised to call back or seek an in-person evaluation if the symptoms worsen or if the condition fails to improve as anticipated.  I provided 15 minutes of non-face-to-face time during this encounter.   Levonne Spiller, MD  [facility] MD/PA/NP OP Progress Note  12/10/2018 9:32 AM Kathleen Palmer  MRN:  [id number]  Chief Complaint:  Chief Complaint    Depression; Anxiety; Follow-up     HPI: This patient is a 58 year old married white female who lives with her husband in [place].  She is on disability.  She has a history of depression anxiety and posttraumatic stress disorder as well as borderline personality traits.  The patient returns after 4 weeks for follow-up regarding depression and anxiety.  She actually states that she is doing pretty well right now.  However she asked if she can reduce her Prozac and discontinue respite all.  I asked for her reasons but she claims that "there is a lot of bad information about medications on TV."  I explained to her that the TV cannot diagnose and treat her condition and that since she was feeling better on these medications it made no sense to reduce or change them now.  She voices  understanding.  She denies any current side effects from medications.  She states right now her mood is pretty good and she and her husband are getting along well.  She denies thoughts of suicide or self-harm or severe panic attacks.  She is decided to put off the hernia surgery for now but is looking into seriously treating her hepatitis C. Visit Diagnosis:    ICD-10-CM   1. Major depressive disorder, recurrent episode, severe with anxious distress ([place])  F33.2   2. PTSD (post-traumatic stress disorder)  F43.10     Past Psychiatric History: Several previous hospitalizations for depression and suicidal ideation, the last one being last year  Past Medical History:  Past Medical History:  Diagnosis Date  . Angiomyolipoma    Left kidney  . Anxiety   . Arthritis   . Attention to urostomy [facility])   . Chronic pain   . Depression   . Hepatitis C    ? Contracted through IVDA  . Panic 04/29/1999  . PTSD (post-traumatic stress disorder)   . Substance abuse ([place])    Remote history- cocaine, ETOH, Marijuana  . Thyroid disease     Past Surgical History:  Procedure Laterality Date  . ABDOMINAL HYSTERECTOMY    . ABDOMINAL SURGERY    . BIOPSY  01/27/2017   Procedure: BIOPSY;  Surgeon: Danie Binder, MD;  [facility];  Service: Endoscopy;;  gastric  . CHOLECYSTECTOMY    . COLONOSCOPY WITH PROPOFOL N/A 01/27/2017   Procedure: COLONOSCOPY WITH  PROPOFOL;  Surgeon: Danie Binder, MD;  Location: [facility];  Service: Endoscopy;  Laterality: N/A;  12:00pm  . ESOPHAGOGASTRODUODENOSCOPY (EGD) WITH PROPOFOL N/A 01/27/2017   Procedure: ESOPHAGOGASTRODUODENOSCOPY (EGD) WITH PROPOFOL;  Surgeon: Danie Binder, MD;  Location[facility];  Service: Endoscopy;  Laterality: N/A;  . HERNIA REPAIR     x2- abdomen  . ILEO LOOP CONDUIT    . 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 VAGINA RECONSTRUCTION SURGERY      Family Psychiatric History: See below  Family History:  Family History  Adopted: Yes  Problem Relation Age of Onset  . Depression Mother   . Depression Sister   . Anxiety disorder Sister   . Bipolar disorder Sister   . Depression Maternal Grandmother   . ADD / ADHD Neg Hx   . Alcohol abuse Neg Hx   . Drug abuse Neg Hx   . Dementia Neg Hx   . OCD Neg Hx   . Paranoid behavior Neg Hx   . Schizophrenia Neg Hx   . Seizures Neg Hx   . Sexual abuse Neg Hx   . Physical abuse Neg Hx   . Suicidality Neg Hx   . Colon cancer Neg Hx     Social History:  Social History   Socioeconomic History  . Marital status: Married    Spouse name: Not on file  . Number of children: 0  . Years of education: Not on file  . Highest education level: Not on file  Occupational History  . Occupation: disability  Social Needs  . Financial resource strain: Not on file  . Food insecurity    Worry: Not on file    Inability: Not on file  . Transportation needs    Medical: Not on file    Non-medical: Not on file  Tobacco Use  . Smoking status: Former Smoker    Packs/day: 0.25    Years: 33.00    Pack years: 8.25    Types: Cigarettes    Quit date: 01/21/2015    Years since quitting: 3.8  . Smokeless tobacco: Never Used  . Tobacco comment: 9-10 cigs a day as of 10/20/2012, (02-07-15 per pt, she stopped smoking 01-21-15)  Substance and Sexual Activity  . Alcohol use: No    Alcohol/week: 0.0 standard drinks  . Drug use: Not Currently    Types: Marijuana    Comment: quit 10 days   . Sexual activity: Yes    Birth control/protection: Surgical  Lifestyle  . Physical activity    Days per week: Not on file    Minutes per session: Not on file  . Stress: Not on file  Relationships  . Social Herbalist on phone: Not on file    Gets together: Not on file    Attends religious service: Not on file    Active member of club or organization: Not on file     Attends meetings of clubs or organizations: Not on file    Relationship status: Not on file  Other Topics Concern  . Not on file  Social History Narrative  . Not on file    Allergies:  Allergies  Allergen Reactions  . Oxycodone Anxiety  . Abilify [Aripiprazole]     Stiff neck  . Bactrim [Sulfamethoxazole-Trimethoprim]     Tongue swelled  . Gabapentin  Dizziness to the point she actually fell  . Ampicillin Rash    Has patient had a PCN reaction causing immediate rash, facial/tongue/throat swelling, SOB or lightheadedness with hypotension: Unknown Has patient had a PCN reaction causing severe rash involving mucus membranes or skin necrosis: Unknown Has patient had a PCN reaction that required hospitalization: No Has patient had a PCN reaction occurring within the last 10 years: No If all of the above answers are "NO", then may proceed with Cephalosporin use.      Metabolic Disorder Labs: Lab Results  Component Value Date   HGBA1C 5.2 03/10/2018   MPG 102.54 03/10/2018   MPG 102.54 12/05/2016   No results found for: PROLACTIN Lab Results  Component Value Date   CHOL 137 03/10/2018   TRIG 78 03/10/2018   HDL 37 (L) 03/10/2018   CHOLHDL 3.7 03/10/2018   VLDL 16 03/10/2018   LDLCALC 84 03/10/2018   LDLCALC 70 12/05/2016   Lab Results  Component Value Date   TSH 14.938 (H) 03/10/2018   TSH 4.65 (H) 06/09/2017    Therapeutic Level Labs: No results found for: LITHIUM No results found for: VALPROATE No components found for:  CBMZ  Current Medications: Current Outpatient Medications  Medication Sig Dispense Refill  . Acetaminophen-Caffeine (EXCEDRIN ASPIRIN FREE PO) Take 2 tablets by mouth daily as needed.    . ALPRAZolam (XANAX) 1 MG tablet Take 1 tablet (1 mg total) by mouth 3 (three) times daily as needed for anxiety. 90 tablet 2  . dicyclomine (BENTYL) 20 MG tablet Take 1 tablet (20 mg total) by mouth 2 (two) times daily. 20 tablet 0  . FLUoxetine (PROZAC)  20 MG capsule Take 1 capsule (20 mg total) by mouth 3 (three) times daily. 90 capsule 2  . HYDROcodone-acetaminophen (NORCO) 5-325 MG tablet Take 1 tablet by mouth 2 (two) times daily as needed for moderate pain. 60 tablet 0  . lactulose (CHRONULAC) 10 GM/15ML solution Take 30 mLs (20 g total) by mouth 2 (two) times daily as needed for mild constipation. 1892 mL 3  . Multiple Vitamins-Minerals (CENTRUM PO) Take 1 tablet by mouth every evening.    . risperiDONE (RISPERDAL) 0.5 MG tablet Take 1 tablet (0.5 mg total) by mouth at bedtime. 30 tablet 2  . traZODone (DESYREL) 100 MG tablet Take 2 tablets (200 mg total) by mouth at bedtime as needed for sleep. 60 tablet 2   No current facility-administered medications for this visit.      Musculoskeletal: Strength & Muscle Tone: within normal limits Gait & Station: normal Patient leans: N/A  Psychiatric Specialty Exam: Review of Systems  Gastrointestinal: Positive for abdominal pain.  Psychiatric/Behavioral: The patient is nervous/anxious.   All other systems reviewed and are negative.   There were no vitals taken for this visit.There is no height or weight on file to calculate BMI.  General Appearance: NA  Eye Contact:  NA  Speech:  Clear and Coherent  Volume:  Normal  Mood:  Anxious and Euthymic  Affect:  NA  Thought Process:  Goal Directed  Orientation:  Full (Time, Place, and Person)  Thought Content: Rumination   Suicidal Thoughts:  No  Homicidal Thoughts:  No  Memory:  Immediate;   Good Recent;   Good Remote;   Fair  Judgement:  Fair  Insight:  Fair  Psychomotor Activity:  Decreased  Concentration:  Concentration: Good and Attention Span: Good  Recall:  Good  Fund of Knowledge: Good  Language: Good  Akathisia:  No  Handed:  Right  AIMS (if indicated): not done  Assets:  Communication Skills Desire for Improvement Resilience Social Support Talents/Skills  ADL's:  Intact  Cognition: WNL  Sleep:  Good    Screenings: AIMS     Admission (Discharged) from OP Visit from 03/10/2018 in [place] [facility] Admission (Discharged) from 12/03/2016 in [place] [facility]  AIMS Total Score  0  0    AUDIT     Admission (Discharged) from OP Visit from 03/10/2018 in [place] [facility] Admission (Discharged) from 12/03/2016 in [place] [facility]  Alcohol Use Disorder Identification Test Final Score (AUDIT)  0  0    MDI     Office Visit from 11/15/2015 in [place] [facility]  Total Score (max 50)  24    PHQ2-9     Office Visit from 07/07/2018 in [place] Office Visit from 10/27/2017 in [place] Office Visit from 01/19/2017 in [place] Office Visit from 12/10/2016 in [place] Office Visit from 10/17/2016 in [place]  PHQ-2 Total Score  0  0  4  4  2   PHQ-9 Total Score  -  -  17  15  8        Assessment and Plan: This patient is a 58 year old female with a history of PTSD depression and anxiety.  Now she is questioning her medications and I urged her not to do so because they seem to be helping.  She will continue Respinol 0.5 mg at bedtime for mood stabilization, trazodone 200 mg at bedtime for sleep, Prozac 20 mg 3 times daily for depression and Xanax 1 mg 3 times daily as needed for anxiety.  She will return to see me in 6 weeks   Levonne Spiller, MD 12/10/2018, 9:32 AM

## 2018-12-15 ENCOUNTER — Ambulatory Visit: Payer: Medicare Other | Admitting: Gastroenterology

## 2018-12-22 ENCOUNTER — Encounter (HOSPITAL_COMMUNITY): Payer: Self-pay | Admitting: Licensed Clinical Social Worker

## 2018-12-22 ENCOUNTER — Other Ambulatory Visit: Payer: Self-pay

## 2018-12-22 ENCOUNTER — Ambulatory Visit (INDEPENDENT_AMBULATORY_CARE_PROVIDER_SITE_OTHER): Payer: Medicare Other | Admitting: Licensed Clinical Social Worker

## 2018-12-22 DIAGNOSIS — F332 Major depressive disorder, recurrent severe without psychotic features: Secondary | ICD-10-CM

## 2018-12-22 NOTE — Progress Notes (Signed)
Virtual Visit via Telephone Note  I connected with Kathleen Palmer on 12/22/18 at 11:00 AM EDT by telephone and verified that I am speaking with the correct person using two identifiers.   I discussed the limitations, risks, security and privacy concerns of performing an evaluation and management service by telephone and the availability of in person appointments. I also discussed with the patient that there may be a patient responsible charge related to this service. The patient expressed understanding and agreed to proceed.   History of Present Illness: Major Depressive disorder, recurrent episode, severe with anxious distress.  Kathleen Palmer presents oriented x4 (person, place, situation, and object), distracted, casually groomed, causually dressed and cooperative to address depression. Patient has a history of medical issues and mental health treatment. She denies symptoms of mania. Patient denies suicidal and homicidal ideations. She denies psychosis including auditory and visual hallucinations. Patient admits to cannabis use. She is at low risk for lethality. Patient has been hospitalized 7 times due to suicidal ideations but states she has never attempted due to her religious faith.    Observations/Objective: Physical: Patient is doing well physically. Patient has stopped smoking cannabis and feels better. She is going to delay getting a hernia surgery until next Spring.   Spiritual/value: No issues identified. Relationships: Patient is getting along with her husband. She feels like her husband "goes off the deep end" but she can't worry about him.  Emotional/Mental/Behavior: [name] mood is stable. She denies depression and anxiety. Patient is cleaning her home, getting out of the home, and seeing people.  Patient engaged in session. She responded well to interventions. Patient continues to meet criteria for Major Depressive disorder, recurrent episode, severe with anxious distress. Patient will  continue in outpatient therapy due to being the least restrictive service to meet her needs. Patient made moderate progress on her goals at this time.   Assessment and Plan: Therapist reviewed patient's recent thoughts and behaviors. Therapist utilized CBT to address depression. Therapist reviewed patient's goals.  Therapist processed patient's feelings to identify triggers for mood. Therapist discussed with patient what has improved including stopping cannabis use.   Suicidal/Homicidal: Negativewithout intent/plan  Follow Up Instructions: Return again in 4 weeks.   I discussed the assessment and treatment plan with the patient. The patient was provided an opportunity to ask questions and all were answered. The patient agreed with the plan and demonstrated an understanding of the instructions.   The patient was advised to call back or seek an in-person evaluation if the symptoms worsen or if the condition fails to improve as anticipated.  I provided 30 minutes of non-face-to-face time during this encounter.   [name], LCSW

## 2018-12-27 ENCOUNTER — Encounter: Payer: Medicare Other | Admitting: Family Medicine

## 2019-01-07 ENCOUNTER — Other Ambulatory Visit: Payer: Self-pay | Admitting: *Deleted

## 2019-01-07 MED ORDER — HYDROCODONE-ACETAMINOPHEN 5-325 MG PO TABS: 1.0000 | ORAL_TABLET | Freq: Two times a day (BID) | ORAL | 0 refills | Status: DC | PRN

## 2019-01-07 NOTE — Telephone Encounter (Signed)
Received call from patient.   Requested refill on Hydrocodone/APAP.  Ok to refill??  Last office visit 09/27/2018.  Last refill 11/29/2018.

## 2019-01-12 DIAGNOSIS — B182 Chronic viral hepatitis C: Secondary | ICD-10-CM | POA: Diagnosis not present

## 2019-01-12 DIAGNOSIS — K7469 Other cirrhosis of liver: Secondary | ICD-10-CM | POA: Diagnosis not present

## 2019-01-14 ENCOUNTER — Other Ambulatory Visit (HOSPITAL_COMMUNITY): Payer: Self-pay | Admitting: Nurse Practitioner

## 2019-01-14 ENCOUNTER — Other Ambulatory Visit: Payer: Self-pay | Admitting: Nurse Practitioner

## 2019-01-14 ENCOUNTER — Ambulatory Visit (INDEPENDENT_AMBULATORY_CARE_PROVIDER_SITE_OTHER): Payer: Medicare Other | Admitting: Family Medicine

## 2019-01-14 ENCOUNTER — Other Ambulatory Visit: Payer: Self-pay

## 2019-01-14 ENCOUNTER — Encounter: Payer: Self-pay | Admitting: Family Medicine

## 2019-01-14 VITALS — BP 126/82 | HR 70 | Temp 98.3°F | Resp 14 | Ht 63.0 in | Wt 161.0 lb

## 2019-01-14 DIAGNOSIS — I700 Atherosclerosis of aorta: Secondary | ICD-10-CM | POA: Diagnosis not present

## 2019-01-14 DIAGNOSIS — K436 Other and unspecified ventral hernia with obstruction, without gangrene: Secondary | ICD-10-CM

## 2019-01-14 DIAGNOSIS — B182 Chronic viral hepatitis C: Secondary | ICD-10-CM | POA: Diagnosis not present

## 2019-01-14 DIAGNOSIS — Z0001 Encounter for general adult medical examination with abnormal findings: Secondary | ICD-10-CM

## 2019-01-14 DIAGNOSIS — K7469 Other cirrhosis of liver: Secondary | ICD-10-CM

## 2019-01-14 DIAGNOSIS — Z0000 Encounter for general adult medical examination without abnormal findings: Secondary | ICD-10-CM

## 2019-01-14 DIAGNOSIS — M8580 Other specified disorders of bone density and structure, unspecified site: Secondary | ICD-10-CM

## 2019-01-14 DIAGNOSIS — E038 Other specified hypothyroidism: Secondary | ICD-10-CM

## 2019-01-14 DIAGNOSIS — Z932 Ileostomy status: Secondary | ICD-10-CM | POA: Diagnosis not present

## 2019-01-14 DIAGNOSIS — Z1239 Encounter for other screening for malignant neoplasm of breast: Secondary | ICD-10-CM | POA: Diagnosis not present

## 2019-01-14 DIAGNOSIS — G894 Chronic pain syndrome: Secondary | ICD-10-CM | POA: Diagnosis not present

## 2019-01-14 DIAGNOSIS — E039 Hypothyroidism, unspecified: Secondary | ICD-10-CM | POA: Diagnosis not present

## 2019-01-14 MED ORDER — SHINGRIX 50 MCG/0.5ML IM SUSR: 0.5000 mL | Freq: Once | INTRAMUSCULAR | 1 refills | Status: AC

## 2019-01-14 NOTE — Progress Notes (Addendum)
Subjective:   Patient presents for Medicare Annual/Subsequent preventive examination.    Chronic pain- was asking for higher strength of norco, states she has a lot of pain    She is suppose to have appt with GI, due to endoscopy needs new referral   - Kathleen Palmer treatment didn't not work for her Hep C  - she is still on lactulose for cirrhosis she is taking twice a day - needs refill    Psychiatry- still seeing [name] for therapy and [name]   Meds revivewed      Review Past Medical/Family/Social: Per EMR   Risk Factors  Current exercise habits: None  Dietary issues discussed:   Cardiac risk factors: aortic atherosclerosis  Depression Screen - Treated by psychiatry - PH9 Score 8  (Note: if answer to either of the following is "Yes", a more complete depression screening is indicated)  Over the past two weeks, have you felt down, depressed or hopeless? Yes Over the past two weeks, have you felt little interest or pleasure in doing things? No Have you lost interest or pleasure in daily life? No Do you often feel hopeless? Yes Do you cry easily over simple problems? Yes  Activities of Daily Living  In your present state of health, do you have any difficulty performing the following activities?:  Driving?Yes Managing money? No  Feeding yourself? No  Getting from bed to chair? No  Climbing a flight of stairs? No  Preparing food and eating?: No  Bathing or showering? No  Getting dressed: No  Getting to the toilet? No  Using the toilet:No  Moving around from place to place: Yes  In the past year have you fallen or had a near fall?:No  Are you sexually active? No  Do you have more than one partner? No   Hearing Difficulties: No  Do you often ask people to speak up or repeat themselves? No  Do you experience ringing or noises in your ears? No Do you have difficulty understanding soft or whispered voices? No  Do you feel that you have a problem with  memory? No Do you often misplace items? No  Do you feel safe at home? Yes  Cognitive Testing  Alert? Yes Normal Appearance?Yes  Oriented to person? Yes Place? Yes  Time? Yes  Recall of three objects? Yes  Can perform simple calculations? Yes  Displays appropriate judgment?Yes  Can read the correct time from a watch face?Yes   List the Names of Other Physician/Practitioners you currently use:   GI / psychiatry  Screening Tests / Date Colonoscopy      UTD                Zostavax - Due  Mammogram - last done  2018  Influenza Vaccine  Declined  PAP SMEAR- Unable to perform due to anatomy Tetanus/tdapUTD  ROS: GEN- denies fatigue, fever, weight loss,weakness, recent illness HEENT- denies eye drainage, change in vision, nasal discharge, CVS- denies chest pain, palpitations RESP- denies SOB, cough, wheeze ABD- denies N/V, change in stools, abd pain GU- denies dysuria, hematuria, dribbling, incontinence MSK- denies joint pain, muscle aches, injury Neuro- denies headache, dizziness, syncope, seizure activity  Physical: GEN- NAD, alert and oriented x3 HEENT- PERRL, EOMI, non injected sclera, pink conjunctiva, MMM, oropharynx clear Neck- Supple, no thryomegaly CVS- RRR, no murmur RESP-CTAB ABD-NABS,soft,NT,ND, Urostomy in tact,ventral hernia  Psych- fair affect- a little depressed, not anxious, not tearful EXT- No edema Pulses- Radial,  DP- 2+    Assessment:    Annual wellness medicare exam   Plan:    During the course of the visit the patient was educated and counseled about appropriate screening and preventive services including:   Immunizations- declines flu shot / discussed shingles vaccine sent to pharmacy   Unable to do PAP Smear due to reconstruve vaginal surgery    Colonoscopy UTD    Due for Mammogram / Bone density- pt to schedule   +Fall- pt walks with cane  Chronic pain- no changes to be made to her pain medications, recommended pain referral she  declines at this time, in setting of her history and on benzo will not increase dosage or quantity  Hep C with cirrhosis- referral to GI, will reach out to Hep C clinic for last notes   - reviewed nots, need EGD wth GI, I referred to [facility] for her hernia back in March, pt missed appt, needs new referral    aortic atherosclerosis - recheck lipids, no statin in setting of her cirrhosis     MDD/GAD/PTSD- f/u with psychiatry           Diet review for nutrition referral? Yes ____ Not Indicated __x__  Patient Instructions (the written plan) was given to the patient.  Medicare Attestation  I have personally reviewed:  The patient's medical and social history  Their use of alcohol, tobacco or illicit drugs  Their current medications and supplements  The patient's functional ability including ADLs,fall risks, home safety risks, cognitive, and hearing and visual impairment  Diet and physical activities  Evidence for depression or mood disorders  The patient's weight, height, BMI, and visual acuity have been recorded in the chart. I have made referrals, counseling, and provided education to the patient based on review of the above and I have provided the patient with a written personalized care plan for preventive services.

## 2019-01-14 NOTE — Patient Instructions (Addendum)
Referral to GI  I will check with Dawn Dreznek  Schedule Bone Density or Mammogram  Shingles vaccine sent to pharmacy  F/U  6 MONTHS

## 2019-01-15 LAB — CBC WITH DIFFERENTIAL/PLATELET
Absolute Monocytes: 386 cells/uL (ref 200–950)
Basophils Absolute: 9 cells/uL (ref 0–200)
Basophils Relative: 0.2 %
Eosinophils Absolute: 18 cells/uL (ref 15–500)
Eosinophils Relative: 0.4 %
HCT: 41 % (ref 35.0–45.0)
Hemoglobin: 14.1 g/dL (ref 11.7–15.5)
Lymphs Abs: 1343 cells/uL (ref 850–3900)
MCH: 30.3 pg (ref 27.0–33.0)
MCHC: 34.4 g/dL (ref 32.0–36.0)
MCV: 88 fL (ref 80.0–100.0)
MPV: 12.1 fL (ref 7.5–12.5)
Monocytes Relative: 8.4 %
Neutro Abs: 2843 cells/uL (ref 1500–7800)
Neutrophils Relative %: 61.8 %
Platelets: 83 10*3/uL — ABNORMAL LOW (ref 140–400)
RBC: 4.66 10*6/uL (ref 3.80–5.10)
RDW: 13.9 % (ref 11.0–15.0)
Total Lymphocyte: 29.2 %
WBC: 4.6 10*3/uL (ref 3.8–10.8)

## 2019-01-15 LAB — COMPREHENSIVE METABOLIC PANEL
AG Ratio: 1.3 (calc) (ref 1.0–2.5)
ALT: 104 U/L — ABNORMAL HIGH (ref 6–29)
AST: 116 U/L — ABNORMAL HIGH (ref 10–35)
Albumin: 3.8 g/dL (ref 3.6–5.1)
Alkaline phosphatase (APISO): 64 U/L (ref 37–153)
BUN: 16 mg/dL (ref 7–25)
CO2: 25 mmol/L (ref 20–32)
Calcium: 9.3 mg/dL (ref 8.6–10.4)
Chloride: 106 mmol/L (ref 98–110)
Creat: 0.73 mg/dL (ref 0.50–0.99)
Globulin: 2.9 g/dL (calc) (ref 1.9–3.7)
Glucose, Bld: 73 mg/dL (ref 65–99)
Potassium: 3.8 mmol/L (ref 3.5–5.3)
Sodium: 144 mmol/L (ref 135–146)
Total Bilirubin: 0.7 mg/dL (ref 0.2–1.2)
Total Protein: 6.7 g/dL (ref 6.1–8.1)

## 2019-01-15 LAB — LIPID PANEL
Cholesterol: 123 mg/dL (ref <200)
HDL: 36 mg/dL — ABNORMAL LOW (ref >=50)
LDL Cholesterol (Calc): 70 mg/dL (calc)
Non-HDL Cholesterol (Calc): 87 mg/dL (calc) (ref <130)
Total CHOL/HDL Ratio: 3.4 (calc) (ref <5.0)
Triglycerides: 90 mg/dL (ref <150)

## 2019-01-15 LAB — TSH: TSH: 0.87 mIU/L (ref 0.40–4.50)

## 2019-01-15 LAB — T3, FREE: T3, Free: 2.7 pg/mL (ref 2.3–4.2)

## 2019-01-15 LAB — T4, FREE: Free T4: 0.8 ng/dL (ref 0.8–1.8)

## 2019-01-17 NOTE — Addendum Note (Signed)
Addended by: Vic Blackbird F on: 01/17/2019 07:48 AM   Modules accepted: Orders

## 2019-01-21 ENCOUNTER — Other Ambulatory Visit: Payer: Self-pay

## 2019-01-21 ENCOUNTER — Encounter (HOSPITAL_COMMUNITY): Payer: Self-pay | Admitting: Psychiatry

## 2019-01-21 ENCOUNTER — Ambulatory Visit (INDEPENDENT_AMBULATORY_CARE_PROVIDER_SITE_OTHER): Payer: Medicare Other | Admitting: Psychiatry

## 2019-01-21 DIAGNOSIS — F4310 Post-traumatic stress disorder, unspecified: Secondary | ICD-10-CM

## 2019-01-21 DIAGNOSIS — F332 Major depressive disorder, recurrent severe without psychotic features: Secondary | ICD-10-CM | POA: Diagnosis not present

## 2019-01-21 MED ORDER — ALPRAZOLAM 1 MG PO TABS: 1.0000 mg | ORAL_TABLET | Freq: Three times a day (TID) | ORAL | 2 refills | Status: DC | PRN

## 2019-01-21 MED ORDER — FLUOXETINE HCL 20 MG PO CAPS: 20.0000 mg | ORAL_CAPSULE | Freq: Three times a day (TID) | ORAL | 2 refills | Status: DC

## 2019-01-21 MED ORDER — TRAZODONE HCL 100 MG PO TABS: 200.0000 mg | ORAL_TABLET | Freq: Every evening | ORAL | 2 refills | Status: DC | PRN

## 2019-01-21 NOTE — Progress Notes (Signed)
Virtual Visit via Telephone Note  I connected with Kathleen Palmer on 01/21/19 at 10:20 AM EDT by telephone and verified that I am speaking with the correct person using two identifiers.   I discussed the limitations, risks, security and privacy concerns of performing an evaluation and management service by telephone and the availability of in person appointments. I also discussed with the patient that there may be a patient responsible charge related to this service. The patient expressed understanding and agreed to proceed.      I discussed the assessment and treatment plan with the patient. The patient was provided an opportunity to ask questions and all were answered. The patient agreed with the plan and demonstrated an understanding of the instructions.   The patient was advised to call back or seek an in-person evaluation if the symptoms worsen or if the condition fails to improve as anticipated.  I provided 15 minutes of non-face-to-face time during this encounter.   Levonne Spiller, MD  [facility] MD/PA/NP OP Progress Note  01/21/2019 10:42 AM Kathleen Palmer  MRN:  [id number]  Chief Complaint:  Chief Complaint    Depression; Anxiety; Follow-up     HPI: This patient is a 58 year old married white female who lives with her husband in [place].  She is on disability.  She has a history of depression, anxiety and posttraumatic stress disorder as well as borderline personality traits.  The patient returns after 6 weeks.  She states that since we started Risperdal for mood swings her hair is started to fall out.  She claims it is falling out in clumps when she brushes her hair.  She is not sure the Risperdal is helped that much so I suggested she stop it.  She recently saw her family doctor and is going to be following up and getting more assessment for hepatitis C and her hernia.  She states her mood is somewhat up and down but for the most part stable and she denies serious depression or  suicidal ideation.  She is sleeping well at night and the Xanax continues to help her anxiety. Visit Diagnosis:    ICD-10-CM   1. Major depressive disorder, recurrent episode, severe with anxious distress ([place])  F33.2   2. PTSD (post-traumatic stress disorder)  F43.10     Past Psychiatric History: Several previous hospitalizations for depression and suicidal ideation, the last one being last year  Past Medical History:  Past Medical History:  Diagnosis Date  . Angiomyolipoma    Left kidney  . Anxiety   . Arthritis   . Attention to urostomy [facility])   . Chronic pain   . Depression   . Hepatitis C    ? Contracted through IVDA  . Panic 04/29/1999  . PTSD (post-traumatic stress disorder)   . Substance abuse ([place])    Remote history- cocaine, ETOH, Marijuana  . Thyroid disease     Past Surgical History:  Procedure Laterality Date  . ABDOMINAL HYSTERECTOMY    . ABDOMINAL SURGERY    . BIOPSY  01/27/2017   Procedure: BIOPSY;  Surgeon: Danie Binder, MD;  Location[facility];  Service: Endoscopy;;  gastric  . CHOLECYSTECTOMY    . COLONOSCOPY WITH PROPOFOL N/A 01/27/2017   Procedure: COLONOSCOPY WITH PROPOFOL;  Surgeon: Danie Binder, MD;  Location: [facility];  Service: Endoscopy;  Laterality: N/A;  12:00pm  . ESOPHAGOGASTRODUODENOSCOPY (EGD) WITH PROPOFOL N/A 01/27/2017   Procedure: ESOPHAGOGASTRODUODENOSCOPY (EGD) WITH PROPOFOL;  Surgeon: Danie Binder,  MD;  Location: [facility];  Service: Endoscopy;  Laterality: N/A;  . HERNIA REPAIR     x2- abdomen  . ILEO LOOP CONDUIT    . 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 VAGINA RECONSTRUCTION SURGERY      Family Psychiatric History: see below  Family History:  Family History  Adopted: Yes  Problem Relation Age of Onset  . Depression Mother   . Depression Sister   . Anxiety disorder Sister   . Bipolar  disorder Sister   . Depression Maternal Grandmother   . ADD / ADHD Neg Hx   . Alcohol abuse Neg Hx   . Drug abuse Neg Hx   . Dementia Neg Hx   . OCD Neg Hx   . Paranoid behavior Neg Hx   . Schizophrenia Neg Hx   . Seizures Neg Hx   . Sexual abuse Neg Hx   . Physical abuse Neg Hx   . Suicidality Neg Hx   . Colon cancer Neg Hx     Social History:  Social History   Socioeconomic History  . Marital status: Married    Spouse name: Not on file  . Number of children: 0  . Years of education: Not on file  . Highest education level: Not on file  Occupational History  . Occupation: disability  Social Needs  . Financial resource strain: Not on file  . Food insecurity    Worry: Not on file    Inability: Not on file  . Transportation needs    Medical: Not on file    Non-medical: Not on file  Tobacco Use  . Smoking status: Former Smoker    Packs/day: 0.25    Years: 33.00    Pack years: 8.25    Types: Cigarettes    Quit date: 01/21/2015    Years since quitting: 4.0  . Smokeless tobacco: Never Used  . Tobacco comment: 9-10 cigs a day as of 10/20/2012, (02-07-15 per pt, she stopped smoking 01-21-15)  Substance and Sexual Activity  . Alcohol use: No    Alcohol/week: 0.0 standard drinks  . Drug use: Not Currently    Types: Marijuana    Comment: quit 10 days   . Sexual activity: Yes    Birth control/protection: Surgical  Lifestyle  . Physical activity    Days per week: Not on file    Minutes per session: Not on file  . Stress: Not on file  Relationships  . Social Herbalist on phone: Not on file    Gets together: Not on file    Attends religious service: Not on file    Active member of club or organization: Not on file    Attends meetings of clubs or organizations: Not on file    Relationship status: Not on file  Other Topics Concern  . Not on file  Social History Narrative  . Not on file    Allergies:  Allergies  Allergen Reactions  . Oxycodone Anxiety   . Abilify [Aripiprazole]     Stiff neck  . Bactrim [Sulfamethoxazole-Trimethoprim]     Tongue swelled  . Gabapentin     Dizziness to the point she actually fell  . Ampicillin Rash    Has patient had a PCN reaction causing immediate rash, facial/tongue/throat swelling, SOB or lightheadedness with hypotension: Unknown Has patient had a PCN  reaction causing severe rash involving mucus membranes or skin necrosis: Unknown Has patient had a PCN reaction that required hospitalization: No Has patient had a PCN reaction occurring within the last 10 years: No If all of the above answers are "NO", then may proceed with Cephalosporin use.      Metabolic Disorder Labs: Lab Results  Component Value Date   HGBA1C 5.2 03/10/2018   MPG 102.54 03/10/2018   MPG 102.54 12/05/2016   No results found for: PROLACTIN Lab Results  Component Value Date   CHOL 123 01/14/2019   TRIG 90 01/14/2019   HDL 36 (L) 01/14/2019   CHOLHDL 3.4 01/14/2019   VLDL 16 03/10/2018   LDLCALC 70 01/14/2019   LDLCALC 84 03/10/2018   Lab Results  Component Value Date   TSH 0.87 01/14/2019   TSH 14.938 (H) 03/10/2018    Therapeutic Level Labs: No results found for: LITHIUM No results found for: VALPROATE No components found for:  CBMZ  Current Medications: Current Outpatient Medications  Medication Sig Dispense Refill  . Acetaminophen-Caffeine (EXCEDRIN ASPIRIN FREE PO) Take 2 tablets by mouth daily as needed.    . ALPRAZolam (XANAX) 1 MG tablet Take 1 tablet (1 mg total) by mouth 3 (three) times daily as needed for anxiety. 90 tablet 2  . Cyanocobalamin (VITAMIN B 12) 100 MCG LOZG Take by mouth.    Marland Kitchen FLUoxetine (PROZAC) 20 MG capsule Take 1 capsule (20 mg total) by mouth 3 (three) times daily. 90 capsule 2  . HYDROcodone-acetaminophen (NORCO) 5-325 MG tablet Take 1 tablet by mouth 2 (two) times daily as needed for moderate pain. 60 tablet 0  . lactulose (CHRONULAC) 10 GM/15ML solution Take 30 mLs (20 g total)  by mouth 2 (two) times daily as needed for mild constipation. 1892 mL 3  . Multiple Vitamins-Minerals (CENTRUM PO) Take 1 tablet by mouth every evening.    . traZODone (DESYREL) 100 MG tablet Take 2 tablets (200 mg total) by mouth at bedtime as needed for sleep. 60 tablet 2   No current facility-administered medications for this visit.      Musculoskeletal: Strength & Muscle Tone: within normal limits Gait & Station: normal Patient leans: N/A  Psychiatric Specialty Exam: Review of Systems  Gastrointestinal: Positive for abdominal pain.  Psychiatric/Behavioral: The patient is nervous/anxious.   All other systems reviewed and are negative.   There were no vitals taken for this visit.There is no height or weight on file to calculate BMI.  General Appearance: NA  Eye Contact:  NA  Speech:  Clear and Coherent  Volume:  Normal  Mood:  Anxious  Affect:  NA  Thought Process:  Goal Directed  Orientation:  Full (Time, Place, and Person)  Thought Content: Rumination   Suicidal Thoughts:  No  Homicidal Thoughts:  No  Memory:  Immediate;   Good Recent;   Good Remote;   Fair  Judgement:  Fair  Insight:  Shallow  Psychomotor Activity:  Decreased  Concentration:  Concentration: Fair and Attention Span: Fair  Recall:  Good  Fund of Knowledge: Good  Language: Good  Akathisia:  No  Handed:  Right  AIMS (if indicated): not done  Assets:  Communication Skills Desire for Improvement Resilience Social Support  ADL's:  Intact  Cognition: WNL  Sleep:  Good   Screenings: AIMS     Admission (Discharged) from OP Visit from 03/10/2018 in [place] [facility] Admission (Discharged) from 12/03/2016 in [place] [facility]  AIMS Total Score  0  0    AUDIT     Admission (Discharged) from OP Visit from 03/10/2018 in [place] [facility] Admission (Discharged) from 12/03/2016 in [facility] [facility]   Alcohol Use Disorder Identification Test Final Score (AUDIT)  0  0    MDI     Office Visit from 11/15/2015 in [facility]  Total Score (max 50)  24    PHQ2-9     Office Visit from 01/14/2019 in [place] Office Visit from 07/07/2018 in [place] Office Visit from 10/27/2017 in [place] Office Visit from 01/19/2017 in [place] Office Visit from 12/10/2016 in [place]  PHQ-2 Total Score  2  0  0  4  4  PHQ-9 Total Score  8  -  -  17  15       Assessment and Plan: This patient is a 58 year old female with a history of PTSD, depression and anxiety.  We will stop Risperdal because of hair loss.  She will continue trazodone 200 mg at bedtime for sleep, Prozac 20 mg 3 times daily for depression and Xanax 1 mg 3 times daily as needed for anxiety.  She will return to see me in 4 weeks   Levonne Spiller, MD 01/21/2019, 10:42 AM

## 2019-01-24 ENCOUNTER — Ambulatory Visit (INDEPENDENT_AMBULATORY_CARE_PROVIDER_SITE_OTHER): Payer: Medicare Other | Admitting: Licensed Clinical Social Worker

## 2019-01-24 ENCOUNTER — Encounter (HOSPITAL_COMMUNITY): Payer: Self-pay | Admitting: Licensed Clinical Social Worker

## 2019-01-24 ENCOUNTER — Other Ambulatory Visit: Payer: Self-pay

## 2019-01-24 DIAGNOSIS — F332 Major depressive disorder, recurrent severe without psychotic features: Secondary | ICD-10-CM

## 2019-01-24 NOTE — Progress Notes (Signed)
Virtual Visit via Telephone Note  I connected with Kathleen Palmer on 01/24/19 at 11:00 AM EDT by telephone and verified that I am speaking with the correct person using two identifiers.   I discussed the limitations, risks, security and privacy concerns of performing an evaluation and management service by telephone and the availability of in person appointments. I also discussed with the patient that there may be a patient responsible charge related to this service. The patient expressed understanding and agreed to proceed.   History of Present Illness: Major Depressive disorder, recurrent episode, severe with anxious distress.  Kathleen Palmer presents oriented x4 (person, place, situation, and object), distracted, casually groomed, causually dressed and cooperative to address depression. Patient has a history of medical issues and mental health treatment. She denies symptoms of mania. Patient denies suicidal and homicidal ideations. She denies psychosis including auditory and visual hallucinations. Patient admits to cannabis use. She is at low risk for lethality. Patient has been hospitalized 7 times due to suicidal ideations but states she has never attempted due to her religious faith.    Observations/Objective: Physical: Patient admitted she has been falling especially at night. After discussion, patient agreed to use her walker or cane. She understood that she can't allow "embarassment" or "thinking I'm young" to prevent her from using her walker. Patient understood that she needs to safely continue to be active and reduce her distance she is currently walking until she recovers from her walk as well as use the walker any time she is being active. Patient also agreed to leave the walker by her bed side to use when she gets up at night.    Spiritual/value: No issues identified. Relationships: Patient did not report any issues with her spouse at this time.  Emotional/Mental/Behavior: [name] mood is  stable. She admits that she still has times where she cries but it is less than what it used to be. Patient is able to recognize when she is starting to "slip" back into her depressed state and this helps her do something different. Patient at times struggles with getting older but accepts that this is occurring and needs to take care of herself in such as way to prevent further injury or pain. She denies cannabis use. She denies alcohol use. She reported that she felt good telling her doctor there was "nothing in my system."   Patient engaged in session. She responded well to interventions. Patient continues to meet criteria for Major Depressive disorder, recurrent episode, severe with anxious distress. Patient will continue in outpatient therapy due to being the least restrictive service to meet her needs. Patient made moderate progress on her goals at this time.   Assessment and Plan: Therapist reviewed patient's recent thoughts and behaviors. Therapist utilized CBT to address depression. Therapist reviewed patient's goals.  Therapist processed patient's feelings to identify triggers for mood. Therapist discussed with patient her physical health and how "catching" herself being depressed has helped improve her mood.   Suicidal/Homicidal: Negativewithout intent/plan  Follow Up Instructions: Return again in 4 weeks.   I discussed the assessment and treatment plan with the patient. The patient was provided an opportunity to ask questions and all were answered. The patient agreed with the plan and demonstrated an understanding of the instructions.   The patient was advised to call back or seek an in-person evaluation if the symptoms worsen or if the condition fails to improve as anticipated.  I provided 40 minutes of non-face-to-face time during this encounter.  [name], LCSW

## 2019-02-02 ENCOUNTER — Other Ambulatory Visit: Payer: Self-pay

## 2019-02-02 ENCOUNTER — Ambulatory Visit (HOSPITAL_COMMUNITY)
Admission: RE | Admit: 2019-02-02 | Discharge: 2019-02-02 | Disposition: A | Discharge status: Home / Self Care | Payer: Medicare Other | Source: Ambulatory Visit | Attending: Nurse Practitioner | Admitting: Nurse Practitioner

## 2019-02-02 DIAGNOSIS — K7460 Unspecified cirrhosis of liver: Secondary | ICD-10-CM | POA: Diagnosis not present

## 2019-02-02 DIAGNOSIS — K7469 Other cirrhosis of liver: Secondary | ICD-10-CM | POA: Diagnosis not present

## 2019-02-03 ENCOUNTER — Other Ambulatory Visit: Payer: Self-pay | Admitting: *Deleted

## 2019-02-03 MED ORDER — HYDROCODONE-ACETAMINOPHEN 5-325 MG PO TABS: 1.0000 | ORAL_TABLET | Freq: Two times a day (BID) | ORAL | 0 refills | Status: DC | PRN

## 2019-02-03 NOTE — Telephone Encounter (Signed)
Received call from patient.   Requested refill on Hydrocodone/APAP.   Ok to refill??  Last office visit 01/14/2019.  Last refill 01/07/2019.

## 2019-02-14 ENCOUNTER — Ambulatory Visit (INDEPENDENT_AMBULATORY_CARE_PROVIDER_SITE_OTHER): Payer: Medicare Other | Admitting: Licensed Clinical Social Worker

## 2019-02-14 ENCOUNTER — Encounter (HOSPITAL_COMMUNITY): Payer: Self-pay | Admitting: Licensed Clinical Social Worker

## 2019-02-14 ENCOUNTER — Other Ambulatory Visit: Payer: Self-pay

## 2019-02-14 DIAGNOSIS — F332 Major depressive disorder, recurrent severe without psychotic features: Secondary | ICD-10-CM | POA: Diagnosis not present

## 2019-02-14 NOTE — Progress Notes (Signed)
Virtual Visit via Telephone Note  I connected with Kathleen Palmer on 02/14/19 at 11:00 AM EDT by telephone and verified that I am speaking with the correct person using two identifiers.   I discussed the limitations, risks, security and privacy concerns of performing an evaluation and management service by telephone and the availability of in person appointments. I also discussed with the patient that there may be a patient responsible charge related to this service. The patient expressed understanding and agreed to proceed.   History of Present Illness: Major Depressive disorder, recurrent episode, severe with anxious distress.  [place] presents oriented x4 (person, place, situation, and object), distracted, casually groomed, causually dressed and cooperative to address depression. Patient has a history of medical issues and mental health treatment. She denies symptoms of mania. Patient denies suicidal and homicidal ideations. She denies psychosis including auditory and visual hallucinations. Patient admits to cannabis use. She is at low risk for lethality. Patient has been hospitalized 7 times due to suicidal ideations but states she has never attempted due to her religious faith.    Observations/Objective: Physical: [name] pain level has been severe at times. Despite her pain level, she has been getting up and going for a walk several times a week. [name] sleep has been good. Her appetite is good as well.  Spiritual/value: No issues identified. Relationships: Patient indicated that her relationship with her husband is strained but did not go into detail.   Emotional/Mental/Behavior: [name] mood is stable but does note a slight increase in depressive symptoms. Patient is utilizing her coping skills to prevent her mood from getting worse. She is not "giving in" to the depressive thoughts and crying.   Patient engaged in session. She responded well to interventions. Patient continues to meet  criteria for Major Depressive disorder, recurrent episode, severe with anxious distress. Patient will continue in outpatient therapy due to being the least restrictive service to meet her needs. Patient made moderate progress on her goals at this time.   Assessment and Plan: Therapist reviewed patient's recent thoughts and behaviors. Therapist utilized CBT to address depression. Therapist reviewed patient's goals.  Therapist processed patient's feelings to identify triggers for mood. Therapist discussed with patient her coping skills to manage mood and prevent her mood from getting worse.   Suicidal/Homicidal: Negativewithout intent/plan  Follow Up Instructions: Return again in 4 weeks.   I discussed the assessment and treatment plan with the patient. The patient was provided an opportunity to ask questions and all were answered. The patient agreed with the plan and demonstrated an understanding of the instructions.   The patient was advised to call back or seek an in-person evaluation if the symptoms worsen or if the condition fails to improve as anticipated.  I provided 30 minutes of non-face-to-face time during this encounter.   [name], LCSW

## 2019-02-21 ENCOUNTER — Ambulatory Visit (INDEPENDENT_AMBULATORY_CARE_PROVIDER_SITE_OTHER): Payer: Medicare Other | Admitting: Psychiatry

## 2019-02-21 ENCOUNTER — Other Ambulatory Visit: Payer: Self-pay

## 2019-02-21 ENCOUNTER — Encounter (HOSPITAL_COMMUNITY): Payer: Self-pay | Admitting: Psychiatry

## 2019-02-21 DIAGNOSIS — F4310 Post-traumatic stress disorder, unspecified: Secondary | ICD-10-CM

## 2019-02-21 DIAGNOSIS — F332 Major depressive disorder, recurrent severe without psychotic features: Secondary | ICD-10-CM

## 2019-02-21 MED ORDER — ALPRAZOLAM 1 MG PO TABS: 1.0000 mg | ORAL_TABLET | Freq: Three times a day (TID) | ORAL | 2 refills | Status: DC | PRN

## 2019-02-21 MED ORDER — TRAZODONE HCL 100 MG PO TABS: 200.0000 mg | ORAL_TABLET | Freq: Every evening | ORAL | 2 refills | Status: DC | PRN

## 2019-02-21 MED ORDER — FLUOXETINE HCL 20 MG PO CAPS: 20.0000 mg | ORAL_CAPSULE | Freq: Three times a day (TID) | ORAL | 2 refills | Status: DC

## 2019-02-21 NOTE — Progress Notes (Signed)
Virtual Visit via Telephone Note  I connected with Kathleen Palmer on 02/21/19 at  1:20 PM EDT by telephone and verified that I am speaking with the correct person using two identifiers.   I discussed the limitations, risks, security and privacy concerns of performing an evaluation and management service by telephone and the availability of in person appointments. I also discussed with the patient that there may be a patient responsible charge related to this service. The patient expressed understanding and agreed to proceed     I discussed the assessment and treatment plan with the patient. The patient was provided an opportunity to ask questions and all were answered. The patient agreed with the plan and demonstrated an understanding of the instructions.   The patient was advised to call back or seek an in-person evaluation if the symptoms worsen or if the condition fails to improve as anticipated.  I provided 15 minutes of non-face-to-face time during this encounter.   Levonne Spiller, MD  [facility] MD/PA/NP OP Progress Note  02/21/2019 1:45 PM Kathleen Palmer  MRN:  [id number]  Chief Complaint:  Chief Complaint    Depression; Anxiety; Follow-up     HPI: This patient is a 58 year old married white female who lives with her husband in [place].  She is on disability.  She has a history of depression anxiety and posttraumatic stress disorder as well as borderline personality traits.  The patient returns after 4 weeks.  She states for the most part she has been doing okay.  She is disheartened because her husband is drinking more again.  However she speaks to a sponsor from [place] every morning and this helps to keep her grounded.  She has had a few crying spells but is able to get herself back on track.  She is spending a lot of time working on her house.  She denies any serious pain right now from her hernia.  She is still following up with her GI appointments.  She is sleeping well at night and Xanax  continues to help her anxiety.  She denies any thoughts of self-harm or suicidal ideation Visit Diagnosis:    ICD-10-CM   1. Major depressive disorder, recurrent episode, severe with anxious distress ([place])  F33.2   2. PTSD (post-traumatic stress disorder)  F43.10     Past Psychiatric History: Several previous hospitalizations for depression and suicidal ideation, the last 1 being last year  Past Medical History:  Past Medical History:  Diagnosis Date  . Angiomyolipoma    Left kidney  . Anxiety   . Arthritis   . Attention to urostomy [facility])   . Chronic pain   . Depression   . Hepatitis C    ? Contracted through IVDA  . Panic 04/29/1999  . PTSD (post-traumatic stress disorder)   . Substance abuse ([place])    Remote history- cocaine, ETOH, Marijuana  . Thyroid disease     Past Surgical History:  Procedure Laterality Date  . ABDOMINAL HYSTERECTOMY    . ABDOMINAL SURGERY    . BIOPSY  01/27/2017   Procedure: BIOPSY;  Surgeon: Danie Binder, MD;  [facility];  Service: Endoscopy;;  gastric  . CHOLECYSTECTOMY    . COLONOSCOPY WITH PROPOFOL N/A 01/27/2017   Procedure: COLONOSCOPY WITH PROPOFOL;  Surgeon: Danie Binder, MD;  Location: [facility];  Service: Endoscopy;  Laterality: N/A;  12:00pm  . ESOPHAGOGASTRODUODENOSCOPY (EGD) WITH PROPOFOL N/A 01/27/2017   Procedure: ESOPHAGOGASTRODUODENOSCOPY (EGD) WITH PROPOFOL;  Surgeon:  Fields, Marga Melnick, MD;  Location: [facility];  Service: Endoscopy;  Laterality: N/A;  . HERNIA REPAIR     x2- abdomen  . ILEO LOOP CONDUIT    . 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 VAGINA RECONSTRUCTION SURGERY      Family Psychiatric History: see below  Family History:  Family History  Adopted: Yes  Problem Relation Age of Onset  . Depression Mother   . Depression Sister   . Anxiety disorder Sister   . Bipolar disorder  Sister   . Depression Maternal Grandmother   . ADD / ADHD Neg Hx   . Alcohol abuse Neg Hx   . Drug abuse Neg Hx   . Dementia Neg Hx   . OCD Neg Hx   . Paranoid behavior Neg Hx   . Schizophrenia Neg Hx   . Seizures Neg Hx   . Sexual abuse Neg Hx   . Physical abuse Neg Hx   . Suicidality Neg Hx   . Colon cancer Neg Hx     Social History:  Social History   Socioeconomic History  . Marital status: Married    Spouse name: Not on file  . Number of children: 0  . Years of education: Not on file  . Highest education level: Not on file  Occupational History  . Occupation: disability  Social Needs  . Financial resource strain: Not on file  . Food insecurity    Worry: Not on file    Inability: Not on file  . Transportation needs    Medical: Not on file    Non-medical: Not on file  Tobacco Use  . Smoking status: Former Smoker    Packs/day: 0.25    Years: 33.00    Pack years: 8.25    Types: Cigarettes    Quit date: 01/21/2015    Years since quitting: 4.0  . Smokeless tobacco: Never Used  . Tobacco comment: 9-10 cigs a day as of 10/20/2012, (02-07-15 per pt, she stopped smoking 01-21-15)  Substance and Sexual Activity  . Alcohol use: No    Alcohol/week: 0.0 standard drinks  . Drug use: Not Currently    Types: Marijuana    Comment: quit 10 days   . Sexual activity: Yes    Birth control/protection: Surgical  Lifestyle  . Physical activity    Days per week: Not on file    Minutes per session: Not on file  . Stress: Not on file  Relationships  . Social Herbalist on phone: Not on file    Gets together: Not on file    Attends religious service: Not on file    Active member of club or organization: Not on file    Attends meetings of clubs or organizations: Not on file    Relationship status: Not on file  Other Topics Concern  . Not on file  Social History Narrative  . Not on file    Allergies:  Allergies  Allergen Reactions  . Oxycodone Anxiety  .  Abilify [Aripiprazole]     Stiff neck  . Bactrim [Sulfamethoxazole-Trimethoprim]     Tongue swelled  . Gabapentin     Dizziness to the point she actually fell  . Ampicillin Rash    Has patient had a PCN reaction causing immediate rash, facial/tongue/throat swelling, SOB or lightheadedness with hypotension: Unknown Has patient  had a PCN reaction causing severe rash involving mucus membranes or skin necrosis: Unknown Has patient had a PCN reaction that required hospitalization: No Has patient had a PCN reaction occurring within the last 10 years: No If all of the above answers are "NO", then may proceed with Cephalosporin use.      Metabolic Disorder Labs: Lab Results  Component Value Date   HGBA1C 5.2 03/10/2018   MPG 102.54 03/10/2018   MPG 102.54 12/05/2016   No results found for: PROLACTIN Lab Results  Component Value Date   CHOL 123 01/14/2019   TRIG 90 01/14/2019   HDL 36 (L) 01/14/2019   CHOLHDL 3.4 01/14/2019   VLDL 16 03/10/2018   LDLCALC 70 01/14/2019   LDLCALC 84 03/10/2018   Lab Results  Component Value Date   TSH 0.87 01/14/2019   TSH 14.938 (H) 03/10/2018    Therapeutic Level Labs: No results found for: LITHIUM No results found for: VALPROATE No components found for:  CBMZ  Current Medications: Current Outpatient Medications  Medication Sig Dispense Refill  . Acetaminophen-Caffeine (EXCEDRIN ASPIRIN FREE PO) Take 2 tablets by mouth daily as needed.    . ALPRAZolam (XANAX) 1 MG tablet Take 1 tablet (1 mg total) by mouth 3 (three) times daily as needed for anxiety. 90 tablet 2  . Cyanocobalamin (VITAMIN B 12) 100 MCG LOZG Take by mouth.    Marland Kitchen FLUoxetine (PROZAC) 20 MG capsule Take 1 capsule (20 mg total) by mouth 3 (three) times daily. 90 capsule 2  . HYDROcodone-acetaminophen (NORCO) 5-325 MG tablet Take 1 tablet by mouth 2 (two) times daily as needed for moderate pain. 60 tablet 0  . lactulose (CHRONULAC) 10 GM/15ML solution Take 30 mLs (20 g total) by  mouth 2 (two) times daily as needed for mild constipation. 1892 mL 3  . Multiple Vitamins-Minerals (CENTRUM PO) Take 1 tablet by mouth every evening.    . traZODone (DESYREL) 100 MG tablet Take 2 tablets (200 mg total) by mouth at bedtime as needed for sleep. 60 tablet 2   No current facility-administered medications for this visit.      Musculoskeletal: Strength & Muscle Tone: within normal limits Gait & Station: normal Patient leans: N/A  Psychiatric Specialty Exam: Review of Systems  Psychiatric/Behavioral: The patient is nervous/anxious.   All other systems reviewed and are negative.   There were no vitals taken for this visit.There is no height or weight on file to calculate BMI.  General Appearance: NA  Eye Contact:  NA  Speech:  Clear and Coherent  Volume:  Normal  Mood:  Euthymic  Affect:  NA  Thought Process:  Goal Directed  Orientation:  Full (Time, Place, and Person)  Thought Content: Rumination   Suicidal Thoughts:  No  Homicidal Thoughts:  No  Memory:  Immediate;   Good Recent;   Good Remote;   Fair  Judgement:  Fair  Insight:  Fair  Psychomotor Activity:  Decreased  Concentration:  Concentration: Good and Attention Span: Good  Recall:  Good  Fund of Knowledge: Good  Language: Good  Akathisia:  No  Handed:  Right  AIMS (if indicated): not done  Assets:  Communication Skills Desire for Improvement Resilience Social Support Talents/Skills  ADL's:  Intact  Cognition: WNL  Sleep:  Good   Screenings: AIMS     Admission (Discharged) from OP Visit from 03/10/2018 in [facility] Admission (Discharged) from 12/03/2016 in [place] [facility]  AIMS Total  Score  0  0    AUDIT     Admission (Discharged) from OP Visit from 03/10/2018 in [place] [facility] Admission (Discharged) from 12/03/2016 in [facility]  Alcohol Use Disorder Identification  Test Final Score (AUDIT)  0  0    MDI     Office Visit from 11/15/2015 in [facility]  Total Score (max 50)  24    PHQ2-9     Office Visit from 01/14/2019 in [place] Office Visit from 07/07/2018 in [place] Office Visit from 10/27/2017 in [place] Office Visit from 01/19/2017 in [place] Office Visit from 12/10/2016 in [place]  PHQ-2 Total Score  2  0  0  4  4  PHQ-9 Total Score  8  -  -  17  15       Assessment and Plan: This patient is a 58 year old female with a history of PTSD depression and anxiety.  She is doing fairly well on her current regimen.  She will continue Prozac 20 mg 3 times daily for depression, Xanax 1 mg 3 times daily as needed for anxiety and trazodone 200 mg at bedtime for sleep.  She will return to see me in 6 weeks   Levonne Spiller, MD 02/21/2019, 1:45 PM

## 2019-02-26 ENCOUNTER — Telehealth: Payer: Self-pay | Admitting: Gastroenterology

## 2019-02-26 NOTE — Telephone Encounter (Signed)
CONTACT PT FOR [facility], [id number]. NEEDS REPEAT EGD.

## 2019-02-28 ENCOUNTER — Encounter: Payer: Self-pay | Admitting: Gastroenterology

## 2019-02-28 NOTE — Telephone Encounter (Signed)
PATIENT SCHEDULED  °

## 2019-03-07 ENCOUNTER — Other Ambulatory Visit: Payer: Self-pay | Admitting: Family Medicine

## 2019-03-07 MED ORDER — HYDROCODONE-ACETAMINOPHEN 5-325 MG PO TABS: 1.0000 | ORAL_TABLET | Freq: Two times a day (BID) | ORAL | 0 refills | Status: DC | PRN

## 2019-03-07 NOTE — Telephone Encounter (Signed)
Last refilled: 02/03/2019 Last office visit: 01/14/2019

## 2019-03-29 ENCOUNTER — Encounter (HOSPITAL_COMMUNITY): Payer: Self-pay | Admitting: Licensed Clinical Social Worker

## 2019-03-29 ENCOUNTER — Ambulatory Visit (INDEPENDENT_AMBULATORY_CARE_PROVIDER_SITE_OTHER): Payer: Medicare Other | Admitting: Licensed Clinical Social Worker

## 2019-03-29 ENCOUNTER — Ambulatory Visit: Payer: Medicare Other | Admitting: Nurse Practitioner

## 2019-03-29 ENCOUNTER — Other Ambulatory Visit: Payer: Self-pay

## 2019-03-29 DIAGNOSIS — F332 Major depressive disorder, recurrent severe without psychotic features: Secondary | ICD-10-CM

## 2019-03-29 NOTE — Progress Notes (Signed)
Virtual Visit via Telephone Note  I connected with CYBELLE GARVIS on 03/29/19 at 11:00 AM EST by telephone and verified that I am speaking with the correct person using two identifiers.   I discussed the limitations, risks, security and privacy concerns of performing an evaluation and management service by telephone and the availability of in person appointments. I also discussed with the patient that there may be a patient responsible charge related to this service. The patient expressed understanding and agreed to proceed.   History of Present Illness: Major Depressive disorder, recurrent episode, severe with anxious distress.  D[place] presents oriented x4 (person, place, situation, and object), distracted, casually groomed, causually dressed and cooperative to address depression. Patient has a history of medical issues and mental health treatment. She denies symptoms of mania. Patient denies suicidal and homicidal ideations. She denies psychosis including auditory and visual hallucinations. Patient admits to cannabis use. She is at low risk for lethality. Patient has been hospitalized 7 times due to suicidal ideations but states she has never attempted due to her religious faith.    Observations/Objective: Physical: [name] continues to experience pain but is managing. She is feeling exhausted.  Spiritual/value: Patient is reading her bible and praying.  Relationships: Patient is thinking about her past relationships including her mother, brother, and sister. Patient admitted that she has been living in the past for the last several days. She has been dwelling on all the things her mother said to her that were negative such as that she was the "worst child" and "would never amount to anything." Patient acknowledged that her own inner critic reflects the "voice" of her mother. Patient is grieving her sister Aram Beecham. She feels like it was such a shock last year that she was unable to accept the loss but  has this year.    Emotional/Mental/Behavior: [name] mood has been down. She noted that she is allowing herself to feel down instead of repress the feeling so that she can move through it. Patient has continued to get out of bed, open her curtains, do chores around the home, etc despite feeling down. Patient understood that it is ok to cry and that she will get through this episode just as she has every previous one. Patient is working to let go of the past, continue to forgive her family whom hurt her, and live in the present.   Patient engaged in session. She responded well to interventions. Patient continues to meet criteria for Major Depressive disorder, recurrent episode, severe with anxious distress. Patient will continue in outpatient therapy due to being the least restrictive service to meet her needs. Patient made moderate progress on her goals at this time.   Assessment and Plan: Therapist reviewed patient's recent thoughts and behaviors. Therapist utilized CBT to address depression. Therapist reviewed patient's goals.  Therapist processed patient's feelings to identify triggers for mood. Therapist discussed with patient her coping skills to manage mood, her relationships, and steps she is taking to prevent her feelings of depression to escalate.   Suicidal/Homicidal: Negativewithout intent/plan  Follow Up Instructions: Return again in 4 weeks.   I discussed the assessment and treatment plan with the patient. The patient was provided an opportunity to ask questions and all were answered. The patient agreed with the plan and demonstrated an understanding of the instructions.   The patient was advised to call back or seek an in-person evaluation if the symptoms worsen or if the condition fails to improve as anticipated.  I provided  45 minutes of non-face-to-face time during this encounter.   [name], LCSW

## 2019-03-31 ENCOUNTER — Ambulatory Visit: Payer: Medicare Other | Admitting: Nurse Practitioner

## 2019-04-04 ENCOUNTER — Ambulatory Visit (INDEPENDENT_AMBULATORY_CARE_PROVIDER_SITE_OTHER): Payer: Medicare Other | Admitting: Psychiatry

## 2019-04-04 ENCOUNTER — Encounter (HOSPITAL_COMMUNITY): Payer: Self-pay | Admitting: Psychiatry

## 2019-04-04 ENCOUNTER — Other Ambulatory Visit: Payer: Self-pay

## 2019-04-04 ENCOUNTER — Other Ambulatory Visit: Payer: Self-pay | Admitting: *Deleted

## 2019-04-04 DIAGNOSIS — F4310 Post-traumatic stress disorder, unspecified: Secondary | ICD-10-CM

## 2019-04-04 DIAGNOSIS — F332 Major depressive disorder, recurrent severe without psychotic features: Secondary | ICD-10-CM

## 2019-04-04 MED ORDER — HYDROCODONE-ACETAMINOPHEN 5-325 MG PO TABS: 1.0000 | ORAL_TABLET | Freq: Two times a day (BID) | ORAL | 0 refills | Status: DC | PRN

## 2019-04-04 MED ORDER — ALPRAZOLAM 1 MG PO TABS: 1.0000 mg | ORAL_TABLET | Freq: Three times a day (TID) | ORAL | 2 refills | Status: DC | PRN

## 2019-04-04 MED ORDER — FLUOXETINE HCL 20 MG PO CAPS: 20.0000 mg | ORAL_CAPSULE | Freq: Three times a day (TID) | ORAL | 2 refills | Status: DC

## 2019-04-04 MED ORDER — TRAZODONE HCL 100 MG PO TABS: 200.0000 mg | ORAL_TABLET | Freq: Every evening | ORAL | 2 refills | Status: DC | PRN
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
## 2019-04-04 NOTE — Telephone Encounter (Signed)
Received call from patient.   Requested refill on Hydrocodone/APAP.   Ok to refill??  Last office visit 01/14/2019.  Last refill 03/07/2019.

## 2019-04-12 ENCOUNTER — Ambulatory Visit: Payer: Medicare Other | Admitting: Nurse Practitioner

## 2019-04-12 ENCOUNTER — Other Ambulatory Visit: Payer: Self-pay

## 2019-04-12 ENCOUNTER — Ambulatory Visit (INDEPENDENT_AMBULATORY_CARE_PROVIDER_SITE_OTHER): Payer: Medicare Other | Admitting: Licensed Clinical Social Worker

## 2019-04-12 ENCOUNTER — Encounter (HOSPITAL_COMMUNITY): Payer: Self-pay | Admitting: Licensed Clinical Social Worker

## 2019-04-12 DIAGNOSIS — F332 Major depressive disorder, recurrent severe without psychotic features: Secondary | ICD-10-CM | POA: Diagnosis not present
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
## 2019-05-03 ENCOUNTER — Ambulatory Visit (INDEPENDENT_AMBULATORY_CARE_PROVIDER_SITE_OTHER): Payer: Medicare Other | Admitting: Licensed Clinical Social Worker

## 2019-05-03 DIAGNOSIS — F332 Major depressive disorder, recurrent severe without psychotic features: Secondary | ICD-10-CM | POA: Diagnosis not present
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
## 2019-05-06 ENCOUNTER — Other Ambulatory Visit: Payer: Self-pay | Admitting: *Deleted

## 2019-05-06 MED ORDER — HYDROCODONE-ACETAMINOPHEN 5-325 MG PO TABS: 1.0000 | ORAL_TABLET | Freq: Two times a day (BID) | ORAL | 0 refills | Status: DC | PRN

## 2019-05-06 NOTE — Telephone Encounter (Signed)
Received call from patient.   Requested refill on Hydrocodone/APAP.  Ok to refill??  Last office visit 01/14/2019.  Last refill 04/04/2019.

## 2019-05-16 ENCOUNTER — Ambulatory Visit (HOSPITAL_COMMUNITY): Payer: Medicare Other | Admitting: Psychiatry

## 2019-05-16 ENCOUNTER — Other Ambulatory Visit: Payer: Self-pay

## 2019-05-17 ENCOUNTER — Telehealth: Payer: Self-pay | Admitting: *Deleted

## 2019-05-17 NOTE — Telephone Encounter (Signed)
Received call from patient.   Requested refill on Hydrocodone/APAP.  Advised that refill was sent to pharmacy on 05/06/2019. Advised that refill is too early.   States that she does have medication, but she thought it was time for refill.

## 2019-05-19 ENCOUNTER — Ambulatory Visit (INDEPENDENT_AMBULATORY_CARE_PROVIDER_SITE_OTHER): Payer: Medicare Other | Admitting: Psychiatry

## 2019-05-19 ENCOUNTER — Other Ambulatory Visit: Payer: Self-pay

## 2019-05-19 ENCOUNTER — Encounter (HOSPITAL_COMMUNITY): Payer: Self-pay | Admitting: Psychiatry

## 2019-05-19 DIAGNOSIS — F4310 Post-traumatic stress disorder, unspecified: Secondary | ICD-10-CM

## 2019-05-19 DIAGNOSIS — F332 Major depressive disorder, recurrent severe without psychotic features: Secondary | ICD-10-CM | POA: Diagnosis not present

## 2019-05-19 MED ORDER — TRAZODONE HCL 100 MG PO TABS: 100.0000 mg | ORAL_TABLET | Freq: Every evening | ORAL | 2 refills | Status: DC | PRN

## 2019-05-19 MED ORDER — ALPRAZOLAM 1 MG PO TABS: 1.0000 mg | ORAL_TABLET | Freq: Three times a day (TID) | ORAL | 2 refills | Status: DC | PRN

## 2019-05-19 MED ORDER — FLUOXETINE HCL 20 MG PO CAPS: 20.0000 mg | ORAL_CAPSULE | Freq: Three times a day (TID) | ORAL | 2 refills | Status: DC

## 2019-05-19 NOTE — Progress Notes (Signed)
Virtual Visit via Telephone Note  I connected with Kathleen Palmer on 05/19/19 at 10:00 AM EST by telephone and verified that I am speaking with the correct person using two identifiers.   I discussed the limitations, risks, security and privacy concerns of performing an evaluation and management service by telephone and the availability of in person appointments. I also discussed with the patient that there may be a patient responsible charge related to this service. The patient expressed understanding and agreed to proceed.    I discussed the assessment and treatment plan with the patient. The patient was provided an opportunity to ask questions and all were answered. The patient agreed with the plan and demonstrated an understanding of the instructions.   The patient was advised to call back or seek an in-person evaluation if the symptoms worsen or if the condition fails to improve as anticipated.  I provided 15 minutes of non-face-to-face time during this encounter.   Levonne Spiller, MD  [facility] MD/PA/NP OP Progress Note  05/19/2019 10:46 AM Kathleen Palmer  MRN:  [id number]  Chief Complaint:  Chief Complaint    Depression; Anxiety; Follow-up     HPI: This patient is a 60 year old married white female who lives with her husband in [place].  She is on disability.  She has a history of depression anxiety and posttraumatic stress disorder as well as borderline personality traits.  The patient returns after 6 weeks.  She states that overall she is doing okay but she has been somewhat stressed by her family.  She allowed her niece to visit for Christmas and she states her niece was "high on drugs" and became angry belligerent and paranoid.  She ended up having to call the police to remove her.  She is also very worried about her cousin who has schizophrenia and lives in [place].  He has been moved from his most recent placement and she does not know how to find him.  She realizes it is not much  more she can do about the situations.  She is trying to stay busy doing cleaning and other projects in her home.  She states that overall her mood is stable and she denies serious depression or suicidal ideation.  The trazodone is helping her sleep but makes her feel very zoned out in the morning so we will cut down the dose.  The Xanax continues to help her anxiety Visit Diagnosis:    ICD-10-CM   1. Major depressive disorder, recurrent episode, severe with anxious distress ([place])  F33.2   2. PTSD (post-traumatic stress disorder)  F43.10     Past Psychiatric History: Several previous hospitalizations for depression and suicidal ideation, the last 1 being 2 years ago  Past Medical History:  Past Medical History:  Diagnosis Date  . Angiomyolipoma    Left kidney  . Anxiety   . Arthritis   . Attention to urostomy [facility])   . Chronic pain   . Depression   . Hepatitis C    ? Contracted through IVDA  . Panic 04/29/1999  . PTSD (post-traumatic stress disorder)   . Substance abuse ([place])    Remote history- cocaine, ETOH, Marijuana  . Thyroid disease     Past Surgical History:  Procedure Laterality Date  . ABDOMINAL HYSTERECTOMY    . ABDOMINAL SURGERY    . BIOPSY  01/27/2017   Procedure: BIOPSY;  Surgeon: Danie Binder, MD;  [facility];  Service: Endoscopy;;  gastric  . CHOLECYSTECTOMY    .  COLONOSCOPY WITH PROPOFOL N/A 01/27/2017   Procedure: COLONOSCOPY WITH PROPOFOL;  Surgeon: Danie Binder, MD;  [facility];  Service: Endoscopy;  Laterality: N/A;  12:00pm  . ESOPHAGOGASTRODUODENOSCOPY (EGD) WITH PROPOFOL N/A 01/27/2017   Procedure: ESOPHAGOGASTRODUODENOSCOPY (EGD) WITH PROPOFOL;  Surgeon: Danie Binder, MD;  [facility];  Service: Endoscopy;  Laterality: N/A;  . HERNIA REPAIR     x2- abdomen  . ILEO LOOP CONDUIT    . 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 orthopedic surgeries     multiple due to congenital  abnormalities, pelvic deformities  . VAGINA RECONSTRUCTION SURGERY      Family Psychiatric History: see below  Family History:  Family History  Adopted: Yes  Problem Relation Age of Onset  . Depression Mother   . Depression Sister   . Anxiety disorder Sister   . Bipolar disorder Sister   . Depression Maternal Grandmother   . ADD / ADHD Neg Hx   . Alcohol abuse Neg Hx   . Drug abuse Neg Hx   . Dementia Neg Hx   . OCD Neg Hx   . Paranoid behavior Neg Hx   . Schizophrenia Neg Hx   . Seizures Neg Hx   . Sexual abuse Neg Hx   . Physical abuse Neg Hx   . Suicidality Neg Hx   . Colon cancer Neg Hx     Social History:  Social History   Socioeconomic History  . Marital status: Married    Spouse name: Not on file  . Number of children: 0  . Years of education: Not on file  . Highest education level: Not on file  Occupational History  . Occupation: disability  Tobacco Use  . Smoking status: Former Smoker    Packs/day: 0.25    Years: 33.00    Pack years: 8.25    Types: Cigarettes    Quit date: 01/21/2015    Years since quitting: 4.3  . Smokeless tobacco: Never Used  . Tobacco comment: 9-10 cigs a day as of 10/20/2012, (02-07-15 per pt, she stopped smoking 01-21-15)  Substance and Sexual Activity  . Alcohol use: No    Alcohol/week: 0.0 standard drinks  . Drug use: Not Currently    Types: Marijuana    Comment: quit 10 days   . Sexual activity: Yes    Birth control/protection: Surgical  Other Topics Concern  . Not on file  Social History Narrative  . Not on file   Social Determinants of Health   Financial Resource Strain:   . Difficulty of Paying Living Expenses: Not on file  Food Insecurity:   . Worried About Charity fundraiser in the Last Year: Not on file  . Ran Out of Food in the Last Year: Not on file  Transportation Needs:   . Lack of Transportation (Medical): Not on file  . Lack of Transportation (Non-Medical): Not on file  Physical Activity:   . Days  of Exercise per Week: Not on file  . Minutes of Exercise per Session: Not on file  Stress:   . Feeling of Stress : Not on file  Social Connections:   . Frequency of Communication with Friends and Family: Not on file  . Frequency of Social Gatherings with Friends and Family: Not on file  . Attends Religious Services: Not on file  . Active Member of Clubs or Organizations: Not on file  . Attends Archivist Meetings:  Not on file  . Marital Status: Not on file    Allergies:  Allergies  Allergen Reactions  . Oxycodone Anxiety  . Abilify [Aripiprazole]     Stiff neck  . Bactrim [Sulfamethoxazole-Trimethoprim]     Tongue swelled  . Gabapentin     Dizziness to the point she actually fell  . Ampicillin Rash    Has patient had a PCN reaction causing immediate rash, facial/tongue/throat swelling, SOB or lightheadedness with hypotension: Unknown Has patient had a PCN reaction causing severe rash involving mucus membranes or skin necrosis: Unknown Has patient had a PCN reaction that required hospitalization: No Has patient had a PCN reaction occurring within the last 10 years: No If all of the above answers are "NO", then may proceed with Cephalosporin use.      Metabolic Disorder Labs: Lab Results  Component Value Date   HGBA1C 5.2 03/10/2018   MPG 102.54 03/10/2018   MPG 102.54 12/05/2016   No results found for: PROLACTIN Lab Results  Component Value Date   CHOL 123 01/14/2019   TRIG 90 01/14/2019   HDL 36 (L) 01/14/2019   CHOLHDL 3.4 01/14/2019   VLDL 16 03/10/2018   LDLCALC 70 01/14/2019   LDLCALC 84 03/10/2018   Lab Results  Component Value Date   TSH 0.87 01/14/2019   TSH 14.938 (H) 03/10/2018    Therapeutic Level Labs: No results found for: LITHIUM No results found for: VALPROATE No components found for:  CBMZ  Current Medications: Current Outpatient Medications  Medication Sig Dispense Refill  . Acetaminophen-Caffeine (EXCEDRIN ASPIRIN FREE PO)  Take 2 tablets by mouth daily as needed.    . ALPRAZolam (XANAX) 1 MG tablet Take 1 tablet (1 mg total) by mouth 3 (three) times daily as needed for anxiety. 90 tablet 2  . Cyanocobalamin (VITAMIN B 12) 100 MCG LOZG Take by mouth.    Marland Kitchen FLUoxetine (PROZAC) 20 MG capsule Take 1 capsule (20 mg total) by mouth 3 (three) times daily. 90 capsule 2  . HYDROcodone-acetaminophen (NORCO) 5-325 MG tablet Take 1 tablet by mouth 2 (two) times daily as needed for moderate pain. 60 tablet 0  . lactulose (CHRONULAC) 10 GM/15ML solution Take 30 mLs (20 g total) by mouth 2 (two) times daily as needed for mild constipation. 1892 mL 3  . Multiple Vitamins-Minerals (CENTRUM PO) Take 1 tablet by mouth every evening.    . traZODone (DESYREL) 100 MG tablet Take 1 tablet (100 mg total) by mouth at bedtime as needed for sleep. 30 tablet 2   No current facility-administered medications for this visit.     Musculoskeletal: Strength & Muscle Tone: within normal limits Gait & Station: normal Patient leans: N/A  Psychiatric Specialty Exam: Review of Systems  Gastrointestinal: Positive for abdominal pain.  Psychiatric/Behavioral: The patient is nervous/anxious.   All other systems reviewed and are negative.   There were no vitals taken for this visit.There is no height or weight on file to calculate BMI.  General Appearance: NA  Eye Contact:  NA  Speech:  Clear and Coherent  Volume:  Normal  Mood:  Anxious  Affect:  NA  Thought Process:  Goal Directed  Orientation:  Full (Time, Place, and Person)  Thought Content: Rumination   Suicidal Thoughts:  No  Homicidal Thoughts:  No  Memory:  Immediate;   Good Recent;   Good Remote;   Good  Judgement:  Fair  Insight:  Fair  Psychomotor Activity:  Decreased  Concentration:  Concentration: Good and  Attention Span: Good  Recall:  Good  Fund of Knowledge: Good  Language: Good  Akathisia:  No  Handed:  Right  AIMS (if indicated): not done  Assets:  Communication  Skills Desire for Improvement Resilience Social Support Talents/Skills  ADL's:  Intact  Cognition: WNL  Sleep:  Good   Screenings: AIMS     Admission (Discharged) from OP Visit from 03/10/2018 in [facility] Admission (Discharged) from 12/03/2016 in [facility]  AIMS Total Score  0  0    AUDIT     Admission (Discharged) from OP Visit from 03/10/2018 in [facility] Admission (Discharged) from 12/03/2016 in [facility]  Alcohol Use Disorder Identification Test Final Score (AUDIT)  0  0    MDI     Office Visit from 11/15/2015 in [facility]  Total Score (max 50)  24    PHQ2-9     Office Visit from 01/14/2019 in [facility] [facility] Office Visit from 07/07/2018 in [place] Office Visit from 10/27/2017 in [facility] Office Visit from 01/19/2017 in [facility] Office Visit from 12/10/2016 in [facility]  PHQ-2 Total Score  2  0  0  4  4  PHQ-9 Total Score  8  --  --  17  15       Assessment and Plan: This patient is a 59 year old female with a history of PTSD depression and anxiety.  She continues to do fairly well on her current regimen.  She will continue Prozac 20 mg 3 times daily for depression, Xanax 1 mg 3 times daily as needed for anxiety.  She will decrease trazodone 100 mg at bedtime for sleep.  She will return to see me in 6 weeks and she will be scheduled with a new therapist here.   Levonne Spiller, MD 05/19/2019, 10:46 AM

## 2019-05-30 ENCOUNTER — Ambulatory Visit: Payer: Medicare Other | Admitting: Nurse Practitioner

## 2019-06-02 ENCOUNTER — Other Ambulatory Visit: Payer: Self-pay

## 2019-06-02 ENCOUNTER — Ambulatory Visit (INDEPENDENT_AMBULATORY_CARE_PROVIDER_SITE_OTHER): Payer: Medicare Other | Admitting: Clinical

## 2019-06-02 DIAGNOSIS — F4310 Post-traumatic stress disorder, unspecified: Secondary | ICD-10-CM

## 2019-06-02 DIAGNOSIS — F332 Major depressive disorder, recurrent severe without psychotic features: Secondary | ICD-10-CM
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
## 2019-06-06 ENCOUNTER — Other Ambulatory Visit: Payer: Self-pay | Admitting: *Deleted

## 2019-06-06 MED ORDER — HYDROCODONE-ACETAMINOPHEN 5-325 MG PO TABS: 1.0000 | ORAL_TABLET | Freq: Two times a day (BID) | ORAL | 0 refills | Status: DC | PRN

## 2019-06-06 NOTE — Telephone Encounter (Signed)
Received call from patient.   Requested refill on Hydrocodone/APAP.   Ok to refill??  Last office visit 01/14/2019.  Last refill 05/06/2019.

## 2019-06-20 ENCOUNTER — Ambulatory Visit: Payer: Medicare Other | Admitting: Nurse Practitioner

## 2019-06-20 NOTE — Progress Notes (Deleted)
Referring Provider: Alycia Rossetti, MD Primary Care Physician:  Alycia Rossetti, MD Primary GI:  Dr.   Rayne Du chief complaint on file.   HPI:   Kathleen Palmer is a 59 y.o. female who presents for follow-up on cirrhosis and to repeat EGD.  The patient was last seen in our office 12/30/2016 for hepatitis C, constipation, liver fibrosis.  Previously diagnosed with hepatitis C initially 1996.  Ultrasound at time of fatty liver disease.  At that time noted no previous colonoscopy or EGD and refused colonoscopy given history of multiple pelvic surgeries/complications/ileal loop conduit.  History of anxiety and depression managed by psychiatry.  History of remote IV drug use/cocaine but none since the 1980s.  Multiple drug screens negative for cocaine and no history of alcohol since 2004.  Previous recommendations to follow-up with hepatology liver clinic in [place].  At that time also recommended follow-up in 6 months for fibrosis score of F3 to F4.  Previous admission for suicidal ideations with urine drug screen positive for benzodiazepines and THC.  Previous platelet count of 114 and mild transaminitis but normal alkaline phosphatase and bilirubin.  Meld score of 9, child Pugh class A.  Hepatitis C RNA during admission also persistently positive.  CT of the abdomen and pelvis with no focal liver abnormality, cholecystectomy with chronic intrahepatic bile duct prominence, mild surface lobulation of the liver, no splenomegaly.  At her last visit she noted she was off opiates, having constipation and chronic abdominal pain related to past surgical history.  No hematochezia or melena.  No overt hepatic symptoms.  Admitted THC use but qualifies with "I am from [place] where it is legal."  Recommended updated labs, Linzess 72 mcg, follow-up in 1 to 2 weeks, colonoscopy and EGD, referral to liver clinic in [place], follow-up in 3 months.  Reviewed last note from [facility] liver care clinic dated  01/12/2019.  At that time noted history of hepatitis C completed 12 weeks of Harvoni with undetectable RNA at week 4 and end of treatment.  Improvement in transaminitis during therapy.  However at 12 weeks following treatment her RNA was detectable at 69,000 with mild elevated transaminases, denied missing any doses.  Liver elastography in 2015 reported F3 to F4.  EGD in 2018 with no changes and associated portal hypertension.  Most recent CT with continued cirrhosis and splenomegaly.  Most recent platelet count at 57,000.  Recent meld score 8, child Pugh class A.  Synthetic function overall appears well preserved.  Patient currently due for hepatoma screening and recommended right upper quadrant ultrasound.  No ascites.  2 years since last EGD and needs a repeat.  Apparently she follows with [facility] abdominal/transplant surgery.  Work-up ongoing for possible NS 5 a inhibitor-containing regimen for 24 weeks, and weight-based ribavirin based on previous failure of treatment.  Today she states   Past Medical History:  Diagnosis Date  . Angiomyolipoma    Left kidney  . Anxiety   . Arthritis   . Attention to urostomy [facility])   . Chronic pain   . Depression   . Hepatitis C    ? Contracted through IVDA  . Panic 04/29/1999  . PTSD (post-traumatic stress disorder)   . Substance abuse ([place])    Remote history- cocaine, ETOH, Marijuana  . Thyroid disease     Past Surgical History:  Procedure Laterality Date  . ABDOMINAL HYSTERECTOMY    . ABDOMINAL SURGERY    . BIOPSY  01/27/2017   Procedure: BIOPSY;  Surgeon: Danie Binder, MD;  Location: [facility];  Service: Endoscopy;;  gastric  . CHOLECYSTECTOMY    . COLONOSCOPY WITH PROPOFOL N/A 01/27/2017   Procedure: COLONOSCOPY WITH PROPOFOL;  Surgeon: Danie Binder, MD;  Location: [facility];  Service: Endoscopy;  Laterality: N/A;  12:00pm  . ESOPHAGOGASTRODUODENOSCOPY (EGD) WITH PROPOFOL N/A 01/27/2017   Procedure: ESOPHAGOGASTRODUODENOSCOPY (EGD) WITH  PROPOFOL;  Surgeon: Danie Binder, MD;  Location: [facility];  Service: Endoscopy;  Laterality: N/A;  . HERNIA REPAIR     x2- abdomen  . ILEO LOOP CONDUIT    . 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 VAGINA RECONSTRUCTION SURGERY      Current Outpatient Medications  Medication Sig Dispense Refill  . Acetaminophen-Caffeine (EXCEDRIN ASPIRIN FREE PO) Take 2 tablets by mouth daily as needed.    . ALPRAZolam (XANAX) 1 MG tablet Take 1 tablet (1 mg total) by mouth 3 (three) times daily as needed for anxiety. 90 tablet 2  . Cyanocobalamin (VITAMIN B 12) 100 MCG LOZG Take by mouth.    Marland Kitchen FLUoxetine (PROZAC) 20 MG capsule Take 1 capsule (20 mg total) by mouth 3 (three) times daily. 90 capsule 2  . HYDROcodone-acetaminophen (NORCO) 5-325 MG tablet Take 1 tablet by mouth 2 (two) times daily as needed for moderate pain. 60 tablet 0  . lactulose (CHRONULAC) 10 GM/15ML solution Take 30 mLs (20 g total) by mouth 2 (two) times daily as needed for mild constipation. 1892 mL 3  . Multiple Vitamins-Minerals (CENTRUM PO) Take 1 tablet by mouth every evening.    . traZODone (DESYREL) 100 MG tablet Take 1 tablet (100 mg total) by mouth at bedtime as needed for sleep. 30 tablet 2   No current facility-administered medications for this visit.    Allergies as of 06/20/2019 - Review Complete 05/19/2019  Allergen Reaction Noted  . Oxycodone Anxiety 03/05/2012  . Abilify [aripiprazole]  06/08/2013  . Bactrim [sulfamethoxazole-trimethoprim]  12/01/2011  . Gabapentin  12/01/2011  . Ampicillin Rash 12/01/2011    Family History  Adopted: Yes  Problem Relation Age of Onset  . Depression Mother   . Depression Sister   . Anxiety disorder Sister   . Bipolar disorder Sister   . Depression Maternal Grandmother   . ADD / ADHD Neg Hx   . Alcohol abuse Neg Hx   . Drug abuse Neg Hx   . Dementia  Neg Hx   . OCD Neg Hx   . Paranoid behavior Neg Hx   . Schizophrenia Neg Hx   . Seizures Neg Hx   . Sexual abuse Neg Hx   . Physical abuse Neg Hx   . Suicidality Neg Hx   . Colon cancer Neg Hx     Social History   Socioeconomic History  . Marital status: Married    Spouse name: Not on file  . Number of children: 0  . Years of education: Not on file  . Highest education level: Not on file  Occupational History  . Occupation: disability  Tobacco Use  . Smoking status: Former Smoker    Packs/day: 0.25    Years: 33.00    Pack years: 8.25    Types: Cigarettes    Quit date: 01/21/2015    Years since quitting: 4.4  . Smokeless tobacco: Never Used  . Tobacco comment: 9-10 cigs a day as  of 10/20/2012, (02-07-15 per pt, she stopped smoking 01-21-15)  Substance and Sexual Activity  . Alcohol use: No    Alcohol/week: 0.0 standard drinks  . Drug use: Not Currently    Types: Marijuana    Comment: quit 10 days   . Sexual activity: Yes    Birth control/protection: Surgical  Other Topics Concern  . Not on file  Social History Narrative  . Not on file   Social Determinants of Health   Financial Resource Strain:   . Difficulty of Paying Living Expenses: Not on file  Food Insecurity:   . Worried About Charity fundraiser in the Last Year: Not on file  . Ran Out of Food in the Last Year: Not on file  Transportation Needs:   . Lack of Transportation (Medical): Not on file  . Lack of Transportation (Non-Medical): Not on file  Physical Activity:   . Days of Exercise per Week: Not on file  . Minutes of Exercise per Session: Not on file  Stress:   . Feeling of Stress : Not on file  Social Connections:   . Frequency of Communication with Friends and Family: Not on file  . Frequency of Social Gatherings with Friends and Family: Not on file  . Attends Religious Services: Not on file  . Active Member of Clubs or Organizations: Not on file  . Attends Archivist Meetings: Not  on file  . Marital Status: Not on file    Review of Systems: General: Negative for anorexia, weight loss, fever, chills, fatigue, weakness. Eyes: Negative for vision changes.  ENT: Negative for hoarseness, difficulty swallowing , nasal congestion. CV: Negative for chest pain, angina, palpitations, dyspnea on exertion, peripheral edema.  Respiratory: Negative for dyspnea at rest, dyspnea on exertion, cough, sputum, wheezing.  GI: See history of present illness. GU:  Negative for dysuria, hematuria, urinary incontinence, urinary frequency, nocturnal urination.  MS: Negative for joint pain, low back pain.  Derm: Negative for rash or itching.  Neuro: Negative for weakness, abnormal sensation, seizure, frequent headaches, memory loss, confusion.  Psych: Negative for anxiety, depression, suicidal ideation, hallucinations.  Endo: Negative for unusual weight change.  Heme: Negative for bruising or bleeding. Allergy: Negative for rash or hives.   Physical Exam: There were no vitals taken for this visit. General:   Alert and oriented. Pleasant and cooperative. Well-nourished and well-developed.  Head:  Normocephalic and atraumatic. Eyes:  Without icterus, sclera clear and conjunctiva pink.  Ears:  Normal auditory acuity. Mouth:  No deformity or lesions, oral mucosa pink.  Throat/Neck:  Supple, without mass or thyromegaly. Cardiovascular:  S1, S2 present without murmurs appreciated. Normal pulses noted. Extremities without clubbing or edema. Respiratory:  Clear to auscultation bilaterally. No wheezes, rales, or rhonchi. No distress.  Gastrointestinal:  +BS, soft, non-tender and non-distended. No HSM noted. No guarding or rebound. No masses appreciated.  Rectal:  Deferred  Musculoskalatal:  Symmetrical without gross deformities. Normal posture. Skin:  Intact without significant lesions or rashes. Neurologic:  Alert and oriented x4;  grossly normal neurologically. Psych:  Alert and  cooperative. Normal mood and affect. Heme/Lymph/Immune: No significant cervical adenopathy. No excessive bruising noted.    06/20/2019 7:56 AM   Disclaimer: This note was dictated with voice recognition software. Similar sounding words can inadvertently be transcribed and may not be corrected upon review.

## 2019-06-22 ENCOUNTER — Other Ambulatory Visit: Payer: Self-pay

## 2019-06-22 ENCOUNTER — Ambulatory Visit (HOSPITAL_COMMUNITY): Payer: Medicare Other | Admitting: Clinical

## 2019-06-25 ENCOUNTER — Other Ambulatory Visit (HOSPITAL_COMMUNITY): Payer: Self-pay | Admitting: Psychiatry

## 2019-06-29 DIAGNOSIS — K7290 Hepatic failure, unspecified without coma: Secondary | ICD-10-CM | POA: Diagnosis not present

## 2019-06-29 DIAGNOSIS — K7469 Other cirrhosis of liver: Secondary | ICD-10-CM | POA: Diagnosis not present

## 2019-06-29 DIAGNOSIS — B182 Chronic viral hepatitis C: Secondary | ICD-10-CM | POA: Diagnosis not present

## 2019-06-30 ENCOUNTER — Other Ambulatory Visit: Payer: Self-pay | Admitting: Nurse Practitioner

## 2019-06-30 ENCOUNTER — Other Ambulatory Visit (HOSPITAL_COMMUNITY): Payer: Self-pay | Admitting: Nurse Practitioner

## 2019-06-30 DIAGNOSIS — K7469 Other cirrhosis of liver: Secondary | ICD-10-CM

## 2019-07-04 ENCOUNTER — Other Ambulatory Visit: Payer: Self-pay | Admitting: *Deleted

## 2019-07-04 ENCOUNTER — Ambulatory Visit (HOSPITAL_COMMUNITY): Payer: Medicare Other | Admitting: Psychiatry

## 2019-07-04 MED ORDER — HYDROCODONE-ACETAMINOPHEN 5-325 MG PO TABS: 1.0000 | ORAL_TABLET | Freq: Two times a day (BID) | ORAL | 0 refills | Status: DC | PRN

## 2019-07-04 NOTE — Telephone Encounter (Signed)
Received call from patient.   Requested refill on Hydrocodone/APAP.   Ok to refill??  Last office visit 01/14/2019.  Last refill 06/06/2019.

## 2019-07-06 ENCOUNTER — Other Ambulatory Visit: Payer: Self-pay

## 2019-07-06 ENCOUNTER — Ambulatory Visit (INDEPENDENT_AMBULATORY_CARE_PROVIDER_SITE_OTHER): Payer: Medicare Other | Admitting: Clinical

## 2019-07-06 DIAGNOSIS — F4310 Post-traumatic stress disorder, unspecified: Secondary | ICD-10-CM

## 2019-07-06 DIAGNOSIS — F332 Major depressive disorder, recurrent severe without psychotic features: Secondary | ICD-10-CM | POA: Diagnosis not present

## 2019-07-06 NOTE — Progress Notes (Signed)
Virtual Visit via Telephone Note  I connected with SHERRONDA WADDELL on 07/06/19 at 11:00 AM EST by telephone and verified that I am speaking with the correct person using two identifiers.  Location: Patient: Home Provider: Office   I discussed the limitations, risks, security and privacy concerns of performing an evaluation and management service by telephone and the availability of in person appointments. I also discussed with the patient that there may be a patient responsible charge related to this service. The patient expressed understanding and agreed to proceed.     THERAPIST PROGRESS NOTE  Session Time: 11:00AM-11:30AM  Participation Level: Active  Behavioral Response: CasualAlertDepressed  Type of Therapy: Individual Therapy  Treatment Goals addressed: Coping  Interventions: CBT  Summary: REIGNA FARON is a 59 y.o. female who presents with Depression and Anxiety.The OPT therapist worked with the patient for her initial session with new therapist. The OPT therapist utilized Motivational Interviewing to assist in creating therapeutic repore. The patient in the session was engaged and work in collaboration giving feedback about her triggers and symptoms over the past few weeks. The OPT therapist utilized Cognitive Behavioral Therapy through cognitive restructuring as well as worked with the patient on coping strategies to assist in management of mood and anxiety. The OPT therapist inquired for holistic care about the patients adherence to medication therapy.  Suicidal/Homicidal: Nowithout intent/plan  Therapist Response: The OPT therapist worked with the patient for the patients initial scheduled session. The patient was engaged in her session and gave feedback in relation to triggers, symptoms, and behavior responses over the past few weeks. The OPT therapist worked with the patient utilizing an in session Cognitive Behavioral Therapy exercise. The patient was responsive in the  session and verbalized, " I am working on pushing forward even when my symptoms are difficult, I am trying to work my way out of what I call the wormhole". The patient indicated compliance, but concern around effectiveness in relation to her current medication therapy noting her intent t speak to the prescriber about making changes.. The OPT therapist will continue treatment work with the patient in her next scheduled session.  Plan: Return again in 2 weeks.  Diagnosis: Axis I: Major depressive disorder, recurrent episode, severe with anxious distress and PTSD (post-traumatic stress disorder)    Axis II: No diagnosis  I discussed the assessment and treatment plan with the patient. The patient was provided an opportunity to ask questions and all were answered. The patient agreed with the plan and demonstrated an understanding of the instructions.   The patient was advised to call back or seek an in-person evaluation if the symptoms worsen or if the condition fails to improve as anticipated.  I provided 30 minutes of non-face-to-face time during this encounter.   Lennox Grumbles, LCSW 07/06/2019

## 2019-07-07 ENCOUNTER — Ambulatory Visit (HOSPITAL_COMMUNITY): Admission: RE | Admit: 2019-07-07 | Payer: Medicare Other | Source: Ambulatory Visit

## 2019-07-07 ENCOUNTER — Encounter (HOSPITAL_COMMUNITY): Payer: Self-pay

## 2019-07-15 ENCOUNTER — Ambulatory Visit: Payer: Medicare Other | Admitting: Family Medicine

## 2019-07-20 ENCOUNTER — Ambulatory Visit (HOSPITAL_COMMUNITY): Payer: Medicare Other

## 2019-07-22 ENCOUNTER — Ambulatory Visit (HOSPITAL_COMMUNITY): Payer: Medicare Other

## 2019-07-25 ENCOUNTER — Encounter (HOSPITAL_COMMUNITY): Payer: Self-pay | Admitting: Psychiatry

## 2019-07-25 ENCOUNTER — Other Ambulatory Visit: Payer: Self-pay

## 2019-07-25 ENCOUNTER — Ambulatory Visit (INDEPENDENT_AMBULATORY_CARE_PROVIDER_SITE_OTHER): Payer: Medicare Other | Admitting: Psychiatry

## 2019-07-25 DIAGNOSIS — Z87891 Personal history of nicotine dependence: Secondary | ICD-10-CM

## 2019-07-25 DIAGNOSIS — F4310 Post-traumatic stress disorder, unspecified: Secondary | ICD-10-CM

## 2019-07-25 DIAGNOSIS — F332 Major depressive disorder, recurrent severe without psychotic features: Secondary | ICD-10-CM

## 2019-07-25 MED ORDER — ALPRAZOLAM 1 MG PO TABS: 1.0000 mg | ORAL_TABLET | Freq: Three times a day (TID) | ORAL | 2 refills | Status: DC | PRN

## 2019-07-25 MED ORDER — TRAZODONE HCL 100 MG PO TABS: 100.0000 mg | ORAL_TABLET | Freq: Every evening | ORAL | 1 refills | Status: DC | PRN

## 2019-07-25 MED ORDER — FLUOXETINE HCL 20 MG PO CAPS: 20.0000 mg | ORAL_CAPSULE | Freq: Three times a day (TID) | ORAL | 2 refills | Status: DC

## 2019-07-25 NOTE — Progress Notes (Signed)
Virtual Visit via Telephone Note  I connected with Kathleen Palmer on 07/25/19 at 11:00 AM EDT by telephone and verified that I am speaking with the correct person using two identifiers.   I discussed the limitations, risks, security and privacy concerns of performing an evaluation and management service by telephone and the availability of in person appointments. I also discussed with the patient that there may be a patient responsible charge related to this service. The patient expressed understanding and agreed to proceed.     I discussed the assessment and treatment plan with the patient. The patient was provided an opportunity to ask questions and all were answered. The patient agreed with the plan and demonstrated an understanding of the instructions.   The patient was advised to call back or seek an in-person evaluation if the symptoms worsen or if the condition fails to improve as anticipated.  I provided 15 minutes of non-face-to-face time during this encounter.   Levonne Spiller, MD  [facility] MD/PA/NP OP Progress Note  07/25/2019 11:18 AM Kathleen Palmer  MRN:  [id number]  Chief Complaint:  Chief Complaint    Depression; Follow-up     HPI: This patient is a 59 year old married white female who lives with her husband in [place].  She is on disability.  She has a history of depression, anxiety and posttraumatic stress disorder as well as borderline personality traits.  The patient returns after 6 weeks.  She states that she is very worried about her niece who has been abusing drugs and being coming volatile and difficult.  She allowed the girl to come to her home last Christmas but had to ask her to leave because of her behavior.  Apparently now the girls father is very upset about the behavior as well and is looking to commit the girl.  Somehow the patient think she could have done more to help her but we spoke at length about how people using substances are not going to get better until  they make a decision to get off the drugs.  The patient states that she has been more depressed lately and spending a lot of time in bed.  She has also canceled appointments to follow-up on her GI issues such as her hernia and hepatitis C.  We also spoke at length about her medication treatment and she always goes back to Prozac no matter what I switch her to.  She agreed at present to stay on her current medications and try to make more of an effort to get out of bed and take care of herself.  She denies any thoughts of self-harm or suicide.  She has been sleeping well at night and the Xanax continues to help her anxiety.  She is continuing her therapy here with Maye Hides Visit Diagnosis:    ICD-10-CM   1. Major depressive disorder, recurrent episode, severe with anxious distress ([place])  F33.2   2. PTSD (post-traumatic stress disorder)  F43.10     Past Psychiatric History: Several previous hospitalizations for depression and suicidal ideation, the last one being 2 years ago  Past Medical History:  Past Medical History:  Diagnosis Date  . Angiomyolipoma    Left kidney  . Anxiety   . Arthritis   . Attention to urostomy [facility])   . Chronic pain   . Depression   . Hepatitis C    ? Contracted through IVDA  . Panic 04/29/1999  . PTSD (post-traumatic stress disorder)   . Substance abuse (  [place])    Remote history- cocaine, ETOH, Marijuana  . Thyroid disease     Past Surgical History:  Procedure Laterality Date  . ABDOMINAL HYSTERECTOMY    . ABDOMINAL SURGERY    . BIOPSY  01/27/2017   Procedure: BIOPSY;  Surgeon: Danie Binder, MD;  [facility];  Service: Endoscopy;;  gastric  . CHOLECYSTECTOMY    . COLONOSCOPY WITH PROPOFOL N/A 01/27/2017   Procedure: COLONOSCOPY WITH PROPOFOL;  Surgeon: Danie Binder, MD;  [facility];  Service: Endoscopy;  Laterality: N/A;  12:00pm  . ESOPHAGOGASTRODUODENOSCOPY (EGD) WITH PROPOFOL N/A 01/27/2017   Procedure:  ESOPHAGOGASTRODUODENOSCOPY (EGD) WITH PROPOFOL;  Surgeon: Danie Binder, MD;  [facility];  Service: Endoscopy;  Laterality: N/A;  . HERNIA REPAIR     x2- abdomen  . ILEO LOOP CONDUIT    . 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 VAGINA RECONSTRUCTION SURGERY      Family Psychiatric History: see below  Family History:  Family History  Adopted: Yes  Problem Relation Age of Onset  . Depression Mother   . Depression Sister   . Anxiety disorder Sister   . Bipolar disorder Sister   . Depression Maternal Grandmother   . ADD / ADHD Neg Hx   . Alcohol abuse Neg Hx   . Drug abuse Neg Hx   . Dementia Neg Hx   . OCD Neg Hx   . Paranoid behavior Neg Hx   . Schizophrenia Neg Hx   . Seizures Neg Hx   . Sexual abuse Neg Hx   . Physical abuse Neg Hx   . Suicidality Neg Hx   . Colon cancer Neg Hx     Social History:  Social History   Socioeconomic History  . Marital status: Married    Spouse name: Not on file  . Number of children: 0  . Years of education: Not on file  . Highest education level: Not on file  Occupational History  . Occupation: disability  Tobacco Use  . Smoking status: Former Smoker    Packs/day: 0.25    Years: 33.00    Pack years: 8.25    Types: Cigarettes    Quit date: 01/21/2015    Years since quitting: 4.5  . Smokeless tobacco: Never Used  . Tobacco comment: 9-10 cigs a day as of 10/20/2012, (02-07-15 per pt, she stopped smoking 01-21-15)  Substance and Sexual Activity  . Alcohol use: No    Alcohol/week: 0.0 standard drinks  . Drug use: Not Currently    Types: Marijuana    Comment: quit 10 days   . Sexual activity: Yes    Birth control/protection: Surgical  Other Topics Concern  . Not on file  Social History Narrative  . Not on file   Social Determinants of Health   Financial Resource Strain:   . Difficulty of Paying  Living Expenses:   Food Insecurity:   . Worried About Charity fundraiser in the Last Year:   . Arboriculturist in the Last Year:   Transportation Needs:   . Film/video editor (Medical):   Marland Kitchen Lack of Transportation (Non-Medical):   Physical Activity:   . Days of Exercise per Week:   . Minutes of Exercise per Session:   Stress:   . Feeling of Stress :   Social Connections:   .  Frequency of Communication with Friends and Family:   . Frequency of Social Gatherings with Friends and Family:   . Attends Religious Services:   . Active Member of Clubs or Organizations:   . Attends Archivist Meetings:   Marland Kitchen Marital Status:     Allergies:  Allergies  Allergen Reactions  . Oxycodone Anxiety  . Abilify [Aripiprazole]     Stiff neck  . Bactrim [Sulfamethoxazole-Trimethoprim]     Tongue swelled  . Gabapentin     Dizziness to the point she actually fell  . Ampicillin Rash    Has patient had a PCN reaction causing immediate rash, facial/tongue/throat swelling, SOB or lightheadedness with hypotension: Unknown Has patient had a PCN reaction causing severe rash involving mucus membranes or skin necrosis: Unknown Has patient had a PCN reaction that required hospitalization: No Has patient had a PCN reaction occurring within the last 10 years: No If all of the above answers are "NO", then may proceed with Cephalosporin use.      Metabolic Disorder Labs: Lab Results  Component Value Date   HGBA1C 5.2 03/10/2018   MPG 102.54 03/10/2018   MPG 102.54 12/05/2016   No results found for: PROLACTIN Lab Results  Component Value Date   CHOL 123 01/14/2019   TRIG 90 01/14/2019   HDL 36 (L) 01/14/2019   CHOLHDL 3.4 01/14/2019   VLDL 16 03/10/2018   LDLCALC 70 01/14/2019   LDLCALC 84 03/10/2018   Lab Results  Component Value Date   TSH 0.87 01/14/2019   TSH 14.938 (H) 03/10/2018    Therapeutic Level Labs: No results found for: LITHIUM No results found for: VALPROATE No  components found for:  CBMZ  Current Medications: Current Outpatient Medications  Medication Sig Dispense Refill  . Acetaminophen-Caffeine (EXCEDRIN ASPIRIN FREE PO) Take 2 tablets by mouth daily as needed.    . ALPRAZolam (XANAX) 1 MG tablet Take 1 tablet (1 mg total) by mouth 3 (three) times daily as needed for anxiety. 90 tablet 2  . Cyanocobalamin (VITAMIN B 12) 100 MCG LOZG Take by mouth.    Marland Kitchen FLUoxetine (PROZAC) 20 MG capsule Take 1 capsule (20 mg total) by mouth 3 (three) times daily. 90 capsule 2  . HYDROcodone-acetaminophen (NORCO) 5-325 MG tablet Take 1 tablet by mouth 2 (two) times daily as needed for moderate pain. 60 tablet 0  . lactulose (CHRONULAC) 10 GM/15ML solution Take 30 mLs (20 g total) by mouth 2 (two) times daily as needed for mild constipation. 1892 mL 3  . Multiple Vitamins-Minerals (CENTRUM PO) Take 1 tablet by mouth every evening.    . traZODone (DESYREL) 100 MG tablet Take 1 tablet (100 mg total) by mouth at bedtime as needed for sleep. 90 tablet 1   No current facility-administered medications for this visit.     Musculoskeletal: Strength & Muscle Tone: within normal limits Gait & Station: normal Patient leans: N/A  Psychiatric Specialty Exam: Review of Systems  Constitutional: Positive for fatigue.  Gastrointestinal: Positive for abdominal pain.  Psychiatric/Behavioral: Positive for dysphoric mood. The patient is nervous/anxious.   All other systems reviewed and are negative.   There were no vitals taken for this visit.There is no height or weight on file to calculate BMI.  General Appearance: NA  Eye Contact:  NA  Speech:  Clear and Coherent  Volume:  Decreased  Mood:  Anxious and Dysphoric  Affect:  NA  Thought Process:  Goal Directed  Orientation:  Full (Time, Place, and Person)  Thought Content: Rumination   Suicidal Thoughts:  No  Homicidal Thoughts:  No  Memory:  Immediate;   Good Recent;   Good Remote;   Good  Judgement:  Fair   Insight:  Fair  Psychomotor Activity:  Decreased  Concentration:  Concentration: Fair and Attention Span: Fair  Recall:  Good  Fund of Knowledge: Good  Language: Good  Akathisia:  No  Handed:  Right  AIMS (if indicated): not done  Assets:  Communication Skills Desire for Improvement Resilience Social Support Talents/Skills  ADL's:  Intact  Cognition: WNL  Sleep:  Good   Screenings: AIMS     Admission (Discharged) from OP Visit from 03/10/2018 in [place] [facility] Admission (Discharged) from 12/03/2016 in [place] [facility]  AIMS Total Score  0  0    AUDIT     Admission (Discharged) from OP Visit from 03/10/2018 in [place] [facility] Admission (Discharged) from 12/03/2016 in [place] [facility]  Alcohol Use Disorder Identification Test Final Score (AUDIT)  0  0    MDI     Office Visit from 11/15/2015 in [facility]  Total Score (max 50)  24    PHQ2-9     Office Visit from 01/14/2019 in [place] Office Visit from 07/07/2018 in [place] Office Visit from 10/27/2017 in [place] Office Visit from 01/19/2017 in [place] Office Visit from 12/10/2016 in [place]  PHQ-2 Total Score  2  0  0  4  4  PHQ-9 Total Score  8  --  --  17  15       Assessment and Plan: This patient is a 59 year old female with a history of PTSD depression and anxiety.  Although she is not doing well right now she tends to go through these periods of self-doubt and after we discussed this at length she seems to be feeling better.  For now we will continue Prozac 20 mg 3 times daily for depression, Xanax 1 mg 3 times daily as needed for anxiety and trazodone 100 mg at bedtime for sleep.  She will return to see me in 4 weeks   Levonne Spiller, MD 07/25/2019, 11:18 AM

## 2019-07-27 ENCOUNTER — Other Ambulatory Visit: Payer: Self-pay

## 2019-07-27 ENCOUNTER — Ambulatory Visit (INDEPENDENT_AMBULATORY_CARE_PROVIDER_SITE_OTHER): Payer: Medicare Other | Admitting: Clinical

## 2019-07-27 DIAGNOSIS — F332 Major depressive disorder, recurrent severe without psychotic features: Secondary | ICD-10-CM

## 2019-07-27 DIAGNOSIS — F4310 Post-traumatic stress disorder, unspecified: Secondary | ICD-10-CM

## 2019-07-27 NOTE — Progress Notes (Signed)
Virtual Visit via Telephone Note  I connected with Kathleen Palmer on 07/27/19 at  9:00 AM EDT by telephone and verified that I am speaking with the correct person using two identifiers.  Location: Patient: Home Provider: Office    I discussed the limitations, risks, security and privacy concerns of performing an evaluation and management service by telephone and the availability of in person appointments. I also discussed with the patient that there may be a patient responsible charge related to this service. The patient expressed understanding and agreed to proceed.        THERAPIST PROGRESS NOTE  Session Time: 9:00AM-9:45AM  Participation Level: Active  Behavioral Response: CasualAlertDepressed  Type of Therapy: Individual Therapy  Treatment Goals addressed: Coping  Interventions: CBT, Motivational Interviewing, Strength-based and Supportive  Summary: Kathleen Palmer is a 59 y.o. female who presents with  Depression and Anxiety.The OPT therapist worked with the patient for her ongoing OPT treatment. The OPT therapist utilized Motivational Interviewing to assist in creating therapeutic repore. The patient in the session was engaged and work in collaboration giving feedback about her triggers and symptoms over the past few weeks including ongoing difficulty with her health and the impact of worrying for a family member who is currently addicted to substance use. The OPT therapist utilized Cognitive Behavioral Therapy through cognitive restructuring as well as worked with the patient on coping strategies to assist in management of mood and anxiety. The OPT therapist worked to empower the patient to change her focus to her health as a priority. The OPT therapist inquired for holistic care about the patients adherence to medication therapy.  Suicidal/Homicidal: Nowithout intent/plan   Therapist Response: The OPT therapist worked with the patient for the patients scheduled session. The  patient was engaged in her session and gave feedback in relation to triggers, symptoms, and behavior responses over the past few weeks. The OPT therapist worked with the patient utilizing an in session Cognitive Behavioral Therapy exercise. The patient was responsive in the session and verbalized, " I am going to focus on myself and make sure I make it to my doctor appointments, and I have realized I have to let go of the feelings of responsibility for my family and pray they get the help they need".. The OPT therapist will continue treatment work with the patient in her next scheduled session.  Plan: Return again in 3 weeks.  Diagnosis: Axis I: Major depressive disorder, recurrent episode, severe with anxious distress and PTSD (post-traumatic stress disorder)     Axis II: No diagnosis  I discussed the assessment and treatment plan with the patient. The patient was provided an opportunity to ask questions and all were answered. The patient agreed with the plan and demonstrated an understanding of the instructions.   The patient was advised to call back or seek an in-person evaluation if the symptoms worsen or if the condition fails to improve as anticipated.  I provided 40 minutes of non-face-to-face time during this encounter.   Lennox Grumbles, LCSW 07/27/2019

## 2019-08-01 ENCOUNTER — Other Ambulatory Visit: Payer: Self-pay | Admitting: *Deleted

## 2019-08-01 MED ORDER — HYDROCODONE-ACETAMINOPHEN 5-325 MG PO TABS: 1.0000 | ORAL_TABLET | Freq: Two times a day (BID) | ORAL | 0 refills | Status: DC | PRN

## 2019-08-01 NOTE — Telephone Encounter (Signed)
Received call from patient.   Requested refill on Hydrocodone/APAP.   Ok to refill??  Last office visit 01/14/2019.  Last refill 07/04/2019.

## 2019-08-09 ENCOUNTER — Ambulatory Visit: Payer: Medicare Other | Admitting: Family Medicine

## 2019-08-16 ENCOUNTER — Telehealth (HOSPITAL_COMMUNITY): Payer: Self-pay | Admitting: *Deleted

## 2019-08-16 ENCOUNTER — Other Ambulatory Visit (HOSPITAL_COMMUNITY): Payer: Self-pay | Admitting: Psychiatry

## 2019-08-16 NOTE — Telephone Encounter (Signed)
Could you verify with the pharmacy. [name] sent orders for 90 days with one refill in March.

## 2019-08-16 NOTE — Telephone Encounter (Signed)
Even so, she should have one refill left. Please verify with the pharmacy.

## 2019-08-16 NOTE — Telephone Encounter (Signed)
PER PROVIDER :: she should have one refill left. Please verify with the pharmacy  SPOKE [facility] HASN'T CHANGED THE INSURANCE WILL NOT COVER. ONCE PATIENT WAS NOTIFIED THAT INSURANCE WILL NOT COVER & THAT SHE CAN PAY OUT OF POCKET.  SHE DECIDED SHE WILL WAIT

## 2019-08-16 NOTE — Telephone Encounter (Signed)
PER PROVIDER:: Could you verify with the pharmacy. [name] sent orders for 90 days with one refill in March.   PATIENT STATED SHE'S OUT OF MED'S BECAUSE SHE HAD CONTINUED THEM AS PREVIOUSLY  PRESCRIBED TAKEN 2 TABS INSTEAD OF 1 TAB

## 2019-08-16 NOTE — Telephone Encounter (Signed)
Dr Modesta Messing dr Harrington Challenger patient  Patient called requested refill after Rx informed 0 refills &  0 on file/hold traZODone (DESYREL) 100 MG tablet --- next appt is 08/26/2019

## 2019-08-26 ENCOUNTER — Telehealth (INDEPENDENT_AMBULATORY_CARE_PROVIDER_SITE_OTHER): Payer: Medicare Other | Admitting: Psychiatry

## 2019-08-26 ENCOUNTER — Other Ambulatory Visit: Payer: Self-pay

## 2019-08-26 ENCOUNTER — Encounter (HOSPITAL_COMMUNITY): Payer: Self-pay

## 2019-08-26 ENCOUNTER — Encounter (HOSPITAL_COMMUNITY): Payer: Self-pay | Admitting: Psychiatry

## 2019-08-26 ENCOUNTER — Emergency Department (HOSPITAL_COMMUNITY): Payer: Medicare Other

## 2019-08-26 ENCOUNTER — Emergency Department (HOSPITAL_COMMUNITY)
Admission: EM | Admit: 2019-08-26 | Discharge: 2019-08-26 | Disposition: A | Discharge status: Home / Self Care | Payer: Medicare Other | Attending: Emergency Medicine | Admitting: Emergency Medicine

## 2019-08-26 DIAGNOSIS — F4310 Post-traumatic stress disorder, unspecified: Secondary | ICD-10-CM | POA: Diagnosis not present

## 2019-08-26 DIAGNOSIS — R404 Transient alteration of awareness: Secondary | ICD-10-CM | POA: Diagnosis not present

## 2019-08-26 DIAGNOSIS — R569 Unspecified convulsions: Secondary | ICD-10-CM | POA: Insufficient documentation

## 2019-08-26 DIAGNOSIS — F332 Major depressive disorder, recurrent severe without psychotic features: Secondary | ICD-10-CM

## 2019-08-26 DIAGNOSIS — R1084 Generalized abdominal pain: Secondary | ICD-10-CM | POA: Insufficient documentation

## 2019-08-26 DIAGNOSIS — R4020 Unspecified coma: Secondary | ICD-10-CM | POA: Diagnosis not present

## 2019-08-26 DIAGNOSIS — Z79899 Other long term (current) drug therapy: Secondary | ICD-10-CM | POA: Insufficient documentation

## 2019-08-26 DIAGNOSIS — R000 Tachycardia, unspecified: Secondary | ICD-10-CM | POA: Diagnosis not present

## 2019-08-26 DIAGNOSIS — R270 Ataxia, unspecified: Secondary | ICD-10-CM | POA: Diagnosis not present

## 2019-08-26 DIAGNOSIS — G40909 Epilepsy, unspecified, not intractable, without status epilepticus: Secondary | ICD-10-CM | POA: Diagnosis not present

## 2019-08-26 DIAGNOSIS — R0689 Other abnormalities of breathing: Secondary | ICD-10-CM | POA: Diagnosis not present

## 2019-08-26 DIAGNOSIS — Z87891 Personal history of nicotine dependence: Secondary | ICD-10-CM | POA: Diagnosis not present

## 2019-08-26 DIAGNOSIS — K5730 Diverticulosis of large intestine without perforation or abscess without bleeding: Secondary | ICD-10-CM | POA: Diagnosis not present

## 2019-08-26 LAB — COMPREHENSIVE METABOLIC PANEL
ALT: 39 U/L (ref 0–44)
AST: 46 U/L — ABNORMAL HIGH (ref 15–41)
Albumin: 3.9 g/dL (ref 3.5–5.0)
Alkaline Phosphatase: 67 U/L (ref 38–126)
Anion gap: 10 (ref 5–15)
BUN: 12 mg/dL (ref 6–20)
CO2: 22 mmol/L (ref 22–32)
Calcium: 8.7 mg/dL — ABNORMAL LOW (ref 8.9–10.3)
Chloride: 105 mmol/L (ref 98–111)
Creatinine, Ser: 0.74 mg/dL (ref 0.44–1.00)
GFR calc Af Amer: >60 mL/min (ref >60)
GFR calc non Af Amer: >60 mL/min (ref >60)
Glucose, Bld: 158 mg/dL — ABNORMAL HIGH (ref 70–99)
Potassium: 3.1 mmol/L — ABNORMAL LOW (ref 3.5–5.1)
Sodium: 137 mmol/L (ref 135–145)
Total Bilirubin: 1 mg/dL (ref 0.3–1.2)
Total Protein: 7 g/dL (ref 6.5–8.1)

## 2019-08-26 LAB — CBC WITH DIFFERENTIAL/PLATELET
Abs Immature Granulocytes: 0.01 10*3/uL (ref 0.00–0.07)
Basophils Absolute: 0 10*3/uL (ref 0.0–0.1)
Basophils Relative: 0 %
Eosinophils Absolute: 0 10*3/uL (ref 0.0–0.5)
Eosinophils Relative: 1 %
HCT: 44.9 % (ref 36.0–46.0)
Hemoglobin: 15.2 g/dL — ABNORMAL HIGH (ref 12.0–15.0)
Immature Granulocytes: 0 %
Lymphocytes Relative: 12 %
Lymphs Abs: 0.5 10*3/uL — ABNORMAL LOW (ref 0.7–4.0)
MCH: 31.3 pg (ref 26.0–34.0)
MCHC: 33.9 g/dL (ref 30.0–36.0)
MCV: 92.6 fL (ref 80.0–100.0)
Monocytes Absolute: 0.3 10*3/uL (ref 0.1–1.0)
Monocytes Relative: 7 %
Neutro Abs: 3 10*3/uL (ref 1.7–7.7)
Neutrophils Relative %: 80 %
Platelets: 45 10*3/uL — ABNORMAL LOW (ref 150–400)
RBC: 4.85 MIL/uL (ref 3.87–5.11)
RDW: 13.5 % (ref 11.5–15.5)
WBC: 3.8 10*3/uL — ABNORMAL LOW (ref 4.0–10.5)
nRBC: 0 % (ref 0.0–0.2)

## 2019-08-26 LAB — TROPONIN I (HIGH SENSITIVITY): Troponin I (High Sensitivity): 12 ng/L (ref <18)

## 2019-08-26 MED ORDER — FLUOXETINE HCL 20 MG PO CAPS: 20.0000 mg | ORAL_CAPSULE | Freq: Three times a day (TID) | ORAL | 2 refills | Status: DC

## 2019-08-26 MED ORDER — ALPRAZOLAM 0.5 MG PO TABS
1.0000 mg | ORAL_TABLET | Freq: Once | ORAL | Status: AC
  Administered 2019-08-26: 1 mg via ORAL
  Filled 2019-08-26: qty 2

## 2019-08-26 MED ORDER — ALPRAZOLAM 1 MG PO TABS: 1.0000 mg | ORAL_TABLET | Freq: Three times a day (TID) | ORAL | 2 refills | Status: DC | PRN

## 2019-08-26 MED ORDER — TRAZODONE HCL 100 MG PO TABS: 100.0000 mg | ORAL_TABLET | Freq: Every evening | ORAL | 1 refills | Status: DC | PRN

## 2019-08-26 MED ORDER — SODIUM CHLORIDE 0.9 % IV BOLUS
1000.0000 mL | Freq: Once | INTRAVENOUS | Status: AC
  Administered 2019-08-26: 17:00:00 1000 mL via INTRAVENOUS

## 2019-08-26 MED ORDER — ONDANSETRON HCL 4 MG/2ML IJ SOLN
4.0000 mg | Freq: Once | INTRAMUSCULAR | Status: AC
  Administered 2019-08-26: 4 mg via INTRAVENOUS
  Filled 2019-08-26: qty 2

## 2019-08-26 MED ORDER — FLUOXETINE HCL 20 MG PO CAPS
20.0000 mg | ORAL_CAPSULE | Freq: Once | ORAL | Status: AC
  Administered 2019-08-26: 20 mg via ORAL
  Filled 2019-08-26: qty 1

## 2019-08-26 MED ORDER — IOHEXOL 300 MG/ML  SOLN
100.0000 mL | Freq: Once | INTRAMUSCULAR | Status: AC | PRN
  Administered 2019-08-26: 100 mL via INTRAVENOUS

## 2019-08-26 NOTE — ED Notes (Signed)
Spoke with husband on pt status and checked on phone number for return calls.

## 2019-08-26 NOTE — ED Triage Notes (Signed)
Pt was at home with husband and was sitting on the bed when husband walked out of room. Upon husband return pt was having a seizure. EMS reports pt still seizing upon arrival that lasted about 5 minutes. Pt alert and verbal now, unaware of what happened. Redness and abrasion noted on face and right knee from thrashing on carpet at home.

## 2019-08-26 NOTE — ED Provider Notes (Signed)
[facility] EMERGENCY DEPARTMENT Provider Note   CSN: [id number] Arrival date & time: 08/26/19  1546     History Chief Complaint  Patient presents with  . Seizures    [facility] is a 59 y.o. female.  The patient had a seizure today.  The seizure was witnessed by her husband.  It lasted for a few minutes.  Patient was confused for a little while afterwards.  She has never had a seizure before but she is on Prozac and Xanax.  The history is provided by the patient. No language interpreter was used.  Seizures Seizure activity on arrival: yes   Seizure type:  Grand mal Preceding symptoms: no sensation of an aura present   Initial focality:  None Episode characteristics: abnormal movements   Postictal symptoms: confusion   Return to baseline: yes   Severity:  Moderate Timing:  Once Progression:  Resolved Context: not alcohol withdrawal        Past Medical History:  Diagnosis Date  . Angiomyolipoma    Left kidney  . Anxiety   . Arthritis   . Attention to urostomy [facility])   . Chronic pain   . Depression   . Hepatitis C    ? Contracted through IVDA  . Panic 04/29/1999  . PTSD (post-traumatic stress disorder)   . Substance abuse ([place])    Remote history- cocaine, ETOH, Marijuana  . Thyroid disease     Patient Active Problem List   Diagnosis Date Noted  . Aortic atherosclerosis ([place]) 07/07/2018  . Thrombocytopenia ([place]) 03/11/2018  . Ventral hernia 10/27/2017  . Gastritis due to nonsteroidal anti-inflammatory drug   . Constipation 12/30/2016  . Substance induced mood disorder ([place]) 12/04/2016  . 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 Family hx of ALS (amyotrophic lateral sclerosis) 08/31/2015  . Chronic hepatitis C ([place]) 01/24/2014  . Osteopenia 12/02/2013  . OA (osteoarthritis) of knee 09/20/2013  . Clitoral irritation 08/01/2013  . Epidermoid cyst of skin 08/01/2013  . Atypical chest pain 03/25/2013  .  Encounter for screening colonoscopy 03/25/2013  . Knee pain 03/25/2013  . Marijuana use 07/20/2012  . Unspecified vitamin D deficiency 06/02/2012  . Pain 04/07/2012  . Elevated blood pressure (not hypertension) 04/06/2012  . 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 Subclinical hypothyroidism 02/06/2012  . Chronic pain syndrome 02/06/2012  . H/O vaginal surgery 02/06/2012  . 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 Ileostomy status ([place]) 02/06/2012  . 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 PTSD (post-traumatic stress disorder) 02/06/2012  . MDD (major depressive disorder) ([place]) 02/06/2012    Past Surgical History:  Procedure Laterality Date  . ABDOMINAL HYSTERECTOMY    . ABDOMINAL SURGERY    . BIOPSY  01/27/2017   Procedure: BIOPSY;  Surgeon: Danie Binder, MD;  Location: [facility] ENDO SUITE;  Service: Endoscopy;;  gastric  . CHOLECYSTECTOMY    . COLONOSCOPY WITH PROPOFOL N/A 01/27/2017   Procedure: COLONOSCOPY WITH PROPOFOL;  Surgeon: Danie Binder, MD;  Location: [facility] ENDO SUITE;  Service: Endoscopy;  Laterality: N/A;  12:00pm  . ESOPHAGOGASTRODUODENOSCOPY (EGD) WITH PROPOFOL N/A 01/27/2017   Procedure: ESOPHAGOGASTRODUODENOSCOPY (EGD) WITH PROPOFOL;  Surgeon: Danie Binder, MD;  Location: [facility] ENDO SUITE;  Service: Endoscopy;  Laterality: N/A;  . HERNIA REPAIR     x2- abdomen  . ILEO LOOP CONDUIT    . 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 VAGINA RECONSTRUCTION SURGERY       OB History   No obstetric history on file.     Family History  Adopted: Yes  Problem Relation Age of Onset  . Depression Mother   . Depression Sister   . Anxiety disorder Sister   . Bipolar disorder Sister   . Depression Maternal Grandmother   . ADD / ADHD Neg Hx   . Alcohol abuse Neg Hx   . Drug abuse Neg Hx   . Dementia Neg Hx   . OCD Neg Hx   . Paranoid behavior Neg Hx   .  Schizophrenia Neg Hx   . Seizures Neg Hx   . Sexual abuse Neg Hx   . Physical abuse Neg Hx   . Suicidality Neg Hx   . Colon cancer Neg Hx     Social History   Tobacco Use  . Smoking status: Former Smoker    Packs/day: 0.25    Years: 33.00    Pack years: 8.25    Types: Cigarettes    Quit date: 01/21/2015    Years since quitting: 4.5  . Smokeless tobacco: Never Used  . Tobacco comment: 9-10 cigs a day as of 10/20/2012, (02-07-15 per pt, she stopped smoking 01-21-15)  Substance Use Topics  . Alcohol use: No    Alcohol/week: 0.0 standard drinks  . Drug use: Not Currently    Types: Marijuana    Comment: quit 10 days     Home Medications Prior to Admission medications   Medication Sig Start Date End Date Taking? Authorizing Provider  Acetaminophen-Caffeine (EXCEDRIN ASPIRIN FREE PO) Take 2 tablets by mouth daily as needed.    [provider]  ALPRAZolam Duanne Moron) 1 MG tablet Take 1 tablet (1 mg total) by mouth 3 (three) times daily as needed for anxiety. 08/26/19 08/25/20  Cloria Spring, MD  Cyanocobalamin (VITAMIN B 12) 100 MCG LOZG Take by mouth.    [provider]  FLUoxetine (PROZAC) 20 MG capsule Take 1 capsule (20 mg total) by mouth 3 (three) times daily. 08/26/19   Cloria Spring, MD  HYDROcodone-acetaminophen (NORCO) 5-325 MG tablet Take 1 tablet by mouth 2 (two) times daily as needed for moderate pain. 08/01/19   [place], Modena Nunnery, MD  lactulose (CHRONULAC) 10 GM/15ML solution Take 30 mLs (20 g total) by mouth 2 (two) times daily as needed for mild constipation. 07/07/18   Alycia Rossetti, MD  Multiple Vitamins-Minerals (CENTRUM PO) Take 1 tablet by mouth every evening.    [provider]  traZODone (DESYREL) 100 MG tablet Take 1 tablet (100 mg total) by mouth at bedtime as needed for sleep. 08/26/19   Cloria Spring, MD    Allergies    Oxycodone, Abilify [aripiprazole], Bactrim [sulfamethoxazole-trimethoprim], Gabapentin, and Ampicillin  Review of  Systems   Review of Systems  Constitutional: Negative for appetite change and fatigue.  HENT: Negative for congestion, ear discharge and sinus pressure.   Eyes: Negative for discharge.  Respiratory: Negative for cough.   Cardiovascular: Negative for chest pain.  Gastrointestinal: Negative for abdominal pain and diarrhea.  Genitourinary: Negative for frequency and hematuria.  Musculoskeletal: Negative for back pain.  Skin: Negative for rash.  Neurological: Positive for seizures. Negative for headaches.  Psychiatric/Behavioral: Negative for hallucinations.    Physical Exam Updated Vital Signs BP 134/79 (BP Location: Right Arm)   Pulse 96   Temp 99.3 F (37.4 C) (Oral)   Resp Marland Kitchen)  25   Ht 5\' 3"  (1.6 m)   Wt 72.6 kg   SpO2 95%   BMI 28.34 kg/m   Physical Exam Vitals and nursing note reviewed.  Constitutional:      Appearance: She is well-developed.  HENT:     Head: Normocephalic.     Nose: Nose normal.  Eyes:     General: No scleral icterus.    Conjunctiva/sclera: Conjunctivae normal.  Neck:     Thyroid: No thyromegaly.  Cardiovascular:     Rate and Rhythm: Normal rate and regular rhythm.     Heart sounds: No murmur. No friction rub. No gallop.   Pulmonary:     Breath sounds: No stridor. No wheezing or rales.  Chest:     Chest wall: No tenderness.  Abdominal:     General: There is no distension.     Tenderness: There is no abdominal tenderness. There is no rebound.  Musculoskeletal:        General: Normal range of motion.     Cervical back: Neck supple.  Lymphadenopathy:     Cervical: No cervical adenopathy.  Skin:    Findings: No erythema or rash.  Neurological:     Mental Status: She is alert and oriented to person, place, and time.     Motor: No abnormal muscle tone.     Coordination: Coordination normal.  Psychiatric:        Behavior: Behavior normal.     ED Results / Procedures / Treatments   Labs (all labs ordered are listed, but only abnormal  results are displayed) Labs Reviewed  CBC WITH DIFFERENTIAL/PLATELET - Abnormal; Notable for the following components:      Result Value   WBC 3.8 (*)    Hemoglobin 15.2 (*)    Platelets 45 (*)    Lymphs Abs 0.5 (*)    All other components within normal limits  COMPREHENSIVE METABOLIC PANEL - Abnormal; Notable for the following components:   Potassium 3.1 (*)    Glucose, Bld 158 (*)    Calcium 8.7 (*)    AST 46 (*)    All other components within normal limits  URINALYSIS, ROUTINE W REFLEX MICROSCOPIC  TROPONIN I (HIGH SENSITIVITY)  TROPONIN I (HIGH SENSITIVITY)    EKG EKG Interpretation  Date/Time:  Friday August 26 2019 15:55:54 EDT Ventricular Rate:  99 PR Interval:    QRS Duration: 92 QT Interval:  383 QTC Calculation: 492 R Axis:   87 Text Interpretation: Sinus rhythm Borderline right axis deviation Borderline prolonged QT interval Confirmed by Milton Ferguson [phone]) on 08/26/2019 7:01:32 PM   Radiology CT Head Wo Contrast  Result Date: 08/26/2019 CLINICAL DATA:  Ataxia, stroke suspected. Additional history provided: Seizure today with fall, nausea EXAM: CT HEAD WITHOUT CONTRAST TECHNIQUE: Contiguous axial images were obtained from the base of the skull through the vertex without intravenous contrast. COMPARISON:  Brain MRI 03/09/2015 FINDINGS: Brain: Nonspecific cerebral white matter disease was better appreciated on prior MRI 03/09/2015. There is no acute intracranial hemorrhage. No demarcated cortical infarct. No extra-axial fluid collection. No evidence of intracranial mass. No midline shift. Vascular: No hyperdense vessel. Skull: Normal. Negative for fracture or focal lesion. Sinuses/Orbits: Visualized orbits show no acute finding. No significant paranasal sinus disease or mastoid effusion at the imaged levels. IMPRESSION: 1. No evidence of acute intracranial abnormality. 2. Nonspecific cerebral white matter disease was better appreciated on prior MRI 03/09/2015.  Electronically Signed   By: Kellie Simmering DO   On: 08/26/2019  18:17   CT ABDOMEN PELVIS W CONTRAST  Result Date: 08/26/2019 CLINICAL DATA:  Acute generalized abdominal pain. Seizure today. Nausea. Patient reports chronic abdominal pain from known hernia. EXAM: CT ABDOMEN AND PELVIS WITH CONTRAST TECHNIQUE: Multidetector CT imaging of the abdomen and pelvis was performed using the standard protocol following bolus administration of intravenous contrast. CONTRAST:  160mL OMNIPAQUE IOHEXOL 300 MG/ML  SOLN COMPARISON:  Most recent CT 07/02/2018 FINDINGS: Lower chest: Wall thickening of the distal esophagus. No focal airspace disease or pleural effusion. Hepatobiliary: Stable biliary prominence postcholecystectomy. No focal hepatic lesion. Slightly nodular hepatic contours. Pancreas: No ductal dilatation or inflammation. Spleen: Stable splenomegaly with spleen spanning 18 cm cranial caudal. Previous hypodensity in the anterior spleen is not well visualized currently. Adrenals/Urinary Tract: Normal adrenal glands. Stable bilateral pelviectasis without frank hydronephrosis. Scattered areas of cortical scarring involving both kidneys. No perinephric edema. Right lower quadrant urostomy is unchanged in appearance. Patient with history of bladder exstrophy. There is unchanged soft tissue density in the expected location of the bladder in the pelvis with channel extending to the skin surface, stable. Stomach/Bowel: Small hiatal hernia. There is wall thickening of the distal esophagus. Stomach is otherwise unremarkable. There is no bowel obstruction or inflammation. Stable postsurgical changes the anterior abdominal wall with small bowel loops opposed to the left lower quadrant, stable. Appendix is not visualized. Scattered distal colonic diverticulosis without diverticulitis. Vascular/Lymphatic: Aorto bi-iliac atherosclerosis. Patent portal vein. Stable prominent porta hepatis nodes, presumably reactive. No new or enlarging  lymph nodes in the abdomen or pelvis. Reproductive: Status post hysterectomy. No adnexal masses. Other: No ascites or free air. There is postsurgical change of the anterior abdominal wall, stable in appearance from prior exam. Small fat containing supraumbilical ventral abdominal wall hernia. Musculoskeletal: Chronic pelvic deformity, stable in appearance from prior exam. There are no acute or suspicious osseous abnormalities. IMPRESSION: 1. Wall thickening of the distal esophagus, can be seen with reflux or esophagitis. Small hiatal hernia. 2. Stable splenomegaly. Slightly nodular hepatic contours, can be seen with cirrhosis. 3. Colonic diverticulosis without diverticulitis. 4. Additional chronic findings are unchanged. Aortic Atherosclerosis (ICD10-I70.0). Electronically Signed   By: Keith Rake M.D.   On: 08/26/2019 18:20   DG Chest Port 1 View  Result Date: 08/26/2019 CLINICAL DATA:  59 year old presenting with seizures and generalized weakness. Former smoker. EXAM: PORTABLE CHEST 1 VIEW COMPARISON:  07/01/2018. FINDINGS: Cardiac silhouette normal in size for AP portable technique. Hilar and mediastinal contours unremarkable. A curvilinear margin to the RIGHT of midline overlying the LOWER chest I believe represents the patient's LEFT breast rather than true pathology. Lungs clear. Bronchovascular markings normal. Pulmonary vascularity normal. No visible pleural effusions. No pneumothorax. IMPRESSION: No acute cardiopulmonary disease.  Please see above comments. Electronically Signed   By: Evangeline Dakin M.D.   On: 08/26/2019 16:43    Procedures Procedures (including critical care time)  Medications Ordered in ED Medications  FLUoxetine (PROZAC) capsule 20 mg (has no administration in time range)  ALPRAZolam (XANAX) tablet 1 mg (has no administration in time range)  sodium chloride 0.9 % bolus 1,000 mL (0 mLs Intravenous Stopped 08/26/19 1846)  ondansetron (ZOFRAN) injection 4 mg (4 mg  Intravenous Given 08/26/19 1636)  iohexol (OMNIPAQUE) 300 MG/ML solution 100 mL (100 mLs Intravenous Contrast Given 08/26/19 1745)    ED Course  I have reviewed the triage vital signs and the nursing notes.  Pertinent labs & imaging results that were available during my care of the patient were reviewed  by me and considered in my medical decision making (see chart for details).  CRITICAL CARE Performed by: Milton Ferguson Total critical care time: 40 minutes Critical care time was exclusive of separately billable procedures and treating other patients. Critical care was necessary to treat or prevent imminent or life-threatening deterioration. Critical care was time spent personally by me on the following activities: development of treatment plan with patient and/or surrogate as well as nursing, discussions with consultants, evaluation of patient's response to treatment, examination of patient, obtaining history from patient or surrogate, ordering and performing treatments and interventions, ordering and review of laboratory studies, ordering and review of radiographic studies, pulse oximetry and re-evaluation of patient's condition. Marland Kitchen r MDM Rules/Calculators/A&P                      Patient with new onset seizure.  Labs unremarkable CT scan shows no acute problems.  She will be discharged home to follow-up with neurology and given seizure precautions    This patient presents to the ED for concern of seizure, this involves an extensive number of treatment options, and is a complaint that carries with it a high risk of complications and morbidity.  The differential diagnosis includes metabolic issues, brain tumor, seizures from Xanax   Lab Tests:   I Ordered, reviewed, and interpreted labs, which included CBC chemistries which showed mild hypokalemia  Medicines ordered:  I ordered medication Xanax and Prozac because she said she was do that medicine Imaging Studies ordered:   I  ordered imaging studies which included CT head and  I independently visualized and interpreted imaging which showed no acute disease  Additional history obtained:   Additional history obtained from records  Previous records obtained and reviewed   Consultations Obtained:   Reevaluation:  After the interventions stated above, I reevaluated the patient and found back to normal  Critical Interventions:  .   Final Clinical Impression(s) / ED Diagnoses Final diagnoses:  Seizure [facility])    Rx / [place] Orders ED Discharge Orders    None       Milton Ferguson, MD 08/26/19 [place]

## 2019-08-26 NOTE — Discharge Instructions (Addendum)
Do not drive.  Do not put yourself in a position where if you had a seizure you would get hurt.  For example climbing on a ladder.  Return to the emergency department if you have another seizure or if you feel like you need to be seen again.  Call Dr. Merlene Laughter office Monday to be seen next week.

## 2019-08-26 NOTE — Progress Notes (Signed)
Virtual Visit via Telephone Note  I connected with Kathleen Palmer on 08/26/19 at 10:20 AM EDT by telephone and verified that I am speaking with the correct person using two identifiers.   I discussed the limitations, risks, security and privacy concerns of performing an evaluation and management service by telephone and the availability of in person appointments. I also discussed with the patient that there may be a patient responsible charge related to this service. The patient expressed understanding and agreed to proceed    I discussed the assessment and treatment plan with the patient. The patient was provided an opportunity to ask questions and all were answered. The patient agreed with the plan and demonstrated an understanding of the instructions.   The patient was advised to call back or seek an in-person evaluation if the symptoms worsen or if the condition fails to improve as anticipated.  I provided 15 minutes of non-face-to-face time during this encounter.   Kathleen Spiller, MD  [facility] MD/PA/NP OP Progress Note  08/26/2019 10:32 AM Kathleen Palmer  MRN:  [id number]  Chief Complaint:  Chief Complaint    Depression; Anxiety; Follow-up     HPI: This patient is a 59 year old married white female who lives with her husband in [place].  She is on disability.  She has a history of depression anxiety and posttraumatic stress disorder as well as borderline personality traits.  The patient returns for follow-up after 4 weeks.  Last time she was very worried about her niece who had been abusing drugs and become volatile and difficult.  She states her niece is now in the psychiatric hospital and has been diagnosed as schizophrenic and has been placed on medications which have been helpful.  She does have a sense of relief about this.  Right now her mood is stable and she denies being significantly depressed or having thoughts of suicide or self-harm.  She is sleeping well with the trazodone the  Xanax continues to help with her anxiety. Visit Diagnosis:    ICD-10-CM   1. Major depressive disorder, recurrent episode, severe with anxious distress ([place])  F33.2   2. PTSD (post-traumatic stress disorder)  F43.10     Past Psychiatric History: Several previous hospitalizations for depression and suicidal ideation, the last 1 being 2 years ago  Past Medical History:  Past Medical History:  Diagnosis Date  . Angiomyolipoma    Left kidney  . Anxiety   . Arthritis   . Attention to urostomy [facility])   . Chronic pain   . Depression   . Hepatitis C    ? Contracted through IVDA  . Panic 04/29/1999  . PTSD (post-traumatic stress disorder)   . Substance abuse ([place])    Remote history- cocaine, ETOH, Marijuana  . Thyroid disease     Past Surgical History:  Procedure Laterality Date  . ABDOMINAL HYSTERECTOMY    . ABDOMINAL SURGERY    . BIOPSY  01/27/2017   Procedure: BIOPSY;  Surgeon: Danie Binder, MD;  [facility];  Service: Endoscopy;;  gastric  . CHOLECYSTECTOMY    . COLONOSCOPY WITH PROPOFOL N/A 01/27/2017   Procedure: COLONOSCOPY WITH PROPOFOL;  Surgeon: Danie Binder, MD;  Location: [facility];  Service: Endoscopy;  Laterality: N/A;  12:00pm  . ESOPHAGOGASTRODUODENOSCOPY (EGD) WITH PROPOFOL N/A 01/27/2017   Procedure: ESOPHAGOGASTRODUODENOSCOPY (EGD) WITH PROPOFOL;  Surgeon: Danie Binder, MD;  Location[facility];  Service: Endoscopy;  Laterality: N/A;  . HERNIA REPAIR  x2- abdomen  . ILEO LOOP CONDUIT    . 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 VAGINA RECONSTRUCTION SURGERY      Family Psychiatric History: see below  Family History:  Family History  Adopted: Yes  Problem Relation Age of Onset  . Depression Mother   . Depression Sister   . Anxiety disorder Sister   . Bipolar disorder Sister   . Depression Maternal Grandmother    . ADD / ADHD Neg Hx   . Alcohol abuse Neg Hx   . Drug abuse Neg Hx   . Dementia Neg Hx   . OCD Neg Hx   . Paranoid behavior Neg Hx   . Schizophrenia Neg Hx   . Seizures Neg Hx   . Sexual abuse Neg Hx   . Physical abuse Neg Hx   . Suicidality Neg Hx   . Colon cancer Neg Hx     Social History:  Social History   Socioeconomic History  . Marital status: Married    Spouse name: Not on file  . Number of children: 0  . Years of education: Not on file  . Highest education level: Not on file  Occupational History  . Occupation: disability  Tobacco Use  . Smoking status: Former Smoker    Packs/day: 0.25    Years: 33.00    Pack years: 8.25    Types: Cigarettes    Quit date: 01/21/2015    Years since quitting: 4.5  . Smokeless tobacco: Never Used  . Tobacco comment: 9-10 cigs a day as of 10/20/2012, (02-07-15 per pt, she stopped smoking 01-21-15)  Substance and Sexual Activity  . Alcohol use: No    Alcohol/week: 0.0 standard drinks  . Drug use: Not Currently    Types: Marijuana    Comment: quit 10 days   . Sexual activity: Yes    Birth control/protection: Surgical  Other Topics Concern  . Not on file  Social History Narrative  . Not on file   Social Determinants of Health   Financial Resource Strain:   . Difficulty of Paying Living Expenses:   Food Insecurity:   . Worried About Charity fundraiser in the Last Year:   . Arboriculturist in the Last Year:   Transportation Needs:   . Film/video editor (Medical):   Marland Kitchen Lack of Transportation (Non-Medical):   Physical Activity:   . Days of Exercise per Week:   . Minutes of Exercise per Session:   Stress:   . Feeling of Stress :   Social Connections:   . Frequency of Communication with Friends and Family:   . Frequency of Social Gatherings with Friends and Family:   . Attends Religious Services:   . Active Member of Clubs or Organizations:   . Attends Archivist Meetings:   Marland Kitchen Marital Status:      Allergies:  Allergies  Allergen Reactions  . Oxycodone Anxiety  . Abilify [Aripiprazole]     Stiff neck  . Bactrim [Sulfamethoxazole-Trimethoprim]     Tongue swelled  . Gabapentin     Dizziness to the point she actually fell  . Ampicillin Rash    Has patient had a PCN reaction causing immediate rash, facial/tongue/throat swelling, SOB or lightheadedness with hypotension: Unknown Has patient had a PCN reaction causing severe rash involving mucus membranes or skin necrosis: Unknown Has  patient had a PCN reaction that required hospitalization: No Has patient had a PCN reaction occurring within the last 10 years: No If all of the above answers are "NO", then may proceed with Cephalosporin use.      Metabolic Disorder Labs: Lab Results  Component Value Date   HGBA1C 5.2 03/10/2018   MPG 102.54 03/10/2018   MPG 102.54 12/05/2016   No results found for: PROLACTIN Lab Results  Component Value Date   CHOL 123 01/14/2019   TRIG 90 01/14/2019   HDL 36 (L) 01/14/2019   CHOLHDL 3.4 01/14/2019   VLDL 16 03/10/2018   LDLCALC 70 01/14/2019   LDLCALC 84 03/10/2018   Lab Results  Component Value Date   TSH 0.87 01/14/2019   TSH 14.938 (H) 03/10/2018    Therapeutic Level Labs: No results found for: LITHIUM No results found for: VALPROATE No components found for:  CBMZ  Current Medications: Current Outpatient Medications  Medication Sig Dispense Refill  . Acetaminophen-Caffeine (EXCEDRIN ASPIRIN FREE PO) Take 2 tablets by mouth daily as needed.    . ALPRAZolam (XANAX) 1 MG tablet Take 1 tablet (1 mg total) by mouth 3 (three) times daily as needed for anxiety. 90 tablet 2  . Cyanocobalamin (VITAMIN B 12) 100 MCG LOZG Take by mouth.    Marland Kitchen FLUoxetine (PROZAC) 20 MG capsule Take 1 capsule (20 mg total) by mouth 3 (three) times daily. 90 capsule 2  . HYDROcodone-acetaminophen (NORCO) 5-325 MG tablet Take 1 tablet by mouth 2 (two) times daily as needed for moderate pain. 60 tablet  0  . lactulose (CHRONULAC) 10 GM/15ML solution Take 30 mLs (20 g total) by mouth 2 (two) times daily as needed for mild constipation. 1892 mL 3  . Multiple Vitamins-Minerals (CENTRUM PO) Take 1 tablet by mouth every evening.    . traZODone (DESYREL) 100 MG tablet Take 1 tablet (100 mg total) by mouth at bedtime as needed for sleep. 90 tablet 1   No current facility-administered medications for this visit.     Musculoskeletal: Strength & Muscle Tone: within normal limits Gait & Station: normal Patient leans: N/A  Psychiatric Specialty Exam: Review of Systems  Gastrointestinal: Positive for abdominal pain.  All other systems reviewed and are negative.   There were no vitals taken for this visit.There is no height or weight on file to calculate BMI.  General Appearance: NA  Eye Contact:  NA  Speech:  Clear and Coherent  Volume:  Normal  Mood:  Euthymic  Affect:  NA  Thought Process:  Goal Directed  Orientation:  Full (Time, Place, and Person)  Thought Content: Rumination   Suicidal Thoughts:  No  Homicidal Thoughts:  No  Memory:  Immediate;   Good Recent;   Good Remote;   Good  Judgement:  Good  Insight:  Fair  Psychomotor Activity:  Normal  Concentration:  Concentration: Good and Attention Span: Good  Recall:  Good  Fund of Knowledge: Good  Language: Good  Akathisia:  No  Handed:  Right  AIMS (if indicated): not done  Assets:  Communication Skills Desire for Improvement Resilience Social Support Talents/Skills  ADL's:  Intact  Cognition: WNL  Sleep:  Good   Screenings: AIMS     Admission (Discharged) from OP Visit from 03/10/2018 in [facility] Admission (Discharged) from 12/03/2016 in [facility]  AIMS Total Score  0  0    AUDIT     Admission (Discharged) from OP  Visit from 03/10/2018 in [facility] Admission (Discharged) from 12/03/2016 in [facility]  Alcohol Use Disorder Identification Test Final Score (AUDIT)  0  0    MDI     Office Visit from 11/15/2015 in [facility]  Total Score (max 50)  24    PHQ2-9     Office Visit from 01/14/2019 in [place] Office Visit from 07/07/2018 in [place] Office Visit from 10/27/2017 in [place] Office Visit from 01/19/2017 in [place] Office Visit from 12/10/2016 in [place]  PHQ-2 Total Score  2  0  0  4  4  PHQ-9 Total Score  8  --  --  17  15       Assessment and Plan: This patient is a 77-year-old female with a history of PTSD, depression and anxiety.  She seems to be stable currently.  She will continue Prozac 20 mg 3 times daily for depression, Xanax 1 mg 3 times daily as needed for anxiety and trazodone 100 mg at bedtime for sleep.  She will return to see me in 6 weeks   Kathleen Spiller, MD 08/26/2019, 10:32 AM

## 2019-08-30 ENCOUNTER — Other Ambulatory Visit: Payer: Self-pay | Admitting: *Deleted

## 2019-08-30 MED ORDER — HYDROCODONE-ACETAMINOPHEN 5-325 MG PO TABS: 1.0000 | ORAL_TABLET | Freq: Two times a day (BID) | ORAL | 0 refills | Status: DC | PRN

## 2019-08-30 NOTE — Telephone Encounter (Signed)
Received call from patient.   Requested refill on Hydrocodone/APAP.  Ok to refill??  Last office visit 01/14/2019.  Last refill 08/01/2019.

## 2019-09-02 ENCOUNTER — Telehealth: Payer: Self-pay | Admitting: *Deleted

## 2019-09-02 ENCOUNTER — Encounter: Payer: Self-pay | Admitting: Family Medicine

## 2019-09-02 ENCOUNTER — Ambulatory Visit (INDEPENDENT_AMBULATORY_CARE_PROVIDER_SITE_OTHER): Payer: Medicare Other | Admitting: Family Medicine

## 2019-09-02 ENCOUNTER — Other Ambulatory Visit: Payer: Self-pay

## 2019-09-02 VITALS — BP 128/64 | HR 60 | Temp 98.1°F | Resp 16 | Ht 63.0 in | Wt 170.0 lb

## 2019-09-02 DIAGNOSIS — K209 Esophagitis, unspecified without bleeding: Secondary | ICD-10-CM | POA: Diagnosis not present

## 2019-09-02 DIAGNOSIS — R8290 Unspecified abnormal findings in urine: Secondary | ICD-10-CM | POA: Diagnosis not present

## 2019-09-02 DIAGNOSIS — R569 Unspecified convulsions: Secondary | ICD-10-CM

## 2019-09-02 DIAGNOSIS — E876 Hypokalemia: Secondary | ICD-10-CM

## 2019-09-02 DIAGNOSIS — G894 Chronic pain syndrome: Secondary | ICD-10-CM | POA: Diagnosis not present

## 2019-09-02 DIAGNOSIS — Z932 Ileostomy status: Secondary | ICD-10-CM | POA: Diagnosis not present

## 2019-09-02 LAB — URINALYSIS, ROUTINE W REFLEX MICROSCOPIC
Bacteria, UA: NONE SEEN /HPF
Bilirubin Urine: NEGATIVE
Glucose, UA: NEGATIVE
Hyaline Cast: NONE SEEN /LPF
Ketones, ur: NEGATIVE
Leukocytes,Ua: NEGATIVE
Nitrite: NEGATIVE
Specific Gravity, Urine: 1.01 (ref 1.001–1.03)
WBC, UA: NONE SEEN /HPF (ref 0–5)
pH: 7 (ref 5.0–8.0)

## 2019-09-02 LAB — MICROSCOPIC MESSAGE

## 2019-09-02 MED ORDER — NITROFURANTOIN MONOHYD MACRO 100 MG PO CAPS
100.0000 mg | ORAL_CAPSULE | Freq: Two times a day (BID) | ORAL | 0 refills | Status: DC
Start: 2019-09-02 — End: 2020-07-06

## 2019-09-02 MED ORDER — OMEPRAZOLE 20 MG PO CPDR
20.0000 mg | DELAYED_RELEASE_CAPSULE | Freq: Every day | ORAL | 3 refills | Status: DC
Start: 2019-09-02 — End: 2019-09-19

## 2019-09-02 MED ORDER — POTASSIUM CHLORIDE CRYS ER 20 MEQ PO TBCR: 20.0000 meq | EXTENDED_RELEASE_TABLET | Freq: Every day | ORAL | 0 refills | Status: DC

## 2019-09-02 MED ORDER — CIPROFLOXACIN HCL 500 MG PO TABS
500.0000 mg | ORAL_TABLET | Freq: Two times a day (BID) | ORAL | 0 refills | Status: DC
Start: 2019-09-02 — End: 2019-09-02

## 2019-09-02 NOTE — Assessment & Plan Note (Signed)
Based on her blood in the urine we will go ahead and send for culture it appears to be fairly clean catch.  Start Macrobid and Cipro interfered with her hydrocodone and her trazodone

## 2019-09-02 NOTE — Telephone Encounter (Signed)
I have called in [place]

## 2019-09-02 NOTE — Progress Notes (Signed)
Subjective:    Patient ID: Kathleen Palmer, female    DOB: 1960-10-11, 59 y.o.   MRN: [id number]  Patient presents for Dysuria (x10 days- malodorous urine, ), Discolored Stool (x1 episode of bright green stool), and Seizures (was seen at ER for seizure)    Pt was having episodes of abdominal pain with the pain will wrap around her upper abdomen for the past few months.  The past few days however she has had odor  to her urine and her ostomy bag with no blood seen for the past few months . She had 1 bright  stool but no ongoing diarrhea, no blood in the stool.  She is also had vomiting almost daily for months now but did not like coming for a visit. Seen in emergency room recently for seizure she did have CT of abdomen pelvis done there was no acute abnormality acute diverticulosis, she had wall thickening of the distal esophagus concerning for esophagitis along with small hiatal hernia.     Seizure like episode. Her husband witness her shaking all over then she fell over and confused. She had CT scan that had no acute abnormality She states she was worried about her husband otherwise felt fine that day.  She is not taking any new medications.  She then stated that she been having increased pain and thought that maybe the pain caused her seizure. Labs reviewed she had hypokalemia  3.1  , calcium 8.7, AST  46  WBC 3.8  She has chronic pain, asking f/or higher dose of pain medication.  She has chronic pain from multiple pelvic surgeries.  She requests higher dose of hydrocodone which I declined based on her previous history where she was overtaking her medication.  She requests a referral to pain clinic which we have discussed in the past as well.     Review Of Systems:  GEN- denies fatigue, fever, weight loss,weakness, recent illness HEENT- denies eye drainage, change in vision, nasal discharge, CVS- denies chest pain, palpitations RESP- denies SOB, cough, wheeze ABD- + N/V, change in  stools, +abd pain GU- denies dysuria, hematuria, dribbling, incontinence MSK- denies joint pain, muscle aches, injury Neuro- denies headache, dizziness, syncope, seizure activity       Objective:    BP 128/64   Pulse 60   Temp 98.1 F (36.7 C) (Temporal)   Resp 16   Ht 5\' 3"  (1.6 m)   Wt 170 lb (77.1 kg)   SpO2 97%   BMI 30.11 kg/m  GEN- NAD, alert and oriented x3 HEENT- PERRL, EOMI, non injected sclera, pink conjunctiva, MMM, oropharynx clear Neck- Supple, no thyromegaly CVS- RRR, no murmur RESP-CTAB ABD-NABS,soft, ND, Urostomy in place, mild TTP across upper abdomen, no CVA tenderness, ventral hernia at left sided abdominal wall scar  NEURO-CNII-XII in tact no focal deficit  EXT- No edema Pulses- Radial, DP- 2+        Assessment & Plan:      Problem List Items Addressed This Visit      Unprioritized   Chronic pain syndrome    History of narcotic abuse back in 2018 2019 no formally keeping her on Lyrica since her medication.  Discussed pain management for now she would like to be referred states that her pain which is uncontrolled with the low-dose hydrocodone twice daily.  Has had multiple pelvic abdominal surgeries that did cause her pain.  She also smokes marijuana daily      Relevant Orders   Ambulatory  referral to Neurology   Ambulatory referral to Pain Clinic   Ileostomy status [facility])    Based on her blood in the urine we will go ahead and send for culture it appears to be fairly clean catch.  Start Macrobid and Cipro interfered with her hydrocodone and her trazodone      Relevant Orders   Ambulatory referral to Pain Clinic    Other Visit Diagnoses    Malodorous urine    -  Primary   Relevant Orders   Urinalysis, Routine w reflex microscopic (Completed)   Urine Culture   Esophagitis       History of hep C.  She also has history of gastritis and GERD.  Will start omeprazole 20 mg once a day due to her liver history referral back to GI   Relevant  Orders   Ambulatory referral to Gastroenterology   Hypokalemia       Seizure-like activity ([place])       Unclear cause.  No stroke seen on CT scan.  Reviewed ER note.  Referral to neurology for further evaluation EEG   Relevant Orders   Ambulatory referral to Neurology      Note: This dictation was prepared with Dragon dictation along with smaller phrase technology. Any transcriptional errors that result from this process are unintentional.

## 2019-09-02 NOTE — Assessment & Plan Note (Signed)
History of narcotic abuse back in 2018 2019 no formally keeping her on Lyrica since her medication.  Discussed pain management for now she would like to be referred states that her pain which is uncontrolled with the low-dose hydrocodone twice daily.  Has had multiple pelvic abdominal surgeries that did cause her pain.  She also smokes marijuana daily

## 2019-09-02 NOTE — Telephone Encounter (Signed)
Received call from [facility].   Reports that Cipro has interaction with Hydrocodone (increased hydrocodone levels and toxicity) and Trazodone (increased QTC interval).   MD please advise.

## 2019-09-02 NOTE — Patient Instructions (Addendum)
Start antibiotics Take potassium for 3 days  Take stomach pill once a day  REferral to Neurology for seizures and pain management Referral to GI for Esophagitis -inflammation of esophagus  F/U End of September for Physical

## 2019-09-03 LAB — URINE CULTURE
MICRO NUMBER:: 10452705
SPECIMEN QUALITY:: ADEQUATE

## 2019-09-05 ENCOUNTER — Encounter: Payer: Self-pay | Admitting: Gastroenterology

## 2019-09-16 ENCOUNTER — Other Ambulatory Visit: Payer: Self-pay | Admitting: Family Medicine

## 2019-09-28 ENCOUNTER — Ambulatory Visit: Payer: Medicare Other | Admitting: Gastroenterology

## 2019-09-30 ENCOUNTER — Other Ambulatory Visit: Payer: Self-pay | Admitting: *Deleted

## 2019-09-30 MED ORDER — HYDROCODONE-ACETAMINOPHEN 5-325 MG PO TABS: 1.0000 | ORAL_TABLET | Freq: Two times a day (BID) | ORAL | 0 refills | Status: DC | PRN

## 2019-09-30 NOTE — Telephone Encounter (Signed)
Received call from patient.   Requested refill on Hydrocodone/APAP.  Ok to refill??  Last office visit 09/02/2019.  Last refill 08/30/2019.

## 2019-10-20 ENCOUNTER — Telehealth (HOSPITAL_COMMUNITY): Payer: Medicare Other | Admitting: Psychiatry

## 2019-10-24 ENCOUNTER — Encounter (HOSPITAL_COMMUNITY): Payer: Self-pay | Admitting: Psychiatry

## 2019-10-24 ENCOUNTER — Other Ambulatory Visit: Payer: Self-pay

## 2019-10-24 ENCOUNTER — Telehealth (INDEPENDENT_AMBULATORY_CARE_PROVIDER_SITE_OTHER): Payer: Medicare Other | Admitting: Psychiatry

## 2019-10-24 DIAGNOSIS — F332 Major depressive disorder, recurrent severe without psychotic features: Secondary | ICD-10-CM

## 2019-10-24 DIAGNOSIS — F4310 Post-traumatic stress disorder, unspecified: Secondary | ICD-10-CM

## 2019-10-24 MED ORDER — TRAZODONE HCL 100 MG PO TABS: 100.0000 mg | ORAL_TABLET | Freq: Every evening | ORAL | 1 refills | Status: DC | PRN

## 2019-10-24 MED ORDER — FLUOXETINE HCL 20 MG PO CAPS: 20.0000 mg | ORAL_CAPSULE | Freq: Three times a day (TID) | ORAL | 2 refills | Status: DC

## 2019-10-24 MED ORDER — ALPRAZOLAM 1 MG PO TABS: 1.0000 mg | ORAL_TABLET | Freq: Three times a day (TID) | ORAL | 2 refills | Status: DC | PRN

## 2019-10-24 NOTE — Progress Notes (Signed)
Virtual Visit via Telephone Note  I connected with Kathleen Palmer on 10/24/19 at  2:20 PM EDT by telephone and verified that I am speaking with the correct person using two identifiers.   I discussed the limitations, risks, security and privacy concerns of performing an evaluation and management service by telephone and the availability of in person appointments. I also discussed with the patient that there may be a patient responsible charge related to this service. The patient expressed understanding and agreed to proceed.    I discussed the assessment and treatment plan with the patient. The patient was provided an opportunity to ask questions and all were answered. The patient agreed with the plan and demonstrated an understanding of the instructions.   The patient was advised to call back or seek an in-person evaluation if the symptoms worsen or if the condition fails to improve as anticipated.  I provided 15 minutes of non-face-to-face time during this encounter. Location: Provider office, patient home  Levonne Spiller, MD  [facility] MD/PA/NP OP Progress Note  10/24/2019 2:33 PM Kathleen Palmer  MRN:  [id number]  Chief Complaint:  Chief Complaint    Depression; Anxiety; Follow-up     HPI: This patient is a 59 year old married white female lives with her husband in [place]. She is on disability. She has a history of depression, anxiety posttraumatic stress disorder as well as borderline personality traits.  The patient returns for follow-up after 2 months. She states that she got a small rescue dog and it is really helping her mood. She feels like she has "someone to take care of." For the most part her mood has been good and she denies significant depression or suicidal ideation. She had a seizure-like episode back in April but has not yet been to see a neurologist. She also has been referred to pain management and has not started with this either. She stated that the seizure episode happened  the day after she fell and hit her head in the bathtub. Her brain CT was negative. She is sleeping well with the trazodone and the Xanax continues to help her anxiety. Visit Diagnosis:    ICD-10-CM   1. Major depressive disorder, recurrent episode, severe with anxious distress ([place])  F33.2   2. PTSD (post-traumatic stress disorder)  F43.10     Past Psychiatric History: Several previous hospitalizations for depression and suicidal ideation, the last 1 being 2 years ago  Past Medical History:  Past Medical History:  Diagnosis Date  . Angiomyolipoma    Left kidney  . Anxiety   . Arthritis   . Attention to urostomy [facility])   . Chronic pain   . Depression   . Hepatitis C    ? Contracted through IVDA  . Panic 04/29/1999  . PTSD (post-traumatic stress disorder)   . Substance abuse ([place])    Remote history- cocaine, ETOH, Marijuana  . Thyroid disease     Past Surgical History:  Procedure Laterality Date  . ABDOMINAL HYSTERECTOMY    . ABDOMINAL SURGERY    . BIOPSY  01/27/2017   Procedure: BIOPSY;  Surgeon: Danie Binder, MD;  [facility];  Service: Endoscopy;;  gastric  . CHOLECYSTECTOMY    . COLONOSCOPY WITH PROPOFOL N/A 01/27/2017   Procedure: COLONOSCOPY WITH PROPOFOL;  Surgeon: Danie Binder, MD;  Location[facility];  Service: Endoscopy;  Laterality: N/A;  12:00pm  . ESOPHAGOGASTRODUODENOSCOPY (EGD) WITH PROPOFOL N/A 01/27/2017   Procedure: ESOPHAGOGASTRODUODENOSCOPY (EGD) WITH PROPOFOL;  Surgeon: Danie Binder, MD;  [facility];  Service: Endoscopy;  Laterality: N/A;  . HERNIA REPAIR     x2- abdomen  . ILEO LOOP CONDUIT    . 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 VAGINA RECONSTRUCTION SURGERY      Family Psychiatric History: see below  Family History:  Family History  Adopted: Yes  Problem Relation Age of Onset  . Depression  Mother   . Depression Sister   . Anxiety disorder Sister   . Bipolar disorder Sister   . Depression Maternal Grandmother   . ADD / ADHD Neg Hx   . Alcohol abuse Neg Hx   . Drug abuse Neg Hx   . Dementia Neg Hx   . OCD Neg Hx   . Paranoid behavior Neg Hx   . Schizophrenia Neg Hx   . Seizures Neg Hx   . Sexual abuse Neg Hx   . Physical abuse Neg Hx   . Suicidality Neg Hx   . Colon cancer Neg Hx     Social History:  Social History   Socioeconomic History  . Marital status: Married    Spouse name: Not on file  . Number of children: 0  . Years of education: Not on file  . Highest education level: Not on file  Occupational History  . Occupation: disability  Tobacco Use  . Smoking status: Former Smoker    Packs/day: 0.25    Years: 33.00    Pack years: 8.25    Types: Cigarettes    Quit date: 01/21/2015    Years since quitting: 4.7  . Smokeless tobacco: Never Used  . Tobacco comment: 9-10 cigs a day as of 10/20/2012, (02-07-15 per pt, she stopped smoking 01-21-15)  Vaping Use  . Vaping Use: Never used  Substance and Sexual Activity  . Alcohol use: No    Alcohol/week: 0.0 standard drinks  . Drug use: Not Currently    Types: Marijuana    Comment: quit 10 days   . Sexual activity: Yes    Birth control/protection: Surgical  Other Topics Concern  . Not on file  Social History Narrative  . Not on file   Social Determinants of Health   Financial Resource Strain:   . Difficulty of Paying Living Expenses:   Food Insecurity:   . Worried About Charity fundraiser in the Last Year:   . Arboriculturist in the Last Year:   Transportation Needs:   . Film/video editor (Medical):   Marland Kitchen Lack of Transportation (Non-Medical):   Physical Activity:   . Days of Exercise per Week:   . Minutes of Exercise per Session:   Stress:   . Feeling of Stress :   Social Connections:   . Frequency of Communication with Friends and Family:   . Frequency of Social Gatherings with Friends and  Family:   . Attends Religious Services:   . Active Member of Clubs or Organizations:   . Attends Archivist Meetings:   Marland Kitchen Marital Status:     Allergies:  Allergies  Allergen Reactions  . Oxycodone Anxiety  . Abilify [Aripiprazole]     Stiff neck  . Bactrim [Sulfamethoxazole-Trimethoprim]     Tongue swelled  . Gabapentin     Dizziness to the point she actually fell  . Ampicillin Rash    Has  patient had a PCN reaction causing immediate rash, facial/tongue/throat swelling, SOB or lightheadedness with hypotension: Unknown Has patient had a PCN reaction causing severe rash involving mucus membranes or skin necrosis: Unknown Has patient had a PCN reaction that required hospitalization: No Has patient had a PCN reaction occurring within the last 10 years: No If all of the above answers are "NO", then may proceed with Cephalosporin use.      Metabolic Disorder Labs: Lab Results  Component Value Date   HGBA1C 5.2 03/10/2018   MPG 102.54 03/10/2018   MPG 102.54 12/05/2016   No results found for: PROLACTIN Lab Results  Component Value Date   CHOL 123 01/14/2019   TRIG 90 01/14/2019   HDL 36 (L) 01/14/2019   CHOLHDL 3.4 01/14/2019   VLDL 16 03/10/2018   LDLCALC 70 01/14/2019   LDLCALC 84 03/10/2018   Lab Results  Component Value Date   TSH 0.87 01/14/2019   TSH 14.938 (H) 03/10/2018    Therapeutic Level Labs: No results found for: LITHIUM No results found for: VALPROATE No components found for:  CBMZ  Current Medications: Current Outpatient Medications  Medication Sig Dispense Refill  . Acetaminophen-Caffeine (EXCEDRIN ASPIRIN FREE PO) Take 2 tablets by mouth daily as needed.    . ALPRAZolam (XANAX) 1 MG tablet Take 1 tablet (1 mg total) by mouth 3 (three) times daily as needed for anxiety. 90 tablet 2  . Cyanocobalamin (VITAMIN B 12) 100 MCG LOZG Take by mouth.    Marland Kitchen FLUoxetine (PROZAC) 20 MG capsule Take 1 capsule (20 mg total) by mouth 3 (three) times  daily. 90 capsule 2  . HYDROcodone-acetaminophen (NORCO) 5-325 MG tablet Take 1 tablet by mouth 2 (two) times daily as needed for moderate pain. 60 tablet 0  . lactulose (CHRONULAC) 10 GM/15ML solution Take 30 mLs (20 g total) by mouth 2 (two) times daily as needed for mild constipation. 1892 mL 3  . Multiple Vitamins-Minerals (CENTRUM PO) Take 1 tablet by mouth every evening.    . nitrofurantoin, macrocrystal-monohydrate, (MACROBID) 100 MG capsule Take 1 capsule (100 mg total) by mouth 2 (two) times daily. 14 capsule 0  . omeprazole (PRILOSEC) 20 MG capsule TAKE 1 CAPSULE BY MOUTH DAILY. FOR ACID REFLUX 90 capsule 1  . potassium chloride SA (KLOR-CON) 20 MEQ tablet Take 1 tablet (20 mEq total) by mouth daily. FOR 3 DAYS 3 tablet 0  . traZODone (DESYREL) 100 MG tablet Take 1 tablet (100 mg total) by mouth at bedtime as needed for sleep. 90 tablet 1   No current facility-administered medications for this visit.     Musculoskeletal: Strength & Muscle Tone: within normal limits Gait & Station: normal Patient leans: N/A  Psychiatric Specialty Exam: Review of Systems  Gastrointestinal: Positive for abdominal pain.  All other systems reviewed and are negative.   There were no vitals taken for this visit.There is no height or weight on file to calculate BMI.  General Appearance: NA  Eye Contact:  NA  Speech:  Clear and Coherent  Volume:  Normal  Mood:  Euthymic  Affect:  NA  Thought Process:  Goal Directed  Orientation:  Full (Time, Place, and Person)  Thought Content: Rumination   Suicidal Thoughts:  No  Homicidal Thoughts:  No  Memory:  Immediate;   Good Recent;   Good Remote;   Fair  Judgement:  Fair  Insight:  Fair  Psychomotor Activity:  Normal  Concentration:  Concentration: Good and Attention Span: Good  Recall:  Good  Fund of Knowledge: Good  Language: Good  Akathisia:  No  Handed:  Right  AIMS (if indicated): not done  Assets:  Communication Skills Desire for  Improvement Resilience Social Support Talents/Skills  ADL's:  Intact  Cognition: WNL  Sleep:  Good   Screenings: AIMS     Admission (Discharged) from OP Visit from 03/10/2018 in [facility] [facility] Admission (Discharged) from 12/03/2016 in [place] [facility]  AIMS Total Score 0 0    AUDIT     Admission (Discharged) from OP Visit from 03/10/2018 in [place] [facility] Admission (Discharged) from 12/03/2016 in [place] [facility]  Alcohol Use Disorder Identification Test Final Score (AUDIT) 0 0    MDI     Office Visit from 11/15/2015 in [facility]  Total Score (max 50) 24    PHQ2-9     Office Visit from 09/02/2019 in [place] Office Visit from 01/14/2019 in [place] Office Visit from 07/07/2018 in [place] Office Visit from 10/27/2017 in [place] Office Visit from 01/19/2017 in [place]  PHQ-2 Total Score 2 2 0 0 4  PHQ-9 Total Score 3 8 -- -- 17       Assessment and Plan: This patient is a 59 year old female with a history of PTSD depression and anxiety. She continues to be stable. She will continue Prozac 20 mg 3 times daily for depression, Xanax 1 mg 3 times daily as needed for anxiety and trazodone 100 mg at bedtime for sleep. She will return to see me in 2 months   Levonne Spiller, MD 10/24/2019, 2:33 PM

## 2019-10-31 ENCOUNTER — Other Ambulatory Visit: Payer: Self-pay | Admitting: Family Medicine

## 2019-10-31 NOTE — Telephone Encounter (Signed)
Patient left vm requesting a refill on her hydrocodone.  CB# [phone]

## 2019-11-01 MED ORDER — HYDROCODONE-ACETAMINOPHEN 5-325 MG PO TABS: 1.0000 | ORAL_TABLET | Freq: Two times a day (BID) | ORAL | 0 refills | Status: DC | PRN

## 2019-11-01 NOTE — Telephone Encounter (Signed)
Ok to refill??  Last office visit 09/02/2019.  Last refill 09/30/2019.

## 2019-11-02 ENCOUNTER — Ambulatory Visit: Payer: Medicare Other | Admitting: Gastroenterology

## 2019-11-14 DIAGNOSIS — K7469 Other cirrhosis of liver: Secondary | ICD-10-CM | POA: Diagnosis not present

## 2019-11-14 DIAGNOSIS — B182 Chronic viral hepatitis C: Secondary | ICD-10-CM | POA: Diagnosis not present

## 2019-11-17 ENCOUNTER — Encounter (HOSPITAL_COMMUNITY): Payer: Self-pay | Admitting: Psychiatry

## 2019-11-17 ENCOUNTER — Telehealth (INDEPENDENT_AMBULATORY_CARE_PROVIDER_SITE_OTHER): Payer: Medicare Other | Admitting: Psychiatry

## 2019-11-17 ENCOUNTER — Other Ambulatory Visit: Payer: Self-pay

## 2019-11-17 DIAGNOSIS — F332 Major depressive disorder, recurrent severe without psychotic features: Secondary | ICD-10-CM | POA: Diagnosis not present

## 2019-11-17 DIAGNOSIS — F4310 Post-traumatic stress disorder, unspecified: Secondary | ICD-10-CM | POA: Diagnosis not present

## 2019-11-17 MED ORDER — ALPRAZOLAM 1 MG PO TABS: 1.0000 mg | ORAL_TABLET | Freq: Three times a day (TID) | ORAL | 2 refills | Status: DC | PRN

## 2019-11-17 MED ORDER — FLUOXETINE HCL 20 MG PO CAPS: 20.0000 mg | ORAL_CAPSULE | Freq: Three times a day (TID) | ORAL | 2 refills | Status: DC

## 2019-11-17 NOTE — Progress Notes (Signed)
Virtual Visit via Telephone Note  I connected with Kathleen Palmer on 11/17/19 at  3:20 PM EDT by telephone and verified that I am speaking with the correct person using two identifiers.   I discussed the limitations, risks, security and privacy concerns of performing an evaluation and management service by telephone and the availability of in person appointments. I also discussed with the patient that there may be a patient responsible charge related to this service. The patient expressed understanding and agreed to proceed.     I discussed the assessment and treatment plan with the patient. The patient was provided an opportunity to ask questions and all were answered. The patient agreed with the plan and demonstrated an understanding of the instructions.   The patient was advised to call back or seek an in-person evaluation if the symptoms worsen or if the condition fails to improve as anticipated.  I provided 15 minutes of non-face-to-face time during this encounter.  Location: Provider Home, patient home   Levonne Spiller, MD  [facility] MD/PA/NP OP Progress Note  11/17/2019 4:03 PM Kathleen Palmer  MRN:  [id number]  Chief Complaint:  Chief Complaint    Anxiety; Depression; Follow-up     HPI: This patient is a 59 year old female who lives with her husband in [place].  She is on disability.  She has a history of depression, anxiety, posttraumatic stress disorder as well as borderline traits.  The patient returns for follow-up after 4 weeks.  She states that for the last couple of weeks she has been feeling very down and having very negative thoughts.  She states that she would never harm herself but was feeling overwhelmed by past traumatic memories.  She decided to stop the trazodone because for some reason she thinks is related to this.  She states she has been off it for about 5 days and she is feeling much better.  She is no longer having the negative thoughts.  On the downside she is not  sleeping well without the medication.  I suggested she try melatonin 5 to 10 mg and she is willing to give this a try.  She has not had any further seizures. Visit Diagnosis:    ICD-10-CM   1. Major depressive disorder, recurrent episode, severe with anxious distress ([place])  F33.2   2. PTSD (post-traumatic stress disorder)  F43.10     Past Psychiatric History: Several previous hospitalizations for depression and suicidal ideation, the last 1 being 2 years ago  Past Medical History:  Past Medical History:  Diagnosis Date  . Angiomyolipoma    Left kidney  . Anxiety   . Arthritis   . Attention to urostomy [facility])   . Chronic pain   . Depression   . Hepatitis C    ? Contracted through IVDA  . Panic 04/29/1999  . PTSD (post-traumatic stress disorder)   . Substance abuse ([place])    Remote history- cocaine, ETOH, Marijuana  . Thyroid disease     Past Surgical History:  Procedure Laterality Date  . ABDOMINAL HYSTERECTOMY    . ABDOMINAL SURGERY    . BIOPSY  01/27/2017   Procedure: BIOPSY;  Surgeon: Danie Binder, MD;  [facility];  Service: Endoscopy;;  gastric  . CHOLECYSTECTOMY    . COLONOSCOPY WITH PROPOFOL N/A 01/27/2017   Procedure: COLONOSCOPY WITH PROPOFOL;  Surgeon: Danie Binder, MD;  [facility];  Service: Endoscopy;  Laterality: N/A;  12:00pm  . ESOPHAGOGASTRODUODENOSCOPY (EGD) WITH PROPOFOL  N/A 01/27/2017   Procedure: ESOPHAGOGASTRODUODENOSCOPY (EGD) WITH PROPOFOL;  Surgeon: Danie Binder, MD;  [facility];  Service: Endoscopy;  Laterality: N/A;  . HERNIA REPAIR     x2- abdomen  . ILEO LOOP CONDUIT    . 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 VAGINA RECONSTRUCTION SURGERY      Family Psychiatric History: see below  Family History:  Family History  Adopted: Yes  Problem Relation Age of Onset  . Depression Mother   .  Depression Sister   . Anxiety disorder Sister   . Bipolar disorder Sister   . Depression Maternal Grandmother   . ADD / ADHD Neg Hx   . Alcohol abuse Neg Hx   . Drug abuse Neg Hx   . Dementia Neg Hx   . OCD Neg Hx   . Paranoid behavior Neg Hx   . Schizophrenia Neg Hx   . Seizures Neg Hx   . Sexual abuse Neg Hx   . Physical abuse Neg Hx   . Suicidality Neg Hx   . Colon cancer Neg Hx     Social History:  Social History   Socioeconomic History  . Marital status: Married    Spouse name: Not on file  . Number of children: 0  . Years of education: Not on file  . Highest education level: Not on file  Occupational History  . Occupation: disability  Tobacco Use  . Smoking status: Former Smoker    Packs/day: 0.25    Years: 33.00    Pack years: 8.25    Types: Cigarettes    Quit date: 01/21/2015    Years since quitting: 4.8  . Smokeless tobacco: Never Used  . Tobacco comment: 9-10 cigs a day as of 10/20/2012, (02-07-15 per pt, she stopped smoking 01-21-15)  Vaping Use  . Vaping Use: Never used  Substance and Sexual Activity  . Alcohol use: No    Alcohol/week: 0.0 standard drinks  . Drug use: Not Currently    Types: Marijuana    Comment: quit 10 days   . Sexual activity: Yes    Birth control/protection: Surgical  Other Topics Concern  . Not on file  Social History Narrative  . Not on file   Social Determinants of Health   Financial Resource Strain:   . Difficulty of Paying Living Expenses:   Food Insecurity:   . Worried About Charity fundraiser in the Last Year:   . Arboriculturist in the Last Year:   Transportation Needs:   . Film/video editor (Medical):   Marland Kitchen Lack of Transportation (Non-Medical):   Physical Activity:   . Days of Exercise per Week:   . Minutes of Exercise per Session:   Stress:   . Feeling of Stress :   Social Connections:   . Frequency of Communication with Friends and Family:   . Frequency of Social Gatherings with Friends and Family:    . Attends Religious Services:   . Active Member of Clubs or Organizations:   . Attends Archivist Meetings:   Marland Kitchen Marital Status:     Allergies:  Allergies  Allergen Reactions  . Oxycodone Anxiety  . Abilify [Aripiprazole]     Stiff neck  . Bactrim [Sulfamethoxazole-Trimethoprim]     Tongue swelled  . Gabapentin     Dizziness to the point she  actually fell  . Ampicillin Rash    Has patient had a PCN reaction causing immediate rash, facial/tongue/throat swelling, SOB or lightheadedness with hypotension: Unknown Has patient had a PCN reaction causing severe rash involving mucus membranes or skin necrosis: Unknown Has patient had a PCN reaction that required hospitalization: No Has patient had a PCN reaction occurring within the last 10 years: No If all of the above answers are "NO", then may proceed with Cephalosporin use.      Metabolic Disorder Labs: Lab Results  Component Value Date   HGBA1C 5.2 03/10/2018   MPG 102.54 03/10/2018   MPG 102.54 12/05/2016   No results found for: PROLACTIN Lab Results  Component Value Date   CHOL 123 01/14/2019   TRIG 90 01/14/2019   HDL 36 (L) 01/14/2019   CHOLHDL 3.4 01/14/2019   VLDL 16 03/10/2018   LDLCALC 70 01/14/2019   LDLCALC 84 03/10/2018   Lab Results  Component Value Date   TSH 0.87 01/14/2019   TSH 14.938 (H) 03/10/2018    Therapeutic Level Labs: No results found for: LITHIUM No results found for: VALPROATE No components found for:  CBMZ  Current Medications: Current Outpatient Medications  Medication Sig Dispense Refill  . Acetaminophen-Caffeine (EXCEDRIN ASPIRIN FREE PO) Take 2 tablets by mouth daily as needed.    . ALPRAZolam (XANAX) 1 MG tablet Take 1 tablet (1 mg total) by mouth 3 (three) times daily as needed for anxiety. 90 tablet 2  . Cyanocobalamin (VITAMIN B 12) 100 MCG LOZG Take by mouth.    Marland Kitchen FLUoxetine (PROZAC) 20 MG capsule Take 1 capsule (20 mg total) by mouth 3 (three) times daily. 90  capsule 2  . HYDROcodone-acetaminophen (NORCO) 5-325 MG tablet Take 1 tablet by mouth 2 (two) times daily as needed for moderate pain. 60 tablet 0  . lactulose (CHRONULAC) 10 GM/15ML solution Take 30 mLs (20 g total) by mouth 2 (two) times daily as needed for mild constipation. 1892 mL 3  . Multiple Vitamins-Minerals (CENTRUM PO) Take 1 tablet by mouth every evening.    . nitrofurantoin, macrocrystal-monohydrate, (MACROBID) 100 MG capsule Take 1 capsule (100 mg total) by mouth 2 (two) times daily. 14 capsule 0  . omeprazole (PRILOSEC) 20 MG capsule TAKE 1 CAPSULE BY MOUTH DAILY. FOR ACID REFLUX 90 capsule 1  . potassium chloride SA (KLOR-CON) 20 MEQ tablet Take 1 tablet (20 mEq total) by mouth daily. FOR 3 DAYS 3 tablet 0   No current facility-administered medications for this visit.     Musculoskeletal: Strength & Muscle Tone: within normal limits Gait & Station: normal Patient leans: N/A  Psychiatric Specialty Exam: Review of Systems  Psychiatric/Behavioral: Positive for sleep disturbance.  All other systems reviewed and are negative.   There were no vitals taken for this visit.There is no height or weight on file to calculate BMI.  General Appearance: NA  Eye Contact:  NA  Speech:  Clear and Coherent  Volume:  Normal  Mood:  Euthymic  Affect:  NA  Thought Process:  Goal Directed  Orientation:  Full (Time, Place, and Person)  Thought Content: WDL   Suicidal Thoughts:  No  Homicidal Thoughts:  No  Memory:  Immediate;   Good Recent;   Good Remote;   Good  Judgement:  Fair  Insight:  Fair  Psychomotor Activity:  Normal  Concentration:  Concentration: Good and Attention Span: Good  Recall:  Good  Fund of Knowledge: Good  Language: Good  Akathisia:  No  Handed:  Right  AIMS (if indicated): not done  Assets:  Communication Skills Desire for Improvement Resilience Social Support Talents/Skills  ADL's:  Intact  Cognition: WNL  Sleep:  Fair   Screenings: AIMS      Admission (Discharged) from OP Visit from 03/10/2018 in [facility] [facility] Admission (Discharged) from 12/03/2016 in [facility] [place] [facility]  AIMS Total Score 0 0    AUDIT     Admission (Discharged) from OP Visit from 03/10/2018 in [place] [facility] Admission (Discharged) from 12/03/2016 in [place] [facility]  Alcohol Use Disorder Identification Test Final Score (AUDIT) 0 0    MDI     Office Visit from 11/15/2015 in [facility]  Total Score (max 50) 24    PHQ2-9     Office Visit from 09/02/2019 in [place] Office Visit from 01/14/2019 in [facility] Office Visit from 07/07/2018 in [place] Office Visit from 10/27/2017 in [facility] Office Visit from 01/19/2017 in [facility]  PHQ-2 Total Score 2 2 0 0 4  PHQ-9 Total Score 3 8 -- -- 17       Assessment and Plan: This patient is a 59 year old female with a history of PTSD depression and anxiety.  She felt like the trazodone was causing her to have negative thoughts and she feels better without it.  Since she is not sleeping as well without it she has been advised to start melatonin 5 to 10 mg at bedtime.  She will continue Prozac 20 mg 3 times daily for depression and Xanax 1 mg 3 times daily as needed for anxiety.  She will return to see me in 4 weeks   Levonne Spiller, MD 11/17/2019, 4:03 PM

## 2019-11-18 ENCOUNTER — Ambulatory Visit (HOSPITAL_COMMUNITY)
Admission: RE | Admit: 2019-11-18 | Discharge: 2019-11-18 | Disposition: A | Discharge status: Home / Self Care | Payer: Medicare Other | Source: Ambulatory Visit | Attending: Nurse Practitioner | Admitting: Nurse Practitioner

## 2019-11-18 ENCOUNTER — Other Ambulatory Visit: Payer: Self-pay

## 2019-11-18 DIAGNOSIS — K7460 Unspecified cirrhosis of liver: Secondary | ICD-10-CM | POA: Diagnosis not present

## 2019-11-18 DIAGNOSIS — K7469 Other cirrhosis of liver: Secondary | ICD-10-CM | POA: Insufficient documentation

## 2019-11-18 DIAGNOSIS — Z9049 Acquired absence of other specified parts of digestive tract: Secondary | ICD-10-CM | POA: Diagnosis not present

## 2019-11-18 DIAGNOSIS — R161 Splenomegaly, not elsewhere classified: Secondary | ICD-10-CM | POA: Diagnosis not present

## 2019-11-18 DIAGNOSIS — Q600 Renal agenesis, unilateral: Secondary | ICD-10-CM | POA: Diagnosis not present

## 2019-11-25 DIAGNOSIS — Z23 Encounter for immunization: Secondary | ICD-10-CM | POA: Diagnosis not present

## 2019-11-28 ENCOUNTER — Other Ambulatory Visit: Payer: Self-pay | Admitting: *Deleted

## 2019-11-28 MED ORDER — HYDROCODONE-ACETAMINOPHEN 5-325 MG PO TABS: 1.0000 | ORAL_TABLET | Freq: Two times a day (BID) | ORAL | 0 refills | Status: DC | PRN

## 2019-11-28 NOTE — Telephone Encounter (Signed)
Received call from patient.   Requested refill on Hydrocodone/APAP.  Ok to refill??  Last office visit 09/02/2019.  Last refill 11/01/2019.

## 2019-12-07 DIAGNOSIS — K7290 Hepatic failure, unspecified without coma: Secondary | ICD-10-CM | POA: Diagnosis not present

## 2019-12-07 DIAGNOSIS — B182 Chronic viral hepatitis C: Secondary | ICD-10-CM | POA: Diagnosis not present

## 2019-12-07 DIAGNOSIS — K7469 Other cirrhosis of liver: Secondary | ICD-10-CM | POA: Diagnosis not present

## 2019-12-27 DIAGNOSIS — Z23 Encounter for immunization: Secondary | ICD-10-CM | POA: Diagnosis not present

## 2019-12-28 ENCOUNTER — Other Ambulatory Visit: Payer: Self-pay | Admitting: *Deleted

## 2019-12-28 ENCOUNTER — Encounter (HOSPITAL_COMMUNITY): Payer: Self-pay | Admitting: Psychiatry

## 2019-12-28 ENCOUNTER — Telehealth (INDEPENDENT_AMBULATORY_CARE_PROVIDER_SITE_OTHER): Payer: Medicare Other | Admitting: Psychiatry

## 2019-12-28 ENCOUNTER — Other Ambulatory Visit: Payer: Self-pay

## 2019-12-28 DIAGNOSIS — F332 Major depressive disorder, recurrent severe without psychotic features: Secondary | ICD-10-CM

## 2019-12-28 DIAGNOSIS — F4310 Post-traumatic stress disorder, unspecified: Secondary | ICD-10-CM

## 2019-12-28 MED ORDER — HYDROCODONE-ACETAMINOPHEN 5-325 MG PO TABS: 1.0000 | ORAL_TABLET | Freq: Two times a day (BID) | ORAL | 0 refills | Status: DC | PRN

## 2019-12-28 MED ORDER — TRAZODONE HCL 100 MG PO TABS: 100.0000 mg | ORAL_TABLET | Freq: Every evening | ORAL | 1 refills | Status: DC | PRN

## 2019-12-28 MED ORDER — FLUOXETINE HCL 20 MG PO CAPS: 20.0000 mg | ORAL_CAPSULE | Freq: Two times a day (BID) | ORAL | 2 refills | Status: DC

## 2019-12-28 MED ORDER — ALPRAZOLAM 1 MG PO TABS: 1.0000 mg | ORAL_TABLET | Freq: Three times a day (TID) | ORAL | 2 refills | Status: DC | PRN
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
## 2019-12-28 NOTE — Telephone Encounter (Signed)
Received call from patient.   Requested refill on Hydrocodone/APAP.    Ok to refill??  Last office visit 09/02/2019.  Last refill 11/28/2019.

## 2020-01-13 ENCOUNTER — Other Ambulatory Visit (HOSPITAL_COMMUNITY): Payer: Self-pay | Admitting: Psychiatry

## 2020-01-13 ENCOUNTER — Telehealth (HOSPITAL_COMMUNITY): Payer: Self-pay | Admitting: *Deleted

## 2020-01-13 MED ORDER — TRAZODONE HCL 100 MG PO TABS: 200.0000 mg | ORAL_TABLET | Freq: Every evening | ORAL | 1 refills | Status: DC | PRN

## 2020-01-13 NOTE — Telephone Encounter (Signed)
Spoke with patient and she said she think so got off on her Trazodone and need help going back to the correct formation of her Trazodone. Per pt she was taking 2,1,2,1 and then now she's back to taking 2. Per pt she would like a call back.

## 2020-01-13 NOTE — Telephone Encounter (Signed)
Spoke to pt, she has been taking 200 mg, quantity changed

## 2020-01-13 NOTE — Telephone Encounter (Signed)
Noted  

## 2020-01-24 ENCOUNTER — Other Ambulatory Visit: Payer: Self-pay

## 2020-01-24 ENCOUNTER — Telehealth (INDEPENDENT_AMBULATORY_CARE_PROVIDER_SITE_OTHER): Payer: Medicare Other | Admitting: Psychiatry

## 2020-01-24 ENCOUNTER — Encounter (HOSPITAL_COMMUNITY): Payer: Self-pay | Admitting: Psychiatry

## 2020-01-24 DIAGNOSIS — F4310 Post-traumatic stress disorder, unspecified: Secondary | ICD-10-CM

## 2020-01-24 DIAGNOSIS — F332 Major depressive disorder, recurrent severe without psychotic features: Secondary | ICD-10-CM | POA: Diagnosis not present

## 2020-01-24 MED ORDER — TRAZODONE HCL 100 MG PO TABS: 200.0000 mg | ORAL_TABLET | Freq: Every evening | ORAL | 1 refills | Status: DC | PRN

## 2020-01-24 MED ORDER — ALPRAZOLAM 1 MG PO TABS: 1.0000 mg | ORAL_TABLET | Freq: Three times a day (TID) | ORAL | 2 refills | Status: DC | PRN

## 2020-01-24 MED ORDER — FLUOXETINE HCL 20 MG PO CAPS: 20.0000 mg | ORAL_CAPSULE | Freq: Two times a day (BID) | ORAL | 2 refills | Status: DC

## 2020-01-24 NOTE — Progress Notes (Signed)
Virtual Visit via Telephone Note  I connected with Kathleen Palmer on 01/24/20 at 11:00 AM EDT by telephone and verified that I am speaking with the correct person using two identifiers.   I discussed the limitations, risks, security and privacy concerns of performing an evaluation and management service by telephone and the availability of in person appointments. I also discussed with the patient that there may be a patient responsible charge related to this service. The patient expressed understanding and agreed to proceed.   I discussed the assessment and treatment plan with the patient. The patient was provided an opportunity to ask questions and all were answered. The patient agreed with the plan and demonstrated an understanding of the instructions.   The patient was advised to call back or seek an in-person evaluation if the symptoms worsen or if the condition fails to improve as anticipated.  I provided 15 minutes of non-face-to-face time during this encounter. Location: Provider office, patient home  Levonne Spiller, MD  [facility] MD/PA/NP OP Progress Note  01/24/2020 11:05 AM Kathleen Palmer  MRN:  [id number]  Chief Complaint:  Chief Complaint    Depression; Anxiety; Follow-up     HPI: This patient is a 59 year old female who lives with her husband in [place].  She is on disability.  She has a history of depression anxiety posttraumatic stress disorder as well as borderline traits.  The patient returns for follow-up after 4 weeks.  She states that she is feeling better.  Last time she felt upset and overwhelmed.  She still has trouble getting through the grief regarding her sister's death.  I urged her to think about counseling but she declined at this time.  We had cut down the Prozac and she seems to be doing well on the 40 mg.  She denies feeling confused or drugged anymore and she denies thoughts of self-harm or suicide.  She is sleeping better with the trazodone.  Her anxiety is fairly  well controlled with the Xanax and she enjoys time with her dog Visit Diagnosis:    ICD-10-CM   1. Major depressive disorder, recurrent episode, severe with anxious distress ([place])  F33.2   2. PTSD (post-traumatic stress disorder)  F43.10     Past Psychiatric History: Several previous hospitalizations for depression and suicidal ideation, the last 1 being 2 years ago  Past Medical History:  Past Medical History:  Diagnosis Date  . Angiomyolipoma    Left kidney  . Anxiety   . Arthritis   . Attention to urostomy [facility])   . Chronic pain   . Depression   . Hepatitis C    ? Contracted through IVDA  . Panic 04/29/1999  . PTSD (post-traumatic stress disorder)   . Substance abuse ([place])    Remote history- cocaine, ETOH, Marijuana  . Thyroid disease     Past Surgical History:  Procedure Laterality Date  . ABDOMINAL HYSTERECTOMY    . ABDOMINAL SURGERY    . BIOPSY  01/27/2017   Procedure: BIOPSY;  Surgeon: Danie Binder, MD;  [facility];  Service: Endoscopy;;  gastric  . CHOLECYSTECTOMY    . COLONOSCOPY WITH PROPOFOL N/A 01/27/2017   Procedure: COLONOSCOPY WITH PROPOFOL;  Surgeon: Danie Binder, MD;  [facility];  Service: Endoscopy;  Laterality: N/A;  12:00pm  . ESOPHAGOGASTRODUODENOSCOPY (EGD) WITH PROPOFOL N/A 01/27/2017   Procedure: ESOPHAGOGASTRODUODENOSCOPY (EGD) WITH PROPOFOL;  Surgeon: Danie Binder, MD;  [facility];  Service: Endoscopy;  Laterality: N/A;  . HERNIA REPAIR     x2- abdomen  . ILEO LOOP CONDUIT    . 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 VAGINA RECONSTRUCTION SURGERY      Family Psychiatric History: see below  Family History:  Family History  Adopted: Yes  Problem Relation Age of Onset  . Depression Mother   . Depression Sister   . Anxiety disorder Sister   . Bipolar disorder Sister   . Depression  Maternal Grandmother   . ADD / ADHD Neg Hx   . Alcohol abuse Neg Hx   . Drug abuse Neg Hx   . Dementia Neg Hx   . OCD Neg Hx   . Paranoid behavior Neg Hx   . Schizophrenia Neg Hx   . Seizures Neg Hx   . Sexual abuse Neg Hx   . Physical abuse Neg Hx   . Suicidality Neg Hx   . Colon cancer Neg Hx     Social History:  Social History   Socioeconomic History  . Marital status: Married    Spouse name: Not on file  . Number of children: 0  . Years of education: Not on file  . Highest education level: Not on file  Occupational History  . Occupation: disability  Tobacco Use  . Smoking status: Former Smoker    Packs/day: 0.25    Years: 33.00    Pack years: 8.25    Types: Cigarettes    Quit date: 01/21/2015    Years since quitting: 5.0  . Smokeless tobacco: Never Used  . Tobacco comment: 9-10 cigs a day as of 10/20/2012, (02-07-15 per pt, she stopped smoking 01-21-15)  Vaping Use  . Vaping Use: Never used  Substance and Sexual Activity  . Alcohol use: No    Alcohol/week: 0.0 standard drinks  . Drug use: Not Currently    Types: Marijuana    Comment: quit 10 days   . Sexual activity: Yes    Birth control/protection: Surgical  Other Topics Concern  . Not on file  Social History Narrative  . Not on file   Social Determinants of Health   Financial Resource Strain:   . Difficulty of Paying Living Expenses: Not on file  Food Insecurity:   . Worried About Charity fundraiser in the Last Year: Not on file  . Ran Out of Food in the Last Year: Not on file  Transportation Needs:   . Lack of Transportation (Medical): Not on file  . Lack of Transportation (Non-Medical): Not on file  Physical Activity:   . Days of Exercise per Week: Not on file  . Minutes of Exercise per Session: Not on file  Stress:   . Feeling of Stress : Not on file  Social Connections:   . Frequency of Communication with Friends and Family: Not on file  . Frequency of Social Gatherings with Friends and  Family: Not on file  . Attends Religious Services: Not on file  . Active Member of Clubs or Organizations: Not on file  . Attends Archivist Meetings: Not on file  . Marital Status: Not on file    Allergies:  Allergies  Allergen Reactions  . Oxycodone Anxiety  . Abilify [Aripiprazole]     Stiff neck  . Bactrim [Sulfamethoxazole-Trimethoprim]     Tongue swelled  . Gabapentin     Dizziness  to the point she actually fell  . Ampicillin Rash    Has patient had a PCN reaction causing immediate rash, facial/tongue/throat swelling, SOB or lightheadedness with hypotension: Unknown Has patient had a PCN reaction causing severe rash involving mucus membranes or skin necrosis: Unknown Has patient had a PCN reaction that required hospitalization: No Has patient had a PCN reaction occurring within the last 10 years: No If all of the above answers are "NO", then may proceed with Cephalosporin use.      Metabolic Disorder Labs: Lab Results  Component Value Date   HGBA1C 5.2 03/10/2018   MPG 102.54 03/10/2018   MPG 102.54 12/05/2016   No results found for: PROLACTIN Lab Results  Component Value Date   CHOL 123 01/14/2019   TRIG 90 01/14/2019   HDL 36 (L) 01/14/2019   CHOLHDL 3.4 01/14/2019   VLDL 16 03/10/2018   LDLCALC 70 01/14/2019   LDLCALC 84 03/10/2018   Lab Results  Component Value Date   TSH 0.87 01/14/2019   TSH 14.938 (H) 03/10/2018    Therapeutic Level Labs: No results found for: LITHIUM No results found for: VALPROATE No components found for:  CBMZ  Current Medications: Current Outpatient Medications  Medication Sig Dispense Refill  . Acetaminophen-Caffeine (EXCEDRIN ASPIRIN FREE PO) Take 2 tablets by mouth daily as needed.    . ALPRAZolam (XANAX) 1 MG tablet Take 1 tablet (1 mg total) by mouth 3 (three) times daily as needed for anxiety. 90 tablet 2  . Cyanocobalamin (VITAMIN B 12) 100 MCG LOZG Take by mouth.    Marland Kitchen FLUoxetine (PROZAC) 20 MG capsule  Take 1 capsule (20 mg total) by mouth 2 (two) times daily. 60 capsule 2  . HYDROcodone-acetaminophen (NORCO) 5-325 MG tablet Take 1 tablet by mouth 2 (two) times daily as needed for moderate pain. 60 tablet 0  . lactulose (CHRONULAC) 10 GM/15ML solution Take 30 mLs (20 g total) by mouth 2 (two) times daily as needed for mild constipation. 1892 mL 3  . Multiple Vitamins-Minerals (CENTRUM PO) Take 1 tablet by mouth every evening.    . nitrofurantoin, macrocrystal-monohydrate, (MACROBID) 100 MG capsule Take 1 capsule (100 mg total) by mouth 2 (two) times daily. 14 capsule 0  . omeprazole (PRILOSEC) 20 MG capsule TAKE 1 CAPSULE BY MOUTH DAILY. FOR ACID REFLUX 90 capsule 1  . potassium chloride SA (KLOR-CON) 20 MEQ tablet Take 1 tablet (20 mEq total) by mouth daily. FOR 3 DAYS 3 tablet 0  . traZODone (DESYREL) 100 MG tablet Take 2 tablets (200 mg total) by mouth at bedtime as needed for sleep. 180 tablet 1   No current facility-administered medications for this visit.     Musculoskeletal: Strength & Muscle Tone: within normal limits Gait & Station: normal Patient leans: N/A  Psychiatric Specialty Exam: Review of Systems  Gastrointestinal: Positive for abdominal pain.  All other systems reviewed and are negative.   There were no vitals taken for this visit.There is no height or weight on file to calculate BMI.  General Appearance: NA  Eye Contact:  NA  Speech:  Clear and Coherent  Volume:  Normal  Mood:  Anxious and Euthymic  Affect:  NA  Thought Process:  Goal Directed  Orientation:  Full (Time, Place, and Person)  Thought Content: Rumination   Suicidal Thoughts:  No  Homicidal Thoughts:  No  Memory:  Immediate;   Good Recent;   Good Remote;   Fair  Judgement:  Good  Insight:  Fair  Psychomotor Activity:  Normal  Concentration:  Concentration: Good and Attention Span: Good  Recall:  Good  Fund of Knowledge: Good  Language: Good  Akathisia:  No  Handed:  Right  AIMS (if  indicated): not done  Assets:  Communication Skills Desire for Improvement Resilience Social Support  ADL's:  Intact  Cognition: WNL  Sleep:  Good   Screenings: AIMS     Admission (Discharged) from OP Visit from 03/10/2018 in [place] [facility] Admission (Discharged) from 12/03/2016 in [place] [facility]  AIMS Total Score 0 0    AUDIT     Admission (Discharged) from OP Visit from 03/10/2018 in [place] [facility] Admission (Discharged) from 12/03/2016 in [place] [facility]  Alcohol Use Disorder Identification Test Final Score (AUDIT) 0 0    MDI     Office Visit from 11/15/2015 in [facility] [facility]  Total Score (max 50) 24    PHQ2-9     Office Visit from 09/02/2019 in [place] Office Visit from 01/14/2019 in [place] Office Visit from 07/07/2018 in [place] Office Visit from 10/27/2017 in [place] Office Visit from 01/19/2017 in [place]  PHQ-2 Total Score 2 2 0 0 4  PHQ-9 Total Score 3 8 -- -- 17       Assessment and Plan: This patient is a 59 year old female with a history of PTSD, depression and anxiety.  She seems to be doing better on her current regimen.  She will continue Prozac 40 mg daily for depression, trazodone 200 mg at bedtime for sleep and Xanax 1 mg 3 times daily as needed for anxiety.  She will return to see me in 6 weeks   Levonne Spiller, MD 01/24/2020, 11:05 AM

## 2020-01-25 ENCOUNTER — Encounter: Payer: Medicare Other | Admitting: Family Medicine

## 2020-01-30 ENCOUNTER — Other Ambulatory Visit: Payer: Self-pay | Admitting: *Deleted

## 2020-01-30 MED ORDER — HYDROCODONE-ACETAMINOPHEN 5-325 MG PO TABS: 1.0000 | ORAL_TABLET | Freq: Two times a day (BID) | ORAL | 0 refills | Status: DC | PRN

## 2020-01-30 NOTE — Telephone Encounter (Signed)
Received call from patient.   Requested refill on Hydrocodone/APAP.   Ok to refill??  Last office visit 09/02/2019.  Last refill 12/28/2019.

## 2020-02-27 ENCOUNTER — Other Ambulatory Visit: Payer: Self-pay | Admitting: *Deleted

## 2020-02-27 NOTE — Telephone Encounter (Signed)
Received call from patient.   Requested refill on Hydrocodone/APAP.   Ok to refill??  Last office visit 09/02/2019.  Last refill 01/30/2020.

## 2020-02-28 ENCOUNTER — Other Ambulatory Visit: Payer: Self-pay | Admitting: Family Medicine

## 2020-02-28 MED ORDER — HYDROCODONE-ACETAMINOPHEN 5-325 MG PO TABS: 1.0000 | ORAL_TABLET | Freq: Two times a day (BID) | ORAL | 0 refills | Status: DC | PRN

## 2020-02-29 ENCOUNTER — Telehealth (HOSPITAL_COMMUNITY): Payer: Medicare Other | Admitting: Psychiatry

## 2020-02-29 ENCOUNTER — Other Ambulatory Visit: Payer: Self-pay

## 2020-03-01 ENCOUNTER — Encounter (HOSPITAL_COMMUNITY): Payer: Self-pay | Admitting: Psychiatry

## 2020-03-01 ENCOUNTER — Telehealth (INDEPENDENT_AMBULATORY_CARE_PROVIDER_SITE_OTHER): Payer: Medicare Other | Admitting: Psychiatry

## 2020-03-01 ENCOUNTER — Other Ambulatory Visit: Payer: Self-pay

## 2020-03-01 DIAGNOSIS — F332 Major depressive disorder, recurrent severe without psychotic features: Secondary | ICD-10-CM | POA: Diagnosis not present

## 2020-03-01 DIAGNOSIS — F4310 Post-traumatic stress disorder, unspecified: Secondary | ICD-10-CM | POA: Diagnosis not present

## 2020-03-01 MED ORDER — ALPRAZOLAM 1 MG PO TABS: 1.0000 mg | ORAL_TABLET | Freq: Three times a day (TID) | ORAL | 2 refills | Status: DC | PRN

## 2020-03-01 MED ORDER — FLUOXETINE HCL 20 MG PO CAPS: 20.0000 mg | ORAL_CAPSULE | Freq: Two times a day (BID) | ORAL | 2 refills | Status: DC

## 2020-03-01 MED ORDER — TRAZODONE HCL 100 MG PO TABS: 200.0000 mg | ORAL_TABLET | Freq: Every evening | ORAL | 1 refills | Status: DC | PRN

## 2020-03-01 NOTE — Progress Notes (Signed)
[facility] MD/PA/NP OP Progress Note  03/01/2020 3:40 PM Kathleen Palmer  MRN:  [id number]  Chief Complaint:  Chief Complaint    Depression; Anxiety; Follow-up     ZTI:[id number] patient is a 59 year old female who lives with her husband in [place].  She is on disability.  She has a history of depression anxiety posttraumatic stress disorder as well as borderline traits.  The patient returns for follow-up after 4 weeks.  She states that she is doing somewhat better.  She still has crying spells and misses her sister but they seem to be subsiding.  She is sleeping well.  She is trying to stay fairly active.  She denies serious depression or suicidal ideation and her anxiety is under fairly good control with the Xanax. Visit Diagnosis: Depression, anxiety  Past Psychiatric History: Several previous hospitalizations for depression and suicidal ideation, the last 1 being about 2 years ago  Past Medical History:  Past Medical History:  Diagnosis Date  . Angiomyolipoma    Left kidney  . Anxiety   . Arthritis   . Attention to urostomy [facility])   . Chronic pain   . Depression   . Hepatitis C    ? Contracted through IVDA  . Panic 04/29/1999  . PTSD (post-traumatic stress disorder)   . Substance abuse ([place])    Remote history- cocaine, ETOH, Marijuana  . Thyroid disease     Past Surgical History:  Procedure Laterality Date  . ABDOMINAL HYSTERECTOMY    . ABDOMINAL SURGERY    . BIOPSY  01/27/2017   Procedure: BIOPSY;  Surgeon: Danie Binder, MD;  [facility];  Service: Endoscopy;;  gastric  . CHOLECYSTECTOMY    . COLONOSCOPY WITH PROPOFOL N/A 01/27/2017   Procedure: COLONOSCOPY WITH PROPOFOL;  Surgeon: Danie Binder, MD;  [facility];  Service: Endoscopy;  Laterality: N/A;  12:00pm  . ESOPHAGOGASTRODUODENOSCOPY (EGD) WITH PROPOFOL N/A 01/27/2017   Procedure: ESOPHAGOGASTRODUODENOSCOPY (EGD) WITH PROPOFOL;  Surgeon: Danie Binder, MD;  [facility];  Service:  Endoscopy;  Laterality: N/A;  . HERNIA REPAIR     x2- abdomen  . ILEO LOOP CONDUIT    . 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 VAGINA RECONSTRUCTION SURGERY      Family Psychiatric History: see below  Family History:  Family History  Adopted: Yes  Problem Relation Age of Onset  . Depression Mother   . Depression Sister   . Anxiety disorder Sister   . Bipolar disorder Sister   . Depression Maternal Grandmother   . ADD / ADHD Neg Hx   . Alcohol abuse Neg Hx   . Drug abuse Neg Hx   . Dementia Neg Hx   . OCD Neg Hx   . Paranoid behavior Neg Hx   . Schizophrenia Neg Hx   . Seizures Neg Hx   . Sexual abuse Neg Hx   . Physical abuse Neg Hx   . Suicidality Neg Hx   . Colon cancer Neg Hx     Social History:  Social History   Socioeconomic History  . Marital status: Married    Spouse name: Not on file  . Number of children: 0  . Years of education: Not on file  . Highest education level: Not on file  Occupational History  . Occupation: disability  Tobacco Use  . Smoking status:  Former Smoker    Packs/day: 0.25    Years: 33.00    Pack years: 8.25    Types: Cigarettes    Quit date: 01/21/2015    Years since quitting: 5.1  . Smokeless tobacco: Never Used  . Tobacco comment: 9-10 cigs a day as of 10/20/2012, (02-07-15 per pt, she stopped smoking 01-21-15)  Vaping Use  . Vaping Use: Never used  Substance and Sexual Activity  . Alcohol use: No    Alcohol/week: 0.0 standard drinks  . Drug use: Not Currently    Types: Marijuana    Comment: quit 10 days   . Sexual activity: Yes    Birth control/protection: Surgical  Other Topics Concern  . Not on file  Social History Narrative  . Not on file   Social Determinants of Health   Financial Resource Strain:   . Difficulty of Paying Living Expenses: Not on file  Food Insecurity:   . Worried About Paediatric nurse in the Last Year: Not on file  . Ran Out of Food in the Last Year: Not on file  Transportation Needs:   . Lack of Transportation (Medical): Not on file  . Lack of Transportation (Non-Medical): Not on file  Physical Activity:   . Days of Exercise per Week: Not on file  . Minutes of Exercise per Session: Not on file  Stress:   . Feeling of Stress : Not on file  Social Connections:   . Frequency of Communication with Friends and Family: Not on file  . Frequency of Social Gatherings with Friends and Family: Not on file  . Attends Religious Services: Not on file  . Active Member of Clubs or Organizations: Not on file  . Attends Archivist Meetings: Not on file  . Marital Status: Not on file    Allergies:  Allergies  Allergen Reactions  . Oxycodone Anxiety  . Abilify [Aripiprazole]     Stiff neck  . Bactrim [Sulfamethoxazole-Trimethoprim]     Tongue swelled  . Gabapentin     Dizziness to the point she actually fell  . Ampicillin Rash    Has patient had a PCN reaction causing immediate rash, facial/tongue/throat swelling, SOB or lightheadedness with hypotension: Unknown Has patient had a PCN reaction causing severe rash involving mucus membranes or skin necrosis: Unknown Has patient had a PCN reaction that required hospitalization: No Has patient had a PCN reaction occurring within the last 10 years: No If all of the above answers are "NO", then may proceed with Cephalosporin use.      Metabolic Disorder Labs: Lab Results  Component Value Date   HGBA1C 5.2 03/10/2018   MPG 102.54 03/10/2018   MPG 102.54 12/05/2016   No results found for: PROLACTIN Lab Results  Component Value Date   CHOL 123 01/14/2019   TRIG 90 01/14/2019   HDL 36 (L) 01/14/2019   CHOLHDL 3.4 01/14/2019   VLDL 16 03/10/2018   LDLCALC 70 01/14/2019   LDLCALC 84 03/10/2018   Lab Results  Component Value Date   TSH 0.87 01/14/2019   TSH 14.938 (H) 03/10/2018    Therapeutic  Level Labs: No results found for: LITHIUM No results found for: VALPROATE No components found for:  CBMZ  Current Medications: Current Outpatient Medications  Medication Sig Dispense Refill  . Acetaminophen-Caffeine (EXCEDRIN ASPIRIN FREE PO) Take 2 tablets by mouth daily as needed.    . ALPRAZolam (XANAX) 1 MG tablet Take 1 tablet (1 mg total) by mouth 3 (  three) times daily as needed for anxiety. 90 tablet 2  . Cyanocobalamin (VITAMIN B 12) 100 MCG LOZG Take by mouth.    Marland Kitchen FLUoxetine (PROZAC) 20 MG capsule Take 1 capsule (20 mg total) by mouth 2 (two) times daily. 60 capsule 2  . HYDROcodone-acetaminophen (NORCO) 5-325 MG tablet Take 1 tablet by mouth 2 (two) times daily as needed for moderate pain. 60 tablet 0  . lactulose (CHRONULAC) 10 GM/15ML solution Take 30 mLs (20 g total) by mouth 2 (two) times daily as needed for mild constipation. 1892 mL 3  . Multiple Vitamins-Minerals (CENTRUM PO) Take 1 tablet by mouth every evening.    . nitrofurantoin, macrocrystal-monohydrate, (MACROBID) 100 MG capsule Take 1 capsule (100 mg total) by mouth 2 (two) times daily. 14 capsule 0  . omeprazole (PRILOSEC) 20 MG capsule TAKE 1 CAPSULE BY MOUTH DAILY. FOR ACID REFLUX 90 capsule 1  . potassium chloride SA (KLOR-CON) 20 MEQ tablet Take 1 tablet (20 mEq total) by mouth daily. FOR 3 DAYS 3 tablet 0  . traZODone (DESYREL) 100 MG tablet Take 2 tablets (200 mg total) by mouth at bedtime as needed for sleep. 180 tablet 1   No current facility-administered medications for this visit.     Musculoskeletal: Strength & Muscle Tone: within normal limits Gait & Station: normal Patient leans: N/A  Psychiatric Specialty Exam: Review of Systems  Gastrointestinal: Positive for abdominal pain.  All other systems reviewed and are negative.   There were no vitals taken for this visit.There is no height or weight on file to calculate BMI.  General Appearance: NA  Eye Contact:  NA  Speech:  Clear and Coherent   Volume:  Normal  Mood:  Anxious and Euthymic  Affect:  NA  Thought Process:  Goal Directed  Orientation:  Full (Time, Place, and Person)  Thought Content: Rumination   Suicidal Thoughts:  No  Homicidal Thoughts:  No  Memory:  Immediate;   Good Recent;   Good Remote;   Good  Judgement:  Good  Insight:  Good  Psychomotor Activity:  Normal  Concentration:  Concentration: Good and Attention Span: Good  Recall:  Good  Fund of Knowledge: Good  Language: Good  Akathisia:  No  Handed:  Right  AIMS (if indicated): not done  Assets:  Communication Skills Desire for Improvement Resilience Social Support Talents/Skills  ADL's:  Intact  Cognition: WNL  Sleep:  Good   Screenings: AIMS     Admission (Discharged) from OP Visit from 03/10/2018 in [place] [facility] Admission (Discharged) from 12/03/2016 in [place] [facility]  AIMS Total Score 0 0    AUDIT     Admission (Discharged) from OP Visit from 03/10/2018 in [place] [facility] Admission (Discharged) from 12/03/2016 in [place] [facility]  Alcohol Use Disorder Identification Test Final Score (AUDIT) 0 0    MDI     Office Visit from 11/15/2015 in [facility]  Total Score (max 50) 24    PHQ2-9     Office Visit from 09/02/2019 in [place] Office Visit from 01/14/2019 in [place] Office Visit from 07/07/2018 in [place] Office Visit from 10/27/2017 in [place] Office Visit from 01/19/2017 in [place]  PHQ-2 Total Score 2 2 0 0 4  PHQ-9 Total Score 3 8 -- -- 17  Assessment and Plan: This patient is a 59 year old female with a history of PTSD depression and anxiety.  She seems to be stable on her current regimen.  She will continue Prozac 40 mg daily for depression, trazodone 200 mg at bedtime  for sleep and Xanax 1 mg 3 times daily as needed for anxiety.  She will return to see me in 6 weeks   Levonne Spiller, MD 03/01/2020, 3:40 PM

## 2020-03-28 ENCOUNTER — Other Ambulatory Visit: Payer: Self-pay | Admitting: *Deleted

## 2020-03-28 MED ORDER — HYDROCODONE-ACETAMINOPHEN 5-325 MG PO TABS: 1.0000 | ORAL_TABLET | Freq: Two times a day (BID) | ORAL | 0 refills | Status: DC | PRN

## 2020-03-28 NOTE — Telephone Encounter (Signed)
Received call from patient.   Requested refill on Hydrocodone/APAP.   Ok to refill??  Last office visit 09/02/2019.  Last refill 02/28/2020.

## 2020-04-03 ENCOUNTER — Other Ambulatory Visit: Payer: Self-pay

## 2020-04-03 ENCOUNTER — Emergency Department (HOSPITAL_COMMUNITY)
Admission: EM | Admit: 2020-04-03 | Discharge: 2020-04-03 | Discharge status: Against Medical Advice | Payer: Medicare Other

## 2020-04-30 ENCOUNTER — Telehealth (HOSPITAL_COMMUNITY): Payer: Medicare Other | Admitting: Psychiatry

## 2020-04-30 ENCOUNTER — Other Ambulatory Visit: Payer: Self-pay | Admitting: *Deleted

## 2020-04-30 NOTE — Telephone Encounter (Signed)
Received call from patient.   Requested refill on Hydrocodone/APAP.   Ok to refill??  Last office visit 09/02/2019.  Last refill 03/28/2020.

## 2020-05-01 MED ORDER — HYDROCODONE-ACETAMINOPHEN 5-325 MG PO TABS: 1.0000 | ORAL_TABLET | Freq: Two times a day (BID) | ORAL | 0 refills | Status: DC | PRN

## 2020-05-02 ENCOUNTER — Encounter: Payer: Medicare Other | Admitting: Family Medicine

## 2020-05-08 ENCOUNTER — Other Ambulatory Visit: Payer: Self-pay

## 2020-05-08 ENCOUNTER — Telehealth (INDEPENDENT_AMBULATORY_CARE_PROVIDER_SITE_OTHER): Payer: Medicare Other | Admitting: Psychiatry

## 2020-05-08 ENCOUNTER — Encounter (HOSPITAL_COMMUNITY): Payer: Self-pay | Admitting: Psychiatry

## 2020-05-08 DIAGNOSIS — F332 Major depressive disorder, recurrent severe without psychotic features: Secondary | ICD-10-CM | POA: Diagnosis not present

## 2020-05-08 DIAGNOSIS — F4310 Post-traumatic stress disorder, unspecified: Secondary | ICD-10-CM | POA: Diagnosis not present

## 2020-05-08 MED ORDER — TRAZODONE HCL 100 MG PO TABS: 200.0000 mg | ORAL_TABLET | Freq: Every evening | ORAL | 1 refills | Status: DC | PRN

## 2020-05-08 MED ORDER — FLUOXETINE HCL 20 MG PO CAPS
20.0000 mg | ORAL_CAPSULE | Freq: Two times a day (BID) | ORAL | 2 refills | Status: DC
Start: 2020-05-08 — End: 2020-07-03

## 2020-05-08 MED ORDER — ALPRAZOLAM 1 MG PO TABS: 1.0000 mg | ORAL_TABLET | Freq: Three times a day (TID) | ORAL | 2 refills | Status: DC | PRN

## 2020-05-08 NOTE — Progress Notes (Signed)
Virtual Visit via Telephone Note  I connected with Kathleen Palmer on 05/08/20 at 10:40 AM EST by telephone and verified that I am speaking with the correct person using two identifiers.  Location: Patient: home Provider: office   I discussed the limitations, risks, security and privacy concerns of performing an evaluation and management service by telephone and the availability of in person appointments. I also discussed with the patient that there may be a patient responsible charge related to this service. The patient expressed understanding and agreed to proceed.    I discussed the assessment and treatment plan with the patient. The patient was provided an opportunity to ask questions and all were answered. The patient agreed with the plan and demonstrated an understanding of the instructions.   The patient was advised to call back or seek an in-person evaluation if the symptoms worsen or if the condition fails to improve as anticipated.  I provided 15 minutes of non-face-to-face time during this encounter.   Levonne Spiller, MD  [facility] MD/PA/NP OP Progress Note  05/08/2020 10:50 AM Kathleen Palmer  MRN:  [id number]  Chief Complaint:  Chief Complaint    Anxiety; Depression; Follow-up     HPI: This patient is a 60 year old female who lives with her husband in [place]. She is on disability. She has a history of depression anxiety posttraumatic stress disorder as well as borderline traits.  The patient returns for follow-up after 2 months.  She states that she is generally doing okay.  She has her ups and downs but no severe depression or thoughts of suicide or self-harm.  She is sleeping well most of the time with 200 mg of trazodone.  Her anxiety is under good control with the Xanax Visit Diagnosis:    ICD-10-CM   1. Major depressive disorder, recurrent episode, severe with anxious distress ([place])  F33.2   2. PTSD (post-traumatic stress disorder)  F43.10     Past Psychiatric  History: Several previous hospitalizations for depression and suicidal ideation, the last one being about 2 years ago.  Past Medical History:  Past Medical History:  Diagnosis Date  . Angiomyolipoma    Left kidney  . Anxiety   . Arthritis   . Attention to urostomy [facility])   . Chronic pain   . Depression   . Hepatitis C    ? Contracted through IVDA  . Panic 04/29/1999  . PTSD (post-traumatic stress disorder)   . Substance abuse ([place])    Remote history- cocaine, ETOH, Marijuana  . Thyroid disease     Past Surgical History:  Procedure Laterality Date  . ABDOMINAL HYSTERECTOMY    . ABDOMINAL SURGERY    . BIOPSY  01/27/2017   Procedure: BIOPSY;  Surgeon: Danie Binder, MD;  [facility];  Service: Endoscopy;;  gastric  . CHOLECYSTECTOMY    . COLONOSCOPY WITH PROPOFOL N/A 01/27/2017   Procedure: COLONOSCOPY WITH PROPOFOL;  Surgeon: Danie Binder, MD;  [facility];  Service: Endoscopy;  Laterality: N/A;  12:00pm  . ESOPHAGOGASTRODUODENOSCOPY (EGD) WITH PROPOFOL N/A 01/27/2017   Procedure: ESOPHAGOGASTRODUODENOSCOPY (EGD) WITH PROPOFOL;  Surgeon: Danie Binder, MD;  [facility];  Service: Endoscopy;  Laterality: N/A;  . HERNIA REPAIR     x2- abdomen  . ILEO LOOP CONDUIT    . 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 VAGINA  RECONSTRUCTION SURGERY      Family Psychiatric History: see below  Family History:  Family History  Adopted: Yes  Problem Relation Age of Onset  . Depression Mother   . Depression Sister   . Anxiety disorder Sister   . Bipolar disorder Sister   . Depression Maternal Grandmother   . ADD / ADHD Neg Hx   . Alcohol abuse Neg Hx   . Drug abuse Neg Hx   . Dementia Neg Hx   . OCD Neg Hx   . Paranoid behavior Neg Hx   . Schizophrenia Neg Hx   . Seizures Neg Hx   . Sexual abuse Neg Hx   . Physical abuse Neg Hx    . Suicidality Neg Hx   . Colon cancer Neg Hx     Social History:  Social History   Socioeconomic History  . Marital status: Married    Spouse name: Not on file  . Number of children: 0  . Years of education: Not on file  . Highest education level: Not on file  Occupational History  . Occupation: disability  Tobacco Use  . Smoking status: Former Smoker    Packs/day: 0.25    Years: 33.00    Pack years: 8.25    Types: Cigarettes    Quit date: 01/21/2015    Years since quitting: 5.2  . Smokeless tobacco: Never Used  . Tobacco comment: 9-10 cigs a day as of 10/20/2012, (02-07-15 per pt, she stopped smoking 01-21-15)  Vaping Use  . Vaping Use: Never used  Substance and Sexual Activity  . Alcohol use: No    Alcohol/week: 0.0 standard drinks  . Drug use: Not Currently    Types: Marijuana    Comment: quit 10 days   . Sexual activity: Yes    Birth control/protection: Surgical  Other Topics Concern  . Not on file  Social History Narrative  . Not on file   Social Determinants of Health   Financial Resource Strain: Not on file  Food Insecurity: Not on file  Transportation Needs: Not on file  Physical Activity: Not on file  Stress: Not on file  Social Connections: Not on file    Allergies:  Allergies  Allergen Reactions  . Oxycodone Anxiety  . Abilify [Aripiprazole]     Stiff neck  . Bactrim [Sulfamethoxazole-Trimethoprim]     Tongue swelled  . Gabapentin     Dizziness to the point she actually fell  . Ampicillin Rash    Has patient had a PCN reaction causing immediate rash, facial/tongue/throat swelling, SOB or lightheadedness with hypotension: Unknown Has patient had a PCN reaction causing severe rash involving mucus membranes or skin necrosis: Unknown Has patient had a PCN reaction that required hospitalization: No Has patient had a PCN reaction occurring within the last 10 years: No If all of the above answers are "NO", then may proceed with Cephalosporin  use.      Metabolic Disorder Labs: Lab Results  Component Value Date   HGBA1C 5.2 03/10/2018   MPG 102.54 03/10/2018   MPG 102.54 12/05/2016   No results found for: PROLACTIN Lab Results  Component Value Date   CHOL 123 01/14/2019   TRIG 90 01/14/2019   HDL 36 (L) 01/14/2019   CHOLHDL 3.4 01/14/2019   VLDL 16 03/10/2018   LDLCALC 70 01/14/2019   LDLCALC 84 03/10/2018   Lab Results  Component Value Date   TSH 0.87 01/14/2019   TSH 14.938 (H) 03/10/2018    Therapeutic  Level Labs: No results found for: LITHIUM No results found for: VALPROATE No components found for:  CBMZ  Current Medications: Current Outpatient Medications  Medication Sig Dispense Refill  . Acetaminophen-Caffeine (EXCEDRIN ASPIRIN FREE PO) Take 2 tablets by mouth daily as needed.    . ALPRAZolam (XANAX) 1 MG tablet Take 1 tablet (1 mg total) by mouth 3 (three) times daily as needed for anxiety. 90 tablet 2  . Cyanocobalamin (VITAMIN B 12) 100 MCG LOZG Take by mouth.    Marland Kitchen FLUoxetine (PROZAC) 20 MG capsule Take 1 capsule (20 mg total) by mouth 2 (two) times daily. 60 capsule 2  . HYDROcodone-acetaminophen (NORCO) 5-325 MG tablet Take 1 tablet by mouth 2 (two) times daily as needed for moderate pain. 60 tablet 0  . lactulose (CHRONULAC) 10 GM/15ML solution Take 30 mLs (20 g total) by mouth 2 (two) times daily as needed for mild constipation. 1892 mL 3  . Multiple Vitamins-Minerals (CENTRUM PO) Take 1 tablet by mouth every evening.    . nitrofurantoin, macrocrystal-monohydrate, (MACROBID) 100 MG capsule Take 1 capsule (100 mg total) by mouth 2 (two) times daily. 14 capsule 0  . omeprazole (PRILOSEC) 20 MG capsule TAKE 1 CAPSULE BY MOUTH DAILY. FOR ACID REFLUX 90 capsule 1  . potassium chloride SA (KLOR-CON) 20 MEQ tablet Take 1 tablet (20 mEq total) by mouth daily. FOR 3 DAYS 3 tablet 0  . traZODone (DESYREL) 100 MG tablet Take 2 tablets (200 mg total) by mouth at bedtime as needed for sleep. 180 tablet 1    No current facility-administered medications for this visit.     Musculoskeletal: Strength & Muscle Tone: within normal limits Gait & Station: normal Patient leans: N/A  Psychiatric Specialty Exam: Review of Systems  There were no vitals taken for this visit.There is no height or weight on file to calculate BMI.  General Appearance: NA  Eye Contact:  NA  Speech:  Clear and Coherent  Volume:  Normal  Mood:  Euthymic  Affect:  NA  Thought Process:  Goal Directed  Orientation:  Full (Time, Place, and Person)  Thought Content: WDL   Suicidal Thoughts:  No  Homicidal Thoughts:  No  Memory:  Immediate;   Good Recent;   Good Remote;   Good  Judgement:  Fair  Insight:  Fair  Psychomotor Activity:  Decreased  Concentration:  Concentration: Good and Attention Span: Good  Recall:  Good  Fund of Knowledge: Good  Language: Good  Akathisia:  No  Handed:  Right  AIMS (if indicated): not done  Assets:  Communication Skills Desire for Improvement Resilience Social Support Talents/Skills  ADL's:  Intact  Cognition: WNL  Sleep:  Good   Screenings: AIMS   Flowsheet Row Admission (Discharged) from OP Visit from 03/10/2018 in [place] [facility] Admission (Discharged) from 12/03/2016 in [place] [facility]  AIMS Total Score 0 0    AUDIT   Flowsheet Row Admission (Discharged) from OP Visit from 03/10/2018 in [place] [facility] Admission (Discharged) from 12/03/2016 in [place] [facility]B  Alcohol Use Disorder Identification Test Final Score (AUDIT) 0 0    MDI   Flowsheet Row Office Visit from 11/15/2015 in [place] [facility]  Total Score (max 50) 24    PHQ2-9   [place] Office Visit from 09/02/2019 in [place] Office Visit from 01/14/2019 in [place] Office Visit from 07/07/2018 in [facility] Visit from 10/27/2017 in [place] Office Visit from 01/19/2017 in [place]  PHQ-2 Total Score 2 2 0 0 4  PHQ-9 Total Score 3 8 - - 17       Assessment and Plan: This patient is a 60 year old female with a history of PTSD depression anxiety.  She is stable on her current regimen.  She will continue Prozac 40 mg daily for depression, trazodone 200 mg at bedtime for sleep and Xanax 1 mg 3 times daily as needed for anxiety.  She will return to see me in 2 months   Levonne Spiller, MD 05/08/2020, 10:51 AM

## 2020-05-09 ENCOUNTER — Encounter: Payer: Self-pay | Admitting: Family Medicine

## 2020-05-15 ENCOUNTER — Telehealth (HOSPITAL_COMMUNITY): Payer: Self-pay | Admitting: Psychiatry

## 2020-05-15 NOTE — Telephone Encounter (Signed)
Called to schedule f/u appt patient advised she would call back on Monday

## 2020-05-29 ENCOUNTER — Other Ambulatory Visit: Payer: Self-pay

## 2020-05-29 MED ORDER — HYDROCODONE-ACETAMINOPHEN 5-325 MG PO TABS: 1.0000 | ORAL_TABLET | Freq: Two times a day (BID) | ORAL | 0 refills | Status: DC | PRN

## 2020-06-06 ENCOUNTER — Ambulatory Visit: Payer: Medicare Other | Admitting: Gastroenterology

## 2020-06-12 ENCOUNTER — Ambulatory Visit: Payer: Medicare Other | Admitting: Family Medicine

## 2020-06-19 ENCOUNTER — Encounter (HOSPITAL_COMMUNITY): Payer: Self-pay | Admitting: Emergency Medicine

## 2020-06-19 ENCOUNTER — Telehealth: Payer: Self-pay | Admitting: Internal Medicine

## 2020-06-19 ENCOUNTER — Emergency Department (HOSPITAL_COMMUNITY)
Admission: EM | Admit: 2020-06-19 | Discharge: 2020-06-19 | Disposition: A | Discharge status: Home / Self Care | Payer: Medicare Other | Attending: Emergency Medicine | Admitting: Emergency Medicine

## 2020-06-19 ENCOUNTER — Ambulatory Visit: Payer: Medicare Other | Admitting: Nurse Practitioner

## 2020-06-19 ENCOUNTER — Emergency Department (HOSPITAL_COMMUNITY): Payer: Medicare Other

## 2020-06-19 ENCOUNTER — Other Ambulatory Visit: Payer: Self-pay

## 2020-06-19 DIAGNOSIS — R109 Unspecified abdominal pain: Secondary | ICD-10-CM | POA: Diagnosis not present

## 2020-06-19 DIAGNOSIS — E871 Hypo-osmolality and hyponatremia: Secondary | ICD-10-CM | POA: Diagnosis not present

## 2020-06-19 DIAGNOSIS — R198 Other specified symptoms and signs involving the digestive system and abdomen: Secondary | ICD-10-CM | POA: Insufficient documentation

## 2020-06-19 DIAGNOSIS — R748 Abnormal levels of other serum enzymes: Secondary | ICD-10-CM | POA: Diagnosis not present

## 2020-06-19 DIAGNOSIS — Z87891 Personal history of nicotine dependence: Secondary | ICD-10-CM | POA: Diagnosis not present

## 2020-06-19 DIAGNOSIS — E876 Hypokalemia: Secondary | ICD-10-CM | POA: Insufficient documentation

## 2020-06-19 DIAGNOSIS — G8929 Other chronic pain: Secondary | ICD-10-CM | POA: Insufficient documentation

## 2020-06-19 DIAGNOSIS — R1084 Generalized abdominal pain: Secondary | ICD-10-CM | POA: Diagnosis not present

## 2020-06-19 DIAGNOSIS — R110 Nausea: Secondary | ICD-10-CM | POA: Insufficient documentation

## 2020-06-19 DIAGNOSIS — E039 Hypothyroidism, unspecified: Secondary | ICD-10-CM | POA: Diagnosis not present

## 2020-06-19 DIAGNOSIS — R7989 Other specified abnormal findings of blood chemistry: Secondary | ICD-10-CM

## 2020-06-19 DIAGNOSIS — R945 Abnormal results of liver function studies: Secondary | ICD-10-CM | POA: Insufficient documentation

## 2020-06-19 LAB — URINALYSIS, ROUTINE W REFLEX MICROSCOPIC
Bilirubin Urine: NEGATIVE
Glucose, UA: NEGATIVE mg/dL
Ketones, ur: NEGATIVE mg/dL
Nitrite: NEGATIVE
Protein, ur: 100 mg/dL — AB
RBC / HPF: >50 RBC/hpf — ABNORMAL HIGH (ref 0–5)
Specific Gravity, Urine: 1.013 (ref 1.005–1.030)
WBC, UA: >50 WBC/hpf — ABNORMAL HIGH (ref 0–5)
pH: 6 (ref 5.0–8.0)

## 2020-06-19 LAB — COMPREHENSIVE METABOLIC PANEL
ALT: 68 U/L — ABNORMAL HIGH (ref 0–44)
AST: 66 U/L — ABNORMAL HIGH (ref 15–41)
Albumin: 3.4 g/dL — ABNORMAL LOW (ref 3.5–5.0)
Alkaline Phosphatase: 68 U/L (ref 38–126)
Anion gap: 6 (ref 5–15)
BUN: 9 mg/dL (ref 8–23)
CO2: 24 mmol/L (ref 22–32)
Calcium: 8.6 mg/dL — ABNORMAL LOW (ref 8.9–10.3)
Chloride: 102 mmol/L (ref 98–111)
Creatinine, Ser: 0.69 mg/dL (ref 0.44–1.00)
GFR, Estimated: >60 mL/min (ref >60)
Glucose, Bld: 137 mg/dL — ABNORMAL HIGH (ref 70–99)
Potassium: 3.1 mmol/L — ABNORMAL LOW (ref 3.5–5.1)
Sodium: 132 mmol/L — ABNORMAL LOW (ref 135–145)
Total Bilirubin: 2.1 mg/dL — ABNORMAL HIGH (ref 0.3–1.2)
Total Protein: 6.9 g/dL (ref 6.5–8.1)

## 2020-06-19 LAB — CBC
HCT: 40.3 % (ref 36.0–46.0)
Hemoglobin: 13.7 g/dL (ref 12.0–15.0)
MCH: 30.9 pg (ref 26.0–34.0)
MCHC: 34 g/dL (ref 30.0–36.0)
MCV: 91 fL (ref 80.0–100.0)
Platelets: 44 10*3/uL — ABNORMAL LOW (ref 150–400)
RBC: 4.43 MIL/uL (ref 3.87–5.11)
RDW: 13.5 % (ref 11.5–15.5)
WBC: 7.2 10*3/uL (ref 4.0–10.5)
nRBC: 0 % (ref 0.0–0.2)

## 2020-06-19 LAB — LIPASE, BLOOD: Lipase: 48 U/L (ref 11–51)

## 2020-06-19 MED ORDER — SODIUM CHLORIDE 0.9 % IV BOLUS
1000.0000 mL | Freq: Once | INTRAVENOUS | Status: AC
  Administered 2020-06-19: 1000 mL via INTRAVENOUS

## 2020-06-19 MED ORDER — HYDROMORPHONE HCL 1 MG/ML IJ SOLN
1.0000 mg | Freq: Once | INTRAMUSCULAR | Status: AC
  Administered 2020-06-19: 1 mg via INTRAVENOUS
  Filled 2020-06-19: qty 1

## 2020-06-19 MED ORDER — POLYETHYLENE GLYCOL 3350 17 GM/SCOOP PO POWD: ORAL | 0 refills | Status: DC

## 2020-06-19 MED ORDER — ONDANSETRON HCL 4 MG/2ML IJ SOLN
4.0000 mg | Freq: Once | INTRAMUSCULAR | Status: AC
  Administered 2020-06-19: 4 mg via INTRAVENOUS
  Filled 2020-06-19: qty 2

## 2020-06-19 MED ORDER — IOHEXOL 300 MG/ML  SOLN
100.0000 mL | Freq: Once | INTRAMUSCULAR | Status: AC | PRN
  Administered 2020-06-19: 100 mL via INTRAVENOUS

## 2020-06-19 MED ORDER — MORPHINE SULFATE (PF) 4 MG/ML IV SOLN
4.0000 mg | Freq: Once | INTRAVENOUS | Status: AC
  Administered 2020-06-19: 4 mg via INTRAVENOUS
  Filled 2020-06-19: qty 1

## 2020-06-19 NOTE — Telephone Encounter (Signed)
If this patient has canceled 21 times since 2018, I think it would be in our practice's best interest to formally discharge her. Thank you

## 2020-06-19 NOTE — ED Provider Notes (Signed)
[facility] EMERGENCY DEPARTMENT Provider Note   CSN: [id number] Arrival date & time: 06/19/20  5643   History Chief Complaint  Patient presents with  . Abdominal Pain    Kathleen Palmer is a 60 y.o. female.  The history is provided by the patient.  Abdominal Pain She has history of depression, ileal conduit and comes in because of abdominal pain for the last week.  Pain is left-sided and severe and she rates it a 10/10.  She has not had a bowel movement during this time and is passed only a small amount of flatus.  There has been associated nausea and dry heaves but no vomiting.  She denies fever, chills, sweats.  She has taken hydrocodone for pain without relief.  Past Medical History:  Diagnosis Date  . Angiomyolipoma    Left kidney  . Anxiety   . Arthritis   . Attention to urostomy [facility])   . Chronic pain   . Depression   . Hepatitis C    ? Contracted through IVDA  . Panic 04/29/1999  . PTSD (post-traumatic stress disorder)   . Substance abuse ([place])    Remote history- cocaine, ETOH, Marijuana  . Thyroid disease     Patient Active Problem List   Diagnosis Date Noted  . Aortic atherosclerosis ([place]) 07/07/2018  . Thrombocytopenia ([place]) 03/11/2018  . Ventral hernia 10/27/2017  . Gastritis due to nonsteroidal anti-inflammatory drug   . Constipation 12/30/2016  . Substance induced mood disorder ([place]) 12/04/2016  . 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 Family hx of ALS (amyotrophic lateral sclerosis) 08/31/2015  . Chronic hepatitis C ([place]) 01/24/2014  . Osteopenia 12/02/2013  . OA (osteoarthritis) of knee 09/20/2013  . Clitoral irritation 08/01/2013  . Epidermoid cyst of skin 08/01/2013  . Atypical chest pain 03/25/2013  . Encounter for screening colonoscopy 03/25/2013  . Knee pain 03/25/2013  . Marijuana use 07/20/2012  . Unspecified vitamin D deficiency 06/02/2012  . Pain 04/07/2012  . Elevated blood pressure (not  hypertension) 04/06/2012  . 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 Subclinical hypothyroidism 02/06/2012  . Chronic pain syndrome 02/06/2012  . H/O vaginal surgery 02/06/2012  . 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 Ileostomy status ([place]) 02/06/2012  . 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 PTSD (post-traumatic stress disorder) 02/06/2012  . MDD (major depressive disorder) ([place]) 02/06/2012    Past Surgical History:  Procedure Laterality Date  . ABDOMINAL HYSTERECTOMY    . ABDOMINAL SURGERY    . BIOPSY  01/27/2017   Procedure: BIOPSY;  Surgeon: Danie Binder, MD;  Location: [facility] ENDO SU[facility];  Service: Endoscopy;;  gastric  . CHOLECYSTECTOMY    . COLONOSCOPY WITH PROPOFOL N/A 01/27/2017   Procedure: COLONOSCOPY WITH PROPOFOL;  Surgeon: Danie Binder, MD;  Location: [facility] ENDO SUITE;  Service: Endoscopy;  Laterality: N/A;  12:00pm  . ESOPHAGOGASTRODUODENOSCOPY (EGD) WITH PROPOFOL N/A 01/27/2017   Procedure: ESOPHAGOGASTRODUODENOSCOPY (EGD) WITH PROPOFOL;  Surgeon: Danie Binder, MD;  Location: [facility] ENDO SUITE;  Service: Endoscopy;  Laterality: N/A;  . HERNIA REPAIR     x2- abdomen  . ILEO LOOP CONDUIT    . 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 VAGINA RECONSTRUCTION SURGERY       OB History   No obstetric history on file.     Family History  Adopted: Yes  Problem  Relation Age of Onset  . Depression Mother   . Depression Sister   . Anxiety disorder Sister   . Bipolar disorder Sister   . Depression Maternal Grandmother   . ADD / ADHD Neg Hx   . Alcohol abuse Neg Hx   . Drug abuse Neg Hx   . Dementia Neg Hx   . OCD Neg Hx   . Paranoid behavior Neg Hx   . Schizophrenia Neg Hx   . Seizures Neg Hx   . Sexual abuse Neg Hx   . Physical abuse Neg Hx   . Suicidality Neg Hx   . Colon cancer Neg Hx     Social History   Tobacco Use  . Smoking status: Former  Smoker    Packs/day: 0.25    Years: 33.00    Pack years: 8.25    Types: Cigarettes    Quit date: 01/21/2015    Years since quitting: 5.4  . Smokeless tobacco: Never Used  . Tobacco comment: 9-10 cigs a day as of 10/20/2012, (02-07-15 per pt, she stopped smoking 01-21-15)  Vaping Use  . Vaping Use: Never used  Substance Use Topics  . Alcohol use: No    Alcohol/week: 0.0 standard drinks  . Drug use: Not Currently    Types: Marijuana    Comment: quit 10 days     Home Medications Prior to Admission medications   Medication Sig Start Date End Date Taking? Authorizing Provider  Acetaminophen-Caffeine (EXCEDRIN ASPIRIN FREE PO) Take 2 tablets by mouth daily as needed.    [provider]  ALPRAZolam Duanne Moron) 1 MG tablet Take 1 tablet (1 mg total) by mouth 3 (three) times daily as needed for anxiety. 05/08/20 05/08/21  Cloria Spring, MD  Cyanocobalamin (VITAMIN B 12) 100 MCG LOZG Take by mouth.    [provider]  FLUoxetine (PROZAC) 20 MG capsule Take 1 capsule (20 mg total) by mouth 2 (two) times daily. 05/08/20   Cloria Spring, MD  HYDROcodone-acetaminophen (NORCO) 5-325 MG tablet Take 1 tablet by mouth 2 (two) times daily as needed for moderate pain. 05/29/20   [place], Modena Nunnery, MD  lactulose (CHRONULAC) 10 GM/15ML solution Take 30 mLs (20 g total) by mouth 2 (two) times daily as needed for mild constipation. 07/07/18   Alycia Rossetti, MD  Multiple Vitamins-Minerals (CENTRUM PO) Take 1 tablet by mouth every evening.    [provider]  nitrofurantoin, macrocrystal-monohydrate, (MACROBID) 100 MG capsule Take 1 capsule (100 mg total) by mouth 2 (two) times daily. 09/02/19   Alycia Rossetti, MD  omeprazole (PRILOSEC) 20 MG capsule TAKE 1 CAPSULE BY MOUTH DAILY. FOR ACID REFLUX 02/28/20   [place], Modena Nunnery, MD  potassium chloride SA (KLOR-CON) 20 MEQ tablet Take 1 tablet (20 mEq total) by mouth daily. FOR 3 DAYS 09/02/19   Alycia Rossetti, MD  traZODone (DESYREL) 100 MG  tablet Take 2 tablets (200 mg total) by mouth at bedtime as needed for sleep. 05/08/20   Cloria Spring, MD    Allergies    Oxycodone, Abilify [aripiprazole], Bactrim [sulfamethoxazole-trimethoprim], Gabapentin, and Ampicillin  Review of Systems   Review of Systems  Gastrointestinal: Positive for abdominal pain.  All other systems reviewed and are negative.   Physical Exam Updated Vital Signs BP 138/68   Pulse 81   Temp 98.3 F (36.8 C) (Oral)   Resp 20   Ht 5\' 3"  (1.6 m)   Wt 77 kg   SpO2 95%  BMI 30.07 kg/m   Physical Exam Vitals and nursing note reviewed.   60 year old female, resting comfortably and in no acute distress. Vital signs are normal. Oxygen saturation is 95%, which is normal. Head is normocephalic and atraumatic. PERRLA, EOMI. Oropharynx is clear. Neck is nontender and supple without adenopathy or JVD. Back is nontender and there is no CVA tenderness. Lungs are clear without rales, wheezes, or rhonchi. Chest is nontender. Heart has regular rate and rhythm without murmur. Abdomen is soft, flat, with marked tenderness in the left side of the abdomen.  There is no rebound or guarding.  There are no masses or hepatosplenomegaly and peristalsis is hypoactive.  Ileal conduit is present in the lower abdomen and is draining slightly cloudy urine. Extremities have trace edema, full range of motion is present. Skin is warm and dry without rash. Neurologic: Mental status is normal, cranial nerves are intact, there are no motor or sensory deficits.  ED Results / Procedures / Treatments   Labs (all labs ordered are listed, but only abnormal results are displayed) Labs Reviewed  COMPREHENSIVE METABOLIC PANEL - Abnormal; Notable for the following components:      Result Value   Sodium 132 (*)    Potassium 3.1 (*)    Glucose, Bld 137 (*)    Calcium 8.6 (*)    Albumin 3.4 (*)    AST 66 (*)    ALT 68 (*)    Total Bilirubin 2.1 (*)    All other components within  normal limits  CBC - Abnormal; Notable for the following components:   Platelets 44 (*)    All other components within normal limits  URINALYSIS, ROUTINE W REFLEX MICROSCOPIC - Abnormal; Notable for the following components:   Color, Urine AMBER (*)    APPearance CLOUDY (*)    Hgb urine dipstick MODERATE (*)    Protein, ur 100 (*)    Leukocytes,Ua MODERATE (*)    RBC / HPF >50 (*)    WBC, UA >50 (*)    Bacteria, UA MANY (*)    All other components within normal limits  LIPASE, BLOOD   Radiology CT ABDOMEN PELVIS W CONTRAST  Result Date: 06/19/2020 CLINICAL DATA:  Left abdominal pain, no bowel movement in the past week EXAM: CT ABDOMEN AND PELVIS WITH CONTRAST TECHNIQUE: Multidetector CT imaging of the abdomen and pelvis was performed using the standard protocol following bolus administration of intravenous contrast. CONTRAST:  166mL OMNIPAQUE IOHEXOL 300 MG/ML  SOLN COMPARISON:  CT 08/26/2019 FINDINGS: Lower chest: Mild distal esophageal thickening. Stable cardiomegaly. No pericardial effusion. Minimal dependent atelectasis in the otherwise clear lung bases. Hepatobiliary: Slightly diminished in heterogeneous hepatic attenuation with a nodular surface contour. Worsening intra and extrahepatic biliary ductal dilatation albeit without visible gallstone or discernible obstructing mass lesion. Prior cholecystectomy. Pancreas: No pancreatic ductal dilatation or surrounding inflammatory changes. Spleen: Splenomegaly. Previously seen hypoattenuating focus in the spleen not well visualized. No concerning splenic lesion. Adrenals/Urinary Tract: Normal adrenals. Stable renal positioning with bilateral extrarenal pelves. There is some mild striations involving the upper pole left kidney. In combination with some mild diffuse urothelial thickening, appearance is concerning for possible ascending tract infection. Additional areas of cortical scarring in both kidneys. Stable soft tissue density in the expected  location of the native urinary bladder with channel extending to the skin surface in this patient with history of bladder exstrophy. Stomach/Bowel: Thickening of the distal thoracic esophagus. Stomach is grossly unremarkable accounting for underdistention. Duodenum with normal  sweep across the midline abdomen. No small bowel thickening or dilatation. No colonic dilatation or wall thickening. Scattered colonic diverticula without focal inflammation to suggest diverticulitis. Appendix is not visualized. Tiny air-filled appendix coiling about the cecal tip. Vascular/Lymphatic: Atherosclerotic calcifications within the abdominal aorta and branch vessels. No aneurysm or ectasia. Stable upper abdominal venous collaterals. Few prominent porta hepatis lymph nodes are unchanged from comparison including a 10 mm index lymph node (2/20). No new enlarged or enlarging adenopathy. Reproductive: Uterus is surgically absent. No concerning adnexal lesions. No concerning adnexal mass. Other: Extensive postsurgical changes of the anterior abdominal wall including a low midline urostomy. Stable supraumbilical ventral hernia and laxity of the left anterolateral abdominal wall, unchanged in appearance from comparison priors. Several loops of small bowel protrude into this outpouching without resulting obstruction or vascular compromise. Additional postsurgical changes and soft tissue thickening of the low anterior pelvic wall compatible with prior bladder exstrophy, as detailed above. Musculoskeletal: Chronic osseous deformity of the bony pelvis. Additional multilevel discogenic and facet degenerative changes throughout the spine with dextrocurvature of the lumbar levels. No acute or conspicuous osseous lesions. IMPRESSION: 1. No evidence of bowel obstruction. 2. Extensive postsurgical changes of the anterior abdominal wall including a low midline urostomy. Stable supraumbilical ventral hernia and laxity of the left anterolateral  abdominal wall, unchanged in appearance from comparison priors. Several loops of small bowel protrude into this outpouching without resulting obstruction or vascular compromise. 3. Mild distal esophageal thickening, nonspecific, but can be seen with esophagitis. 4. Worsening intra and extrahepatic biliary ductal dilatation albeit without visible gallstone or discernible obstructing mass lesion. Recommend correlation with liver function tests. If there is clinical concern for biliary obstruction, consider further evaluation with MRCP. 5. Stigmata of cirrhosis and portal hypertension including splenomegaly and upper abdominal venous collaterals. 6. Colonic diverticulosis without evidence of diverticulitis. 7. Aortic Atherosclerosis (ICD10-I70.0). Electronically Signed   By: Lovena Le M.D.   On: 06/19/2020 06:38    Procedures Procedures   Medications Ordered in ED Medications  sodium chloride 0.9 % bolus 1,000 mL (has no administration in time range)  morphine 4 MG/ML injection 4 mg (has no administration in time range)  ondansetron (ZOFRAN) injection 4 mg (has no administration in time range)    ED Course  I have reviewed the triage vital signs and the nursing notes.  Pertinent labs & imaging results that were available during my care of the patient were reviewed by me and considered in my medical decision making (see chart for details).  MDM Rules/Calculators/A&P Left-sided abdominal pain of uncertain cause.  Consider diverticulitis, small bowel obstruction, urolithiasis, pyelonephritis.  This is a differential that includes conditions with significant morbidity and mortality.  Will check screening labs and give IV fluids, morphine, ondansetron.  Will send for CT of abdomen and pelvis.  Old records reviewed, and she has several prior ED visits for chronic abdominal pain.  She had little relief with morphine.  Labs show hypokalemia, hyponatremia, mild elevation of transaminases and mildly  elevated bilirubin.  CT shows no definite acute process, specifically, no obstruction.  Some increased dilatation and hepatic ducts is noted and seems to correlate with labs.  However, she has had elevations in this range in the past.  She is given a dose of hydromorphone which she says is giving her better relief.  If pain can be adequately controlled, she should be able to be discharged.  Case is signed out to Dr. Tomi Bamberger.  Final Clinical Impression(s) / ED Diagnoses  Final diagnoses:  Chronic abdominal pain  Elevated liver function tests  Hypokalemia  Hyponatremia    Rx / DC Orders ED Discharge Orders    None       Delora Fuel, MD 28/83/37 [phone]

## 2020-06-19 NOTE — Telephone Encounter (Signed)
Patient has had 21 cancellations since 2018 and has not been seen since 2018. Patient has cancelled every appointment since 2018. Please advise how to proceed thanks

## 2020-06-19 NOTE — ED Triage Notes (Signed)
Pt states she is having L sided abdominal pain x 1 week. Pt believes she has "a blockage". States she has not had a BM in a week.

## 2020-06-19 NOTE — ED Provider Notes (Signed)
Pt states she is feeling better after her dose of dilaudid.   Pt has called her husband and would like to go home. Requests medications for constipation to take home.  Will rx golytely.  Already takes lactulose   Dorie Rank, MD 06/19/20 [id number]

## 2020-06-19 NOTE — Progress Notes (Deleted)
Referring Provider: Alycia Rossetti, MD Primary Care Physician:  Alycia Rossetti, MD Primary GI:  Dr. Abbey Chatters  No chief complaint on file.   HPI:   Kathleen Palmer is a 60 y.o. female who presents for follow-up on hepatitis.  The patient is not been seen by our office since 12/30/2016 when she was seen for hepatitis C, liver fibrosis.  At that time noted hepatitis C RNA approximately 18 million copies, genotype Ia.  History of remote IV drug use/cocaine but none since the 1980s.  Also no alcohol since 2004.  Noted significant fibrosis on ultrasound elastography (F3 to F4).  History of admission for suicidal ideations.  At that time noted recent meld score of 9, child Pugh class A.  At her last visit noted constipation and chronic abdominal pain due to multiple previous abdominal surgeries.  No overt hepatic symptoms.  Uses THC for chronic pain and she is from "[place] where it is legal."  Recommended update labs, Linzess 72 mcg daily, progress report, colonoscopy and upper endoscopy, referral to the liver clinic in [place], follow-up in our office in 3 months.  The patient had her first appointment with the [facility] liver care clinic on 02/02/2017.  Colonoscopy and endoscopy completed 01/27/2017.  Colonoscopy found redundant colon, diverticulosis in the rectosigmoid colon, sigmoid colon, descending colon, cecum.  Exam otherwise normal.  Recommended repeat in 10 years (2028).  Follow-up in 6 months.  Endoscopy found moderate gastritis status post biopsy, otherwise normal.  Surgical pathology found the biopsies to be fundic gland polyp and reactive/chemical gastritis without H. pylori.  Recommended repeat endoscopy in 2 to 3 years for variceal surveillance.  Most recent visit at the [facility] liver care clinic on 12/07/2019 and on file in epic in the media tab..  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  Meld score 8, child Pugh class A.  She continues with significant transaminitis in the setting of poorly controlled hypothyroidism in the past.  Due for updated EGD.  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  Noted complicated hepatitis C treatment situation with Vosevi (protease inhibitor) contraindicated with hepatic decompensation and NS5A resistence testing predicting resistance to all available DAA's.  Final visit recommendations included increase lactulose, start zinc supplementation, follow-up with abdominal surgery at [facility], follow-up with [facility] gastroenterology for repeat EGD, follow-up in the liver clinic in 4 to 6 months.  Query need for eventual liver transplant with careful surgical work-up due to previous multiple abdominal surgeries.  Today she states she is doing okay overall. ***EGD***  Past Medical History:  Diagnosis Date  . Angiomyolipoma    Left kidney  . Anxiety   . Arthritis   . Attention to urostomy [facility])   . Chronic pain   . Depression   . Hepatitis C    ? Contracted through IVDA  . Panic 04/29/1999  . PTSD (post-traumatic stress disorder)   . Substance abuse ([place])    Remote history- cocaine, ETOH, Marijuana  . Thyroid disease     Past Surgical History:  Procedure Laterality Date  . ABDOMINAL HYSTERECTOMY    . ABDOMINAL SURGERY    . BIOPSY  01/27/2017   Procedure: BIOPSY;  Surgeon: Danie Binder, MD;  [facility];  Service: Endoscopy;;  gastric  . CHOLECYSTECTOMY    . COLONOSCOPY WITH PROPOFOL N/A 01/27/2017   Procedure: COLONOSCOPY WITH PROPOFOL;  Surgeon: Danie Binder, MD;  [facility];  Service: Endoscopy;  Laterality: N/A;  12:00pm  . ESOPHAGOGASTRODUODENOSCOPY (EGD) WITH PROPOFOL N/A 01/27/2017   Procedure: ESOPHAGOGASTRODUODENOSCOPY (EGD) WITH PROPOFOL;  Surgeon: Danie Binder, MD;  [facility];  Service: Endoscopy;  Laterality:  N/A;  . HERNIA REPAIR     x2- abdomen  . ILEO LOOP CONDUIT    . 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 VAGINA RECONSTRUCTION SURGERY      Current Outpatient Medications  Medication Sig Dispense Refill  . Acetaminophen-Caffeine (EXCEDRIN ASPIRIN FREE PO) Take 2 tablets by mouth daily as needed.    . ALPRAZolam (XANAX) 1 MG tablet Take 1 tablet (1 mg total) by mouth 3 (three) times daily as needed for anxiety. 90 tablet 2  . Cyanocobalamin (VITAMIN B 12) 100 MCG LOZG Take by mouth.    Marland Kitchen FLUoxetine (PROZAC) 20 MG capsule Take 1 capsule (20 mg total) by mouth 2 (two) times daily. 60 capsule 2  . HYDROcodone-acetaminophen (NORCO) 5-325 MG tablet Take 1 tablet by mouth 2 (two) times daily as needed for moderate pain. 60 tablet 0  . lactulose (CHRONULAC) 10 GM/15ML solution Take 30 mLs (20 g total) by mouth 2 (two) times daily as needed for mild constipation. 1892 mL 3  . Multiple Vitamins-Minerals (CENTRUM PO) Take 1 tablet by mouth every evening.    . nitrofurantoin, macrocrystal-monohydrate, (MACROBID) 100 MG capsule Take 1 capsule (100 mg total) by mouth 2 (two) times daily. 14 capsule 0  . omeprazole (PRILOSEC) 20 MG capsule TAKE 1 CAPSULE BY MOUTH DAILY. FOR ACID REFLUX 90 capsule 1  . polyethylene glycol powder (GLYCOLAX) 17 GM/SCOOP powder Take 17g dissolved in 4 to 8 ounces of water once to twice daily 255 g 0  . potassium chloride SA (KLOR-CON) 20 MEQ tablet Take 1 tablet (20 mEq total) by mouth daily. FOR 3 DAYS 3 tablet 0  . traZODone (DESYREL) 100 MG tablet Take 2 tablets (200 mg total) by mouth at bedtime as needed for sleep. 180 tablet 1   No current facility-administered medications for this visit.    Allergies as of 06/19/2020 - Review Complete 06/19/2020  Allergen Reaction Noted  . Oxycodone Anxiety 03/05/2012  . Abilify [aripiprazole]  06/08/2013  . Bactrim  [sulfamethoxazole-trimethoprim]  12/01/2011  . Gabapentin  12/01/2011  . Ampicillin Rash 12/01/2011    Family History  Adopted: Yes  Problem Relation Age of Onset  . Depression Mother   . Depression Sister   . Anxiety disorder Sister   . Bipolar disorder Sister   . Depression Maternal Grandmother   . ADD / ADHD Neg Hx   . Alcohol abuse Neg Hx   . Drug abuse Neg Hx   . Dementia Neg Hx   . OCD Neg Hx   . Paranoid behavior Neg Hx   . Schizophrenia Neg Hx   . Seizures Neg Hx   . Sexual abuse Neg Hx   . Physical abuse Neg Hx   . Suicidality Neg Hx   . Colon cancer Neg Hx     Social History   Socioeconomic History  . Marital status: Married    Spouse name: Not on file  . Number of children: 0  . Years of education: Not on file  . Highest education level: Not on file  Occupational History  . Occupation: disability  Tobacco Use  . Smoking status: Former Smoker    Packs/day: 0.25    Years: 33.00    Pack years: 8.25    Types: Cigarettes    Quit date: 01/21/2015    Years since quitting: 5.4  . Smokeless tobacco: Never Used  . Tobacco comment: 9-10 cigs a day as of 10/20/2012, (02-07-15 per pt, she stopped smoking 01-21-15)  Vaping Use  . Vaping Use: Never used  Substance and Sexual Activity  . Alcohol use: No    Alcohol/week: 0.0 standard drinks  . Drug use: Not Currently    Types: Marijuana    Comment: quit 10 days   . Sexual activity: Yes    Birth control/protection: Surgical  Other Topics Concern  . Not on file  Social History Narrative  . Not on file   Social Determinants of Health   Financial Resource Strain: Not on file  Food Insecurity: Not on file  Transportation Needs: Not on file  Physical Activity: Not on file  Stress: Not on file  Social Connections: Not on file    Subjective:*** Review of Systems  Constitutional: Negative for chills, fever, malaise/fatigue and weight loss.  HENT: Negative for congestion and sore throat.   Respiratory:  Negative for cough and shortness of breath.   Cardiovascular: Negative for chest pain and palpitations.  Gastrointestinal: Negative for abdominal pain, blood in stool, diarrhea, melena, nausea and vomiting.  Musculoskeletal: Negative for joint pain and myalgias.  Skin: Negative for rash.  Neurological: Negative for dizziness and weakness.  Endo/Heme/Allergies: Does not bruise/bleed easily.  Psychiatric/Behavioral: Negative for depression. The patient is not nervous/anxious.   All other systems reviewed and are negative.    Objective: There were no vitals taken for this visit. Physical Exam Vitals and nursing note reviewed.  Constitutional:      General: She is not in acute distress.    Appearance: Normal appearance. She is well-developed. She is not ill-appearing, toxic-appearing or diaphoretic.  HENT:     Head: Normocephalic and atraumatic.     Nose: No congestion or rhinorrhea.  Eyes:     General: No scleral icterus. Cardiovascular:     Rate and Rhythm: Normal rate and regular rhythm.     Heart sounds: Normal heart sounds.  Pulmonary:     Effort: Pulmonary effort is normal. No respiratory distress.     Breath sounds: Normal breath sounds.  Abdominal:     General: Bowel sounds are normal.     Palpations: Abdomen is soft. There is no hepatomegaly, splenomegaly or mass.     Tenderness: There is no abdominal tenderness. There is no guarding or rebound.     Hernia: No hernia is present.  Skin:    General: Skin is warm and dry.     Coloration: Skin is not jaundiced.     Findings: No rash.  Neurological:     General: No focal deficit present.     Mental Status: She is alert and oriented to person, place, and time.  Psychiatric:        Attention and Perception: Attention normal.        Mood and Affect: Mood normal.        Speech: Speech normal.        Behavior: Behavior normal.        Thought Content: Thought content normal.        Cognition and Memory: Cognition and memory  normal.      Assessment:  ***   Plan: ***  Thank you for allowing [place] to participate in the care of MAKIRA HOLLEMAN  Walden Field, DNP, AGNP-C Adult & Gerontological Nurse Practitioner [facility]   06/19/2020 8:17 AM   Disclaimer: This note was dictated with voice recognition software. Similar sounding words can inadvertently be transcribed and may not be corrected upon review.

## 2020-06-19 NOTE — Telephone Encounter (Signed)
Patient was told that if she needed another appointment we would need another referral from her pcp, she has rescheduled since 2018

## 2020-06-19 NOTE — Discharge Instructions (Addendum)
Follow up with your primary care doctor.

## 2020-06-20 ENCOUNTER — Telehealth: Payer: Self-pay

## 2020-06-20 ENCOUNTER — Encounter (INDEPENDENT_AMBULATORY_CARE_PROVIDER_SITE_OTHER): Payer: Self-pay | Admitting: *Deleted

## 2020-06-20 NOTE — Telephone Encounter (Signed)
Routing to [place] for discharge.

## 2020-06-20 NOTE — Telephone Encounter (Signed)
Transition Care Management Unsuccessful Follow-up Telephone Call  Date of discharge and from where:  06/19/2020 from [facility]  Attempts:  1st Attempt  Reason for unsuccessful TCM follow-up call:  Voice mail full

## 2020-06-21 NOTE — Telephone Encounter (Signed)
Transition Care Management Unsuccessful Follow-up Telephone Call  Date of discharge and from where:  06/19/2020 from [facility]   Attempts:  2nd Attempt  Reason for unsuccessful TCM follow-up call:  Voice mail full

## 2020-06-22 NOTE — Telephone Encounter (Signed)
Transition Care Management Unsuccessful Follow-up Telephone Call  Date of discharge and from where:  06/19/2020 from [facility]  Attempts:  3rd Attempt  Reason for unsuccessful TCM follow-up call:  Unable to reach patient

## 2020-06-25 ENCOUNTER — Encounter: Payer: Self-pay | Admitting: *Deleted

## 2020-06-25 NOTE — Telephone Encounter (Signed)
Letter typed and  mailed to patient.

## 2020-06-26 ENCOUNTER — Other Ambulatory Visit: Payer: Self-pay | Admitting: *Deleted

## 2020-06-26 NOTE — Telephone Encounter (Signed)
Ok to refill??  Last office visit 09/02/2019.  Last refill 05/29/2020.

## 2020-06-27 ENCOUNTER — Telehealth: Payer: Self-pay | Admitting: Internal Medicine

## 2020-06-27 MED ORDER — HYDROCODONE-ACETAMINOPHEN 5-325 MG PO TABS: 1.0000 | ORAL_TABLET | Freq: Two times a day (BID) | ORAL | 0 refills | Status: DC | PRN

## 2020-06-27 NOTE — Telephone Encounter (Signed)
I have marked her as dismissed.

## 2020-06-27 NOTE — Telephone Encounter (Signed)
PATIENT NEEDS TO BE MARKED AS DISCHARGED FROM PRACTICE

## 2020-07-03 ENCOUNTER — Other Ambulatory Visit: Payer: Self-pay

## 2020-07-03 ENCOUNTER — Encounter (HOSPITAL_COMMUNITY): Payer: Self-pay | Admitting: Psychiatry

## 2020-07-03 ENCOUNTER — Telehealth (INDEPENDENT_AMBULATORY_CARE_PROVIDER_SITE_OTHER): Payer: Medicare Other | Admitting: Psychiatry

## 2020-07-03 DIAGNOSIS — F4310 Post-traumatic stress disorder, unspecified: Secondary | ICD-10-CM | POA: Diagnosis not present

## 2020-07-03 DIAGNOSIS — F332 Major depressive disorder, recurrent severe without psychotic features: Secondary | ICD-10-CM

## 2020-07-03 MED ORDER — FLUOXETINE HCL 20 MG PO CAPS: 20.0000 mg | ORAL_CAPSULE | Freq: Two times a day (BID) | ORAL | 2 refills | Status: AC

## 2020-07-03 MED ORDER — TRAZODONE HCL 100 MG PO TABS: 200.0000 mg | ORAL_TABLET | Freq: Every evening | ORAL | 1 refills | Status: AC | PRN

## 2020-07-03 MED ORDER — ALPRAZOLAM 1 MG PO TABS: 1.0000 mg | ORAL_TABLET | Freq: Three times a day (TID) | ORAL | 2 refills | Status: AC | PRN

## 2020-07-03 NOTE — Progress Notes (Signed)
Virtual Visit via Telephone Note  I connected with Kathleen Palmer on 07/03/20 at 10:40 AM EST by telephone and verified that I am speaking with the correct person using two identifiers.  Location: Patient: home Provider: office   I discussed the limitations, risks, security and privacy concerns of performing an evaluation and management service by telephone and the availability of in person appointments. I also discussed with the patient that there may be a patient responsible charge related to this service. The patient expressed understanding and agreed to proceed.     I discussed the assessment and treatment plan with the patient. The patient was provided an opportunity to ask questions and all were answered. The patient agreed with the plan and demonstrated an understanding of the instructions.   The patient was advised to call back or seek an in-person evaluation if the symptoms worsen or if the condition fails to improve as anticipated.  I provided 15 minutes of non-face-to-face time during this encounter.   Levonne Spiller, MD  [facility] MD/PA/NP OP Progress Note  07/03/2020 10:55 AM Kathleen Palmer  MRN:  [id number]  Chief Complaint: depression, anxiety HPI: This patient is a 60 year old female who lives with her husband in [place].  She is on disability.  She has a history of depression, anxiety, posttraumatic stress disorder as well as borderline traits.  The patient returns for follow-up after 2 months.  She has been having a lot more abdominal pain.  She was seen in the ED on 06/19/2020.  She does have a ventral hernia that she has not yet taken care of surgically.  She states that she is afraid to do much because she was in so much pain as a child when she had her urostomy put in.  Nevertheless she is seeing her primary doctor this week to discuss all of this.  She states that mentally she is doing fairly well and denies severe depression but the pain is getting her down is decreasing her  energy.  She is still sleeping well.  Xanax continues to help her anxiety.  She denies any thoughts of self-harm or suicide and denies crying spells. Visit Diagnosis:    ICD-10-CM   1. Major depressive disorder, recurrent episode, severe with anxious distress ([place])  F33.2   2. PTSD (post-traumatic stress disorder)  F43.10     Past Psychiatric History: Several previous hospitalizations for depression and suicidal ideation, the last 1 being about 2 years ago  Past Medical History:  Past Medical History:  Diagnosis Date   Angiomyolipoma    Left kidney   Anxiety    Arthritis    Attention to urostomy [facility])    Chronic pain    Depression    Hepatitis C    ? Contracted through IVDA   Panic 04/29/1999   PTSD (post-traumatic stress disorder)    Substance abuse ([place])    Remote history- cocaine, ETOH, Marijuana   Thyroid disease     Past Surgical History:  Procedure Laterality Date   ABDOMINAL HYSTERECTOMY     ABDOMINAL SURGERY     BIOPSY  01/27/2017   Procedure: BIOPSY;  Surgeon: Danie Binder, MD;  [facility];  Service: Endoscopy;;  gastric   CHOLECYSTECTOMY     COLONOSCOPY WITH PROPOFOL N/A 01/27/2017   Procedure: COLONOSCOPY WITH PROPOFOL;  Surgeon: Danie Binder, MD;  [facility];  Service: Endoscopy;  Laterality: N/A;  12:00pm   ESOPHAGOGASTRODUODENOSCOPY (EGD) WITH PROPOFOL N/A 01/27/2017  Procedure: ESOPHAGOGASTRODUODENOSCOPY (EGD) WITH PROPOFOL;  Surgeon: Danie Binder, MD;  [facility];  Service: Endoscopy;  Laterality: N/A;   HERNIA REPAIR     x2- abdomen   ILEO LOOP CONDUIT     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   orthopedic surgeries     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, pelvic deformities   VAGINA RECONSTRUCTION SURGERY      Family Psychiatric History: see below  Family History:  Family History  Adopted: Yes  Problem Relation Age of Onset   Depression Mother     Depression Sister    Anxiety disorder Sister    Bipolar disorder Sister    Depression Maternal Grandmother    ADD / ADHD Neg Hx    Alcohol abuse Neg Hx    Drug abuse Neg Hx    Dementia Neg Hx    OCD Neg Hx    Paranoid behavior Neg Hx    Schizophrenia Neg Hx    Seizures Neg Hx    Sexual abuse Neg Hx    Physical abuse Neg Hx    Suicidality Neg Hx    Colon cancer Neg Hx     Social History:  Social History   Socioeconomic History   Marital status: Married    Spouse name: Not on file   Number of children: 0   Years of education: Not on file   Highest education level: Not on file  Occupational History   Occupation: disability  Tobacco Use   Smoking status: Former Smoker    Packs/day: 0.25    Years: 33.00    Pack years: 8.25    Types: Cigarettes    Quit date: 01/21/2015    Years since quitting: 5.4   Smokeless tobacco: Never Used   Tobacco comment: 9-10 cigs a day as of 10/20/2012, (02-07-15 per pt, she stopped smoking 01-21-15)  Vaping Use   Vaping Use: Never used  Substance and Sexual Activity   Alcohol use: No    Alcohol/week: 0.0 standard drinks   Drug use: Not Currently    Types: Marijuana    Comment: quit 10 days    Sexual activity: Yes    Birth control/protection: Surgical  Other Topics Concern   Not on file  Social History Narrative   Not on file   Social Determinants of Health   Financial Resource Strain: Not on file  Food Insecurity: Not on file  Transportation Needs: Not on file  Physical Activity: Not on file  Stress: Not on file  Social Connections: Not on file    Allergies:  Allergies  Allergen Reactions   Oxycodone Anxiety   Abilify [Aripiprazole]     Stiff neck   Bactrim [Sulfamethoxazole-Trimethoprim]     Tongue swelled   Gabapentin     Dizziness to the point she actually fell   Ampicillin Rash    Has patient had a PCN reaction causing immediate rash, facial/tongue/throat swelling, SOB or  lightheadedness with hypotension: Unknown Has patient had a PCN reaction causing severe rash involving mucus membranes or skin necrosis: Unknown Has patient had a PCN reaction that required hospitalization: No Has patient had a PCN reaction occurring within the last 10 years: No If all of the above answers are "NO", then may proceed with Cephalosporin use.      Metabolic Disorder Labs: Lab Results  Component Value Date   HGBA1C 5.2 03/10/2018   MPG 102.54 03/10/2018   MPG 102.54 12/05/2016  No results found for: PROLACTIN Lab Results  Component Value Date   CHOL 123 01/14/2019   TRIG 90 01/14/2019   HDL 36 (L) 01/14/2019   CHOLHDL 3.4 01/14/2019   VLDL 16 03/10/2018   LDLCALC 70 01/14/2019   LDLCALC 84 03/10/2018   Lab Results  Component Value Date   TSH 0.87 01/14/2019   TSH 14.938 (H) 03/10/2018    Therapeutic Level Labs: No results found for: LITHIUM No results found for: VALPROATE No components found for:  CBMZ  Current Medications: Current Outpatient Medications  Medication Sig Dispense Refill   Acetaminophen-Caffeine (EXCEDRIN ASPIRIN FREE PO) Take 2 tablets by mouth daily as needed.     ALPRAZolam (XANAX) 1 MG tablet Take 1 tablet (1 mg total) by mouth 3 (three) times daily as needed for anxiety. 90 tablet 2   Cyanocobalamin (VITAMIN B 12) 100 MCG LOZG Take by mouth.     FLUoxetine (PROZAC) 20 MG capsule Take 1 capsule (20 mg total) by mouth 2 (two) times daily. 60 capsule 2   HYDROcodone-acetaminophen (NORCO) 5-325 MG tablet Take 1 tablet by mouth 2 (two) times daily as needed for moderate pain. 60 tablet 0   lactulose (CHRONULAC) 10 GM/15ML solution Take 30 mLs (20 g total) by mouth 2 (two) times daily as needed for mild constipation. 1892 mL 3   Multiple Vitamins-Minerals (CENTRUM PO) Take 1 tablet by mouth every evening.     nitrofurantoin, macrocrystal-monohydrate, (MACROBID) 100 MG capsule Take 1 capsule (100 mg total) by mouth 2 (two) times  daily. 14 capsule 0   omeprazole (PRILOSEC) 20 MG capsule TAKE 1 CAPSULE BY MOUTH DAILY. FOR ACID REFLUX 90 capsule 1   polyethylene glycol powder (GLYCOLAX) 17 GM/SCOOP powder Take 17g dissolved in 4 to 8 ounces of water once to twice daily 255 g 0   potassium chloride SA (KLOR-CON) 20 MEQ tablet Take 1 tablet (20 mEq total) by mouth daily. FOR 3 DAYS 3 tablet 0   traZODone (DESYREL) 100 MG tablet Take 2 tablets (200 mg total) by mouth at bedtime as needed for sleep. 180 tablet 1   No current facility-administered medications for this visit.     Musculoskeletal: Strength & Muscle Tone: within normal limits Gait & Station: normal Patient leans: N/A  Psychiatric Specialty Exam: Review of Systems  Constitutional: Positive for fatigue.  Gastrointestinal: Positive for abdominal pain.  All other systems reviewed and are negative.   There were no vitals taken for this visit.There is no height or weight on file to calculate BMI.  General Appearance: NA  Eye Contact:  NA  Speech:  Clear and Coherent  Volume:  Normal  Mood:  Euthymic  Affect:  NA  Thought Process:  Goal Directed  Orientation:  Full (Time, Place, and Person)  Thought Content: Rumination   Suicidal Thoughts:  No  Homicidal Thoughts:  No  Memory:  Immediate;   Good Recent;   Good Remote;   Good  Judgement:  Good  Insight:  Fair  Psychomotor Activity:  Decreased  Concentration:  Concentration: Good and Attention Span: Good  Recall:  Good  Fund of Knowledge: Good  Language: Good  Akathisia:  No  Handed:  Right  AIMS (if indicated): not done  Assets:  Communication Skills Desire for Improvement Resilience Social Support Talents/Skills  ADL's:  Intact  Cognition: WNL  Sleep:  Good   Screenings: AIMS   Flowsheet Row Admission (Discharged) from OP Visit from 03/10/2018 in [place] [facility] Admission (  Discharged) from 12/03/2016 in [place] [facility]   AIMS Total Score 0 0    AUDIT   Flowsheet Row Admission (Discharged) from OP Visit from 03/10/2018 in [place] [facility] Admission (Discharged) from 12/03/2016 in [place] [facility]  Alcohol Use Disorder Identification Test Final Score (AUDIT) 0 0    MDI   Flowsheet Row Office Visit from 11/15/2015 in [facility]  Total Score (max 50) 24    PHQ2-9   Flowsheet Row Video Visit from 07/03/2020 in [place] Office Visit from 09/02/2019 in [place] Office Visit from 01/14/2019 in [place] Office Visit from 07/07/2018 in [place] Office Visit from 10/27/2017 in [place]  PHQ-2 Total Score 2 2 2  0 0  PHQ-9 Total Score 7 3 8  -- --    Flowsheet Row Video Visit from 07/03/2020 in [place] ED from 06/19/2020 in [place] Admission (Discharged) from OP Visit from 03/10/2018 in [facility] [place] [facility]  C-SSRS RISK CATEGORY No Risk No Risk High Risk       Assessment and Plan: This patient is a 60 year old female with a history of PTSD depression and anxiety.  She is stable on her current regimen.  She will continue Prozac 40 mg daily for depression, trazodone 200 mg at bedtime for sleep and Xanax 1 mg 3 times daily as needed for anxiety.  She will return to see me in 4 weeks at her request   Levonne Spiller, MD 07/03/2020, 10:55 AM

## 2020-07-05 DIAGNOSIS — K7469 Other cirrhosis of liver: Secondary | ICD-10-CM | POA: Diagnosis not present

## 2020-07-05 DIAGNOSIS — K7290 Hepatic failure, unspecified without coma: Secondary | ICD-10-CM | POA: Diagnosis not present

## 2020-07-05 DIAGNOSIS — B182 Chronic viral hepatitis C: Secondary | ICD-10-CM | POA: Diagnosis not present

## 2020-07-05 DIAGNOSIS — R109 Unspecified abdominal pain: Secondary | ICD-10-CM | POA: Diagnosis not present

## 2020-07-06 ENCOUNTER — Encounter: Payer: Self-pay | Admitting: Family Medicine

## 2020-07-06 ENCOUNTER — Ambulatory Visit (INDEPENDENT_AMBULATORY_CARE_PROVIDER_SITE_OTHER): Payer: Medicare Other | Admitting: Family Medicine

## 2020-07-06 ENCOUNTER — Other Ambulatory Visit: Payer: Self-pay

## 2020-07-06 VITALS — BP 132/78 | HR 74 | Temp 98.2°F | Resp 16 | Ht 63.0 in | Wt 162.0 lb

## 2020-07-06 DIAGNOSIS — Q6410 Exstrophy of urinary bladder, unspecified: Secondary | ICD-10-CM

## 2020-07-06 DIAGNOSIS — N390 Urinary tract infection, site not specified: Secondary | ICD-10-CM | POA: Diagnosis not present

## 2020-07-06 DIAGNOSIS — K7460 Unspecified cirrhosis of liver: Secondary | ICD-10-CM | POA: Diagnosis not present

## 2020-07-06 DIAGNOSIS — K209 Esophagitis, unspecified without bleeding: Secondary | ICD-10-CM | POA: Diagnosis not present

## 2020-07-06 DIAGNOSIS — E038 Other specified hypothyroidism: Secondary | ICD-10-CM | POA: Diagnosis not present

## 2020-07-06 DIAGNOSIS — I700 Atherosclerosis of aorta: Secondary | ICD-10-CM | POA: Diagnosis not present

## 2020-07-06 DIAGNOSIS — R319 Hematuria, unspecified: Secondary | ICD-10-CM

## 2020-07-06 DIAGNOSIS — K5900 Constipation, unspecified: Secondary | ICD-10-CM | POA: Diagnosis not present

## 2020-07-06 DIAGNOSIS — E876 Hypokalemia: Secondary | ICD-10-CM | POA: Diagnosis not present

## 2020-07-06 DIAGNOSIS — G894 Chronic pain syndrome: Secondary | ICD-10-CM | POA: Diagnosis not present

## 2020-07-06 DIAGNOSIS — N820 Vesicovaginal fistula: Secondary | ICD-10-CM

## 2020-07-06 DIAGNOSIS — Z932 Ileostomy status: Secondary | ICD-10-CM

## 2020-07-06 MED ORDER — CIPROFLOXACIN HCL 500 MG PO TABS: 500.0000 mg | ORAL_TABLET | Freq: Two times a day (BID) | ORAL | 0 refills | Status: AC

## 2020-07-06 MED ORDER — HYDROCODONE-ACETAMINOPHEN 5-325 MG PO TABS: 1.0000 | ORAL_TABLET | Freq: Two times a day (BID) | ORAL | 0 refills | Status: AC | PRN

## 2020-07-06 NOTE — Assessment & Plan Note (Signed)
Given prescription for April via hard script

## 2020-07-06 NOTE — Progress Notes (Signed)
Subjective:    Patient ID: Kathleen Palmer, female    DOB: Feb 21, 1961, 60 y.o.   MRN: [id number]  Patient presents for  ER F/U (Increased pain, blood in ostomy) Patient here to follow-up chronic medical problems.  Medications reviewed.  Her last visit was in May 2021.  At her last visit she was referred to pain management for chronic pain syndrome present multiple pelvic surgeries and abdominal surgery for also chronic pain.  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 She has not followed up with urology, she was in ER recently with abd pain, CT scan showed possible ascending inflammation but otherwise nothing acute, given fluids, dilaudid and discharged CT also showed her known  esophagitis. She was referred back to GI, but they refused to see her because of multiple No shows. Today she states she has very dark urine in her urostomy bag no fever, no chills, no change in BM   She is following iver clinic at [facility].  She has cirrhosis of the liver and history of Hep C   She went to liver clinic yesterday at [place] they told her to f/u in 6 months and to get established with [facility]  There was also concern for seizure-like activity at last visit and she was referred to neurology for evaluation.  She does continue to follow-up with her psychiatrist [name] who prescribes her medications, she is on Xanax, trazodone, prozac    CT abd/pelvis   1 . No evidence of bowel obstruction. 2. Extensive postsurgical changes of the anterior abdominal wall including a low midline urostomy. Stable supraumbilical ventral hernia and laxity of the left anterolateral abdominal wall, unchanged in appearance from comparison priors. Several loops of small bowel protrude into this outpouching without resulting obstruction or  vascular compromise. 3. Mild distal esophageal thickening, nonspecific, but can be seen with esophagitis. 4. Worsening intra and extrahepatic biliary ductal dilatation albeit without visible gallstone or discernible obstructing mass lesion. Recommend correlation with liver function tests. If there is clinical concern for biliary obstruction, consider further evaluation with MRCP. 5. Stigmata of cirrhosis and portal hypertension including splenomegaly and upper abdominal venous collaterals. 6. Colonic diverticulosis without evidence of diverticulitis. 7. Aortic Atherosclerosis (ICD10-I70.0).   Review Of Systems:  GEN- + fatigue, fever, weight loss,weakness, recent illness HEENT- denies eye drainage, change in vision, nasal discharge, CVS- denies chest pain, palpitations RESP- denies SOB, cough, wheeze ABD- denies N/V, change in stools, +abd pain GU- denies dysuria,+ hematuria, dribbling, incontinence MSK- denies joint pain, muscle aches, injury Neuro- denies headache, dizziness, syncope, seizure activity       Objective:    BP 132/78   Pulse 74   Temp 98.2 F (36.8 C) (Temporal)   Resp 16   Ht 5\' 3"  (1.6 m)   Wt 162 lb (73.5 kg)   SpO2 96%   BMI 28.70 kg/m  GEN- NAD, alert and oriented x3 HEENT- PERRL, EOMI, non injected sclera, pink conjunctiva, MMM, oropharynx clear Neck- Supple, no thyromegaly CVS- RRR, no murmur RESP-CTAB ABD-NABS,soft, ND, Urostomy in place, mild TTP across upper abdomen, no CVA tenderness, ventral hernia at left sided abdominal wall scar  NEURO-CNII-XII in tact no focal deficit  Psych- tearful, anxious appearing , no apparent hallucinations, normal speech  EXT- No edema Pulses- Radial, DP- 2+  Assessment & Plan:    I am leaving practice today, pt advised needs to find new PCP   Problem List Items Addressed This Visit      Unprioritized   Aortic atherosclerosis ([place])    No statin drug, she has not followed up well Has cirrhosis  of liver       Bladder exstrophy    Has urostomy, UA concerning with infection has tea colored urine Push fluids Given Cipro today  Recheck K and renal function Will refer to urology and GI at [facility], long discussion with patient regarding need to f/u with specialist to assist her      Relevant Orders   Ambulatory referral to Urology   Chronic pain syndrome    Given prescription for April via hard script       Relevant Medications   HYDROcodone-acetaminophen ([place]) 5-325 MG tablet   Constipation   Relevant Orders   Ambulatory referral to Gastroenterology   Esophagitis    On PPI, with cirrhosis, likely needs EGD for varices f/u       Relevant Orders   Ambulatory referral to Gastroenterology   Ileostomy status [facility])   Relevant Orders   Ambulatory referral to Urology   Subclinical hypothyroidism    Recheck TFT       Relevant Orders   TSH   T3, free   T4, free   Vesico-vaginal fistula   Relevant Orders   Ambulatory referral to Urology    Other Visit Diagnoses    Hypokalemia    -  Primary   Relevant Orders   Basic metabolic panel   Urinary tract infection with hematuria, site unspecified       Cirrhosis of liver without ascites, unspecified hepatic cirrhosis type [facility])       Relevant Orders   Ambulatory referral to Gastroenterology      Note: This dictation was prepared with Dragon dictation along with smaller phrase technology. Any transcriptional errors that result from this process are unintentional.

## 2020-07-06 NOTE — Assessment & Plan Note (Signed)
No statin drug, she has not followed up well Has cirrhosis of liver

## 2020-07-06 NOTE — Patient Instructions (Addendum)
Referral to new GI  Referral to new urologist Take antibiotics as prescribed

## 2020-07-06 NOTE — Assessment & Plan Note (Signed)
Recheck TFT 

## 2020-07-06 NOTE — Assessment & Plan Note (Signed)
Has urostomy, UA concerning with infection has tea colored urine Push fluids Given Cipro today  Recheck K and renal function Will refer to urology and GI at [facility], long discussion with patient regarding need to f/u with specialist to assist her

## 2020-07-06 NOTE — Assessment & Plan Note (Signed)
On PPI, with cirrhosis, likely needs EGD for varices f/u

## 2020-07-07 LAB — BASIC METABOLIC PANEL
BUN: 7 mg/dL (ref 7–25)
CO2: 28 mmol/L (ref 20–32)
Calcium: 8.8 mg/dL (ref 8.6–10.4)
Chloride: 102 mmol/L (ref 98–110)
Creat: 0.72 mg/dL (ref 0.50–0.99)
Glucose, Bld: 95 mg/dL (ref 65–99)
Potassium: 3.5 mmol/L (ref 3.5–5.3)
Sodium: 139 mmol/L (ref 135–146)

## 2020-07-07 LAB — T4, FREE: Free T4: 0.9 ng/dL (ref 0.8–1.8)

## 2020-07-07 LAB — TSH: TSH: 3.61 mIU/L (ref 0.40–4.50)

## 2020-07-07 LAB — T3, FREE: T3, Free: 2.2 pg/mL — ABNORMAL LOW (ref 2.3–4.2)

## 2020-07-24 ENCOUNTER — Telehealth: Payer: Self-pay | Admitting: *Deleted

## 2020-07-24 DIAGNOSIS — I469 Cardiac arrest, cause unspecified: Secondary | ICD-10-CM | POA: Diagnosis not present

## 2020-07-24 DIAGNOSIS — R404 Transient alteration of awareness: Secondary | ICD-10-CM | POA: Diagnosis not present

## 2020-07-24 NOTE — Telephone Encounter (Signed)
Received faxed death certificate for patient for cremation purposes form [facility].   Call placed to funeral home to inquire.   Reports that EMS was contacted to patient home on 07/01/2020. States that patient spouse felt she had seizure like activity and that prompted him to contact EMS.   EMS contacted medical examiner on call prior to transport to [facility]. Dr. Konrad Dolores noted to be on call [phone]~ telephone.

## 2020-07-25 NOTE — Telephone Encounter (Signed)
Faxed completed form to [facility].   Awaiting hard copy.

## 2020-07-27 DEATH — deceased
